# Patient Record
Sex: Male | Born: 1940
Health system: Southern US, Community
[De-identification: ages and names within clinical notes are randomized; demographics above are authoritative.]

## PROBLEM LIST (undated history)

## (undated) DIAGNOSIS — I058 Other rheumatic mitral valve diseases: Secondary | ICD-10-CM

## (undated) DIAGNOSIS — R011 Cardiac murmur, unspecified: Secondary | ICD-10-CM

## (undated) DIAGNOSIS — N189 Chronic kidney disease, unspecified: Secondary | ICD-10-CM

## (undated) DIAGNOSIS — C61 Malignant neoplasm of prostate: Secondary | ICD-10-CM

## (undated) DIAGNOSIS — Z8489 Family history of other specified conditions: Secondary | ICD-10-CM

## (undated) DIAGNOSIS — I5189 Other ill-defined heart diseases: Secondary | ICD-10-CM

## (undated) DIAGNOSIS — I429 Cardiomyopathy, unspecified: Secondary | ICD-10-CM

## (undated) DIAGNOSIS — N2889 Other specified disorders of kidney and ureter: Secondary | ICD-10-CM

## (undated) DIAGNOSIS — I350 Nonrheumatic aortic (valve) stenosis: Secondary | ICD-10-CM

## (undated) DIAGNOSIS — F0390 Unspecified dementia without behavioral disturbance: Secondary | ICD-10-CM

## (undated) DIAGNOSIS — T8203XD Leakage of heart valve prosthesis, subsequent encounter: Secondary | ICD-10-CM

## (undated) DIAGNOSIS — D589 Hereditary hemolytic anemia, unspecified: Secondary | ICD-10-CM

## (undated) DIAGNOSIS — Z953 Presence of xenogenic heart valve: Secondary | ICD-10-CM

## (undated) DIAGNOSIS — G459 Transient cerebral ischemic attack, unspecified: Secondary | ICD-10-CM

## (undated) DIAGNOSIS — I442 Atrioventricular block, complete: Secondary | ICD-10-CM

## (undated) DIAGNOSIS — E7800 Pure hypercholesterolemia, unspecified: Secondary | ICD-10-CM

## (undated) DIAGNOSIS — I779 Disorder of arteries and arterioles, unspecified: Secondary | ICD-10-CM

## (undated) DIAGNOSIS — I10 Essential (primary) hypertension: Secondary | ICD-10-CM

## (undated) DIAGNOSIS — I639 Cerebral infarction, unspecified: Secondary | ICD-10-CM

## (undated) DIAGNOSIS — R911 Solitary pulmonary nodule: Secondary | ICD-10-CM

## (undated) DIAGNOSIS — T8203XA Leakage of heart valve prosthesis, initial encounter: Secondary | ICD-10-CM

## (undated) DIAGNOSIS — I739 Peripheral vascular disease, unspecified: Secondary | ICD-10-CM

## (undated) DIAGNOSIS — I2510 Atherosclerotic heart disease of native coronary artery without angina pectoris: Secondary | ICD-10-CM

## (undated) DIAGNOSIS — R7303 Prediabetes: Secondary | ICD-10-CM

## (undated) HISTORY — DX: Cardiac murmur, unspecified: R01.1

## (undated) HISTORY — DX: Pure hypercholesterolemia, unspecified: E78.00

## (undated) HISTORY — DX: Cerebral infarction, unspecified: I63.9

## (undated) HISTORY — DX: Malignant neoplasm of prostate: C61

## (undated) HISTORY — DX: Other specified disorders of kidney and ureter: N28.89

## (undated) HISTORY — DX: Leakage of heart valve prosthesis, subsequent encounter: T82.03XD

## (undated) HISTORY — DX: Hereditary hemolytic anemia, unspecified: D58.9

## (undated) HISTORY — DX: Cardiomyopathy, unspecified: I42.9

## (undated) HISTORY — DX: Leakage of heart valve prosthesis, initial encounter: T82.03XA

## (undated) HISTORY — DX: Solitary pulmonary nodule: R91.1

## (undated) HISTORY — DX: Essential (primary) hypertension: I10

---

## 2012-01-12 DIAGNOSIS — E782 Mixed hyperlipidemia: Secondary | ICD-10-CM | POA: Diagnosis not present

## 2012-01-12 DIAGNOSIS — R011 Cardiac murmur, unspecified: Secondary | ICD-10-CM | POA: Diagnosis not present

## 2012-01-12 DIAGNOSIS — I10 Essential (primary) hypertension: Secondary | ICD-10-CM | POA: Diagnosis not present

## 2012-01-25 DIAGNOSIS — R011 Cardiac murmur, unspecified: Secondary | ICD-10-CM | POA: Diagnosis not present

## 2012-10-31 DIAGNOSIS — I10 Essential (primary) hypertension: Secondary | ICD-10-CM | POA: Diagnosis not present

## 2012-10-31 DIAGNOSIS — R413 Other amnesia: Secondary | ICD-10-CM | POA: Diagnosis not present

## 2013-01-30 DIAGNOSIS — I10 Essential (primary) hypertension: Secondary | ICD-10-CM | POA: Diagnosis not present

## 2013-01-30 DIAGNOSIS — E782 Mixed hyperlipidemia: Secondary | ICD-10-CM | POA: Diagnosis not present

## 2013-01-30 DIAGNOSIS — E1129 Type 2 diabetes mellitus with other diabetic kidney complication: Secondary | ICD-10-CM | POA: Diagnosis not present

## 2013-03-18 DIAGNOSIS — E119 Type 2 diabetes mellitus without complications: Secondary | ICD-10-CM | POA: Diagnosis not present

## 2013-05-02 DIAGNOSIS — E1129 Type 2 diabetes mellitus with other diabetic kidney complication: Secondary | ICD-10-CM | POA: Diagnosis not present

## 2013-05-02 DIAGNOSIS — I10 Essential (primary) hypertension: Secondary | ICD-10-CM | POA: Diagnosis not present

## 2013-05-02 DIAGNOSIS — R635 Abnormal weight gain: Secondary | ICD-10-CM | POA: Diagnosis not present

## 2013-05-02 DIAGNOSIS — E782 Mixed hyperlipidemia: Secondary | ICD-10-CM | POA: Diagnosis not present

## 2013-05-02 DIAGNOSIS — R413 Other amnesia: Secondary | ICD-10-CM | POA: Diagnosis not present

## 2013-08-22 DIAGNOSIS — I10 Essential (primary) hypertension: Secondary | ICD-10-CM | POA: Diagnosis not present

## 2013-08-22 DIAGNOSIS — M25569 Pain in unspecified knee: Secondary | ICD-10-CM | POA: Diagnosis not present

## 2013-08-22 DIAGNOSIS — E782 Mixed hyperlipidemia: Secondary | ICD-10-CM | POA: Diagnosis not present

## 2013-08-22 DIAGNOSIS — N529 Male erectile dysfunction, unspecified: Secondary | ICD-10-CM | POA: Diagnosis not present

## 2013-08-22 DIAGNOSIS — E119 Type 2 diabetes mellitus without complications: Secondary | ICD-10-CM | POA: Diagnosis not present

## 2014-01-23 DIAGNOSIS — E119 Type 2 diabetes mellitus without complications: Secondary | ICD-10-CM | POA: Diagnosis not present

## 2014-01-23 DIAGNOSIS — E782 Mixed hyperlipidemia: Secondary | ICD-10-CM | POA: Diagnosis not present

## 2014-01-23 DIAGNOSIS — N529 Male erectile dysfunction, unspecified: Secondary | ICD-10-CM | POA: Diagnosis not present

## 2014-01-23 DIAGNOSIS — I10 Essential (primary) hypertension: Secondary | ICD-10-CM | POA: Diagnosis not present

## 2014-05-12 DIAGNOSIS — E119 Type 2 diabetes mellitus without complications: Secondary | ICD-10-CM | POA: Diagnosis not present

## 2014-05-12 DIAGNOSIS — H2510 Age-related nuclear cataract, unspecified eye: Secondary | ICD-10-CM | POA: Diagnosis not present

## 2014-05-29 DIAGNOSIS — F17210 Nicotine dependence, cigarettes, uncomplicated: Secondary | ICD-10-CM | POA: Diagnosis not present

## 2014-05-29 DIAGNOSIS — E782 Mixed hyperlipidemia: Secondary | ICD-10-CM | POA: Diagnosis not present

## 2014-05-29 DIAGNOSIS — E119 Type 2 diabetes mellitus without complications: Secondary | ICD-10-CM | POA: Diagnosis not present

## 2014-05-29 DIAGNOSIS — I10 Essential (primary) hypertension: Secondary | ICD-10-CM | POA: Diagnosis not present

## 2015-07-07 DIAGNOSIS — I10 Essential (primary) hypertension: Secondary | ICD-10-CM | POA: Diagnosis not present

## 2015-07-07 DIAGNOSIS — Z125 Encounter for screening for malignant neoplasm of prostate: Secondary | ICD-10-CM | POA: Diagnosis not present

## 2015-07-07 DIAGNOSIS — I350 Nonrheumatic aortic (valve) stenosis: Secondary | ICD-10-CM | POA: Diagnosis not present

## 2015-07-07 DIAGNOSIS — E782 Mixed hyperlipidemia: Secondary | ICD-10-CM | POA: Diagnosis not present

## 2015-07-07 DIAGNOSIS — E139 Other specified diabetes mellitus without complications: Secondary | ICD-10-CM | POA: Diagnosis not present

## 2015-10-27 DIAGNOSIS — I10 Essential (primary) hypertension: Secondary | ICD-10-CM | POA: Diagnosis not present

## 2015-10-27 DIAGNOSIS — Z1389 Encounter for screening for other disorder: Secondary | ICD-10-CM | POA: Diagnosis not present

## 2015-10-27 DIAGNOSIS — I350 Nonrheumatic aortic (valve) stenosis: Secondary | ICD-10-CM | POA: Diagnosis not present

## 2015-10-27 DIAGNOSIS — E782 Mixed hyperlipidemia: Secondary | ICD-10-CM | POA: Diagnosis not present

## 2015-10-27 DIAGNOSIS — Z8546 Personal history of malignant neoplasm of prostate: Secondary | ICD-10-CM | POA: Diagnosis not present

## 2015-10-27 DIAGNOSIS — Z0001 Encounter for general adult medical examination with abnormal findings: Secondary | ICD-10-CM | POA: Diagnosis not present

## 2015-10-27 DIAGNOSIS — E139 Other specified diabetes mellitus without complications: Secondary | ICD-10-CM | POA: Diagnosis not present

## 2016-04-28 DIAGNOSIS — I10 Essential (primary) hypertension: Secondary | ICD-10-CM | POA: Diagnosis not present

## 2016-04-28 DIAGNOSIS — E7800 Pure hypercholesterolemia, unspecified: Secondary | ICD-10-CM | POA: Diagnosis not present

## 2016-04-28 DIAGNOSIS — E139 Other specified diabetes mellitus without complications: Secondary | ICD-10-CM | POA: Diagnosis not present

## 2016-04-28 DIAGNOSIS — Z8546 Personal history of malignant neoplasm of prostate: Secondary | ICD-10-CM | POA: Diagnosis not present

## 2016-04-28 DIAGNOSIS — R413 Other amnesia: Secondary | ICD-10-CM | POA: Diagnosis not present

## 2016-04-28 DIAGNOSIS — F17210 Nicotine dependence, cigarettes, uncomplicated: Secondary | ICD-10-CM | POA: Diagnosis not present

## 2016-04-28 DIAGNOSIS — F17200 Nicotine dependence, unspecified, uncomplicated: Secondary | ICD-10-CM | POA: Diagnosis not present

## 2016-04-28 DIAGNOSIS — I350 Nonrheumatic aortic (valve) stenosis: Secondary | ICD-10-CM | POA: Diagnosis not present

## 2016-05-16 ENCOUNTER — Ambulatory Visit: Payer: Medicare Other | Admitting: Diagnostic Neuroimaging

## 2016-05-19 DIAGNOSIS — E119 Type 2 diabetes mellitus without complications: Secondary | ICD-10-CM | POA: Diagnosis not present

## 2016-05-19 DIAGNOSIS — H5213 Myopia, bilateral: Secondary | ICD-10-CM | POA: Diagnosis not present

## 2016-06-24 ENCOUNTER — Ambulatory Visit: Payer: Medicare Other | Admitting: Diagnostic Neuroimaging

## 2016-07-19 ENCOUNTER — Encounter: Payer: Self-pay | Admitting: *Deleted

## 2016-07-20 ENCOUNTER — Encounter: Payer: Self-pay | Admitting: Diagnostic Neuroimaging

## 2016-07-20 ENCOUNTER — Ambulatory Visit (INDEPENDENT_AMBULATORY_CARE_PROVIDER_SITE_OTHER): Payer: Medicare Other | Admitting: Diagnostic Neuroimaging

## 2016-07-20 VITALS — BP 160/74 | HR 48 | Ht 67.0 in | Wt 226.8 lb

## 2016-07-20 DIAGNOSIS — R413 Other amnesia: Secondary | ICD-10-CM | POA: Diagnosis not present

## 2016-07-20 NOTE — Progress Notes (Signed)
[facility]  PATIENT: James Curtis DOB: [birth year]  REFERRING CLINICIAN: R Polite HISTORY FROM: patient and son REASON FOR VISIT: new consult    HISTORICAL  CHIEF COMPLAINT:  Chief Complaint  Patient presents with  . Memory Loss    rm 6, New Pt, sonLegrand Como, MMSE 25    HISTORY OF PRESENT ILLNESS:   75 year old male here for evaluation of memory loss. Patient denies any significant memory problems. However PCP noted some memory problems and abnormal MMSE testing. In addition patient's son also noticed short-term memory problems for past 1 year. Patient having some intermittent confusion, slow reaction, word recall difficulty. Patient got lost driving twice in the past 2 years. Patient has had problems recognizing familiar areas as well. Patient lives alone and is able to take care of most of his activities of daily living.  Patient's wife passed away in Nov 15, 2013, possibly from dementia.   REVIEW OF SYSTEMS: Full 14 system review of systems performed and negative with exception of: Blurred vision urination problems cramps aching muscles memory loss.  ALLERGIES: No Known Allergies  HOME MEDICATIONS: Outpatient Medications Prior to Visit  Medication Sig Dispense Refill  . AMLODIPINE BESYLATE PO Take 10 mg by mouth daily.    Marland Kitchen aspirin EC 81 MG tablet Take 81 mg by mouth daily.    . carvedilol (COREG) 25 MG tablet Take 25 mg by mouth 2 (two) times daily with a meal.    . cholecalciferol (VITAMIN D) 1000 units tablet Take 1,000 Units by mouth daily.    . niacin 500 MG tablet Take 500 mg by mouth at bedtime.    . simvastatin (ZOCOR) 20 MG tablet Take 20 mg by mouth daily.    . valsartan (DIOVAN) 320 MG tablet Take 320 mg by mouth daily.     No facility-administered medications prior to visit.     PAST MEDICAL HISTORY: Past Medical History:  Diagnosis Date  . Diabetes ([place])    diet controlled  . Heart murmur   . Hypercholesteremia   . Hypertension    benign  . Prostate cancer [facility])    s/p radiation    PAST SURGICAL HISTORY: History reviewed. No pertinent surgical history.  FAMILY HISTORY: History reviewed. No pertinent family history.  SOCIAL HISTORY:  Social History   Social History  . Marital status: Widowed    Spouse name: N/A  . Number of children: 2  . Years of education: 12   Occupational History  .      retired   Social History Main Topics  . Smoking status: Current Every Day Smoker    Packs/day: 0.25    Types: Cigarettes  . Smokeless tobacco: Never Used  . Alcohol use No  . Drug use: No  . Sexual activity: Not on file   Other Topics Concern  . Not on file   Social History Narrative   Lives alone   Caffeine      PHYSICAL EXAM  GENERAL EXAM/CONSTITUTIONAL: Vitals:  Vitals:   07/20/16 1057  BP: (!) 160/74  Pulse: (!) 48  Weight: 226 lb 12.8 oz (102.9 kg)  Height: 5\' 7"  (1.702 m)     Body mass index is 35.52 kg/m.  Visual Acuity Screening   Right eye Left eye Both eyes  Without correction: 20/30    With correction:     Comments: 07/20/16 blurry in left eye    Patient is in no distress; well developed, nourished and groomed; neck is supple  CARDIOVASCULAR:  Examination of carotid arteries is normal; no carotid bruits  Regular rate and rhythm, HOLOSYSTOLIC MURMUR    Examination of peripheral vascular system by observation and palpation is normal  EYES:  Ophthalmoscopic exam of optic discs and posterior segments is normal; no papilledema or hemorrhages  MUSCULOSKELETAL:  Gait, strength, tone, movements noted in Neurologic exam below  NEUROLOGIC: MENTAL STATUS:  MMSE - Mini Mental State Exam 07/20/2016  Orientation to time 5  Orientation to Place 4  Registration 3  Attention/ Calculation 5  Recall 0  Language- name 2 objects 2  Language- repeat 0  Language- follow 3 step command 3  Language- read & follow direction 1  Write a sentence 1  Copy design 1  Total score  25    awake, alert, oriented to person, place and time  recent and remote memory intact  normal attention and concentration  language fluent, comprehension intact, naming intact,   fund of knowledge appropriate  CRANIAL NERVE:   2nd - no papilledema on fundoscopic exam  2nd, 3rd, 4th, 6th - pupils equal and reactive to light, visual fields full to confrontation, extraocular muscles intact, no nystagmus  5th - facial sensation symmetric  7th - facial strength symmetric  8th - hearing intact  9th - palate elevates symmetrically, uvula midline  11th - shoulder shrug symmetric  12th - tongue protrusion midline  MOTOR:   normal bulk and tone, full strength in the BUE, BLE  SENSORY:   normal and symmetric to light touch, temperature, vibration  COORDINATION:   finger-nose-finger, fine finger movements normal  REFLEXES:   deep tendon reflexes present and symmetric  POSITIVE SNOUT AND MYERSONS REFLEXES  GAIT/STATION:   narrow based gait; DIFF WITH TANDEM    DIAGNOSTIC DATA (LABS, IMAGING, TESTING) - I reviewed patient records, labs, notes, testing and imaging myself where available.  No results found for: WBC, HGB, HCT, MCV, PLT No results found for: NA, K, CL, CO2, GLUCOSE, BUN, CREATININE, CALCIUM, PROT, ALBUMIN, AST, ALT, ALKPHOS, BILITOT, GFRNONAA, GFRAA No results found for: CHOL, HDL, LDLCALC, LDLDIRECT, TRIG, CHOLHDL No results found for: HGBA1C No results found for: VITAMINB12 No results found for: TSH      ASSESSMENT AND PLAN  75 y.o. year old male here with short-term memory loss, navigation difficulty, poor insight, suspicious for neurodegenerative dementia.   Ddx: mid cognitive impairment, mild dementia, metabolic, stroke, vascular  1. Memory loss      PLAN: - check MRI brain  - reviewed brain healthy activities - caution with safety and supervision - offered dementia research study but patient and family declined   Orders  Placed This Encounter  Procedures  . MR BRAIN WO CONTRAST   Return in about 3 months (around 10/18/2016).     Penni Bombard, MD 97/12/8830, 54:98 AM Certified in Neurology, Neurophysiology and Neuroimaging  [facility] [address], [place], [place] [address] [phone]

## 2016-08-11 ENCOUNTER — Ambulatory Visit
Admission: RE | Admit: 2016-08-11 | Discharge: 2016-08-11 | Disposition: A | Discharge status: Home / Self Care | Payer: Medicare Other | Source: Ambulatory Visit | Attending: Diagnostic Neuroimaging | Admitting: Diagnostic Neuroimaging

## 2016-08-11 DIAGNOSIS — R413 Other amnesia: Secondary | ICD-10-CM | POA: Diagnosis not present

## 2016-08-12 ENCOUNTER — Telehealth: Payer: Self-pay | Admitting: *Deleted

## 2016-08-12 NOTE — Telephone Encounter (Signed)
Per Dr Leta Baptist, LVM informing patient's son, Ronalee Belts of MRI results re: brain atrophy or shrinkage which is consistent with memory loss / dementia. Advised there are no other major findings. Dr Leta Baptist will continue with his current treatment plan. Reviewed Dr Nikki Dom plan from office note. Informed son office is now closed, reopens next Wed. Left number for any questions.

## 2016-09-06 NOTE — Telephone Encounter (Signed)
Patient's son is calling to discuss patient's MRI.

## 2016-09-06 NOTE — Telephone Encounter (Signed)
Called son and discussed MRI results per Dr Gladstone Lighter result note. He was asking questions beyond this RN's scope of practice. He requested follow up be moved sooner than March to discuss MRI with Dr Leta Baptist. Moved FU to 09/28/16. He verbalized understanding, appreciation.

## 2016-09-28 ENCOUNTER — Encounter: Payer: Self-pay | Admitting: Diagnostic Neuroimaging

## 2016-09-28 ENCOUNTER — Ambulatory Visit (INDEPENDENT_AMBULATORY_CARE_PROVIDER_SITE_OTHER): Payer: Medicare Other | Admitting: Diagnostic Neuroimaging

## 2016-09-28 VITALS — BP 138/70 | HR 52 | Wt 225.0 lb

## 2016-09-28 DIAGNOSIS — R413 Other amnesia: Secondary | ICD-10-CM

## 2016-09-28 NOTE — Progress Notes (Signed)
[facility]  PATIENT: James Curtis DOB: [birth year]  REFERRING CLINICIAN: R Polite HISTORY FROM: patient and son REASON FOR VISIT: follow up   HISTORICAL  CHIEF COMPLAINT:  Chief Complaint  Patient presents with  . Memory Loss    rm 6, sonLegrand Como, MMSE 21  . Follow-up    early FU req to review MRI    HISTORY OF PRESENT ILLNESS:   UPDATE 09/28/16: Since last visit, no sig change. MRI results reviewed.   PRIOR HPI 07/20/16: 76 year old male here for evaluation of memory loss. Patient denies any significant memory problems. However PCP noted some memory problems and abnormal MMSE testing. In addition patient's son also noticed short-term memory problems for past 1 year. Patient having some intermittent confusion, slow reaction, word recall difficulty. Patient got lost driving twice in the past 2 years. Patient has had problems recognizing familiar areas as well. Patient lives alone and is able to take care of most of his activities of daily living. Patient's wife passed away in 26-Nov-2013, possibly from dementia.   REVIEW OF SYSTEMS: Full 14 system review of systems performed and negative with exception of: Blurred vision urination problems cramps aching muscles memory loss.  ALLERGIES: No Known Allergies  HOME MEDICATIONS: Outpatient Medications Prior to Visit  Medication Sig Dispense Refill  . AMLODIPINE BESYLATE PO Take 10 mg by mouth daily.    Marland Kitchen aspirin EC 81 MG tablet Take 81 mg by mouth daily.    . carvedilol (COREG) 25 MG tablet Take 25 mg by mouth 2 (two) times daily with a meal.    . cholecalciferol (VITAMIN D) 1000 units tablet Take 1,000 Units by mouth daily.    . niacin 500 MG tablet Take 500 mg by mouth at bedtime.    . simvastatin (ZOCOR) 20 MG tablet Take 20 mg by mouth daily.    . valsartan (DIOVAN) 320 MG tablet Take 320 mg by mouth daily.     No facility-administered medications prior to visit.     PAST MEDICAL HISTORY: Past Medical  History:  Diagnosis Date  . Diabetes ([place])    diet controlled  . Heart murmur   . Hypercholesteremia   . Hypertension    benign  . Prostate cancer [facility])    s/p radiation    PAST SURGICAL HISTORY: History reviewed. No pertinent surgical history.  FAMILY HISTORY: History reviewed. No pertinent family history.  SOCIAL HISTORY:  Social History   Social History  . Marital status: Widowed    Spouse name: N/A  . Number of children: 2  . Years of education: 12   Occupational History  .      retired   Social History Main Topics  . Smoking status: Current Every Day Smoker    Packs/day: 0.25    Types: Cigarettes  . Smokeless tobacco: Never Used  . Alcohol use No  . Drug use: No  . Sexual activity: Not on file   Other Topics Concern  . Not on file   Social History Narrative   Lives alone   Caffeine      PHYSICAL EXAM  GENERAL EXAM/CONSTITUTIONAL: Vitals:  Vitals:   09/28/16 1530  BP: 138/70  Pulse: (!) 52  Weight: 225 lb (102.1 kg)   There is no height or weight on file to calculate BMI. No exam data present  Patient is in no distress; well developed, nourished and groomed; neck is supple  CARDIOVASCULAR:  Examination of carotid arteries is normal; no carotid bruits  Regular rate and rhythm, HOLOSYSTOLIC MURMUR    Examination of peripheral vascular system by observation and palpation is normal  EYES:  Ophthalmoscopic exam of optic discs and posterior segments is normal; no papilledema or hemorrhages  MUSCULOSKELETAL:  Gait, strength, tone, movements noted in Neurologic exam below  NEUROLOGIC: MENTAL STATUS:  MMSE - Mini Mental State Exam 09/28/2016 07/20/2016  Orientation to time 3 5  Orientation to Place 4 4  Registration 3 3  Attention/ Calculation 0 5  Recall 3 0  Language- name 2 objects 2 2  Language- repeat 0 0  Language- follow 3 step command 3 3  Language- read & follow direction 1 1  Write a sentence 1 1  Copy design 1 1    Total score 21 25    awake, alert, oriented to person, place and time  recent and remote memory intact  normal attention and concentration  language fluent, comprehension intact, naming intact,   fund of knowledge appropriate  CRANIAL NERVE:   2nd - no papilledema on fundoscopic exam  2nd, 3rd, 4th, 6th - pupils equal and reactive to light, visual fields full to confrontation, extraocular muscles intact, no nystagmus  5th - facial sensation symmetric  7th - facial strength symmetric  8th - hearing intact  9th - palate elevates symmetrically, uvula midline  11th - shoulder shrug symmetric  12th - tongue protrusion midline  MOTOR:   normal bulk and tone, full strength in the BUE, BLE  SENSORY:   normal and symmetric to light touch, temperature, vibration  COORDINATION:   finger-nose-finger, fine finger movements normal  REFLEXES:   deep tendon reflexes present and symmetric  POSITIVE SNOUT AND MYERSONS REFLEXES  GAIT/STATION:   narrow based gait    DIAGNOSTIC DATA (LABS, IMAGING, TESTING) - I reviewed patient records, labs, notes, testing and imaging myself where available.  No results found for: WBC, HGB, HCT, MCV, PLT No results found for: NA, K, CL, CO2, GLUCOSE, BUN, CREATININE, CALCIUM, PROT, ALBUMIN, AST, ALT, ALKPHOS, BILITOT, GFRNONAA, GFRAA No results found for: CHOL, HDL, LDLCALC, LDLDIRECT, TRIG, CHOLHDL No results found for: HGBA1C No results found for: VITAMINB12 No results found for: TSH   08/11/16 MRI brain (without) [I reviewed images myself and agree with interpretation. -VRP] 1. Moderate right and severe left mesial temporal atrophy. 2. Mild periventricular and  subcortical chronic small vessel ischemic disease.  3. No acute findings.     ASSESSMENT AND PLAN  76 y.o. year old male here with short-term memory loss, navigation difficulty, poor insight, concerning for mild neurodegenerative dementia.   Dx: mild  dementia  1. Memory loss      PLAN:  - reviewed brain healthy activities - caution with safety and supervision  Return if symptoms worsen or fail to improve, for return to PCP.     Penni Bombard, MD 4/40/3474, 2:59 PM Certified in Neurology, Neurophysiology and Neuroimaging  [facility] [address] [place], [place] [address] [phone]

## 2016-10-25 ENCOUNTER — Ambulatory Visit: Payer: Medicare Other | Admitting: Diagnostic Neuroimaging

## 2016-11-16 DIAGNOSIS — I350 Nonrheumatic aortic (valve) stenosis: Secondary | ICD-10-CM | POA: Diagnosis not present

## 2016-11-16 DIAGNOSIS — E139 Other specified diabetes mellitus without complications: Secondary | ICD-10-CM | POA: Diagnosis not present

## 2016-11-16 DIAGNOSIS — E7800 Pure hypercholesterolemia, unspecified: Secondary | ICD-10-CM | POA: Diagnosis not present

## 2016-11-16 DIAGNOSIS — R413 Other amnesia: Secondary | ICD-10-CM | POA: Diagnosis not present

## 2016-11-16 DIAGNOSIS — I10 Essential (primary) hypertension: Secondary | ICD-10-CM | POA: Diagnosis not present

## 2016-11-16 DIAGNOSIS — R001 Bradycardia, unspecified: Secondary | ICD-10-CM | POA: Diagnosis not present

## 2016-11-16 DIAGNOSIS — Z0000 Encounter for general adult medical examination without abnormal findings: Secondary | ICD-10-CM | POA: Diagnosis not present

## 2016-11-16 DIAGNOSIS — Z8546 Personal history of malignant neoplasm of prostate: Secondary | ICD-10-CM | POA: Diagnosis not present

## 2016-11-18 ENCOUNTER — Inpatient Hospital Stay (HOSPITAL_COMMUNITY): Payer: Medicare Other

## 2016-11-18 ENCOUNTER — Encounter (HOSPITAL_COMMUNITY): Payer: Self-pay | Admitting: Emergency Medicine

## 2016-11-18 ENCOUNTER — Inpatient Hospital Stay (HOSPITAL_COMMUNITY)
Admission: EM | Admit: 2016-11-18 | Discharge: 2016-11-26 | DRG: 242 | Disposition: A | Discharge status: Home / Self Care | Payer: Medicare Other | Attending: Internal Medicine | Admitting: Internal Medicine

## 2016-11-18 DIAGNOSIS — Z6834 Body mass index (BMI) 34.0-34.9, adult: Secondary | ICD-10-CM | POA: Diagnosis not present

## 2016-11-18 DIAGNOSIS — Z79899 Other long term (current) drug therapy: Secondary | ICD-10-CM | POA: Diagnosis not present

## 2016-11-18 DIAGNOSIS — Z95818 Presence of other cardiac implants and grafts: Secondary | ICD-10-CM

## 2016-11-18 DIAGNOSIS — Z923 Personal history of irradiation: Secondary | ICD-10-CM

## 2016-11-18 DIAGNOSIS — Z7982 Long term (current) use of aspirin: Secondary | ICD-10-CM | POA: Diagnosis not present

## 2016-11-18 DIAGNOSIS — I350 Nonrheumatic aortic (valve) stenosis: Secondary | ICD-10-CM

## 2016-11-18 DIAGNOSIS — I442 Atrioventricular block, complete: Secondary | ICD-10-CM | POA: Diagnosis present

## 2016-11-18 DIAGNOSIS — E7800 Pure hypercholesterolemia, unspecified: Secondary | ICD-10-CM | POA: Diagnosis present

## 2016-11-18 DIAGNOSIS — G9340 Encephalopathy, unspecified: Secondary | ICD-10-CM | POA: Diagnosis present

## 2016-11-18 DIAGNOSIS — E119 Type 2 diabetes mellitus without complications: Secondary | ICD-10-CM | POA: Diagnosis present

## 2016-11-18 DIAGNOSIS — F17210 Nicotine dependence, cigarettes, uncomplicated: Secondary | ICD-10-CM | POA: Diagnosis not present

## 2016-11-18 DIAGNOSIS — G4733 Obstructive sleep apnea (adult) (pediatric): Secondary | ICD-10-CM | POA: Diagnosis present

## 2016-11-18 DIAGNOSIS — R29810 Facial weakness: Secondary | ICD-10-CM | POA: Diagnosis not present

## 2016-11-18 DIAGNOSIS — I1310 Hypertensive heart and chronic kidney disease without heart failure, with stage 1 through stage 4 chronic kidney disease, or unspecified chronic kidney disease: Secondary | ICD-10-CM | POA: Diagnosis not present

## 2016-11-18 DIAGNOSIS — I2510 Atherosclerotic heart disease of native coronary artery without angina pectoris: Secondary | ICD-10-CM | POA: Diagnosis present

## 2016-11-18 DIAGNOSIS — I330 Acute and subacute infective endocarditis: Secondary | ICD-10-CM | POA: Diagnosis present

## 2016-11-18 DIAGNOSIS — G451 Carotid artery syndrome (hemispheric): Secondary | ICD-10-CM | POA: Diagnosis not present

## 2016-11-18 DIAGNOSIS — I083 Combined rheumatic disorders of mitral, aortic and tricuspid valves: Secondary | ICD-10-CM | POA: Diagnosis not present

## 2016-11-18 DIAGNOSIS — Z8546 Personal history of malignant neoplasm of prostate: Secondary | ICD-10-CM

## 2016-11-18 DIAGNOSIS — I340 Nonrheumatic mitral (valve) insufficiency: Secondary | ICD-10-CM | POA: Diagnosis not present

## 2016-11-18 DIAGNOSIS — R4781 Slurred speech: Secondary | ICD-10-CM | POA: Diagnosis not present

## 2016-11-18 DIAGNOSIS — E669 Obesity, unspecified: Secondary | ICD-10-CM | POA: Diagnosis present

## 2016-11-18 DIAGNOSIS — G458 Other transient cerebral ischemic attacks and related syndromes: Secondary | ICD-10-CM | POA: Diagnosis not present

## 2016-11-18 DIAGNOSIS — I361 Nonrheumatic tricuspid (valve) insufficiency: Secondary | ICD-10-CM | POA: Diagnosis not present

## 2016-11-18 DIAGNOSIS — Z01810 Encounter for preprocedural cardiovascular examination: Secondary | ICD-10-CM | POA: Diagnosis not present

## 2016-11-18 DIAGNOSIS — G459 Transient cerebral ischemic attack, unspecified: Secondary | ICD-10-CM | POA: Diagnosis present

## 2016-11-18 DIAGNOSIS — J9811 Atelectasis: Secondary | ICD-10-CM | POA: Diagnosis not present

## 2016-11-18 DIAGNOSIS — Z950 Presence of cardiac pacemaker: Secondary | ICD-10-CM | POA: Diagnosis not present

## 2016-11-18 DIAGNOSIS — I700 Atherosclerosis of aorta: Secondary | ICD-10-CM | POA: Diagnosis not present

## 2016-11-18 DIAGNOSIS — I10 Essential (primary) hypertension: Secondary | ICD-10-CM | POA: Diagnosis not present

## 2016-11-18 DIAGNOSIS — I519 Heart disease, unspecified: Secondary | ICD-10-CM | POA: Diagnosis not present

## 2016-11-18 DIAGNOSIS — I058 Other rheumatic mitral valve diseases: Secondary | ICD-10-CM

## 2016-11-18 DIAGNOSIS — F0390 Unspecified dementia without behavioral disturbance: Secondary | ICD-10-CM | POA: Diagnosis not present

## 2016-11-18 HISTORY — DX: Atrioventricular block, complete: I44.2

## 2016-11-18 HISTORY — DX: Other ill-defined heart diseases: I51.89

## 2016-11-18 HISTORY — DX: Peripheral vascular disease, unspecified: I73.9

## 2016-11-18 HISTORY — DX: Nonrheumatic aortic (valve) stenosis: I35.0

## 2016-11-18 HISTORY — DX: Other rheumatic mitral valve diseases: I05.8

## 2016-11-18 HISTORY — DX: Unspecified dementia, unspecified severity, without behavioral disturbance, psychotic disturbance, mood disturbance, and anxiety: F03.90

## 2016-11-18 HISTORY — DX: Disorder of arteries and arterioles, unspecified: I77.9

## 2016-11-18 HISTORY — DX: Atherosclerotic heart disease of native coronary artery without angina pectoris: I25.10

## 2016-11-18 HISTORY — DX: Transient cerebral ischemic attack, unspecified: G45.9

## 2016-11-18 LAB — CBC WITH DIFFERENTIAL/PLATELET
BASOS PCT: 1 %
Basophils Absolute: 0 10*3/uL (ref 0.0–0.1)
EOS ABS: 0.2 10*3/uL (ref 0.0–0.7)
Eosinophils Relative: 3 %
HCT: 35.1 % — ABNORMAL LOW (ref 39.0–52.0)
HEMOGLOBIN: 11.8 g/dL — AB (ref 13.0–17.0)
LYMPHS ABS: 1 10*3/uL (ref 0.7–4.0)
Lymphocytes Relative: 14 %
MCH: 29.4 pg (ref 26.0–34.0)
MCHC: 33.6 g/dL (ref 30.0–36.0)
MCV: 87.5 fL (ref 78.0–100.0)
Monocytes Absolute: 0.6 10*3/uL (ref 0.1–1.0)
Monocytes Relative: 7 %
NEUTROS PCT: 75 %
Neutro Abs: 5.7 10*3/uL (ref 1.7–7.7)
PLATELETS: 176 10*3/uL (ref 150–400)
RBC: 4.01 MIL/uL — ABNORMAL LOW (ref 4.22–5.81)
RDW: 14.1 % (ref 11.5–15.5)
WBC: 7.5 10*3/uL (ref 4.0–10.5)

## 2016-11-18 LAB — GLUCOSE, CAPILLARY: GLUCOSE-CAPILLARY: 168 mg/dL — AB (ref 65–99)

## 2016-11-18 LAB — COMPREHENSIVE METABOLIC PANEL
ALT: 15 U/L — ABNORMAL LOW (ref 17–63)
ANION GAP: 10 (ref 5–15)
AST: 24 U/L (ref 15–41)
Albumin: 3.9 g/dL (ref 3.5–5.0)
Alkaline Phosphatase: 65 U/L (ref 38–126)
BILIRUBIN TOTAL: 1.3 mg/dL — AB (ref 0.3–1.2)
BUN: 16 mg/dL (ref 6–20)
CHLORIDE: 106 mmol/L (ref 101–111)
CO2: 25 mmol/L (ref 22–32)
Calcium: 9.5 mg/dL (ref 8.9–10.3)
Creatinine, Ser: 1.37 mg/dL — ABNORMAL HIGH (ref 0.61–1.24)
GFR calc non Af Amer: 49 mL/min — ABNORMAL LOW (ref >60)
GFR, EST AFRICAN AMERICAN: 56 mL/min — AB (ref >60)
Glucose, Bld: 128 mg/dL — ABNORMAL HIGH (ref 65–99)
POTASSIUM: 4 mmol/L (ref 3.5–5.1)
Sodium: 141 mmol/L (ref 135–145)
TOTAL PROTEIN: 6.9 g/dL (ref 6.5–8.1)

## 2016-11-18 LAB — I-STAT TROPONIN, ED: TROPONIN I, POC: 0.02 ng/mL (ref 0.00–0.08)

## 2016-11-18 LAB — MAGNESIUM: MAGNESIUM: 2 mg/dL (ref 1.7–2.4)

## 2016-11-18 LAB — MRSA PCR SCREENING: MRSA BY PCR: NEGATIVE

## 2016-11-18 MED ORDER — TETRAHYDROZOLINE HCL 0.05 % OP SOLN: 1.0000 [drp] | Freq: Every day | OPHTHALMIC | Status: DC | PRN

## 2016-11-18 MED ORDER — NITROGLYCERIN 0.4 MG SL SUBL: 0.4000 mg | SUBLINGUAL_TABLET | SUBLINGUAL | Status: DC | PRN

## 2016-11-18 MED ORDER — SIMVASTATIN 20 MG PO TABS
20.0000 mg | ORAL_TABLET | Freq: Every day | ORAL | Status: DC
  Administered 2016-11-18 – 2016-11-25 (×8): 20 mg via ORAL
  Filled 2016-11-18 (×8): qty 1

## 2016-11-18 MED ORDER — POLYVINYL ALCOHOL 1.4 % OP SOLN
1.0000 [drp] | Freq: Every day | OPHTHALMIC | Status: DC | PRN
  Filled 2016-11-18: qty 15

## 2016-11-18 MED ORDER — SODIUM CHLORIDE 0.9 % IV SOLN
INTRAVENOUS | Status: AC
  Administered 2016-11-18: 21:00:00 via INTRAVENOUS

## 2016-11-18 MED ORDER — HEPARIN SODIUM (PORCINE) 5000 UNIT/ML IJ SOLN
5000.0000 [IU] | Freq: Three times a day (TID) | INTRAMUSCULAR | Status: DC
  Administered 2016-11-18 – 2016-11-26 (×22): 5000 [IU] via SUBCUTANEOUS
  Filled 2016-11-18 (×23): qty 1

## 2016-11-18 MED ORDER — IRBESARTAN 300 MG PO TABS
300.0000 mg | ORAL_TABLET | Freq: Every day | ORAL | Status: DC
  Administered 2016-11-19 – 2016-11-26 (×8): 300 mg via ORAL
  Filled 2016-11-18: qty 1
  Filled 2016-11-18: qty 2
  Filled 2016-11-18 (×2): qty 1
  Filled 2016-11-18: qty 2
  Filled 2016-11-18 (×3): qty 1

## 2016-11-18 MED ORDER — ONDANSETRON HCL 4 MG/2ML IJ SOLN: 4.0000 mg | Freq: Four times a day (QID) | INTRAMUSCULAR | Status: DC | PRN

## 2016-11-18 MED ORDER — HYDRALAZINE HCL 25 MG PO TABS
25.0000 mg | ORAL_TABLET | Freq: Three times a day (TID) | ORAL | Status: DC
Start: 2016-11-18 — End: 2016-11-26
  Administered 2016-11-18 – 2016-11-26 (×24): 25 mg via ORAL
  Filled 2016-11-18 (×24): qty 1

## 2016-11-18 MED ORDER — VITAMIN D 1000 UNITS PO TABS
1000.0000 [IU] | ORAL_TABLET | Freq: Every day | ORAL | Status: DC
  Administered 2016-11-19 – 2016-11-26 (×8): 1000 [IU] via ORAL
  Filled 2016-11-18 (×8): qty 1

## 2016-11-18 MED ORDER — DOBUTAMINE IN D5W 4-5 MG/ML-% IV SOLN
2.5000 ug/kg/min | INTRAVENOUS | Status: DC
  Administered 2016-11-19: 2.5 ug/kg/min via INTRAVENOUS
  Administered 2016-11-20 (×2): 7.5 ug/kg/min via INTRAVENOUS
  Filled 2016-11-18 (×3): qty 250

## 2016-11-18 MED ORDER — ASPIRIN EC 81 MG PO TBEC
81.0000 mg | DELAYED_RELEASE_TABLET | Freq: Every day | ORAL | Status: DC
  Administered 2016-11-19 – 2016-11-21 (×3): 81 mg via ORAL
  Filled 2016-11-18 (×3): qty 1

## 2016-11-18 MED ORDER — ACETAMINOPHEN 325 MG PO TABS
650.0000 mg | ORAL_TABLET | ORAL | Status: DC | PRN
Start: 2016-11-18 — End: 2016-11-21

## 2016-11-18 NOTE — ED Notes (Signed)
Heart rate remains slow and bp is high

## 2016-11-18 NOTE — ED Notes (Signed)
Cardiology at bedside to eval pt 

## 2016-11-18 NOTE — ED Notes (Signed)
Son has gone home for a while he will return around  60

## 2016-11-18 NOTE — Consult Note (Signed)
Neurology Consult Note  Reason for Consultation: Possible TIA  Requesting provider: Gareth Morgan, MD  CC: None  HPI: This is a 76 year old man who presented to the emergency department for evaluation of slurred speech. History is obtained directly from the patient but this is limited by patient's poor effort and limited recollection of events. Most of the information is obtained from detailed review of available medical records.   Of note, the patient a recent visit with his primary care provider in 11/16/16 at which time he was found to be hypotensive with heart rate in the 40s. He had been taking Coreg and this was discontinued, last dose on the morning of 4/4. This morning, his son called him and noted that the patient seemed to have slurred speech with confusion. When he came to check on the patient, he also noticed that he had right facial droop. Symptoms resolved within 10 minutes. The patient was brought to the emergency department for further evaluation. In the ED, he was noted to have no focal neurologic deficit. He was bradycardic with heart rate in the 30s. Blood pressure was elevated at 177/71. An EKG showed complete heart block and cardiology was consulted. He has been admitted by cardiology with plans for pacemaker placement on 11/21/16. MRI scan of the brain was ordered and is pending at this time. Neurologist having consulted due to concern about possible TIA.  Patient takes aspirin 81 mg and simvastatin 20 mg daily.   PMH:  Past Medical History:  Diagnosis Date  . Diabetes ([place])    diet controlled  . Heart murmur   . Hypercholesteremia   . Hypertension    benign  . Prostate cancer [facility])    s/p radiation    PSH:  Denies  Family history: Denies  Social history:  Social History   Social History  . Marital status: Widowed    Spouse name: N/A  . Number of children: 2  . Years of education: 12   Occupational History  .      retired   Social History Main Topics   . Smoking status: Current Every Day Smoker    Packs/day: 0.25    Types: Cigarettes  . Smokeless tobacco: Never Used  . Alcohol use No  . Drug use: No  . Sexual activity: Not on file   Other Topics Concern  . Not on file   Social History Narrative   Lives alone   Caffeine     Current outpatient meds: Medications reviewed and reconciled Current Meds  Medication Sig  . amLODipine (NORVASC) 10 MG tablet Take 10 mg by mouth daily.  Marland Kitchen Apoaequorin (PREVAGEN PO) Take 1 tablet by mouth daily. For Brain and Memory Health - OTC  . aspirin EC 81 MG tablet Take 81 mg by mouth daily.  . cholecalciferol (VITAMIN D) 1000 units tablet Take 1,000 Units by mouth daily.  Marland Kitchen ibuprofen (ADVIL,MOTRIN) 200 MG tablet Take 200 mg by mouth every 6 (six) hours as needed for headache (pain).  . simvastatin (ZOCOR) 20 MG tablet Take 20 mg by mouth at bedtime.   . Tetrahydrozoline HCl (VISINE OP) Place 1 drop into both eyes daily as needed (dry eyes).  . valsartan (DIOVAN) 320 MG tablet Take 320 mg by mouth daily.    Current inpatient meds: Medications reviewed and reconciled No current facility-administered medications for this encounter.    Current Outpatient Prescriptions  Medication Sig Dispense Refill  . amLODipine (NORVASC) 10 MG tablet Take 10 mg by mouth  daily.  11  . Apoaequorin (PREVAGEN PO) Take 1 tablet by mouth daily. For Brain and Memory Health - OTC    . aspirin EC 81 MG tablet Take 81 mg by mouth daily.    . cholecalciferol (VITAMIN D) 1000 units tablet Take 1,000 Units by mouth daily.    Marland Kitchen ibuprofen (ADVIL,MOTRIN) 200 MG tablet Take 200 mg by mouth every 6 (six) hours as needed for headache (pain).    . simvastatin (ZOCOR) 20 MG tablet Take 20 mg by mouth at bedtime.     . Tetrahydrozoline HCl (VISINE OP) Place 1 drop into both eyes daily as needed (dry eyes).    . valsartan (DIOVAN) 320 MG tablet Take 320 mg by mouth daily.      Allergies: No Known Allergies  ROS: As per HPI. A  full 14-point review of systems was performed and is otherwise unremarkable.  PE:  BP (!) 170/90 (BP Location: Left Arm)   Pulse (!) 36   Temp 98.1 F (36.7 C) (Oral)   Resp 14   SpO2 99%   General: WDWN, lying in ED gurney, no acute distress. AAO x4. Speech clear, no dysarthria. No aphasia. Affect is very flat. There is a paucity of spontaneous speech. Effort with the examination is somewhat limited. HEENT: Normocephalic. Neck supple without LAD. MMM, OP clear. Dentition good. Sclerae anicteric. No conjunctival injection.  CV: Regular, bradycardic, no murmur. Carotid pulses full and symmetric, no bruits. Distal pulses 2+ and symmetric.  Lungs: CTAB.  Abdomen: Soft, obese, non-distended, non-tender. Bowel sounds present x4.  Extremities: No C/C/E. Neuro:  CN: Pupils are equal and round. They are symmetrically reactive from 3-->2 mm. EOMI without nystagmus. No reported diplopia. Facial sensation is intact to light touch. Face is symmetric at rest with normal strength and mobility. Hearing is intact to conversational voice. Palate elevates symmetrically and uvula is midline. Voice is normal in tone, pitch and quality. Bilateral SCM and trapezii are 5/5. Tongue is midline with normal bulk and mobility.  Motor: Normal bulk, tone, and strength. Effort varies with the exam. No tremor or other abnormal movements. No drift.  Sensation: Intact to light touch.  DTRs: 2+, symmetric. Toes downgoing bilaterally. No pathologic reflexes.  Coordination: Finger-to-nose and heel-to-shin are slow but without dysmetria. Finger taps are normal in amplitude and speed, no decrement.    Labs:  Lab Results  Component Value Date   WBC 7.5 11/18/2016   HGB 11.8 (L) 11/18/2016   HCT 35.1 (L) 11/18/2016   PLT 176 11/18/2016   GLUCOSE 128 (H) 11/18/2016   ALT 15 (L) 11/18/2016   AST 24 11/18/2016   NA 141 11/18/2016   K 4.0 11/18/2016   CL 106 11/18/2016   CREATININE 1.37 (H) 11/18/2016   BUN 16 11/18/2016    CO2 25 11/18/2016   Magnesium 2.0 Troponin 0.02  Imaging:  There is no neuro imaging for review.   Assessment and Plan:  1. Possible TIA: There has been some concern about TIA given the reports of transient right facial droop and slurred speech earlier today. This is in the setting of complete heart block with bradycardia. Symptoms may be reflective of cerebral hypoperfusion in the setting of cardiac abnormality, though focal deficits raise concern for possible additional localized vascular pathology. Known risk factors for cerebrovascular disease include diabetes, hypertension, hypercholesterolemia, age, and tobacco abuse. MRI scan of the brain is pending. I will add MRA of the head to exclude intracranial stenosis. I will order carotid Dopplers.  TTE has been ordered and is pending. Check hemoglobin A1c and fasting lipids. Continue aspirin and statin.  2. Right facial droop: This was transient, now fully resolved. No acute issues. Follow exam.  3. Slurred speech: Transient, now fully resolved. No acute issues. Follow exam.  No family was present at the bedside at the time of my visit.

## 2016-11-18 NOTE — ED Notes (Addendum)
pts son s number [name]  [phone]  He healthcare po[facility]

## 2016-11-18 NOTE — ED Triage Notes (Signed)
Pt states went to bed feeling normal- this morning pt's son called him and he speech sounded slurred and he was confused. Pt's son came over to check on him and felt like his father had some right sided facial droop, confusion and slurred speech. Appears pt woke up this way. Symptoms have resolved and no symptoms apparent at triage.

## 2016-11-18 NOTE — ED Notes (Signed)
Dr Caryl Comes at the bedside

## 2016-11-18 NOTE — ED Notes (Signed)
The pt needs mri he is being monitored for bradycardia on the zoll.  The admitting doctor has okayed the pt to go to mri off zoll pads.  Mri will be dalayed until a rn is able to go with him

## 2016-11-18 NOTE — ED Provider Notes (Signed)
[facility] DEPT Provider Note   CSN: [id number] Arrival date & time: 11/18/16  1122     History   Chief Complaint Chief Complaint  Patient presents with  . Stroke Symptoms  . Bradycardia    HPI James Curtis is a 76 y.o. male.  HPI   Called son this AM, had slurred speech. Went to Dr on Wednesday, found BP and HR was low. Took him off his carvedilol, and wanted to continue to monitor.  Checked at home was HR in 40s, Bp 130s.  Woke up a few times with heart racing as well.  This morning felt badly. Denies chest pain, no shortness of breath, no nausea.  Felt like speech was off, slurred. Seemed confused, agitated, angry over trivial things per son. Thought the right side of face may have looked funny, the rest of face looked funny, eyes bugging out per son.  Had cardiologist in [place].  Now speech has improved.   Past Medical History:  Diagnosis Date  . Diabetes ([place])    diet controlled  . Heart murmur   . Hypercholesteremia   . Hypertension    benign  . Prostate cancer [facility])    s/p radiation    Patient Active Problem List   Diagnosis Date Noted  . CHB (complete heart block) ([place]) 11/18/2016    History reviewed. No pertinent surgical history.     Home Medications    Prior to Admission medications   Medication Sig Start Date End Date Taking? Authorizing Provider  amLODipine (NORVASC) 10 MG tablet Take 10 mg by mouth daily. 10/25/16  Yes Historical Provider, MD  Apoaequorin (PREVAGEN PO) Take 1 tablet by mouth daily. For Brain and [place]   Yes Historical Provider, MD  aspirin EC 81 MG tablet Take 81 mg by mouth daily.   Yes Historical Provider, MD  cholecalciferol (VITAMIN D) 1000 units tablet Take 1,000 Units by mouth daily.   Yes Historical Provider, MD  ibuprofen (ADVIL,MOTRIN) 200 MG tablet Take 200 mg by mouth every 6 (six) hours as needed for headache (pain).   Yes Historical Provider, MD  simvastatin (ZOCOR) 20 MG tablet Take 20 mg by mouth at  bedtime.    Yes Historical Provider, MD  Tetrahydrozoline HCl (VISINE OP) Place 1 drop into both eyes daily as needed (dry eyes).   Yes Historical Provider, MD  valsartan (DIOVAN) 320 MG tablet Take 320 mg by mouth daily.   Yes Historical Provider, MD    Family History History reviewed. No pertinent family history.  Social History Social History  Substance Use Topics  . Smoking status: Current Every Day Smoker    Packs/day: 0.25    Types: Cigarettes  . Smokeless tobacco: Never Used  . Alcohol use No     Allergies   Patient has no known allergies.   Review of Systems Review of Systems  Constitutional: Positive for fatigue. Negative for fever.  HENT: Negative for sore throat.   Eyes: Negative for visual disturbance.  Respiratory: Negative for cough and shortness of breath.   Cardiovascular: Negative for chest pain.  Gastrointestinal: Negative for abdominal pain.  Genitourinary: Negative for difficulty urinating.  Musculoskeletal: Negative for back pain and neck stiffness.  Skin: Negative for rash.  Neurological: Positive for speech difficulty (slurred speech). Negative for syncope and headaches.     Physical Exam Updated Vital Signs BP (!) 156/73   Pulse (!) 40   Temp 98.1 F (36.7 C) (Oral)   Resp 14  Wt 224 lb 13.9 oz (102 kg)   SpO2 100%   BMI 35.22 kg/m   Physical Exam  Constitutional: He is oriented to person, place, and time. He appears well-developed and well-nourished. No distress.  HENT:  Head: Normocephalic and atraumatic.  Eyes: Conjunctivae and EOM are normal.  Neck: Normal range of motion.  Cardiovascular: Regular rhythm, normal heart sounds and intact distal pulses.  Bradycardia present.  Exam reveals no gallop and no friction rub.   No murmur heard. Pulmonary/Chest: Effort normal and breath sounds normal. No respiratory distress. He has no wheezes. He has no rales.  Abdominal: Soft. He exhibits no distension. There is no tenderness. There is no  guarding.  Musculoskeletal: He exhibits no edema.  Neurological: He is alert and oriented to person, place, and time. He has normal strength. No cranial nerve deficit or sensory deficit. GCS eye subscore is 4. GCS verbal subscore is 5. GCS motor subscore is 6.  Skin: Skin is warm and dry. He is not diaphoretic.  Nursing note and vitals reviewed.    ED Treatments / Results  Labs (all labs ordered are listed, but only abnormal results are displayed) Labs Reviewed  CBC WITH DIFFERENTIAL/PLATELET - Abnormal; Notable for the following:       Result Value   RBC 4.01 (*)    Hemoglobin 11.8 (*)    HCT 35.1 (*)    All other components within normal limits  COMPREHENSIVE METABOLIC PANEL - Abnormal; Notable for the following:    Glucose, Bld 128 (*)    Creatinine, Ser 1.37 (*)    ALT 15 (*)    Total Bilirubin 1.3 (*)    GFR calc non Af Amer 49 (*)    GFR calc Af Amer 56 (*)    All other components within normal limits  GLUCOSE, CAPILLARY - Abnormal; Notable for the following:    Glucose-Capillary 168 (*)    All other components within normal limits  MRSA PCR SCREENING  MAGNESIUM  HEMOGLOBIN A1C  LIPID PANEL  BASIC METABOLIC PANEL  I-STAT TROPOININ, ED    EKG  EKG Interpretation  Date/Time:  Friday November 18 2016 11:27:12 EDT Ventricular Rate:  40 PR Interval:    QRS Duration: 130 QT Interval:  534 QTC Calculation: 435 R Axis:   -47 Text Interpretation:  Complete heart block Left axis deviation Left ventricular hypertrophy with QRS widening Cannot rule out Septal infarct , age undetermined Abnormal ECG No previous ECGs available Confirmed by [facility] MD, Junie Panning ([id number]) on 11/18/2016 9:55:18 PM       Radiology No results found.  Procedures Procedures (including critical care time)  Medications Ordered in ED Medications  aspirin EC tablet 81 mg (not administered)  cholecalciferol (VITAMIN D) tablet 1,000 Units (not administered)  simvastatin (ZOCOR) tablet 20 mg (20 mg  Oral Given 11/18/16 2137)  irbesartan (AVAPRO) tablet 300 mg (not administered)  nitroGLYCERIN (NITROSTAT) SL tablet 0.4 mg (not administered)  acetaminophen (TYLENOL) tablet 650 mg (not administered)  ondansetron (ZOFRAN) injection 4 mg (not administered)  heparin injection 5,000 Units (5,000 Units Subcutaneous Given 11/18/16 2137)  hydrALAZINE (APRESOLINE) tablet 25 mg (25 mg Oral Given 11/18/16 2137)  0.9 %  sodium chloride infusion (not administered)  DOBUTamine (DOBUTREX) infusion 4000 mcg/mL (not administered)  polyvinyl alcohol (LIQUIFILM TEARS) 1.4 % ophthalmic solution 1 drop (not administered)   CRITICAL CARE: complete heart block Performed by: Alvino Chapel   Total critical care time: 30 minutes  Critical care time was exclusive of  separately billable procedures and treating other patients.  Critical care was necessary to treat or prevent imminent or life-threatening deterioration.  Critical care was time spent personally by me on the following activities: development of treatment plan with patient and/or surrogate as well as nursing, discussions with consultants, evaluation of patient's response to treatment, examination of patient, obtaining history from patient or surrogate, ordering and performing treatments and interventions, ordering and review of laboratory studies, ordering and review of radiographic studies, pulse oximetry and re-evaluation of patient's condition.   Initial Impression / Assessment and Plan / ED Course  I have reviewed the triage vital signs and the nursing notes.  Pertinent labs & imaging results that were available during my care of the patient were reviewed by me and considered in my medical decision making (see chart for details).     76yo male with history of DM, hypertension, hyperlipidemia, prostate cancer, who presents with concern for feeling fatigued, episode of slurred speech now resolved. Patient presents bradycardic to the 30s with  complete heart block. No hypotension, no chest pain, no dyspnea, no fatigue.  Pads placed on patient and Cardiology consulted.  Labs WNL. Neuro exam normal, no sign of CVA.  Consulted cardiology.  Question episode related to bradycardia/hypoperfusion versus TIA. Will consult Neurology, order MRI.   Final Clinical Impressions(s) / ED Diagnoses   Final diagnoses:  Complete heart block ([place])  Slurred speech    New Prescriptions Current Discharge Medication List       Gareth Morgan, MD 11/18/16 2213

## 2016-11-18 NOTE — H&P (Signed)
CARDIOLOGY H & P NOTE   Patient ID: James Curtis MRN: [id number] DOB/AGE: Jun 06, 1941 76 y.o.  Admit date: 11/18/2016  Primary Physician   Kandice Hams, MD Primary Cardiologist   New to [facility] Reason for Consultation   CHB Requesting Physician  Dr. Billy Fischer   HPI: James Curtis is a 76 y.o. male with a history of HTN, HLD, current tobacco smoker and diet controlled DM presents for evaluation of confusion and slurred speech. EKG showed complete heart block and cardiology is asked for further evaluation.  History of tobacco smoking since age 37. Currently smokes half a pack a day.  The patient was seen by primary care provider 11/16/16 for yearly follow-up. Noted hypotensive and bradycardic in the 40s. His Coreg was discontinued. Last dose of Coreg Wednesday 4/4 morning. His blood pressure was 160s over 70s after worse with  30s to low 40s at home. This morning around 10 AM patient's son called him and noted slurred speech and sounded confused. Son has noted right-sided facial droop with confusion and slurred speech upon his coming back to home within a few minutes. His symptoms resolved after 10 minutes. Brought  the patient to hospital due to symptoms concerning for acute abnormality.  EKG shows complete heart block at a rate of 40. Creatinine 1.37. Point-of-care troponin 0.02. Hemoglobin 11.8.  Patient has not had intermittent palpitation lasting for few seconds for the past couple of weeks. No dizziness or syncope. Patient is very independent and retired. Denies chest pain, shortness of breath, orthopnea, PND, syncope, lower extremity edema.  Previously seen by cardiologist many years ago for murmur in [place]. Recently seen by neurologist for memory loss. MRI of brain showed no acute abnormality. Showed brain atrophy. Which is consistent with memory loss/dementia.   Past Medical History:  Diagnosis Date  . Diabetes ([place])    diet controlled  . Heart murmur   . Hypercholesteremia     . Hypertension    benign  . Prostate cancer [facility])    s/p radiation     History reviewed. No pertinent surgical history.  No Known Allergies  I have reviewed the patient's current medications     Prior to Admission medications   Medication Sig Start Date End Date Taking? Authorizing Provider  AMLODIPINE BESYLATE PO Take 10 mg by mouth daily.    Historical Provider, MD  aspirin EC 81 MG tablet Take 81 mg by mouth daily.    Historical Provider, MD  carvedilol (COREG) 25 MG tablet Take 25 mg by mouth 2 (two) times daily with a meal.    Historical Provider, MD  cholecalciferol (VITAMIN D) 1000 units tablet Take 1,000 Units by mouth daily.    Historical Provider, MD  niacin 500 MG tablet Take 500 mg by mouth at bedtime.    Historical Provider, MD  simvastatin (ZOCOR) 20 MG tablet Take 20 mg by mouth daily.    Historical Provider, MD  valsartan (DIOVAN) 320 MG tablet Take 320 mg by mouth daily.    Historical Provider, MD     Social History   Social History  . Marital status: Widowed    Spouse name: N/A  . Number of children: 2  . Years of education: 12   Occupational History  .      retired   Social History Main Topics  . Smoking status: Current Every Day Smoker    Packs/day: 0.25    Types: Cigarettes  . Smokeless tobacco: Never Used  . Alcohol use No  .  Drug use: No  . Sexual activity: Not on file   Other Topics Concern  . Not on file   Social History Narrative   Lives alone   Caffeine     Family Status  Relation Status  . Mother Deceased  . Father Deceased    ROS:  Full 14 point review of systems complete and found to be negative unless listed above.  Physical Exam: Blood pressure (!) 159/74, pulse (!) 35, temperature 98.1 F (36.7 C), temperature source Oral, resp. rate 18, SpO2 99 %.  General: Well developed, well nourished, male in no acute distress Head: Eyes PERRLA, No xanthomas. Normocephalic and atraumatic, oropharynx without edema or exudate.   Lungs: Resp regular and unlabored, CTA. Heart: RRR no s3, B1,4-7/8 systolic murmur with a single S2  Neck: Carotids were delayed No lymphadenopathy. No JVD. Abdomen: Bowel sounds present, abdomen soft and non-tender without masses or hernias noted. Msk:  No spine or cva tenderness. No weakness, no joint deformities or effusions. Extremities: No clubbing, cyanosis or edema. DP/PT/Radials 2+ and equal bilaterally. Neuro: Alert and oriented X 3. No focal deficits noted. Recall for events is terrible Psych:  Good affect, responds appropriately Skin: No rashes or lesions noted.  Labs:   Lab Results  Component Value Date   WBC 7.5 11/18/2016   HGB 11.8 (L) 11/18/2016   HCT 35.1 (L) 11/18/2016   MCV 87.5 11/18/2016   PLT 176 11/18/2016   No results for input(s): INR in the last 72 hours.  Recent Labs Lab 11/18/16 1156  NA 141  K 4.0  CL 106  CO2 25  BUN 16  CREATININE 1.37*  CALCIUM 9.5  PROT 6.9  BILITOT 1.3*  ALKPHOS 65  ALT 15*  AST 24  GLUCOSE 128*  ALBUMIN 3.9   Magnesium  Date Value Ref Range Status  11/18/2016 2.0 1.7 - 2.4 mg/dL Final   No results for input(s): CKTOTAL, CKMB, TROPONINI in the last 72 hours.  Recent Labs  11/18/16 1222  TROPIPOC 0.02     Radiology:  No results found.  ASSESSMENT AND PLAN:     1. CHB - Last dose of coreg AM of 4/4. His rate in 30s-40s with CHB despite >48 hours of last dose of BB. He did had episode of confusion, slurred speech and agitation this morning. Per son, lasted for approximately 10-15 minutes. He is at baseline since then. TSH was normal yesterday. Will get echocardiogram. Plan for Pacemaker on Monday.   2. HTN - BP elevated. Hold amlodipine. Start Hydralazine for BP.   3. Tobacco smoking - >35 pack year. Says quit yesterday.   4. HLD - Continue statin. Followed by PCP.   5. Elevated Scr - Seems like patient has CKD. May continue Valsartan. Will defer to primary.   6. Question TIA - MRI of brain  pending.   SignedLeanor Kail, PA 11/18/2016, 3:22 PM Pager [phone]  Co-Sign MD  The patient is admitted following an episode of confusion and asymmetric facies this morning noted by his son. There was also word finding difficulties. His son upon arrival to the house noted also that these cleared relatively rapidly over time. In this regard it is notable that he has an antecedent history of tachypalpitations.  He had also been noted 48 hours ago upon evaluation at his primary care to be bradycardic. Heart rates were in the 40s. Discussing with the primary care physician this afternoon last evaluation was September and all prior evaluations had heart rate  in the 18s. ECG obtained 48 hours ago demonstrated complete heart block with a relatively narrow QRS at 130 ms. ECG today demonstrates the same thing.  Physical examination suggests aortic stenosis which may be underlying his conduction system disease. We have no assessment of LV systolic function. We will obtain this. This would inform device type i.e. ICD versus CRT versa dual-chamber device. As his blood pressures are stable, we will await the results prior to proceeding. His son understands and is in agreement.  Neurologic assessment is also important given his episode. I suspect he will have atrial fibrillation giving rise to his tachypalpitations. Device will allow monitoring of his atrial arrhythmias.  Suspect the patient also has obstructive sleep apnea and would benefit from a sleep study

## 2016-11-19 ENCOUNTER — Encounter (HOSPITAL_COMMUNITY): Payer: PRIVATE HEALTH INSURANCE

## 2016-11-19 LAB — GLUCOSE, CAPILLARY
GLUCOSE-CAPILLARY: 120 mg/dL — AB (ref 65–99)
GLUCOSE-CAPILLARY: 121 mg/dL — AB (ref 65–99)
GLUCOSE-CAPILLARY: 98 mg/dL (ref 65–99)
Glucose-Capillary: 102 mg/dL — ABNORMAL HIGH (ref 65–99)
Glucose-Capillary: 120 mg/dL — ABNORMAL HIGH (ref 65–99)
Glucose-Capillary: 92 mg/dL (ref 65–99)
Glucose-Capillary: 130 mg/dL — ABNORMAL HIGH (ref 65–99)

## 2016-11-19 LAB — BASIC METABOLIC PANEL
ANION GAP: 10 (ref 5–15)
BUN: 18 mg/dL (ref 6–20)
CALCIUM: 9.3 mg/dL (ref 8.9–10.3)
CO2: 26 mmol/L (ref 22–32)
CREATININE: 1.19 mg/dL (ref 0.61–1.24)
Chloride: 107 mmol/L (ref 101–111)
GFR, EST NON AFRICAN AMERICAN: 58 mL/min — AB (ref >60)
GLUCOSE: 107 mg/dL — AB (ref 65–99)
Potassium: 3.9 mmol/L (ref 3.5–5.1)
Sodium: 143 mmol/L (ref 135–145)

## 2016-11-19 LAB — LIPID PANEL
CHOL/HDL RATIO: 3.9 ratio
Cholesterol: 106 mg/dL (ref 0–200)
HDL: 27 mg/dL — AB (ref >40)
LDL CALC: 60 mg/dL (ref 0–99)
TRIGLYCERIDES: 97 mg/dL (ref <150)
VLDL: 19 mg/dL (ref 0–40)

## 2016-11-19 NOTE — Progress Notes (Signed)
Patient returned from MRI.  Sustained no injuries, experienced no issues or problems during procedure.  Monitoring continued.

## 2016-11-19 NOTE — Progress Notes (Signed)
Neurology Progress Note  Subjective: No major overnight events reported. The patient has remained bradycardic. Overall, he actually feels better today. He says that his thinking is clearer. He has not had any focal deficits. 12 point review of systems performed and is otherwise unremarkable.  Medications reviewed and reconciled.   Pertinent meds: Aspirin 81 mg daily Simvastatin 20 mg daily  Current Meds:   Current Facility-Administered Medications:  .  acetaminophen (TYLENOL) tablet 650 mg, 650 mg, Oral, Q4H PRN, Bhavinkumar Bhagat, PA .  aspirin EC tablet 81 mg, 81 mg, Oral, Daily, Bhavinkumar Bhagat, PA, 81 mg at 11/19/16 1001 .  cholecalciferol (VITAMIN D) tablet 1,000 Units, 1,000 Units, Oral, Daily, Bhavinkumar Bhagat, PA, 1,000 Units at 11/19/16 1001 .  DOBUTamine (DOBUTREX) infusion 4000 mcg/mL, 2.5-20 mcg/kg/min, Intravenous, Titrated, Flossie Dibble, MD, Last Rate: 7.7 mL/hr at 11/19/16 1007, 5 mcg/kg/min at 11/19/16 1007 .  heparin injection 5,000 Units, 5,000 Units, Subcutaneous, Q8H, Bhavinkumar Bhagat, PA, 5,000 Units at 11/19/16 0514 .  hydrALAZINE (APRESOLINE) tablet 25 mg, 25 mg, Oral, Q8H, Bhavinkumar Bhagat, PA, 25 mg at 11/19/16 0514 .  irbesartan (AVAPRO) tablet 300 mg, 300 mg, Oral, Daily, Bhavinkumar Bhagat, PA, 300 mg at 11/19/16 1001 .  nitroGLYCERIN (NITROSTAT) SL tablet 0.4 mg, 0.4 mg, Sublingual, Q5 Min x 3 PRN, Bhavinkumar Bhagat, PA .  ondansetron (ZOFRAN) injection 4 mg, 4 mg, Intravenous, Q6H PRN, Bhavinkumar Bhagat, PA .  polyvinyl alcohol (LIQUIFILM TEARS) 1.4 % ophthalmic solution 1 drop, 1 drop, Both Eyes, Daily PRN, Deboraha Sprang, MD .  simvastatin (ZOCOR) tablet 20 mg, 20 mg, Oral, QHS, Bhavinkumar Bhagat, PA, 20 mg at 11/18/16 2137  Objective:  Temp:  [98.4 F (36.9 C)-98.8 F (37.1 C)] 98.8 F (37.1 C) (04/07 0800) Pulse Rate:  [35-52] 42 (04/07 0800) Resp:  [11-23] 18 (04/07 0800) BP: (108-210)/(60-121) 108/79 (04/07 0800) SpO2:  [93 %-100 %]  99 % (04/07 0800) Weight:  [102 kg (224 lb 13.9 oz)] 102 kg (224 lb 13.9 oz) (04/06 2100)  General: Obese [place] lying in bed in NAD. Alert, oriented x4. Speech is clear without dysarthria. Affect is bright. Comportment is normal. Speed of processing is improved today. His affect remains somewhat restricted but not nearly as flat as he was yesterday. HEENT: Neck is supple without lymphadenopathy. Mucous membranes are moist and the oropharynx is clear. Sclerae are anicteric. There is no conjunctival injection.  CV: Regular, bradycardic. He has a 7-6/7 systolic murmur heard diffusely over the precordium but loudest at the lower left sternal border. Carotid pulses are 2+ and symmetric with no bruits. Distal pulses 2+ and symmetric.  Lungs: CTAB  Neuro: MS: As noted above.  CN: Pupils are equal and reactive from 3-->2 mm bilaterally. EOMI, no nystagmus. Facial sensation is intact to light touch. Face is symmetric at rest with normal strength and mobility. Hearing is intact to conversational voice. Voice is normal in tone and quality. Palate elevates symmetrically. Uvula is midline. Bilateral SCM and trapezii are 5/5. Tongue is midline with normal bulk and mobility.  Motor: Normal bulk, tone, and strength throughout. No pronator drift. No tremor or other abnormal movements are observed.  Sensation: Intact to light touch.  DTRs: 2+, symmetric. Toes are downgoing bilaterally. No pathological reflexes.  Coordination: Finger-to-nose without dysmetria bilaterally.    Labs: Lab Results  Component Value Date   WBC 7.5 11/18/2016   HGB 11.8 (L) 11/18/2016   HCT 35.1 (L) 11/18/2016   PLT 176 11/18/2016   GLUCOSE 107 (H)  11/19/2016   CHOL 106 11/19/2016   TRIG 97 11/19/2016   HDL 27 (L) 11/19/2016   LDLCALC 60 11/19/2016   ALT 15 (L) 11/18/2016   AST 24 11/18/2016   NA 143 11/19/2016   K 3.9 11/19/2016   CL 107 11/19/2016   CREATININE 1.19 11/19/2016   BUN 18 11/19/2016   CO2 26 11/19/2016    CBC Latest Ref Rng & Units 11/18/2016  WBC 4.0 - 10.5 K/uL 7.5  Hemoglobin 13.0 - 17.0 g/dL 11.8(L)  Hematocrit 39.0 - 52.0 % 35.1(L)  Platelets 150 - 400 K/uL 176    No results found for: HGBA1C Lab Results  Component Value Date   ALT 15 (L) 11/18/2016   AST 24 11/18/2016   ALKPHOS 65 11/18/2016   BILITOT 1.3 (H) 11/18/2016    Radiology:  I have personally and independently reviewed the MRI scan of the brain without contrast from 11/18/16. This shows no evidence of acute ischemia. He has mild chronic small vessel ischemic changes in the bihemispheric white matter and in the pons. An old lacunar infarction is seen in the left pons. Mild diffuse generalized volume loss is present. This scan was directed compared to her previous MRI from 08/11/16 and appears to be unchanged.  I have personally and independently reviewed the MRA of the head without contrast from 11/18/16. There are atherosclerotic changes noted in the carotid arteries bilaterally. No significant stenosis is seen. No large vessel occlusions or aneurysms are identified. There is some motion with the scan which would preclude identification of slight abnormalities such as tiny aneurysms or minimal stenosis.  Other diagnostic studies:  TTE pending Carotid Dopplers pending  A/P:   1. Possible TIA: Workup underway. MRI scan of the head and MRA of the head did not reveal any significant pathology. Carotid Dopplers and echocardiogram are pending. Continue aspirin and statin. Symptoms may have been reflective of underlying cardiac arrhythmia. Cardiology is planning probable pacemaker placement on 11/21/16.  2. Right facial droop: Transient, fully resolved. No further issues.  3. Slurred speech: Transient, fully resolved. No further issues.  This was discussed with the patient and his son. Education was provided on the diagnosis and expected evaluation and treatment. They are in agreement with the plan as noted. They were given the  opportunity to ask any questions and these were addressed to their satisfaction.   Melba Coon, MD [facility]

## 2016-11-19 NOTE — Progress Notes (Signed)
Dr. Cristopher Peru in to evaluate patient.  Dr. Lovena Le also placed a call to son, [name], for update.  Questions answered.  Patient is stable.  Plan for pacemaker on Monday.

## 2016-11-19 NOTE — Progress Notes (Signed)
Patient transported to MRI 

## 2016-11-19 NOTE — Progress Notes (Addendum)
Progress Note  Patient Name: James Curtis Date of Encounter: 11/19/2016  Primary Cardiologist: none  Subjective   No chest pain or sob. No palpitations.  Inpatient Medications    Scheduled Meds: . aspirin EC  81 mg Oral Daily  . cholecalciferol  1,000 Units Oral Daily  . heparin  5,000 Units Subcutaneous Q8H  . hydrALAZINE  25 mg Oral Q8H  . irbesartan  300 mg Oral Daily  . simvastatin  20 mg Oral QHS   Continuous Infusions: . sodium chloride 100 mL/hr at 11/19/16 0100  . DOBUTamine 2.5 mcg/kg/min (11/19/16 0806)   PRN Meds: acetaminophen, nitroGLYCERIN, ondansetron (ZOFRAN) IV, polyvinyl alcohol   Vital Signs    Vitals:   11/19/16 0100 11/19/16 0200 11/19/16 0300 11/19/16 0400  BP: (!) 143/64 (!) 173/60 (!) 167/65 (!) 148/84  Pulse: (!) 37 (!) 36 (!) 37 (!) 36  Resp: 20 16 17 19   Temp:    98.4 F (36.9 C)  TempSrc:    Oral  SpO2: 99% 95% 99% 95%  Weight:      Height:        Intake/Output Summary (Last 24 hours) at 11/19/16 0841 Last data filed at 11/19/16 0100  Gross per 24 hour  Intake              400 ml  Output              200 ml  Net              200 ml   Filed Weights   11/18/16 2100  Weight: 224 lb 13.9 oz (102 kg)    Telemetry    nsr with CHB - Personally Reviewed  ECG     none today- Personally Reviewed  Physical Exam   GEN: No acute distress.   Neck: No JVD Cardiac: Regular bradycardia, 2/6 systolic and 1/6 diastolic murmur, rubs, or gallops.  Respiratory: Clear to auscultation bilaterally. GI: Soft, nontender, non-distended  MS: No edema; No deformity. Neuro:  Nonfocal  Psych: Normal affect   Labs    Chemistry Recent Labs Lab 11/18/16 1156 11/19/16 0323  NA 141 143  K 4.0 3.9  CL 106 107  CO2 25 26  GLUCOSE 128* 107*  BUN 16 18  CREATININE 1.37* 1.19  CALCIUM 9.5 9.3  PROT 6.9  --   ALBUMIN 3.9  --   AST 24  --   ALT 15*  --   ALKPHOS 65  --   BILITOT 1.3*  --   GFRNONAA 49* 58*  GFRAA 56* >60  ANIONGAP  10 10     Hematology Recent Labs Lab 11/18/16 1156  WBC 7.5  RBC 4.01*  HGB 11.8*  HCT 35.1*  MCV 87.5  MCH 29.4  MCHC 33.6  RDW 14.1  PLT 176    Cardiac EnzymesNo results for input(s): TROPONINI in the last 168 hours.  Recent Labs Lab 11/18/16 1222  TROPIPOC 0.02     BNPNo results for input(s): BNP, PROBNP in the last 168 hours.   DDimer No results for input(s): DDIMER in the last 168 hours.   Radiology    Mr Virgel Paling MW Contrast  Result Date: 11/19/2016 CLINICAL DATA:  76 y/o M; episode of slurred speech and right facial droop with onset today. EXAM: MRI HEAD WITHOUT CONTRAST MRA HEAD WITHOUT CONTRAST TECHNIQUE: Multiplanar, multiecho pulse sequences of the brain and surrounding structures were obtained without intravenous contrast. Angiographic images of the head were obtained using MRA  technique without contrast. COMPARISON:  08/11/2016 MRI of the brain. FINDINGS: MRI HEAD FINDINGS Moderate motion artifact degrades several sequences. Brain: No acute infarction, hemorrhage, hydrocephalus, extra-axial collection or mass lesion. Stable mild chronic microvascular ischemic changes and mild parenchymal volume loss of the brain. Stable small chronic lacunar infarct within left hemi pons. Vascular: As below. Skull and upper cervical spine: Normal marrow signal. Sinuses/Orbits: Negative. Other: None. MRA HEAD FINDINGS Internal carotid arteries: Patent. Lumen irregularity of bilateral cavernous and paraclinoid segments without high-grade stenosis is likely related to atherosclerotic calcification. Anterior cerebral arteries:  Patent. Middle cerebral arteries: Patent. Anterior communicating artery: Patent. Posterior communicating arteries: Probable diminutive left. No right identified, likely hypoplastic or absent. Posterior cerebral arteries:  Patent. Basilar artery:  Patent. Vertebral arteries:  Patent. No high-grade stenosis or aneurysm identified. Mild motion artifact, suboptimal  assessment for subtle stenosis or small 2-3 mm aneurysm. IMPRESSION: 1. No acute intracranial abnormality identified. 2. Stable mild chronic microvascular ischemic changes and mild parenchymal volume loss of the brain. Stable small chronic lacunar infarct in left hemi pons. 3. Patent circle of Willis. No high-grade stenosis, large vessel occlusion, or aneurysm identified. Electronically Signed   By: Kristine Garbe M.D.   On: 11/19/2016 00:33   Mr Brain Wo Contrast  Result Date: 11/19/2016 CLINICAL DATA:  76 y/o M; episode of slurred speech and right facial droop with onset today. EXAM: MRI HEAD WITHOUT CONTRAST MRA HEAD WITHOUT CONTRAST TECHNIQUE: Multiplanar, multiecho pulse sequences of the brain and surrounding structures were obtained without intravenous contrast. Angiographic images of the head were obtained using MRA technique without contrast. COMPARISON:  08/11/2016 MRI of the brain. FINDINGS: MRI HEAD FINDINGS Moderate motion artifact degrades several sequences. Brain: No acute infarction, hemorrhage, hydrocephalus, extra-axial collection or mass lesion. Stable mild chronic microvascular ischemic changes and mild parenchymal volume loss of the brain. Stable small chronic lacunar infarct within left hemi pons. Vascular: As below. Skull and upper cervical spine: Normal marrow signal. Sinuses/Orbits: Negative. Other: None. MRA HEAD FINDINGS Internal carotid arteries: Patent. Lumen irregularity of bilateral cavernous and paraclinoid segments without high-grade stenosis is likely related to atherosclerotic calcification. Anterior cerebral arteries:  Patent. Middle cerebral arteries: Patent. Anterior communicating artery: Patent. Posterior communicating arteries: Probable diminutive left. No right identified, likely hypoplastic or absent. Posterior cerebral arteries:  Patent. Basilar artery:  Patent. Vertebral arteries:  Patent. No high-grade stenosis or aneurysm identified. Mild motion artifact,  suboptimal assessment for subtle stenosis or small 2-3 mm aneurysm. IMPRESSION: 1. No acute intracranial abnormality identified. 2. Stable mild chronic microvascular ischemic changes and mild parenchymal volume loss of the brain. Stable small chronic lacunar infarct in left hemi pons. 3. Patent circle of Willis. No high-grade stenosis, large vessel occlusion, or aneurysm identified. Electronically Signed   By: Kristine Garbe M.D.   On: 11/19/2016 00:33    Cardiac Studies   2D echo is pending  Patient Profile     76 y.o. male admitted with altered mentation, and CHB now improved though heart block persists. 2D echo is pending  Assessment & Plan    1. CHB - his heart block persists despite being off of his beta blocker for 3 days and he will likely need PPM on Monday. Await 2D echo. He will continue low dose dobutamine. 2. Altered mental status - his MRI is non-revealing and his mentation has normalized. Will follow.  3. Tobacco abuse - he will need to consider smoking cessation. 4. Discussed all of the above issues with the patient's son.  Signed, Cristopher Peru, MD  11/19/2016, 8:41 AM  Patient ID: [name], male   DOB: [birth year], 76 y.o.   MRN: [id number]

## 2016-11-20 ENCOUNTER — Inpatient Hospital Stay (HOSPITAL_COMMUNITY): Payer: Medicare Other

## 2016-11-20 ENCOUNTER — Other Ambulatory Visit (HOSPITAL_COMMUNITY): Payer: PRIVATE HEALTH INSURANCE

## 2016-11-20 DIAGNOSIS — I442 Atrioventricular block, complete: Secondary | ICD-10-CM

## 2016-11-20 DIAGNOSIS — I361 Nonrheumatic tricuspid (valve) insufficiency: Secondary | ICD-10-CM

## 2016-11-20 LAB — ECHOCARDIOGRAM COMPLETE
AOPV: 0.42 m/s
AOVTI: 77.9 cm
AV Area VTI index: 0.83 cm2/m2
AV area mean vel ind: 0.73 cm2/m2
AV pk vel: 394 cm/s
AV vel: 1.75
AVAREAMEANV: 1.54 cm2
AVAREAVTI: 1.31 cm2
AVCELMEANRAT: 0.49
AVG: 23 mmHg
AVPG: 62 mmHg
CHL CUP AV PEAK INDEX: 0.62
CHL CUP AV VALUE AREA INDEX: 0.83
CHL CUP LVOT MV VTI INDEX: 0.79 cm2/m2
CHL CUP LVOT MV VTI: 1.65
DOP CAL AO MEAN VELOCITY: 214 cm/s
EWDT: 512 ms
FS: 27 % — AB (ref 28–44)
Height: 67 in
IV/PV OW: 0.92
LA ID, A-P, ES: 43 mm
LA diam end sys: 43 mm
LA vol A4C: 53.5 ml
LA vol index: 40.5 mL/m2
LA vol: 85 mL
LADIAMINDEX: 2.05 cm/m2
LDCA: 3.14 cm2
LV PW d: 13 mm — AB (ref 0.6–1.1)
LVOT SV: 136 mL
LVOT VTI: 43.4 cm
LVOT diameter: 20 mm
LVOT peak VTI: 0.56 cm
LVOT peak grad rest: 11 mmHg
LVOTPV: 165 cm/s
MV Dec: 512
MV M vel: 97.4
MV pk E vel: 112 m/s
MVANNULUSVTI: 82.5 cm
MVAP: 1.16 cm2
MVG: 5 mmHg
MVPG: 5 mmHg
MVPKAVEL: 160 m/s
MVSPHT: 189 ms
Valve area: 1.75 cm2
Weight: 3483.2 oz

## 2016-11-20 LAB — HEMOGLOBIN A1C
Hgb A1c MFr Bld: 6.2 % — ABNORMAL HIGH (ref 4.8–5.6)
Mean Plasma Glucose: 131 mg/dL

## 2016-11-20 MED ORDER — CHLORHEXIDINE GLUCONATE 4 % EX LIQD
60.0000 mL | Freq: Once | CUTANEOUS | Status: AC
  Administered 2016-11-21: 4 via TOPICAL
  Filled 2016-11-20: qty 60

## 2016-11-20 MED ORDER — SODIUM CHLORIDE 0.9 % IR SOLN
80.0000 mg | Status: AC
  Administered 2016-11-21: 80 mg
  Filled 2016-11-20: qty 2

## 2016-11-20 MED ORDER — CEFAZOLIN SODIUM-DEXTROSE 2-4 GM/100ML-% IV SOLN
2.0000 g | INTRAVENOUS | Status: DC
  Filled 2016-11-20: qty 100

## 2016-11-20 MED ORDER — SODIUM CHLORIDE 0.9% FLUSH: 3.0000 mL | INTRAVENOUS | Status: DC | PRN

## 2016-11-20 MED ORDER — SODIUM CHLORIDE 0.9 % IV SOLN
250.0000 mL | INTRAVENOUS | Status: DC
  Administered 2016-11-21: 10 mL via INTRAVENOUS

## 2016-11-20 MED ORDER — SODIUM CHLORIDE 0.9% FLUSH
3.0000 mL | Freq: Two times a day (BID) | INTRAVENOUS | Status: DC
  Administered 2016-11-20 (×2): 3 mL via INTRAVENOUS

## 2016-11-20 MED ORDER — CHLORHEXIDINE GLUCONATE 4 % EX LIQD
60.0000 mL | Freq: Once | CUTANEOUS | Status: AC
  Administered 2016-11-20: 4 via TOPICAL

## 2016-11-20 MED ORDER — SODIUM CHLORIDE 0.9 % IV SOLN
INTRAVENOUS | Status: DC
  Administered 2016-11-21: 50 mL via INTRAVENOUS

## 2016-11-20 NOTE — Progress Notes (Signed)
  Echocardiogram 2D Echocardiogram has been performed.  Jennette Dubin 11/20/2016, 4:41 PM

## 2016-11-20 NOTE — Progress Notes (Signed)
Progress Note  Patient Name: James Curtis Date of Encounter: 11/20/2016  Primary Cardiologist: none  Subjective   Denies chest pain or sob.   Inpatient Medications    Scheduled Meds: . aspirin EC  81 mg Oral Daily  . [START ON 11/21/2016]  ceFAZolin (ANCEF) IV  2 g Intravenous On Call  . chlorhexidine  60 mL Topical Once  . chlorhexidine  60 mL Topical Once  . cholecalciferol  1,000 Units Oral Daily  . [START ON 11/21/2016] gentamicin irrigation  80 mg Irrigation On Call  . heparin  5,000 Units Subcutaneous Q8H  . hydrALAZINE  25 mg Oral Q8H  . irbesartan  300 mg Oral Daily  . simvastatin  20 mg Oral QHS  . sodium chloride flush  3 mL Intravenous Q12H   Continuous Infusions: . sodium chloride    . sodium chloride    . DOBUTamine 7.516 mcg/kg/min (11/20/16 0800)   PRN Meds: acetaminophen, nitroGLYCERIN, ondansetron (ZOFRAN) IV, polyvinyl alcohol, sodium chloride flush   Vital Signs    Vitals:   11/20/16 0500 11/20/16 0534 11/20/16 0600 11/20/16 0751  BP: (!) 168/131 (!) 141/49 (!) 163/69 (!) 172/72  Pulse: (!) 42  (!) 41 (!) 44  Resp: 18  (!) 24 18  Temp:    97.8 F (36.6 C)  TempSrc:    Oral  SpO2: 97%  96% 96%  Weight:      Height:        Intake/Output Summary (Last 24 hours) at 11/20/16 0906 Last data filed at 11/20/16 0800  Gross per 24 hour  Intake           965.45 ml  Output             1575 ml  Net          -609.55 ml   Filed Weights   11/18/16 2100 11/20/16 0320  Weight: 224 lb 13.9 oz (102 kg) 217 lb 11.2 oz (98.7 kg)    Telemetry    NSR with CHB - Personally Reviewed  ECG    None today   Physical Exam   GEN: No acute distress.   Neck: No JVD Cardiac: RRR, no murmurs, rubs, or gallops.  Respiratory: Clear to auscultation bilaterally. GI: Soft, nontender, non-distended  MS: No edema; No deformity. Neuro:  Nonfocal  Psych: Normal affect   Labs    Chemistry Recent Labs Lab 11/18/16 1156 11/19/16 0323  NA 141 143  K 4.0 3.9    CL 106 107  CO2 25 26  GLUCOSE 128* 107*  BUN 16 18  CREATININE 1.37* 1.19  CALCIUM 9.5 9.3  PROT 6.9  --   ALBUMIN 3.9  --   AST 24  --   ALT 15*  --   ALKPHOS 65  --   BILITOT 1.3*  --   GFRNONAA 49* 58*  GFRAA 56* >60  ANIONGAP 10 10     Hematology Recent Labs Lab 11/18/16 1156  WBC 7.5  RBC 4.01*  HGB 11.8*  HCT 35.1*  MCV 87.5  MCH 29.4  MCHC 33.6  RDW 14.1  PLT 176    Cardiac EnzymesNo results for input(s): TROPONINI in the last 168 hours.  Recent Labs Lab 11/18/16 1222  TROPIPOC 0.02     BNPNo results for input(s): BNP, PROBNP in the last 168 hours.   DDimer No results for input(s): DDIMER in the last 168 hours.   Radiology    Mr [facility] Contrast  Result  Date: 11/19/2016 CLINICAL DATA:  76 y/o M; episode of slurred speech and right facial droop with onset today. EXAM: MRI HEAD WITHOUT CONTRAST MRA HEAD WITHOUT CONTRAST TECHNIQUE: Multiplanar, multiecho pulse sequences of the brain and surrounding structures were obtained without intravenous contrast. Angiographic images of the head were obtained using MRA technique without contrast. COMPARISON:  08/11/2016 MRI of the brain. FINDINGS: MRI HEAD FINDINGS Moderate motion artifact degrades several sequences. Brain: No acute infarction, hemorrhage, hydrocephalus, extra-axial collection or mass lesion. Stable mild chronic microvascular ischemic changes and mild parenchymal volume loss of the brain. Stable small chronic lacunar infarct within left hemi pons. Vascular: As below. Skull and upper cervical spine: Normal marrow signal. Sinuses/Orbits: Negative. Other: None. MRA HEAD FINDINGS Internal carotid arteries: Patent. Lumen irregularity of bilateral cavernous and paraclinoid segments without high-grade stenosis is likely related to atherosclerotic calcification. Anterior cerebral arteries:  Patent. Middle cerebral arteries: Patent. Anterior communicating artery: Patent. Posterior communicating arteries: Probable  diminutive left. No right identified, likely hypoplastic or absent. Posterior cerebral arteries:  Patent. Basilar artery:  Patent. Vertebral arteries:  Patent. No high-grade stenosis or aneurysm identified. Mild motion artifact, suboptimal assessment for subtle stenosis or small 2-3 mm aneurysm. IMPRESSION: 1. No acute intracranial abnormality identified. 2. Stable mild chronic microvascular ischemic changes and mild parenchymal volume loss of the brain. Stable small chronic lacunar infarct in left hemi pons. 3. Patent circle of Willis. No high-grade stenosis, large vessel occlusion, or aneurysm identified. Electronically Signed   By: Kristine Garbe M.D.   On: 11/19/2016 00:33   Mr Brain Wo Contrast  Result Date: 11/19/2016 CLINICAL DATA:  76 y/o M; episode of slurred speech and right facial droop with onset today. EXAM: MRI HEAD WITHOUT CONTRAST MRA HEAD WITHOUT CONTRAST TECHNIQUE: Multiplanar, multiecho pulse sequences of the brain and surrounding structures were obtained without intravenous contrast. Angiographic images of the head were obtained using MRA technique without contrast. COMPARISON:  08/11/2016 MRI of the brain. FINDINGS: MRI HEAD FINDINGS Moderate motion artifact degrades several sequences. Brain: No acute infarction, hemorrhage, hydrocephalus, extra-axial collection or mass lesion. Stable mild chronic microvascular ischemic changes and mild parenchymal volume loss of the brain. Stable small chronic lacunar infarct within left hemi pons. Vascular: As below. Skull and upper cervical spine: Normal marrow signal. Sinuses/Orbits: Negative. Other: None. MRA HEAD FINDINGS Internal carotid arteries: Patent. Lumen irregularity of bilateral cavernous and paraclinoid segments without high-grade stenosis is likely related to atherosclerotic calcification. Anterior cerebral arteries:  Patent. Middle cerebral arteries: Patent. Anterior communicating artery: Patent. Posterior communicating arteries:  Probable diminutive left. No right identified, likely hypoplastic or absent. Posterior cerebral arteries:  Patent. Basilar artery:  Patent. Vertebral arteries:  Patent. No high-grade stenosis or aneurysm identified. Mild motion artifact, suboptimal assessment for subtle stenosis or small 2-3 mm aneurysm. IMPRESSION: 1. No acute intracranial abnormality identified. 2. Stable mild chronic microvascular ischemic changes and mild parenchymal volume loss of the brain. Stable small chronic lacunar infarct in left hemi pons. 3. Patent circle of Willis. No high-grade stenosis, large vessel occlusion, or aneurysm identified. Electronically Signed   By: Kristine Garbe M.D.   On: 11/19/2016 00:33    Cardiac Studies   None, await echo  Patient Profile     76 y.o. male admitted with CHB  Assessment & Plan    1. CHB - he remains in CHB. Plan for PPM tomorrow. Continue dobutamine 2. Altered mentation - this has resolved.  3. Tobacco abuse - smoking cessation  Signed, Cristopher Peru, MD  11/20/2016,  9:06 AM  Patient ID: James Curtis, male   DOB: 1941/03/27, 76 y.o.   MRN: [id number]

## 2016-11-20 NOTE — Progress Notes (Signed)
VASCULAR LAB PRELIMINARY  PRELIMINARY  PRELIMINARY  PRELIMINARY  Carotid duplex completed.    Preliminary report:  1-39% ICA stenosis.  Vertebral artery flow is antegrade.   Tyrena Gohr, RVT 11/20/2016, 4:59 PM

## 2016-11-21 ENCOUNTER — Encounter (HOSPITAL_COMMUNITY): Payer: Self-pay | Admitting: Cardiology

## 2016-11-21 ENCOUNTER — Encounter (HOSPITAL_COMMUNITY)
Admission: EM | Disposition: A | Discharge status: Home / Self Care | Payer: Self-pay | Source: Home / Self Care | Attending: Internal Medicine

## 2016-11-21 DIAGNOSIS — G459 Transient cerebral ischemic attack, unspecified: Secondary | ICD-10-CM | POA: Diagnosis present

## 2016-11-21 DIAGNOSIS — I330 Acute and subacute infective endocarditis: Secondary | ICD-10-CM | POA: Insufficient documentation

## 2016-11-21 DIAGNOSIS — G451 Carotid artery syndrome (hemispheric): Secondary | ICD-10-CM

## 2016-11-21 DIAGNOSIS — Z950 Presence of cardiac pacemaker: Secondary | ICD-10-CM | POA: Diagnosis not present

## 2016-11-21 DIAGNOSIS — I10 Essential (primary) hypertension: Secondary | ICD-10-CM | POA: Diagnosis present

## 2016-11-21 HISTORY — PX: PACEMAKER IMPLANT: EP1218

## 2016-11-21 LAB — VAS US CAROTID
LEFT ECA DIAS: -11 cm/s
LEFT VERTEBRAL DIAS: -13 cm/s
LICAPDIAS: -29 cm/s
Left CCA dist dias: -10 cm/s
Left CCA dist sys: -86 cm/s
Left CCA prox dias: 16 cm/s
Left CCA prox sys: 125 cm/s
Left ICA dist dias: -6 cm/s
Left ICA dist sys: -57 cm/s
Left ICA prox sys: -122 cm/s
RCCAPDIAS: -16 cm/s
RIGHT ECA DIAS: -5 cm/s
RIGHT VERTEBRAL DIAS: -16 cm/s
Right CCA prox sys: -105 cm/s
Right cca dist sys: -67 cm/s

## 2016-11-21 LAB — SURGICAL PCR SCREEN
MRSA, PCR: NEGATIVE
STAPHYLOCOCCUS AUREUS: NEGATIVE

## 2016-11-21 LAB — PROTIME-INR
INR: 1.18
Prothrombin Time: 15 seconds (ref 11.4–15.2)

## 2016-11-21 SURGERY — PACEMAKER IMPLANT
Anesthesia: LOCAL

## 2016-11-21 MED ORDER — FENTANYL CITRATE (PF) 100 MCG/2ML IJ SOLN
INTRAMUSCULAR | Status: DC | PRN
  Administered 2016-11-21: 25 ug via INTRAVENOUS

## 2016-11-21 MED ORDER — HEPARIN (PORCINE) IN NACL 2-0.9 UNIT/ML-% IJ SOLN
INTRAMUSCULAR | Status: AC
  Filled 2016-11-21: qty 500

## 2016-11-21 MED ORDER — CEFAZOLIN IN D5W 1 GM/50ML IV SOLN
1.0000 g | Freq: Four times a day (QID) | INTRAVENOUS | Status: AC
  Administered 2016-11-21 – 2016-11-22 (×3): 1 g via INTRAVENOUS
  Filled 2016-11-21 (×3): qty 50

## 2016-11-21 MED ORDER — MIDAZOLAM HCL 5 MG/5ML IJ SOLN
INTRAMUSCULAR | Status: DC | PRN
  Administered 2016-11-21: 1 mg via INTRAVENOUS

## 2016-11-21 MED ORDER — CEFAZOLIN SODIUM-DEXTROSE 2-3 GM-% IV SOLR
INTRAVENOUS | Status: DC | PRN
  Administered 2016-11-21: 2 g via INTRAVENOUS

## 2016-11-21 MED ORDER — LIDOCAINE HCL (PF) 1 % IJ SOLN
INTRAMUSCULAR | Status: DC | PRN
  Administered 2016-11-21: 48 mL

## 2016-11-21 MED ORDER — ACETAMINOPHEN 325 MG PO TABS: 325.0000 mg | ORAL_TABLET | ORAL | Status: DC | PRN

## 2016-11-21 MED ORDER — ONDANSETRON HCL 4 MG/2ML IJ SOLN: 4.0000 mg | Freq: Four times a day (QID) | INTRAMUSCULAR | Status: DC | PRN

## 2016-11-21 MED ORDER — HEPARIN (PORCINE) IN NACL 2-0.9 UNIT/ML-% IJ SOLN
INTRAMUSCULAR | Status: DC | PRN
  Administered 2016-11-21: 1000 mL

## 2016-11-21 MED ORDER — FENTANYL CITRATE (PF) 100 MCG/2ML IJ SOLN
INTRAMUSCULAR | Status: AC
Start: 2016-11-21 — End: 2016-11-21
  Filled 2016-11-21: qty 2

## 2016-11-21 MED ORDER — LIDOCAINE HCL (PF) 1 % IJ SOLN
INTRAMUSCULAR | Status: AC
  Filled 2016-11-21: qty 60

## 2016-11-21 MED ORDER — GENTAMICIN SULFATE 40 MG/ML IJ SOLN
INTRAMUSCULAR | Status: AC
  Filled 2016-11-21: qty 2

## 2016-11-21 MED ORDER — MIDAZOLAM HCL 5 MG/5ML IJ SOLN
INTRAMUSCULAR | Status: AC
  Filled 2016-11-21: qty 5

## 2016-11-21 MED ORDER — ASPIRIN EC 325 MG PO TBEC
325.0000 mg | DELAYED_RELEASE_TABLET | Freq: Every day | ORAL | Status: DC
  Administered 2016-11-22 – 2016-11-26 (×5): 325 mg via ORAL
  Filled 2016-11-21 (×5): qty 1

## 2016-11-21 MED ORDER — CEFAZOLIN SODIUM-DEXTROSE 2-4 GM/100ML-% IV SOLN
INTRAVENOUS | Status: AC
  Filled 2016-11-21: qty 100

## 2016-11-21 SURGICAL SUPPLY — 8 items
CABLE SURGICAL S-101-97-12 (CABLE) ×2 IMPLANT
IPG PACE AZUR XT DR MRI W1DR01 (Pacemaker) ×1 IMPLANT
LEAD CAPSURE NOVUS 5076-52CM (Lead) ×2 IMPLANT
LEAD CAPSURE NOVUS 5076-58CM (Lead) ×2 IMPLANT
PACE AZURE XT DR MRI W1DR01 (Pacemaker) ×2 IMPLANT
PAD DEFIB LIFELINK (PAD) ×2 IMPLANT
SHEATH CLASSIC 7F (SHEATH) ×4 IMPLANT
TRAY PACEMAKER INSERTION (PACKS) ×2 IMPLANT

## 2016-11-21 NOTE — Progress Notes (Signed)
Patient arrived to 2W room 34.  Patient oriented to unit and room to include call light and phone.  Telemetry monitor was applied and CCMD notified.  Will continue to monitor.

## 2016-11-21 NOTE — Progress Notes (Signed)
STROKE TEAM PROGRESS NOTE   HISTORY OF PRESENT ILLNESS (per record) This is a 76 year old man who presented to the emergency department for evaluation of slurred speech. History is obtained directly from the patient but this is limited by patient's poor effort and limited recollection of events. Most of the information is obtained from detailed review of available medical records.   Of note, the patient a recent visit with his primary care provider in 11/16/16 at which time he was found to be hypotensive with heart rate in the 40s. He had been taking Coreg and this was discontinued, last dose on the morning of 4/4. The morning of 11/18/2016, his son called him and noted that the patient seemed to have slurred speech with confusion. When he came to check on the patient, he also noticed that he had right facial droop. Symptoms resolved within 10 minutes. The patient was brought to the emergency department for further evaluation. In the ED, he was noted to have no focal neurologic deficit. He was bradycardic with heart rate in the 30s. Blood pressure was elevated at 177/71. An EKG showed complete heart block and cardiology was consulted. He has been admitted by cardiology with plans for pacemaker placement on 11/21/16. MRI scan of the brain was ordered and is pending at this time. Neurologist having consulted due to concern about possible TIA. Patient was not administered IV t-PA.   Patient takes aspirin 81 mg and simvastatin 20 mg daily. He was followed by neurology after admission. MRI was negative for acute stroke. MRA was unrevealing. Stroke workup was ordered. He was handed over to stroke team 11/21/2016.    SUBJECTIVE (INTERVAL HISTORY) No family is at bedside. Pt got pacemaker today. He feels fine. No neuro deficit.    OBJECTIVE Temp:  [98 F (36.7 C)-98.5 F (36.9 C)] 98.5 F (36.9 C) (04/09 1000) Pulse Rate:  [0-160] 59 (04/09 1143) Cardiac Rhythm: A-V Sequential paced (04/09 1010) Resp:   [0-25] 20 (04/09 1000) BP: (119-188)/(44-153) 163/75 (04/09 1143) SpO2:  [0 %-100 %] 100 % (04/09 1034)  CBC:  Recent Labs Lab 11/18/16 1156  WBC 7.5  NEUTROABS 5.7  HGB 11.8*  HCT 35.1*  MCV 87.5  PLT 790    Basic Metabolic Panel:  Recent Labs Lab 11/18/16 1156 11/19/16 0323  NA 141 143  K 4.0 3.9  CL 106 107  CO2 25 26  GLUCOSE 128* 107*  BUN 16 18  CREATININE 1.37* 1.19  CALCIUM 9.5 9.3  MG 2.0  --     Lipid Panel:    Component Value Date/Time   CHOL 106 11/19/2016 0323   TRIG 97 11/19/2016 0323   HDL 27 (L) 11/19/2016 0323   CHOLHDL 3.9 11/19/2016 0323   VLDL 19 11/19/2016 0323   LDLCALC 60 11/19/2016 0323   HgbA1c:  Lab Results  Component Value Date   HGBA1C 6.2 (H) 11/19/2016   Urine Drug Screen: No results found for: LABOPIA, COCAINSCRNUR, LABBENZ, AMPHETMU, THCU, LABBARB  Alcohol Level No results found for: [place] I have personally reviewed the radiological images below and agree with the radiology interpretations.  Mr Brain 22 Contrast [name] Head Wo Contrast 11/19/2016 1. No acute intracranial abnormality identified. 2. Stable mild chronic microvascular ischemic changes and mild parenchymal volume loss of the brain. Stable small chronic lacunar infarct in left hemi pons. 3. Patent circle of Willis. No high-grade stenosis, large vessel occlusion, or aneurysm identified.   2D Echocardiogram  Impressions:   Hyperdynamic LVEF.  Moderate aortic stenosis.   Moderate mitral stenosis. The mitral valve is severely thickened and calcified with restricted leaflet opening. There are severe mitral annular calcifications predominantly posterior. There is a mobile echodensity attached to the posterior mitral valve leaflet/annulus from the left atrial side measuring 12 x 7 mm. This might represent a vegetation vs detached calcifications. A TEE is recommended for further evaluation.  Carotid Doppler   There is 1-39% bilateral ICA stenosis. Vertebral artery  flow is antegrade.     PHYSICAL EXAM  Temp:  [97.6 F (36.4 C)-98.5 F (36.9 C)] 97.6 F (36.4 C) (04/09 2031) Pulse Rate:  [0-160] 70 (04/09 2045) Resp:  [0-25] 20 (04/09 2031) BP: (119-191)/(60-98) 169/87 (04/09 2045) SpO2:  [0 %-100 %] 96 % (04/09 2031)  General - Well nourished, well developed, in no apparent distress.  Ophthalmologic - Fundi not visualized due to noncooperation.  Cardiovascular - Regular rate and rhythm.  Mental Status -  Level of arousal and orientation to time, place, and person were intact. Language including expression, naming, repetition, comprehension was assessed and found intact.  Cranial Nerves II - XII - II - Visual field intact OU. III, IV, VI - Extraocular movements intact. V - Facial sensation intact bilaterally. VII - Facial movement intact bilaterally. VIII - Hearing & vestibular intact bilaterally. X - Palate elevates symmetrically. XI - Chin turning & shoulder shrug intact bilaterally. XII - Tongue protrusion intact.  Motor Strength - The patient's strength was normal in all extremities and pronator drift was absent.  Bulk was normal and fasciculations were absent.   Motor Tone - Muscle tone was assessed at the neck and appendages and was normal.  Reflexes - The patient's reflexes were 1+ in all extremities and he had no pathological reflexes.  Sensory - Light touch, temperature/pinprick were assessed and were symmetrical.    Coordination - The patient had normal movements in the hands with no ataxia or dysmetria.  Tremor was absent.  Gait and Station - deferred.   ASSESSMENT/PLAN Mr. Lamonte Hartt is a 76 y.o. male with history of HTN, HLD, tobacco smoker and diet-controlled DB presenting with slurred speech, confusion and R facial droop. He did not receive IV t-PA.   L brain TIA, etiology unclear, could be due to mitral valve mobile structure vs. Complete heart block  MRI  No acute stroke  MRA  Unremarkable   CUS  unremarkable  2D Echo EF 65-70%. Moderate AS. Moderate MS. Mobile echodensity posterior mitral valve leaflet/annulus.   Recommend TEE for further evaluation of MV mobile echodensity.  LDL 60  HgbA1c 6.2  Heparin 5000 units sq tid for VTE prophylaxis  Diet Heart Room service appropriate? Yes; Fluid consistency: Thin  aspirin 81 mg daily prior to admission, now on aspirin 325 mg daily  Patient counseled to be compliant with his antithrombotic medications  Ongoing aggressive stroke risk factor management  Therapy recommendations:  pending   Disposition:  pending   Complete heart block  Confirmed on EKG  Cardiology on board  s/p pacemaker today  Hypertension  Stable Permissive hypertension (OK if < 220/120) but gradually normalize in 5-7 days Long-term BP goal normotensive  Hyperlipidemia  Home meds:  zocor 20, resumed in hospital  LDL 60, goal < 70  Continue statin at discharge  Diabetes  HgbA1c 6.2, goal < 7.0  Controlled  Tobacco abuse  Current smoker  Smoking cessation counseling provided  Pt is willing to quit  Other Stroke Risk Factors  Advanced age  Obesity,  Body mass index is 34.1 kg/m., recommend weight loss, diet and exercise as appropriate   Other Active Problems  Encephalopathy    Hospital day # 3  Rosalin Hawking, MD PhD Stroke Neurology 11/21/2016 9:35 PM    To contact Stroke Continuity provider, please refer to [email or web address] After hours, contact General Neurology

## 2016-11-21 NOTE — Progress Notes (Signed)
Patient's son and POA requested that patient not have carb mod diet so patient will not be given artificial sweeteners.

## 2016-11-21 NOTE — Progress Notes (Signed)
Progress Note  Patient Name: James Curtis Date of Encounter: 11/21/2016  Primary Cardiologist: none  Subjective   Feeling well. No CP or SOB.  Inpatient Medications    Scheduled Meds: . aspirin EC  81 mg Oral Daily  .  ceFAZolin (ANCEF) IV  2 g Intravenous On Call  . cholecalciferol  1,000 Units Oral Daily  . gentamicin irrigation  80 mg Irrigation On Call  . heparin  5,000 Units Subcutaneous Q8H  . hydrALAZINE  25 mg Oral Q8H  . irbesartan  300 mg Oral Daily  . simvastatin  20 mg Oral QHS  . sodium chloride flush  3 mL Intravenous Q12H   Continuous Infusions: . sodium chloride 50 mL (11/21/16 0600)  . sodium chloride 10 mL (11/21/16 0537)  . DOBUTamine 7.5 mcg/kg/min (11/20/16 2350)   PRN Meds: acetaminophen, nitroGLYCERIN, ondansetron (ZOFRAN) IV, polyvinyl alcohol, sodium chloride flush   Vital Signs    Vitals:   11/21/16 0400 11/21/16 0416 11/21/16 0533 11/21/16 0600  BP:  (!) 150/66 137/82 (!) 188/69  Pulse: (!) 47 (!) 44  (!) 45  Resp: (!) 23 17  15   Temp:  98.2 F (36.8 C)    TempSrc:  Oral    SpO2: 98% 95%  97%  Weight:      Height:        Intake/Output Summary (Last 24 hours) at 11/21/16 0723 Last data filed at 11/21/16 0600  Gross per 24 hour  Intake           448.33 ml  Output             1250 ml  Net          -801.67 ml   Filed Weights   11/18/16 2100 11/20/16 0320  Weight: 224 lb 13.9 oz (102 kg) 217 lb 11.2 oz (98.7 kg)    Telemetry    NSR with CHB - Personally Reviewed  ECG    None today   Physical Exam   GEN: No acute distress.   Neck: No JVD Cardiac: RRR, no murmurs, rubs, or gallops.  Respiratory: Clear to auscultation bilaterally. GI: Soft, nontender, non-distended  MS: No edema; No deformity. Neuro:  Nonfocal  Psych: Normal affect   Labs    Chemistry  Recent Labs Lab 11/18/16 1156 11/19/16 0323  NA 141 143  K 4.0 3.9  CL 106 107  CO2 25 26  GLUCOSE 128* 107*  BUN 16 18  CREATININE 1.37* 1.19  CALCIUM  9.5 9.3  PROT 6.9  --   ALBUMIN 3.9  --   AST 24  --   ALT 15*  --   ALKPHOS 65  --   BILITOT 1.3*  --   GFRNONAA 49* 58*  GFRAA 56* >60  ANIONGAP 10 10     Hematology  Recent Labs Lab 11/18/16 1156  WBC 7.5  RBC 4.01*  HGB 11.8*  HCT 35.1*  MCV 87.5  MCH 29.4  MCHC 33.6  RDW 14.1  PLT 176    Cardiac EnzymesNo results for input(s): TROPONINI in the last 168 hours.   Recent Labs Lab 11/18/16 1222  TROPIPOC 0.02     BNPNo results for input(s): BNP, PROBNP in the last 168 hours.   DDimer No results for input(s): DDIMER in the last 168 hours.   Radiology    No results found.  Cardiac Studies   TTE - Left ventricle: The cavity size was normal. There was mild   concentric hypertrophy. Systolic  function was vigorous. The   estimated ejection fraction was in the range of 65% to 70%. Wall   motion was normal; there were no regional wall motion   abnormalities. Doppler parameters are consistent with abnormal   left ventricular relaxation (grade 1 diastolic dysfunction). - Aortic valve: Valve mobility was restricted. There was moderate   stenosis. Mean gradient (S): 26 mm Hg. Peak gradient (S): 62 mm   Hg. Valve area (VTI): 1.75 cm^2. Valve area (Vmax): 1.31 cm^2.   Valve area (Vmean): 1.54 cm^2. - Aortic root: The aortic root was normal in size. - Mitral valve: Severely calcified annulus. Severely thickened,   moderately calcified leaflets . Leaflet separation was mildly   reduced. The findings are consistent with moderate stenosis.   There was mild regurgitation. Valve area by pressure half-time:   1.16 cm^2. Valve area by continuity equation (using LVOT flow):   1.65 cm^2. - Left atrium: The atrium was mildly dilated. - Right ventricle: Systolic function was normal. - Right atrium: The atrium was normal in size. - Tricuspid valve: There was mild regurgitation. - Pulmonary arteries: Systolic pressure was within the normal   range. - Inferior vena cava: The  vessel was normal in size. - Pericardium, extracardiac: There was no pericardial effusion.  Patient Profile     76 y.o. male admitted with CHB  Assessment & Plan    1. CHB - he remains in CHB. Plan for pacemaker today. Risks and benefits discussed. Risks include but not limited to bleeding, infection, tamponade, pneumothorax. He understands the risks and has agreed to pacemaker placement.  Continue dobutamine 2. Altered mentation - this has resolved.  3. Tobacco abuse - smoking cessation  Signed, Parmvir Boomer [name], MD  11/21/2016, 7:23 AM  Patient ID: James Curtis, male   DOB: 1941-06-13, 76 y.o.   MRN: [id number]

## 2016-11-21 NOTE — Progress Notes (Signed)
Pt transported to cath lab for pacemaker placement at this time.  Phone call placed to son, James Curtis - spoke to patient; given directions for waiting per cath lab staff.  VSS. Patient voided.  Transported w/monitor.

## 2016-11-22 ENCOUNTER — Inpatient Hospital Stay (HOSPITAL_COMMUNITY): Payer: Medicare Other

## 2016-11-22 NOTE — Progress Notes (Signed)
SUBJECTIVE: The patient is doing well today.  At this time, he denies chest pain, shortness of breath, or any new concerns.  Marland Kitchen aspirin EC  325 mg Oral Daily  . cholecalciferol  1,000 Units Oral Daily  . heparin  5,000 Units Subcutaneous Q8H  . hydrALAZINE  25 mg Oral Q8H  . irbesartan  300 mg Oral Daily  . simvastatin  20 mg Oral QHS     OBJECTIVE: Physical Exam: Vitals:   11/21/16 1143 11/21/16 2031 11/21/16 2045 11/22/16 0354  BP: (!) 163/75 (!) 191/77 (!) 169/87 122/78  Pulse: (!) 59 67 70 94  Resp:  20  20  Temp:  97.6 F (36.4 C)  98.5 F (36.9 C)  TempSrc:  Oral  Oral  SpO2:  96%  98%  Weight:      Height:        Intake/Output Summary (Last 24 hours) at 11/22/16 0846 Last data filed at 11/22/16 0354  Gross per 24 hour  Intake                0 ml  Output             1230 ml  Net            -1230 ml    Telemetry reveals AV pacing (personally reviewed)  GEN- The patient is elderly and obese appearing, alert and oriented x 3 today.   Head- normocephalic, atraumatic Eyes-  Sclera clear, conjunctiva pink Ears- hearing intact Oropharynx- clear Neck- supple  Lungs- Clear to ausculation bilaterally, normal work of breathing Heart- Regular rate and rhythm (paced) GI- soft, NT, ND, + BS Extremities- no clubbing, cyanosis, or edema Skin- no rash or lesion, left chest without hematoma/ecchymosis  Psych- euthymic mood, full affect Neuro- strength and sensation are intact   RADIOLOGY: Dg Chest 2 View Result Date: 11/22/2016 CLINICAL DATA:  Pacer placement EXAM: CHEST  2 VIEW COMPARISON:  None. FINDINGS: Placement of left pacer with leads in the right atrium and right ventricle. No pneumothorax. Heart is upper limits normal in size. Low lung volumes with bibasilar atelectasis. No effusions. IMPRESSION: Left pacer placement without pneumothorax. Bibasilar atelectasis. Electronically Signed   By: Rolm Baptise M.D.   On: 11/22/2016 07:19   Mr Virgel Paling XB Contrast  Result Date: 11/19/2016 CLINICAL DATA:  76 y/o M; episode of slurred speech and right facial droop with onset today. EXAM: MRI HEAD WITHOUT CONTRAST MRA HEAD WITHOUT CONTRAST TECHNIQUE: Multiplanar, multiecho pulse sequences of the brain and surrounding structures were obtained without intravenous contrast. Angiographic images of the head were obtained using MRA technique without contrast. COMPARISON:  08/11/2016 MRI of the brain. FINDINGS: MRI HEAD FINDINGS Moderate motion artifact degrades several sequences. Brain: No acute infarction, hemorrhage, hydrocephalus, extra-axial collection or mass lesion. Stable mild chronic microvascular ischemic changes and mild parenchymal volume loss of the brain. Stable small chronic lacunar infarct within left hemi pons. Vascular: As below. Skull and upper cervical spine: Normal marrow signal. Sinuses/Orbits: Negative. Other: None. MRA HEAD FINDINGS Internal carotid arteries: Patent. Lumen irregularity of bilateral cavernous and paraclinoid segments without high-grade stenosis is likely related to atherosclerotic calcification. Anterior cerebral arteries:  Patent. Middle cerebral arteries: Patent. Anterior communicating artery: Patent. Posterior communicating arteries: Probable diminutive left. No right identified, likely hypoplastic or absent. Posterior cerebral arteries:  Patent. Basilar artery:  Patent. Vertebral arteries:  Patent. No high-grade stenosis or aneurysm identified. Mild motion artifact, suboptimal assessment for subtle stenosis or small 2-3 mm  aneurysm. IMPRESSION: 1. No acute intracranial abnormality identified. 2. Stable mild chronic microvascular ischemic changes and mild parenchymal volume loss of the brain. Stable small chronic lacunar infarct in left hemi pons. 3. Patent circle of Willis. No high-grade stenosis, large vessel occlusion, or aneurysm identified. Electronically Signed   By: Kristine Garbe M.D.   On: 11/19/2016 00:33   ASSESSMENT AND  PLAN:  Active Problems:   CHB (complete heart block) (HCC)   TIA (transient ischemic attack)   Cardiac pacemaker in situ   Mitral valve vegetation   Essential hypertension  1.  Complete heart block S/p PPM CXR without ptx Device interrogation this morning reviewed and normal Usual follow up  2.  CVA Continued work up per neurology Patient needs TEE to evaluate mobile echodensity on mitral valve Endo unable to do today, scheduled for tomorrow. Orders entered Continue ASA  3.  HTN Stable No change required today Permissive hypertension with recent stroke Chanteria Haggard follow for now  [name], NP 11/22/2016 8:49 AM  I have seen and examined this patient with [name].  Agree with above, note added to reflect my findings.  On exam, RRR, no murmurs, lungs clear. Dual-chamber pacemaker placed yesterday for complete heart block. Device is functioning appropriately and chest x-ray is without major abnormality. We'll have him brought around the floor today, as he is been in bed for the last few days. If he is able to ambulate Clema Skousen plan for discharge.    Sherrel Shafer M. Devante Capano MD 11/22/2016 10:34 AM

## 2016-11-22 NOTE — Progress Notes (Signed)
STROKE TEAM PROGRESS NOTE   SUBJECTIVE (INTERVAL HISTORY) Son the POA is at bedside. Pt got pacemaker yesterday. No event overnight. Pending TEE tomorrow.   OBJECTIVE Temp:  [97.6 F (36.4 C)-98.5 F (36.9 C)] 98.5 F (36.9 C) (04/10 0354) Pulse Rate:  [59-94] 94 (04/10 0354) Cardiac Rhythm: Bundle branch block;Ventricular paced (04/10 0700) Resp:  [20] 20 (04/10 0354) BP: (119-191)/(67-87) 122/78 (04/10 0354) SpO2:  [96 %-100 %] 98 % (04/10 0354)  CBC:   Recent Labs Lab 11/18/16 1156  WBC 7.5  NEUTROABS 5.7  HGB 11.8*  HCT 35.1*  MCV 87.5  PLT 427    Basic Metabolic Panel:   Recent Labs Lab 11/18/16 1156 11/19/16 0323  NA 141 143  K 4.0 3.9  CL 106 107  CO2 25 26  GLUCOSE 128* 107*  BUN 16 18  CREATININE 1.37* 1.19  CALCIUM 9.5 9.3  MG 2.0  --     Lipid Panel:     Component Value Date/Time   CHOL 106 11/19/2016 0323   TRIG 97 11/19/2016 0323   HDL 27 (L) 11/19/2016 0323   CHOLHDL 3.9 11/19/2016 0323   VLDL 19 11/19/2016 0323   LDLCALC 60 11/19/2016 0323   HgbA1c:  Lab Results  Component Value Date   HGBA1C 6.2 (H) 11/19/2016   Urine Drug Screen: No results found for: LABOPIA, COCAINSCRNUR, LABBENZ, AMPHETMU, THCU, LABBARB  Alcohol Level No results found for: [place] I have personally reviewed the radiological images below and agree with the radiology interpretations.  Mr Brain 58 Contrast [name] Head Wo Contrast 11/19/2016 1. No acute intracranial abnormality identified. 2. Stable mild chronic microvascular ischemic changes and mild parenchymal volume loss of the brain. Stable small chronic lacunar infarct in left hemi pons. 3. Patent circle of Willis. No high-grade stenosis, large vessel occlusion, or aneurysm identified.   2D Echocardiogram  Impressions:   Hyperdynamic LVEF.  Moderate aortic stenosis.   Moderate mitral stenosis. The mitral valve is severely thickened and calcified with restricted leaflet opening. There are severe mitral  annular calcifications predominantly posterior. There is a mobile echodensity attached to the posterior mitral valve leaflet/annulus from the left atrial side measuring 12 x 7 mm. This might represent a vegetation vs detached calcifications. A TEE is recommended for further evaluation.  Carotid Doppler   There is 1-39% bilateral ICA stenosis. Vertebral artery flow is antegrade.     PHYSICAL EXAM  Temp:  [97.6 F (36.4 C)-98.5 F (36.9 C)] 98.5 F (36.9 C) (04/10 0354) Pulse Rate:  [59-94] 94 (04/10 0354) Resp:  [20] 20 (04/10 0354) BP: (119-191)/(67-87) 122/78 (04/10 0354) SpO2:  [96 %-100 %] 98 % (04/10 0354)  General - Well nourished, well developed, in no apparent distress.  Ophthalmologic - Fundi not visualized due to noncooperation.  Cardiovascular - Regular rate and rhythm.  Mental Status -  Level of arousal and orientation to time, place, and person were intact. Language including expression, naming, repetition, comprehension was assessed and found intact.  Cranial Nerves II - XII - II - Visual field intact OU. III, IV, VI - Extraocular movements intact. V - Facial sensation intact bilaterally. VII - Facial movement intact bilaterally. VIII - Hearing & vestibular intact bilaterally. X - Palate elevates symmetrically. XI - Chin turning & shoulder shrug intact bilaterally. XII - Tongue protrusion intact.  Motor Strength - The patient's strength was normal in all extremities and pronator drift was absent.  Bulk was normal and fasciculations were absent.   Motor  Tone - Muscle tone was assessed at the neck and appendages and was normal.  Reflexes - The patient's reflexes were 1+ in all extremities and he had no pathological reflexes.  Sensory - Light touch, temperature/pinprick were assessed and were symmetrical.    Coordination - The patient had normal movements in the hands with no ataxia or dysmetria.  Tremor was absent.  Gait and Station -  deferred.   ASSESSMENT/PLAN Mr. Michele Kerlin is a 76 y.o. male with history of HTN, HLD, tobacco smoker and diet-controlled DB presenting with slurred speech, confusion and R facial droop. He did not receive IV t-PA.   L brain TIA, etiology unclear, could be due to mitral valve mobile structure vs. Complete heart block  MRI  No acute stroke  MRA  Unremarkable   CUS unremarkable  2D Echo EF 65-70%. Moderate AS. Moderate MS. Mobile echodensity posterior mitral valve leaflet/annulus.   TEE tomorrow further evaluation of MV mobile echodensity.  LDL 60  HgbA1c 6.2  Heparin 5000 units sq tid for VTE prophylaxis Diet Heart Room service appropriate? Yes; Fluid consistency: Thin  aspirin 81 mg daily prior to admission, now on aspirin 325 mg daily.  Patient counseled to be compliant with his antithrombotic medications  Ongoing aggressive stroke risk factor management  Therapy recommendations:  pending   Disposition:  pending   Complete heart block  Confirmed on EKG  Cardiology on board  s/p pacemaker today  Hypertension  Stable Permissive hypertension (OK if < 220/120) but gradually normalize in 5-7 days Long-term BP goal normotensive  Hyperlipidemia  Home meds:  zocor 20, resumed in hospital  LDL 60, goal < 70  Continue statin at discharge  Diabetes  HgbA1c 6.2, goal < 7.0  Controlled  CBG stable  Tobacco abuse  Current smoker  Smoking cessation counseling provided  Pt is willing to quit  Other Stroke Risk Factors  Advanced age  Obesity, Body mass index is 34.1 kg/m., recommend weight loss, diet and exercise as appropriate   Other Active Problems  Encephalopathy  Hospital day # 4  Rosalin Hawking, MD PhD Stroke Neurology 11/22/2016 10:08 AM    To contact Stroke Continuity provider, please refer to [email or web address] After hours, contact General Neurology

## 2016-11-22 NOTE — Consult Note (Signed)
[facility] CM Primary Care Navigator  11/22/2016  Cleburn Maiolo [birth year] [phone]   Met with patient and son [name]) at the bedside to identify possible discharge needs. Son reports that patient was having slurred speech and confusion that had led to this admission.  Patient endorses Dr. [name] with [facility] at [facility] as the primary care provider.    Patient shared using [facility] at [facility] to obtain medications without any problem.   Son reports that patient is managing his own medications at home using "pill box" system.   He is able to drive prior to admission. Son states he can provide transportation to patient's doctors' appointments after discharge if schedule permits. List of transportation resources provided per son's request - to use in case he will not be available to transport patient.  Patient's son reports that his father had been living alone and independent with self care prior to admission. Son (living close by) will be the primary caregiver at home as stated.   Anticipated discharge plan is home per son.  Son mentioned that patient has a scheduled follow-up with PCP today but unable to go due to hospitalization. Son is aware to notify PCP's office and to reschedule appointment after discharge, for a post discharge follow-up visit within a week or sooner if needs arise.  Patient letter (with PCP's contact number) was provided as a reminder.  Patient and son expressed understanding to discuss with primary care provider on his follow-up appointment regarding plan for management of pre DM status.  Son and patient denied any further needs or concerns at this time.   For additional questions please contact:  Edwena Felty A. Ilaria Much, BSN, RN-BC [facility] PRIMARY CARE Navigator Cell: [phone]

## 2016-11-22 NOTE — Care Management Important Message (Signed)
Important Message  Patient Details  Name: James Curtis MRN: [id number] Date of Birth: 1940-12-22   Medicare Important Message Given:  Yes    Nathen May 11/22/2016, 11:49 AM

## 2016-11-23 ENCOUNTER — Encounter (HOSPITAL_COMMUNITY): Payer: Self-pay | Admitting: *Deleted

## 2016-11-23 ENCOUNTER — Encounter (HOSPITAL_COMMUNITY)
Admission: EM | Disposition: A | Discharge status: Home / Self Care | Payer: Self-pay | Source: Home / Self Care | Attending: Internal Medicine

## 2016-11-23 ENCOUNTER — Inpatient Hospital Stay (HOSPITAL_COMMUNITY): Payer: Medicare Other

## 2016-11-23 DIAGNOSIS — I340 Nonrheumatic mitral (valve) insufficiency: Secondary | ICD-10-CM

## 2016-11-23 HISTORY — PX: TEE WITHOUT CARDIOVERSION: SHX5443

## 2016-11-23 LAB — SEDIMENTATION RATE: SED RATE: 20 mm/h — AB (ref 0–16)

## 2016-11-23 LAB — C-REACTIVE PROTEIN

## 2016-11-23 SURGERY — ECHOCARDIOGRAM, TRANSESOPHAGEAL
Anesthesia: Moderate Sedation

## 2016-11-23 MED ORDER — SODIUM CHLORIDE 0.9 % IV SOLN: INTRAVENOUS | Status: DC

## 2016-11-23 MED ORDER — BUTAMBEN-TETRACAINE-BENZOCAINE 2-2-14 % EX AERO
INHALATION_SPRAY | CUTANEOUS | Status: DC | PRN
  Administered 2016-11-23: 2 via TOPICAL

## 2016-11-23 MED ORDER — MIDAZOLAM HCL 10 MG/2ML IJ SOLN
INTRAMUSCULAR | Status: DC | PRN
  Administered 2016-11-23 (×2): 2 mg via INTRAVENOUS

## 2016-11-23 MED ORDER — FENTANYL CITRATE (PF) 100 MCG/2ML IJ SOLN
INTRAMUSCULAR | Status: DC | PRN
  Administered 2016-11-23 (×2): 25 ug via INTRAVENOUS

## 2016-11-23 MED ORDER — FENTANYL CITRATE (PF) 100 MCG/2ML IJ SOLN
INTRAMUSCULAR | Status: AC
  Filled 2016-11-23: qty 2

## 2016-11-23 MED ORDER — MIDAZOLAM HCL 5 MG/ML IJ SOLN
INTRAMUSCULAR | Status: AC
  Filled 2016-11-23: qty 2

## 2016-11-23 NOTE — Plan of Care (Signed)
Problem: Activity: Goal: Risk for activity intolerance will decrease Outcome: Progressing Patient has PT order.

## 2016-11-23 NOTE — Progress Notes (Signed)
TEE results noted and discussed with Dr Marlou Porch and Dr Johnsie Cancel  James Curtis order blood cultures, ESR, CRP, TCTS consult for possible removal and pathology James Curtis discuss with son when patient back in room  James Marshall, NP 11/23/2016 12:52 PM  Haniah Penny Curt Bears, MD 11/23/2016 1:18 PM

## 2016-11-23 NOTE — Evaluation (Signed)
Physical Therapy Evaluation Patient Details Name: James Curtis MRN: [id number] DOB: [birth year] Today's Date: 11/23/2016   History of Present Illness   James Curtis is a 76 y.o. male with a history of HTN, HLD, current tobacco smoker and diet controlled DM presents for evaluation of confusion and slurred speech. EKG showed complete heart block and cardiology is asked for further evaluation  Clinical Impression  Pt is at or close to baseline functioning and should be safe at home with available assist. There are no further acute PT needs.  Will sign off at this time.     Follow Up Recommendations No PT follow up    Equipment Recommendations  None recommended by PT    Recommendations for Other Services       Precautions / Restrictions Precautions Precautions: ICD/Pacemaker      Mobility  Bed Mobility               General bed mobility comments: sitting EOB on arrival  Transfers Overall transfer level: Needs assistance   Transfers: Sit to/from Stand Sit to Stand: Modified independent (Device/Increase time)            Ambulation/Gait Ambulation/Gait assistance: Supervision Ambulation Distance (Feet): 300 Feet Assistive device: None Gait Pattern/deviations: Step-through pattern Gait velocity: moderate Gait velocity interpretation: at or above normal speed for age/gender General Gait Details: steady with no deviation even with moderate challenge  Stairs            Wheelchair Mobility    Modified Rankin (Stroke Patients Only)       Balance Overall balance assessment: No apparent balance deficits (not formally assessed)                                           Pertinent Vitals/Pain Pain Assessment: No/denies pain    Home Living Family/patient expects to be discharged to:: Private residence Living Arrangements: Alone Available Help at Discharge: Family;Available PRN/intermittently Type of Home: House Home Access: Level  entry     Home Layout: One level Home Equipment: None;Grab bars - tub/shower      Prior Function Level of Independence: Independent         Comments: drives, runs errands; son close by to assist as needed     Hand Dominance        Extremity/Trunk Assessment        Lower Extremity Assessment Lower Extremity Assessment: Overall WFL for tasks assessed       Communication   Communication: No difficulties  Cognition Arousal/Alertness: Awake/alert Behavior During Therapy: WFL for tasks assessed/performed Overall Cognitive Status: Within Functional Limits for tasks assessed                                 General Comments: Relayed to basic pacer precautions. to pt and son.      General Comments      Exercises     Assessment/Plan    PT Assessment Patient needs continued PT services  PT Problem List         PT Treatment Interventions      PT Goals (Current goals can be found in the Care Plan section)  Acute Rehab PT Goals Patient Stated Goal: home soon PT Goal Formulation: With patient    Frequency     Barriers to discharge  Co-evaluation               End of Session   Activity Tolerance: Patient tolerated treatment well Patient left: in bed;with bed alarm set;with family/visitor present;Other (comment) (sitting EOB) Nurse Communication: Mobility status PT Visit Diagnosis: Unsteadiness on feet (R26.81)    Time: 3536-1443 PT Time Calculation (min) (ACUTE ONLY): 25 min   Charges:   PT Evaluation $PT Eval Moderate Complexity: 1 Procedure PT Treatments $Gait Training: 8-22 mins   PT G Codes:        12/12/2016  Donnella Sham, PT [phone]  (pager)  Tessie Fass [name] 2016-12-12, 4:45 PM

## 2016-11-23 NOTE — Interval H&P Note (Signed)
History and Physical Interval Note:  11/23/2016 12:20 PM  James Curtis  has presented today for surgery, with the diagnosis of stroke  The various methods of treatment have been discussed with the patient and family. After consideration of risks, benefits and other options for treatment, the patient has consented to  Procedure(s): TRANSESOPHAGEAL ECHOCARDIOGRAM (TEE) (N/A) as a surgical intervention .  The patient's history has been reviewed, patient examined, no change in status, stable for surgery.  I have reviewed the patient's chart and labs.  Questions were answered to the patient's satisfaction.     [employer]

## 2016-11-23 NOTE — Progress Notes (Signed)
SUBJECTIVE: The patient is doing well today.  At this time, he denies chest pain, shortness of breath, or any new concerns.  Marland Kitchen aspirin EC  325 mg Oral Daily  . cholecalciferol  1,000 Units Oral Daily  . heparin  5,000 Units Subcutaneous Q8H  . hydrALAZINE  25 mg Oral Q8H  . irbesartan  300 mg Oral Daily  . simvastatin  20 mg Oral QHS   . sodium chloride      OBJECTIVE: Physical Exam: Vitals:   11/22/16 0354 11/22/16 1315 11/22/16 2026 11/23/16 0523  BP: 122/78 (!) 146/74 140/74 (!) 184/87  Pulse: 94  65 60  Resp: 20  20   Temp: 98.5 F (36.9 C)  98.1 F (36.7 C) 98.1 F (36.7 C)  TempSrc: Oral  Oral Oral  SpO2: 98%  99% 96%  Weight:      Height:        Intake/Output Summary (Last 24 hours) at 11/23/16 0810 Last data filed at 11/22/16 1310  Gross per 24 hour  Intake                0 ml  Output              875 ml  Net             -875 ml    Telemetry reveals AV pacing (personally reviewed)  GEN- The patient is elderly and obese appearing, alert and oriented x 3 today.   Head- normocephalic, atraumatic Eyes-  Sclera clear, conjunctiva pink Ears- hearing intact Oropharynx- clear Neck- supple  Lungs- Clear to ausculation bilaterally, normal work of breathing Heart- Regular rate and rhythm (paced) GI- soft, NT, ND, + BS Extremities- no clubbing, cyanosis, or edema Skin- no rash or lesion, left chest without hematoma/ecchymosis  Psych- euthymic mood, full affect Neuro- strength and sensation are intact   RADIOLOGY: Dg Chest 2 View Result Date: 11/22/2016 CLINICAL DATA:  Pacer placement EXAM: CHEST  2 VIEW COMPARISON:  None. FINDINGS: Placement of left pacer with leads in the right atrium and right ventricle. No pneumothorax. Heart is upper limits normal in size. Low lung volumes with bibasilar atelectasis. No effusions. IMPRESSION: Left pacer placement without pneumothorax. Bibasilar atelectasis. Electronically Signed   By: Rolm Baptise M.D.   On: 11/22/2016  07:19   Mr Virgel Paling ZW Contrast Result Date: 11/19/2016 CLINICAL DATA:  76 y/o M; episode of slurred speech and right facial droop with onset today. EXAM: MRI HEAD WITHOUT CONTRAST MRA HEAD WITHOUT CONTRAST TECHNIQUE: Multiplanar, multiecho pulse sequences of the brain and surrounding structures were obtained without intravenous contrast. Angiographic images of the head were obtained using MRA technique without contrast. COMPARISON:  08/11/2016 MRI of the brain. FINDINGS: MRI HEAD FINDINGS Moderate motion artifact degrades several sequences. Brain: No acute infarction, hemorrhage, hydrocephalus, extra-axial collection or mass lesion. Stable mild chronic microvascular ischemic changes and mild parenchymal volume loss of the brain. Stable small chronic lacunar infarct within left hemi pons. Vascular: As below. Skull and upper cervical spine: Normal marrow signal. Sinuses/Orbits: Negative. Other: None. MRA HEAD FINDINGS Internal carotid arteries: Patent. Lumen irregularity of bilateral cavernous and paraclinoid segments without high-grade stenosis is likely related to atherosclerotic calcification. Anterior cerebral arteries:  Patent. Middle cerebral arteries: Patent. Anterior communicating artery: Patent. Posterior communicating arteries: Probable diminutive left. No right identified, likely hypoplastic or absent. Posterior cerebral arteries:  Patent. Basilar artery:  Patent. Vertebral arteries:  Patent. No high-grade stenosis or aneurysm identified. Mild motion artifact,  suboptimal assessment for subtle stenosis or small 2-3 mm aneurysm. IMPRESSION: 1. No acute intracranial abnormality identified. 2. Stable mild chronic microvascular ischemic changes and mild parenchymal volume loss of the brain. Stable small chronic lacunar infarct in left hemi pons. 3. Patent circle of Willis. No high-grade stenosis, large vessel occlusion, or aneurysm identified. Electronically Signed   By: Kristine Garbe M.D.   On:  11/19/2016 00:33   ASSESSMENT AND PLAN:  Active Problems:   Complete heart block (HCC)   TIA (transient ischemic attack)   Cardiac pacemaker in situ   Mitral valve vegetation   Essential hypertension  1.  Complete heart block S/p PPM CXR without ptx Device interrogation reviewed and normal Usual follow up  2.  CVA Continued work up per neurology Patient needs TEE to evaluate mobile echodensity on mitral valve Endo unable to do today, scheduled for tomorrow. Orders entered If TEE negative, Amarilys Lyles plan for discharge today. Continue ASA  3.  HTN Stable No change required today Permissive hypertension with recent stroke Rockie Vawter follow for now  Aiza Vollrath M. Douglas Rooks MD 11/23/2016 8:10 AM

## 2016-11-23 NOTE — H&P (View-Only) (Signed)
SUBJECTIVE: The patient is doing well today.  At this time, he denies chest pain, shortness of breath, or any new concerns.  Marland Kitchen aspirin EC  325 mg Oral Daily  . cholecalciferol  1,000 Units Oral Daily  . heparin  5,000 Units Subcutaneous Q8H  . hydrALAZINE  25 mg Oral Q8H  . irbesartan  300 mg Oral Daily  . simvastatin  20 mg Oral QHS   . sodium chloride      OBJECTIVE: Physical Exam: Vitals:   11/22/16 0354 11/22/16 1315 11/22/16 2026 11/23/16 0523  BP: 122/78 (!) 146/74 140/74 (!) 184/87  Pulse: 94  65 60  Resp: 20  20   Temp: 98.5 F (36.9 C)  98.1 F (36.7 C) 98.1 F (36.7 C)  TempSrc: Oral  Oral Oral  SpO2: 98%  99% 96%  Weight:      Height:        Intake/Output Summary (Last 24 hours) at 11/23/16 0810 Last data filed at 11/22/16 1310  Gross per 24 hour  Intake                0 ml  Output              875 ml  Net             -875 ml    Telemetry reveals AV pacing (personally reviewed)  GEN- The patient is elderly and obese appearing, alert and oriented x 3 today.   Head- normocephalic, atraumatic Eyes-  Sclera clear, conjunctiva pink Ears- hearing intact Oropharynx- clear Neck- supple  Lungs- Clear to ausculation bilaterally, normal work of breathing Heart- Regular rate and rhythm (paced) GI- soft, NT, ND, + BS Extremities- no clubbing, cyanosis, or edema Skin- no rash or lesion, left chest without hematoma/ecchymosis  Psych- euthymic mood, full affect Neuro- strength and sensation are intact   RADIOLOGY: Dg Chest 2 View Result Date: 11/22/2016 CLINICAL DATA:  Pacer placement EXAM: CHEST  2 VIEW COMPARISON:  None. FINDINGS: Placement of left pacer with leads in the right atrium and right ventricle. No pneumothorax. Heart is upper limits normal in size. Low lung volumes with bibasilar atelectasis. No effusions. IMPRESSION: Left pacer placement without pneumothorax. Bibasilar atelectasis. Electronically Signed   By: Rolm Baptise M.D.   On: 11/22/2016  07:19   Mr Virgel Paling NF Contrast Result Date: 11/19/2016 CLINICAL DATA:  76 y/o M; episode of slurred speech and right facial droop with onset today. EXAM: MRI HEAD WITHOUT CONTRAST MRA HEAD WITHOUT CONTRAST TECHNIQUE: Multiplanar, multiecho pulse sequences of the brain and surrounding structures were obtained without intravenous contrast. Angiographic images of the head were obtained using MRA technique without contrast. COMPARISON:  08/11/2016 MRI of the brain. FINDINGS: MRI HEAD FINDINGS Moderate motion artifact degrades several sequences. Brain: No acute infarction, hemorrhage, hydrocephalus, extra-axial collection or mass lesion. Stable mild chronic microvascular ischemic changes and mild parenchymal volume loss of the brain. Stable small chronic lacunar infarct within left hemi pons. Vascular: As below. Skull and upper cervical spine: Normal marrow signal. Sinuses/Orbits: Negative. Other: None. MRA HEAD FINDINGS Internal carotid arteries: Patent. Lumen irregularity of bilateral cavernous and paraclinoid segments without high-grade stenosis is likely related to atherosclerotic calcification. Anterior cerebral arteries:  Patent. Middle cerebral arteries: Patent. Anterior communicating artery: Patent. Posterior communicating arteries: Probable diminutive left. No right identified, likely hypoplastic or absent. Posterior cerebral arteries:  Patent. Basilar artery:  Patent. Vertebral arteries:  Patent. No high-grade stenosis or aneurysm identified. Mild motion artifact,  suboptimal assessment for subtle stenosis or small 2-3 mm aneurysm. IMPRESSION: 1. No acute intracranial abnormality identified. 2. Stable mild chronic microvascular ischemic changes and mild parenchymal volume loss of the brain. Stable small chronic lacunar infarct in left hemi pons. 3. Patent circle of Willis. No high-grade stenosis, large vessel occlusion, or aneurysm identified. Electronically Signed   By: Kristine Garbe M.D.   On:  11/19/2016 00:33   ASSESSMENT AND PLAN:  Active Problems:   Complete heart block (HCC)   TIA (transient ischemic attack)   Cardiac pacemaker in situ   Mitral valve vegetation   Essential hypertension  1.  Complete heart block S/p PPM CXR without ptx Device interrogation reviewed and normal Usual follow up  2.  CVA Continued work up per neurology Patient needs TEE to evaluate mobile echodensity on mitral valve Endo unable to do today, scheduled for tomorrow. Orders entered If TEE negative, Zelphia Glover plan for discharge today. Continue ASA  3.  HTN Stable No change required today Permissive hypertension with recent stroke Amarie Viles follow for now  Kazuo Durnil M. Guila Owensby MD 11/23/2016 8:10 AM

## 2016-11-23 NOTE — Progress Notes (Signed)
  Echocardiogram 2D Echocardiogram has been performed.  James Curtis 11/23/2016, 1:10 PM

## 2016-11-23 NOTE — Interval H&P Note (Signed)
History and Physical Interval Note:  11/23/2016 12:27 PM  James Curtis  has presented today for surgery, with the diagnosis of stroke  The various methods of treatment have been discussed with the patient and family. After consideration of risks, benefits and other options for treatment, the patient has consented to  Procedure(s): TRANSESOPHAGEAL ECHOCARDIOGRAM (TEE) (N/A) as a surgical intervention .  The patient's history has been reviewed, patient examined, no change in status, stable for surgery.  I have reviewed the patient's chart and labs.  Questions were answered to the patient's satisfaction.     [employer]

## 2016-11-23 NOTE — Progress Notes (Signed)
STROKE TEAM PROGRESS NOTE   SUBJECTIVE (INTERVAL HISTORY) Son the POA is at bedside. Had TEE today showed mitral valve mobile calcified structure, etiology not clear at this time. [facility] has consulted for potential intervention.    OBJECTIVE Temp:  [97.7 F (36.5 C)-98.2 F (36.8 C)] 97.7 F (36.5 C) (04/11 1358) Pulse Rate:  [58-78] 67 (04/11 1358) Cardiac Rhythm: A-V Sequential paced (04/11 0700) Resp:  [15-30] 20 (04/11 1358) BP: (140-195)/(74-101) 176/91 (04/11 1358) SpO2:  [90 %-100 %] 100 % (04/11 1358)  CBC:   Recent Labs Lab 11/18/16 1156  WBC 7.5  NEUTROABS 5.7  HGB 11.8*  HCT 35.1*  MCV 87.5  PLT 749    Basic Metabolic Panel:   Recent Labs Lab 11/18/16 1156 11/19/16 0323  NA 141 143  K 4.0 3.9  CL 106 107  CO2 25 26  GLUCOSE 128* 107*  BUN 16 18  CREATININE 1.37* 1.19  CALCIUM 9.5 9.3  MG 2.0  --     Lipid Panel:     Component Value Date/Time   CHOL 106 11/19/2016 0323   TRIG 97 11/19/2016 0323   HDL 27 (L) 11/19/2016 0323   CHOLHDL 3.9 11/19/2016 0323   VLDL 19 11/19/2016 0323   LDLCALC 60 11/19/2016 0323   HgbA1c:  Lab Results  Component Value Date   HGBA1C 6.2 (H) 11/19/2016   Urine Drug Screen: No results found for: LABOPIA, COCAINSCRNUR, LABBENZ, AMPHETMU, THCU, LABBARB  Alcohol Level No results found for: [place] I have personally reviewed the radiological images below and agree with the radiology interpretations.  Mr Brain 25 Contrast Mr James Curtis Head Wo Contrast 11/19/2016 1. No acute intracranial abnormality identified. 2. Stable mild chronic microvascular ischemic changes and mild parenchymal volume loss of the brain. Stable small chronic lacunar infarct in left hemi pons. 3. Patent circle of Willis. No high-grade stenosis, large vessel occlusion, or aneurysm identified.   2D Echocardiogram  Impressions:   Hyperdynamic LVEF.  Moderate aortic stenosis.   Moderate mitral stenosis. The mitral valve is severely thickened and  calcified with restricted leaflet opening. There are severe mitral annular calcifications predominantly posterior. There is a mobile echodensity attached to the posterior mitral valve leaflet/annulus from the left atrial side measuring 12 x 7 mm. This might represent a vegetation vs detached calcifications. A TEE is recommended for further evaluation.  Carotid Doppler   There is 1-39% bilateral ICA stenosis. Vertebral artery flow is antegrade.    TEE Left ventricle: The cavity size was normal. There was severe   concentric hypertrophy. Systolic function was normal. The   estimated ejection fraction was in the range of 60% to 65%. - Aortic valve: Cusp separation was reduced. Noncoronary cusp   mobility was restricted. - Mitral valve: Mildly calcified annulus. Mildly thickened leaflets   . There was a 16 x 4 mm mobile calcified echodensity on the   atrial basal surface of the posterior mitral valve. Differential   includes fibroelastoma or vegetation (checking blood cultures).   Chronic vegetations are usually calcified as this mass currently   demonstrates, however they are not as mobile as we are seeing.   There was mild regurgitation. - Left atrium: No evidence of thrombus in the atrial cavity or   appendage. No evidence of thrombus in the appendage. - Right atrium: Pacer wire or catheter noted in right atrium. No   evidence of thrombus in the atrial cavity or appendage. - Tricuspid valve: No evidence of vegetation. - Pulmonic valve:  No evidence of vegetation.   PHYSICAL EXAM  Temp:  [97.7 F (36.5 C)-98.2 F (36.8 C)] 97.7 F (36.5 C) (04/11 1358) Pulse Rate:  [58-78] 67 (04/11 1358) Resp:  [15-30] 20 (04/11 1358) BP: (140-195)/(74-101) 176/91 (04/11 1358) SpO2:  [90 %-100 %] 100 % (04/11 1358)  General - Well nourished, well developed, in no apparent distress.  Ophthalmologic - Fundi not visualized due to noncooperation.  Cardiovascular - Regular rate and  rhythm.  Mental Status -  Level of arousal and orientation to time, place, and person were intact. Language including expression, naming, repetition, comprehension was assessed and found intact.  Cranial Nerves II - XII - II - Visual field intact OU. III, IV, VI - Extraocular movements intact. V - Facial sensation intact bilaterally. VII - Facial movement intact bilaterally. VIII - Hearing & vestibular intact bilaterally. X - Palate elevates symmetrically. XI - Chin turning & shoulder shrug intact bilaterally. XII - Tongue protrusion intact.  Motor Strength - The patient's strength was normal in all extremities and pronator drift was absent.  Bulk was normal and fasciculations were absent.   Motor Tone - Muscle tone was assessed at the neck and appendages and was normal.  Reflexes - The patient's reflexes were 1+ in all extremities and he had no pathological reflexes.  Sensory - Light touch, temperature/pinprick were assessed and were symmetrical.    Coordination - The patient had normal movements in the hands with no ataxia or dysmetria.  Tremor was absent.  Gait and Station - deferred.   ASSESSMENT/PLAN [name] is a 76 y.o. male with history of HTN, HLD, tobacco smoker and diet-controlled DB presenting with slurred speech, confusion and R facial droop. He did not receive IV t-PA.   L brain TIA, etiology unclear, could be due to mitral valve mobile structure vs. Complete heart block  MRI  No acute stroke  MRA  Unremarkable   CUS unremarkable  2D Echo EF 65-70%. Moderate AS. Moderate MS. Mobile echodensity posterior mitral valve leaflet/annulus.   TEE again showed MV mobile calcified structure with unclear etiology.  LDL 60  HgbA1c 6.2  Heparin 5000 units sq tid for VTE prophylaxis Diet Heart Room service appropriate? Yes; Fluid consistency: Thin  aspirin 81 mg daily prior to admission, now on aspirin 325 mg daily.   Patient counseled to be compliant  with his antithrombotic medications  Ongoing aggressive stroke risk factor management  Therapy recommendations:  pending   Disposition:  pending   MV mobile density  TTE and TEE confirmed MV mobile calcified structure  Etiology not clear  [facility] has consulted for potential removal and pathology.   BCx pending  ESR and CRP pending  May need to consider anticoagulation if decided made with no intervention and endocarditis is excluded.  Complete heart block  Confirmed on EKG  Cardiology on board  s/p pacemaker this admission  Hypertension  Stable Permissive hypertension (OK if < 220/120) but gradually normalize in 5-7 days Long-term BP goal normotensive  Hyperlipidemia  Home meds:  zocor 20, resumed in hospital  LDL 60, goal < 70  Continue statin at discharge  Diabetes  HgbA1c 6.2, goal < 7.0  Controlled  CBG stable  Tobacco abuse  Current smoker  Smoking cessation counseling provided  Pt is willing to quit  Other Stroke Risk Factors  Advanced age  Obesity, Body mass index is 34.1 kg/m., recommend weight loss, diet and exercise as appropriate   Other Active Problems  Encephalopathy  Hospital  day # 5  Rosalin Hawking, MD PhD Stroke Neurology 11/23/2016 5:24 PM    To contact Stroke Continuity provider, please refer to [email or web address] After hours, contact General Neurology

## 2016-11-24 ENCOUNTER — Encounter (HOSPITAL_COMMUNITY): Payer: Self-pay | Admitting: Thoracic Surgery (Cardiothoracic Vascular Surgery)

## 2016-11-24 DIAGNOSIS — I350 Nonrheumatic aortic (valve) stenosis: Secondary | ICD-10-CM

## 2016-11-24 DIAGNOSIS — I519 Heart disease, unspecified: Secondary | ICD-10-CM

## 2016-11-24 DIAGNOSIS — F0390 Unspecified dementia without behavioral disturbance: Secondary | ICD-10-CM | POA: Diagnosis present

## 2016-11-24 NOTE — Progress Notes (Signed)
STROKE TEAM PROGRESS NOTE   SUBJECTIVE (INTERVAL HISTORY) Son the POA is at bedside. Neuro stable. As per son, CVTS consulted and offered open heart surgery. Patient and son are thinking about it.    OBJECTIVE Temp:  [97.6 F (36.4 C)-98 F (36.7 C)] 97.6 F (36.4 C) (04/12 1313) Pulse Rate:  [60-65] 65 (04/12 1313) Cardiac Rhythm: A-V Sequential paced (04/12 0810) Resp:  [18] 18 (04/12 1313) BP: (145-160)/(73-89) 145/89 (04/12 1313) SpO2:  [97 %-100 %] 99 % (04/12 1313)  CBC:   Recent Labs Lab 11/18/16 1156  WBC 7.5  NEUTROABS 5.7  HGB 11.8*  HCT 35.1*  MCV 87.5  PLT 119    Basic Metabolic Panel:   Recent Labs Lab 11/18/16 1156 11/19/16 0323  NA 141 143  K 4.0 3.9  CL 106 107  CO2 25 26  GLUCOSE 128* 107*  BUN 16 18  CREATININE 1.37* 1.19  CALCIUM 9.5 9.3  MG 2.0  --     Lipid Panel:     Component Value Date/Time   CHOL 106 11/19/2016 0323   TRIG 97 11/19/2016 0323   HDL 27 (L) 11/19/2016 0323   CHOLHDL 3.9 11/19/2016 0323   VLDL 19 11/19/2016 0323   LDLCALC 60 11/19/2016 0323   HgbA1c:  Lab Results  Component Value Date   HGBA1C 6.2 (H) 11/19/2016   Urine Drug Screen: No results found for: LABOPIA, COCAINSCRNUR, LABBENZ, AMPHETMU, THCU, LABBARB  Alcohol Level No results found for: [place] I have personally reviewed the radiological images below and agree with the radiology interpretations.  Mr Brain 104 Contrast Mr James Curtis Head Wo Contrast 11/19/2016 1. No acute intracranial abnormality identified. 2. Stable mild chronic microvascular ischemic changes and mild parenchymal volume loss of the brain. Stable small chronic lacunar infarct in left hemi pons. 3. Patent circle of Willis. No high-grade stenosis, large vessel occlusion, or aneurysm identified.   2D Echocardiogram  Impressions:   Hyperdynamic LVEF.  Moderate aortic stenosis.   Moderate mitral stenosis. The mitral valve is severely thickened and calcified with restricted leaflet opening.  There are severe mitral annular calcifications predominantly posterior. There is a mobile echodensity attached to the posterior mitral valve leaflet/annulus from the left atrial side measuring 12 x 7 mm. This might represent a vegetation vs detached calcifications. A TEE is recommended for further evaluation.  Carotid Doppler   There is 1-39% bilateral ICA stenosis. Vertebral artery flow is antegrade.    TEE Left ventricle: The cavity size was normal. There was severe   concentric hypertrophy. Systolic function was normal. The   estimated ejection fraction was in the range of 60% to 65%. - Aortic valve: Cusp separation was reduced. Noncoronary cusp   mobility was restricted. - Mitral valve: Mildly calcified annulus. Mildly thickened leaflets   . There was a 16 x 4 mm mobile calcified echodensity on the   atrial basal surface of the posterior mitral valve. Differential   includes fibroelastoma or vegetation (checking blood cultures).   Chronic vegetations are usually calcified as this mass currently   demonstrates, however they are not as mobile as we are seeing.   There was mild regurgitation. - Left atrium: No evidence of thrombus in the atrial cavity or   appendage. No evidence of thrombus in the appendage. - Right atrium: Pacer wire or catheter noted in right atrium. No   evidence of thrombus in the atrial cavity or appendage. - Tricuspid valve: No evidence of vegetation. - Pulmonic valve: No evidence  of vegetation.   PHYSICAL EXAM  Temp:  [97.6 F (36.4 C)-98 F (36.7 C)] 97.6 F (36.4 C) (04/12 1313) Pulse Rate:  [60-65] 65 (04/12 1313) Resp:  [18] 18 (04/12 1313) BP: (145-160)/(73-89) 145/89 (04/12 1313) SpO2:  [97 %-100 %] 99 % (04/12 1313)  General - Well nourished, well developed, in no apparent distress.  Ophthalmologic - Fundi not visualized due to noncooperation.  Cardiovascular - Regular rate and rhythm.  Mental Status -  Level of arousal and orientation to  time, place, and person were intact. Language including expression, naming, repetition, comprehension was assessed and found intact.  Cranial Nerves II - XII - II - Visual field intact OU. III, IV, VI - Extraocular movements intact. V - Facial sensation intact bilaterally. VII - Facial movement intact bilaterally. VIII - Hearing & vestibular intact bilaterally. X - Palate elevates symmetrically. XI - Chin turning & shoulder shrug intact bilaterally. XII - Tongue protrusion intact.  Motor Strength - The patient's strength was normal in all extremities and pronator drift was absent.  Bulk was normal and fasciculations were absent.   Motor Tone - Muscle tone was assessed at the neck and appendages and was normal.  Reflexes - The patient's reflexes were 1+ in all extremities and he had no pathological reflexes.  Sensory - Light touch, temperature/pinprick were assessed and were symmetrical.    Coordination - The patient had normal movements in the hands with no ataxia or dysmetria.  Tremor was absent.  Gait and Station - deferred.   ASSESSMENT/PLAN [name] is a 77 y.o. male with history of HTN, HLD, tobacco smoker and diet-controlled DB presenting with slurred speech, confusion and R facial droop. He did not receive IV t-PA.   L brain TIA, etiology unclear, could be due to mitral valve mobile structure vs. Complete heart block  MRI  No acute stroke  MRA  Unremarkable   CUS unremarkable  2D Echo EF 65-70%. Moderate AS. Moderate MS. Mobile echodensity posterior mitral valve leaflet/annulus.   TEE again showed MV mobile calcified structure with unclear etiology.  LDL 60  HgbA1c 6.2  Heparin 5000 units sq tid for VTE prophylaxis Diet Heart Room service appropriate? Yes; Fluid consistency: Thin  aspirin 81 mg daily prior to admission, now on aspirin 325 mg daily. Continue aspirin for now.  Patient counseled to be compliant with his antithrombotic  medications  Ongoing aggressive stroke risk factor management  Therapy recommendations:  pending   Disposition:  pending   MV mobile density  TTE and TEE confirmed MV mobile calcified structure  Etiology not clear  CTVS has offered open-heart surgery. Family and patient are thinking.   BCx NGTD  ESR 20 and CRP <0.8   May need to consider anticoagulation if decisioin made for no intervention and endocarditis is excluded.  Complete heart block  Confirmed on EKG  Cardiology on board  s/p pacemaker this admission  Hypertension  Stable Permissive hypertension (OK if < 220/120) but gradually normalize in 5-7 days Long-term BP goal normotensive  Hyperlipidemia  Home meds:  zocor 20, resumed in hospital  LDL 60, goal < 70  Continue statin at discharge  Diabetes  HgbA1c 6.2, goal < 7.0  Controlled  CBG stable  Tobacco abuse  Current smoker  Smoking cessation counseling provided  Pt is willing to quit  Other Stroke Risk Factors  Advanced age  Obesity, Body mass index is 34.1 kg/m., recommend weight loss, diet and exercise as appropriate   Other Active  Problems  Encephalopathy  Hospital day # 6   Neurology will sign off. Please call with questions. Pt will follow up with Dr. Erlinda Hong at [facility] in about 6 weeks. Thanks for the consult.  Rosalin Hawking, MD PhD Stroke Neurology 11/24/2016 7:13 PM    To contact Stroke Continuity provider, please refer to [email or web address] After hours, contact General Neurology

## 2016-11-24 NOTE — Progress Notes (Signed)
SUBJECTIVE: The patient is doing well today.  At this time, he denies chest pain, shortness of breath, or any new concerns. TEE showed a 16x85mm mass on the mitral valve. Have consulted surgery for recommendations.  Marland Kitchen aspirin EC  325 mg Oral Daily  . cholecalciferol  1,000 Units Oral Daily  . heparin  5,000 Units Subcutaneous Q8H  . hydrALAZINE  25 mg Oral Q8H  . irbesartan  300 mg Oral Daily  . simvastatin  20 mg Oral QHS     OBJECTIVE: Physical Exam: Vitals:   11/23/16 1325 11/23/16 1358 11/23/16 1955 11/24/16 0615  BP: (!) 179/93 (!) 176/91 (!) 160/79 (!) 154/73  Pulse: 62 67 60 63  Resp: (!) 23 20 18 18   Temp:  97.7 F (36.5 C) 98 F (36.7 C) 97.7 F (36.5 C)  TempSrc:  Oral Oral Oral  SpO2: 96% 100% 100% 97%  Weight:      Height:        Intake/Output Summary (Last 24 hours) at 11/24/16 1039 Last data filed at 11/24/16 6387  Gross per 24 hour  Intake              720 ml  Output              550 ml  Net              170 ml    Telemetry reveals AV pacing (personally reviewed)  GEN- The patient is elderly and obese appearing, alert and oriented x 3 today.   Head- normocephalic, atraumatic Eyes-  Sclera clear, conjunctiva pink Ears- hearing intact Oropharynx- clear Neck- supple  Lungs- Clear to ausculation bilaterally, normal work of breathing Heart- Regular rate and rhythm (paced) GI- soft, NT, ND, + BS Extremities- no clubbing, cyanosis, or edema Skin- no rash or lesion, left chest without hematoma/ecchymosis  Psych- euthymic mood, full affect Neuro- strength and sensation are intact   RADIOLOGY: Dg Chest 2 View Result Date: 11/22/2016 CLINICAL DATA:  Pacer placement EXAM: CHEST  2 VIEW COMPARISON:  None. FINDINGS: Placement of left pacer with leads in the right atrium and right ventricle. No pneumothorax. Heart is upper limits normal in size. Low lung volumes with bibasilar atelectasis. No effusions. IMPRESSION: Left pacer placement without pneumothorax.  Bibasilar atelectasis. Electronically Signed   By: Rolm Baptise M.D.   On: 11/22/2016 07:19   Mr Virgel Paling FI Contrast Result Date: 11/19/2016 CLINICAL DATA:  76 y/o M; episode of slurred speech and right facial droop with onset today. EXAM: MRI HEAD WITHOUT CONTRAST MRA HEAD WITHOUT CONTRAST TECHNIQUE: Multiplanar, multiecho pulse sequences of the brain and surrounding structures were obtained without intravenous contrast. Angiographic images of the head were obtained using MRA technique without contrast. COMPARISON:  08/11/2016 MRI of the brain. FINDINGS: MRI HEAD FINDINGS Moderate motion artifact degrades several sequences. Brain: No acute infarction, hemorrhage, hydrocephalus, extra-axial collection or mass lesion. Stable mild chronic microvascular ischemic changes and mild parenchymal volume loss of the brain. Stable small chronic lacunar infarct within left hemi pons. Vascular: As below. Skull and upper cervical spine: Normal marrow signal. Sinuses/Orbits: Negative. Other: None. MRA HEAD FINDINGS Internal carotid arteries: Patent. Lumen irregularity of bilateral cavernous and paraclinoid segments without high-grade stenosis is likely related to atherosclerotic calcification. Anterior cerebral arteries:  Patent. Middle cerebral arteries: Patent. Anterior communicating artery: Patent. Posterior communicating arteries: Probable diminutive left. No right identified, likely hypoplastic or absent. Posterior cerebral arteries:  Patent. Basilar artery:  Patent. Vertebral arteries:  Patent. No high-grade stenosis or aneurysm identified. Mild motion artifact, suboptimal assessment for subtle stenosis or small 2-3 mm aneurysm. IMPRESSION: 1. No acute intracranial abnormality identified. 2. Stable mild chronic microvascular ischemic changes and mild parenchymal volume loss of the brain. Stable small chronic lacunar infarct in left hemi pons. 3. Patent circle of Willis. No high-grade stenosis, large vessel occlusion, or  aneurysm identified. Electronically Signed   By: Kristine Garbe M.D.   On: 11/19/2016 00:33   ASSESSMENT AND PLAN:  Active Problems:   Complete heart block (HCC)   TIA (transient ischemic attack)   Cardiac pacemaker in situ   Mitral valve vegetation   Essential hypertension  1.  Complete heart block S/p PPM CXR without ptx Device interrogation reviewed and normal Usual follow up  2.  CVA Continued work up per neurology Continue ASA Mass seen on mitral valve  3.  HTN Stable No change required today Permissive hypertension with recent stroke Neyra Pettie follow for now  4. 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  Deadrick Stidd M. Taiven Greenley MD 11/24/2016 10:39 AM

## 2016-11-24 NOTE — Evaluation (Signed)
Occupational Therapy Evaluation and Discharge  Patient Details Name: James Curtis MRN: [id number] DOB: Nov 26, 1940 Today's Date: 11/24/2016    History of Present Illness  James Curtis is a 76 y.o. male with a history of HTN, HLD, current tobacco smoker and diet controlled DM presents for evaluation of confusion and slurred speech. EKG showed complete heart block and cardiology is asked for further evaluation   Clinical Impression   Pt reports he was independent with ADL PTA. Currently pt overall supervision for ADL and functional mobility; quickly approaching mod I. Pt needs cues to maintain pacer precautions during functional activities; reviewed precautions with pt and son. Pt planning to d/c home alone with intermittent supervision from family. No further acute OT needs identified; signing off at this time. Please re-consult if needs change. Thank you for this referral.    Follow Up Recommendations  No OT follow up;Supervision - Intermittent    Equipment Recommendations  None recommended by OT    Recommendations for Other Services       Precautions / Restrictions Precautions Precautions: ICD/Pacemaker Restrictions Weight Bearing Restrictions: No      Mobility Bed Mobility               General bed mobility comments: Pt OOB in chair upon arrival.  Transfers Overall transfer level: Needs assistance Equipment used: None Transfers: Sit to/from Stand Sit to Stand: Modified independent (Device/Increase time)              Balance Overall balance assessment: No apparent balance deficits (not formally assessed)                                         ADL either performed or assessed with clinical judgement   ADL Overall ADL's : Needs assistance/impaired Eating/Feeding: Independent;Sitting   Grooming: Supervision/safety;Standing   Upper Body Bathing: Set up;Sitting   Lower Body Bathing: Supervison/ safety   Upper Body Dressing : Set  up;Sitting Upper Body Dressing Details (indicate cue type and reason): Educated on compensatory strategies to maintain pacer precautions Lower Body Dressing: Supervision/safety   Toilet Transfer: Supervision/safety;Ambulation;Regular Toilet       Tub/ Banker: Supervision/safety;Tub transfer;Grab bars   Functional mobility during ADLs: Supervision/safety General ADL Comments: Pt reports feeling at his functional baseline. Reviewed pacer precautions with pt and son.     Vision         Perception     Praxis      Pertinent Vitals/Pain Pain Assessment: No/denies pain     Hand Dominance Right   Extremity/Trunk Assessment Upper Extremity Assessment Upper Extremity Assessment: LUE deficits/detail LUE: Unable to fully assess due to immobilization (due to pacer placement)   Lower Extremity Assessment Lower Extremity Assessment: Defer to PT evaluation   Cervical / Trunk Assessment Cervical / Trunk Assessment: Normal   Communication Communication Communication: No difficulties   Cognition Arousal/Alertness: Awake/alert Behavior During Therapy: WFL for tasks assessed/performed Overall Cognitive Status: Within Functional Limits for tasks assessed                                 General Comments: Pt with decreased recall of pacer precautions. Son reminding pt during functional activities.   General Comments       Exercises     Shoulder Instructions      Home Living Family/patient expects to be  discharged to:: Private residence Living Arrangements: Alone Available Help at Discharge: Family;Available PRN/intermittently Type of Home: House Home Access: Level entry     Home Layout: One level     Bathroom Shower/Tub: Tub/shower unit;Curtain   Biochemist, clinical: Standard     Home Equipment: None;Grab bars - tub/shower          Prior Functioning/Environment Level of Independence: Independent        Comments: drives, runs errands; son  close by to assist as needed        OT Problem List:        OT Treatment/Interventions:      OT Goals(Current goals can be found in the care plan section) Acute Rehab OT Goals Patient Stated Goal: home soon OT Goal Formulation: All assessment and education complete, DC therapy  OT Frequency:     Barriers to D/C:            Co-evaluation              End of Session    Activity Tolerance: Patient tolerated treatment well Patient left: in chair;with family/visitor present  OT Visit Diagnosis: Muscle weakness (generalized) (M62.81)                Time: 7741-4239 OT Time Calculation (min): 18 min Charges:  OT General Charges $OT Visit: 1 Procedure OT Evaluation $OT Eval Moderate Complexity: 1 Procedure G-Codes:     James Curtis A. Ulice Brilliant, M.S., OTR/L Pager: [place] 11/24/2016, 10:40 AM

## 2016-11-24 NOTE — Consult Note (Addendum)
[address]       [place] [address]             [phone]          CARDIOTHORACIC SURGERY CONSULTATION REPORT  PCP is Kandice Hams, MD Referring Provider is CAMNITZ, WILL MARTIN, MD   Reason for consultation:  Left atrial mass s/p TIA  HPI:  Patient is a 76 year old African-American male with known history of heart murmur, long-standing hypertension, hyperlipidemia, tobacco abuse, diet-controlled type 2 diabetes mellitus, and mild dementia who has been referred for surgical consultation to discuss treatment options for management of recently discovered small, mobile-appearing mass in the left atrium within the context of possible recent TIA.  The patient has long-standing history of hypertension and a heart murmur for which he was evaluated many years ago in [place]. After his wife passed away several years ago the patient moved to [place] to live close to his son. According to the patient's son he has developed relatively mild problems with short-term memory loss and poor insight over the past year or so.  He has been fully evaluated by a neurologist (Dr. Leta Baptist) on 2 previous occasions, and diagnosed with mild dementia. Despite this, he has remained functionally independent and reasonably active.  He was last seen by his neurologist in February, and more recently he was seen by his primary care provider on 11/16/2016 for routine annual follow-up. At that time he was noted to be hypotensive and bradycardic. Coreg was discontinued. On the morning of 11/18/2016 the patient's son telephoned him and noted that the patient sounded confused, somewhat agitated, and had slurred speech. His son came to check on and promptly and noted what he thought might be right sided facial droop and slurred speech. These symptoms all seemed to resolve within 10-15 minutes. He brought the patient directly to the emergency department for evaluation.  EKG at the time revealed  complete heart block with heart rate of 40. He was not hypotensive. He was admitted to the hospital and evaluated by neurology and cardiology. MRI of the brain revealed no acute intracranial abnormality and stable mild chronic microvascular ischemic changes with some mild parenchymal volume loss and stable small chronic lacunar infarct in the left hemi-pons.  The patient's transient neurologic symptoms were attributed to a likely TIA.  Transthoracic echocardiogram was performed demonstrating vigorous left ventricular systolic function with at least mild concentric left ventricular hypertrophy. There was moderate aortic stenosis with peak velocity across the aortic valve measuring close to 4 m/s corresponding to mean transvalvular gradient estimated 23 mmHg.  The DVI was reported 0.42.  There was severe calcification in the mitral annulus with thickened moderately calcified leaflets. There was felt to be moderate mitral stenosis and mild mitral regurgitation.  In addition there was a mobile echodensity attached to the posterior mitral annulus within the left atrium measuring greater than 1 cm in its greatest dimension, suspicious for possible vegetation.  TEE was recommended. The patient underwent implantation of a dual-chamber pacemaker on 11/21/2016 and TEE on 11/23/2016.  TEE confirmed the presence of some degree of aortic stenosis with moderate thickening with restricted leaflet mobility and significant calcification and restriction of the noncoronary cusp of the aortic valve. There was mild calcification of the mitral annulus with only mildly thickened leaflets and no significant mitral stenosis. There was mild mitral regurgitation. 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 Blood cultures were obtained and cardiothoracic surgical consultation was requested.  The patient  is widowed and lives alone in [place]. His son lives close by, remains very supportive, is present for his consultation this evening, and is the patient's medical power of attorney. The patient still drives an automobile and remains functionally independent. He has developed some problems with short-term memory loss and intermittent confusion that has been mild but somewhat progressive over the past year or more. The patient's son notes that the patient's confusion seems to be considerably worse since he has been in the hospital. However, the patient is oriented 3 and answers most questions appropriately. The patient denies any symptoms of exertional shortness of breath or chest discomfort. He has had some occasional palpitations recently without dizzy spells or syncope. The patient and his son both deny any history of focal neurologic symptoms other than that which occurred at the time of his admission to the hospital on 11/18/2016. Appetite is good. He has not been having any fevers or chills. He denies any significant arthritis or arthralgias.  Past Medical History:  Diagnosis Date  . Aortic stenosis   . Complete heart block ([place]) 11/18/2016  . Dementia    mild  . Diabetes ([place])    diet controlled  . Heart murmur   . Hypercholesteremia   . Hypertension    benign  . Left atrial mass   . Prostate cancer [facility])    s/p radiation  . TIA (transient ischemic attack)     Past Surgical History:  Procedure Laterality Date  . PACEMAKER IMPLANT N/A 11/21/2016   Procedure: Pacemaker Implant;  Surgeon: Will [name], MD;  Location: [facility];  Service: Cardiovascular;  Laterality: N/A;    History reviewed. No pertinent family history.  Social History   Social History  . Marital status: Widowed    Spouse name: N/A  . Number of children: 2  . Years of education: 12   Occupational History  .      retired   Social History Main Topics  . Smoking status: Former Smoker     Packs/day: 0.25    Types: Cigarettes    Quit date: 11/16/2016  . Smokeless tobacco: Never Used  . Alcohol use No  . Drug use: No  . Sexual activity: Not on file   Other Topics Concern  . Not on file   Social History Narrative   Lives alone   Caffeine     Prior to Admission medications   Medication Sig Start Date End Date Taking? Authorizing Provider  amLODipine (NORVASC) 10 MG tablet Take 10 mg by mouth daily. 10/25/16  Yes Historical Provider, MD  Apoaequorin (PREVAGEN PO) Take 1 tablet by mouth daily. For Brain and [place]   Yes Historical Provider, MD  aspirin EC 81 MG tablet Take 81 mg by mouth daily.   Yes Historical Provider, MD  cholecalciferol (VITAMIN D) 1000 units tablet Take 1,000 Units by mouth daily.   Yes Historical Provider, MD  ibuprofen (ADVIL,MOTRIN) 200 MG tablet Take 200 mg by mouth every 6 (six) hours as needed for headache (pain).   Yes Historical Provider, MD  simvastatin (ZOCOR) 20 MG tablet Take 20 mg by mouth at bedtime.    Yes Historical Provider, MD  Tetrahydrozoline HCl (VISINE OP) Place 1 drop into both eyes daily as needed (dry eyes).   Yes Historical Provider, MD  valsartan (DIOVAN) 320  MG tablet Take 320 mg by mouth daily.   Yes Historical Provider, MD    Current Facility-Administered Medications  Medication Dose Route Frequency Provider Last Rate Last Dose  . acetaminophen (TYLENOL) tablet 325-650 mg  325-650 mg Oral Q4H PRN Will [name], MD      . aspirin EC tablet 325 mg  325 mg Oral Daily Rosalin Hawking, MD   325 mg at 11/24/16 1006  . cholecalciferol (VITAMIN D) tablet 1,000 Units  1,000 Units Oral Daily Bhavinkumar Bhagat, PA   1,000 Units at 11/24/16 1006  . heparin injection 5,000 Units  5,000 Units Subcutaneous Q8H Bhavinkumar Bhagat, PA   5,000 Units at 11/24/16 1449  . hydrALAZINE (APRESOLINE) tablet 25 mg  25 mg Oral Q8H Bhavinkumar Bhagat, PA   25 mg at 11/24/16 1449  . irbesartan (AVAPRO) tablet 300 mg  300 mg Oral Daily  Bhavinkumar Bhagat, PA   300 mg at 11/24/16 1006  . nitroGLYCERIN (NITROSTAT) SL tablet 0.4 mg  0.4 mg Sublingual Q5 Min x 3 PRN Bhavinkumar Bhagat, PA      . ondansetron (ZOFRAN) injection 4 mg  4 mg Intravenous Q6H PRN Will [name], MD      . polyvinyl alcohol (LIQUIFILM TEARS) 1.4 % ophthalmic solution 1 drop  1 drop Both Eyes Daily PRN Deboraha Sprang, MD      . simvastatin (ZOCOR) tablet 20 mg  20 mg Oral QHS Bhavinkumar Bhagat, PA   20 mg at 11/23/16 2147    No Known Allergies    Review of Systems:   General:  normal appetite, normal energy, no weight gain, no weight loss, no fever  Cardiac:  no chest pain with exertion, no chest pain at rest, no SOB with exertion, no resting SOB, no PND, no orthopnea, + palpitations, + arrhythmia, no atrial fibrillation, no LE edema, no dizzy spells, no syncope  Respiratory:  no shortness of breath, no home oxygen, no productive cough, occasional dry cough, no bronchitis, no wheezing, no hemoptysis, no asthma, no pain with inspiration or cough, no sleep apnea, no CPAP at night  GI:   no difficulty swallowing, no reflux, no frequent heartburn, no hiatal hernia, no abdominal pain, no constipation, no diarrhea, no hematochezia, no hematemesis, no melena  GU:   no dysuria,  no frequency, no urinary tract infection, no hematuria, no enlarged prostate, no kidney stones, no kidney disease  Vascular:  no pain suggestive of claudication, no pain in feet, no leg cramps, no varicose veins, no DVT, no non-healing foot ulcer  Neuro:   no stroke, + TIA's, no seizures, no headaches, no temporary blindness one eye,  + slurred speech, no peripheral neuropathy, no chronic pain, no instability of gait, + memory/cognitive dysfunction  Musculoskeletal: mild arthritis - primarily involving the hands, no joint swelling, no myalgias, no difficulty walking, normal mobility   Skin:   no rash, no itching, no skin infections, no pressure sores or ulcerations  Psych:   no  anxiety, no depression, no nervousness, no unusual recent stress  Eyes:   + blurry vision, no floaters, + recent vision changes, does not wear glasses or contacts  ENT:   no hearing loss, no loose or painful teeth, no dentures, last saw dentist approximately 1 year ago  Hematologic:  no easy bruising, no abnormal bleeding, no clotting disorder, no frequent epistaxis  Endocrine:  + diabetes, does not check CBG's at home     Physical Exam:   BP (!) 145/89 (BP Location:  Left Arm)   Pulse 65   Temp 97.6 F (36.4 C) (Oral)   Resp 18   Ht 5\' 7"  (1.702 m)   Wt 217 lb 11.2 oz (98.7 kg)   SpO2 99%   BMI 34.10 kg/m   General:  Moderately obese,  well-appearing  HEENT:  Unremarkable   Neck:   no JVD, no bruits, no adenopathy   Chest:   clear to auscultation, symmetrical breath sounds, no wheezes, no rhonchi   CV:   RRR, grade II/VI systolic murmur   Abdomen:  soft, non-tender, no masses   Extremities:  warm, well-perfused, pulses diminished but palpable, no lower extremity edema  Rectal/GU  Deferred  Neuro:   Grossly non-focal and symmetrical throughout  Skin:   Clean and dry, no rashes, no breakdown  Diagnostic Tests:  MRI HEAD WITHOUT CONTRAST  MRA HEAD WITHOUT CONTRAST  TECHNIQUE: Multiplanar, multiecho pulse sequences of the brain and surrounding structures were obtained without intravenous contrast. Angiographic images of the head were obtained using MRA technique without contrast.  COMPARISON:  08/11/2016 MRI of the brain.  FINDINGS: MRI HEAD FINDINGS  Moderate motion artifact degrades several sequences.  Brain: No acute infarction, hemorrhage, hydrocephalus, extra-axial collection or mass lesion. Stable mild chronic microvascular ischemic changes and mild parenchymal volume loss of the brain. Stable small chronic lacunar infarct within left hemi pons.  Vascular: As below.  Skull and upper cervical spine: Normal marrow signal.  Sinuses/Orbits:  Negative.  Other: None.  MRA HEAD FINDINGS  Internal carotid arteries: Patent. Lumen irregularity of bilateral cavernous and paraclinoid segments without high-grade stenosis is likely related to atherosclerotic calcification.  Anterior cerebral arteries:  Patent.  Middle cerebral arteries: Patent.  Anterior communicating artery: Patent.  Posterior communicating arteries: Probable diminutive left. No right identified, likely hypoplastic or absent.  Posterior cerebral arteries:  Patent.  Basilar artery:  Patent.  Vertebral arteries:  Patent.  No high-grade stenosis or aneurysm identified. Mild motion artifact, suboptimal assessment for subtle stenosis or small 2-3 mm aneurysm.  IMPRESSION: 1. No acute intracranial abnormality identified. 2. Stable mild chronic microvascular ischemic changes and mild parenchymal volume loss of the brain. Stable small chronic lacunar infarct in left hemi pons. 3. Patent circle of Willis. No high-grade stenosis, large vessel occlusion, or aneurysm identified.   Electronically Signed   By: Kristine Garbe M.D.   On: 11/19/2016 00:33   Transthoracic Echocardiography  Patient:    Savir, Blanke MR #:       [id number] Study Date: 11/20/2016 Gender:     M Age:        31 Height:     170.2 cm Weight:     98.7 kg BSA:        2.2 m^2 Pt. Status: Room:       4NP05C   ORDERING     Cristopher Peru, MD  REFERRING    Cristopher Peru, MD  ADMITTING    Virl Axe  ATTENDING    Gareth Morgan  PERFORMING   [facility] Inpatient  REFERRING    Leanor Kail  SONOGRAPHER  Mikki Santee  cc:  ------------------------------------------------------------------- LV EF: 65% -   70%  ------------------------------------------------------------------- History:   PMH:  Complete heart block. Patient needs pacemaker tomorrow.  Murmur.  Risk factors:  Hypertension. Diabetes mellitus.    ------------------------------------------------------------------- Study Conclusions  - Left ventricle: The cavity size was normal. There was mild   concentric hypertrophy. Systolic function was vigorous. The   estimated ejection fraction was in the range  of 65% to 70%. Wall   motion was normal; there were no regional wall motion   abnormalities. Doppler parameters are consistent with abnormal   left ventricular relaxation (grade 1 diastolic dysfunction). - Aortic valve: Valve mobility was restricted. There was moderate   stenosis. Mean gradient (S): 26 mm Hg. Peak gradient (S): 62 mm   Hg. Valve area (VTI): 1.75 cm^2. Valve area (Vmax): 1.31 cm^2.   Valve area (Vmean): 1.54 cm^2. - Aortic root: The aortic root was normal in size. - Mitral valve: Severely calcified annulus. Severely thickened,   moderately calcified leaflets . Leaflet separation was mildly   reduced. The findings are consistent with moderate stenosis.   There was mild regurgitation. Valve area by pressure half-time:   1.16 cm^2. Valve area by continuity equation (using LVOT flow):   1.65 cm^2. - Left atrium: The atrium was mildly dilated. - Right ventricle: Systolic function was normal. - Right atrium: The atrium was normal in size. - Tricuspid valve: There was mild regurgitation. - Pulmonary arteries: Systolic pressure was within the normal   range. - Inferior vena cava: The vessel was normal in size. - Pericardium, extracardiac: There was no pericardial effusion.  Impressions:  - Hyperdynamic LVEF.   Moderate aortic stenosis.   Moderate mitral stenosis. The mitral valve is severely thickened   and calcified with restricted leaflet opening. There are severe   mitral annular calcifications predominantly posterior.   There is a mobile echodensity attached to the posterior mitral   valve leaflet/annulus from the left atrial side measuring 12 x 7   mm. This might represent a vegetation vs detached  calcifications.   A TEE is recommended for further evaluation.  ------------------------------------------------------------------- Study data:  No prior study was available for comparison.  Study status:  Routine.  Procedure:  The patient reported no pain pre or post test. Transthoracic echocardiography. Image quality was adequate.  Study completion:  There were no complications. Transthoracic echocardiography.  M-mode, complete 2D, spectral Doppler, and color Doppler.  Birthdate:  Patient birthdate: Jul 25, 1941.  Age:  Patient is 76 yr old.  Sex:  Gender: male. BMI: 34.1 kg/m^2.  Blood pressure:     148/115  Patient status: Inpatient.  Study date:  Study date: 11/20/2016. Study time: 03:35 PM.  Location:  [facility] laboratory.  -------------------------------------------------------------------  ------------------------------------------------------------------- Left ventricle:  The cavity size was normal. There was mild concentric hypertrophy. Systolic function was vigorous. The estimated ejection fraction was in the range of 65% to 70%. Wall motion was normal; there were no regional wall motion abnormalities. Doppler parameters are consistent with abnormal left ventricular relaxation (grade 1 diastolic dysfunction).  ------------------------------------------------------------------- Aortic valve:   Trileaflet; severely thickened, severely calcified leaflets. Valve mobility was restricted.  Doppler:   There was moderate stenosis.   There was no regurgitation.    VTI ratio of LVOT to aortic valve: 0.56. Valve area (VTI): 1.75 cm^2. Indexed valve area (VTI): 0.8 cm^2/m^2. Peak velocity ratio of LVOT to aortic valve: 0.42. Valve area (Vmax): 1.31 cm^2. Indexed valve area (Vmax): 0.6 cm^2/m^2. Mean velocity ratio of LVOT to aortic valve: 0.49. Valve area (Vmean): 1.54 cm^2. Indexed valve area (Vmean): 0.7 cm^2/m^2.    Mean gradient (S): 26 mm Hg. Peak gradient (S): 62 mm  Hg.  ------------------------------------------------------------------- Aorta:  Aortic root: The aortic root was normal in size.  ------------------------------------------------------------------- Mitral valve:   Severely calcified annulus. Severely thickened, moderately calcified leaflets . Leaflet separation was mildly reduced. Mobility was not restricted.  Doppler:  The findings are consistent with moderate stenosis.   There was mild regurgitation.   Valve area by pressure half-time: 1.16 cm^2. Indexed valve area by pressure half-time: 0.53 cm^2/m^2. Valve area by continuity equation (using LVOT flow): 1.65 cm^2. Indexed valve area by continuity equation (using LVOT flow): 0.75 cm^2/m^2.    Mean gradient (D): 5 mm Hg. Peak gradient (D): 5 mm Hg.  ------------------------------------------------------------------- Left atrium:  The atrium was mildly dilated.  ------------------------------------------------------------------- Right ventricle:  The cavity size was normal. Wall thickness was normal. Systolic function was normal.  ------------------------------------------------------------------- Pulmonic valve:    Structurally normal valve.   Cusp separation was normal.  Doppler:  Transvalvular velocity was within the normal range. There was no evidence for stenosis. There was no regurgitation.  ------------------------------------------------------------------- Tricuspid valve:   Structurally normal valve.    Doppler: Transvalvular velocity was within the normal range. There was mild regurgitation.  ------------------------------------------------------------------- Pulmonary artery:   The main pulmonary artery was normal-sized. Systolic pressure was within the normal range.  ------------------------------------------------------------------- Right atrium:  The atrium was normal in  size.  ------------------------------------------------------------------- Pericardium:  There was no pericardial effusion.  ------------------------------------------------------------------- Systemic veins: Inferior vena cava: The vessel was normal in size.  ------------------------------------------------------------------- Measurements   Left ventricle                           Value          Reference  LV ID, ED, PLAX chordal                  45    mm       43 - 52  LV ID, ES, PLAX chordal                  33    mm       23 - 38  LV fx shortening, PLAX chordal   (L)     27    %        >=29  LV PW thickness, ED                      13    mm       ----------  IVS/LV PW ratio, ED                      0.92           <=1.3  Stroke volume, 2D                        136   ml       ----------  Stroke volume/bsa, 2D                    62    ml/m^2   ----------    Ventricular septum                       Value          Reference  IVS thickness, ED                        12    mm       ----------    LVOT  Value          Reference  LVOT ID, S                               20    mm       ----------  LVOT area                                3.14  cm^2     ----------  LVOT peak velocity, S                    165   cm/s     ----------  LVOT mean velocity, S                    105   cm/s     ----------  LVOT VTI, S                              43.4  cm       ----------  LVOT peak gradient, S                    11    mm Hg    ----------    Aortic valve                             Value          Reference  Aortic valve peak velocity, S            394   cm/s     ----------  Aortic valve mean velocity, S            214   cm/s     ----------  Aortic valve VTI, S                      77.9  cm       ----------  Aortic mean gradient, S                  23    mm Hg    ----------  Aortic peak gradient, S                  62    mm Hg    ----------  VTI ratio,  LVOT/AV                       0.56           ----------  Aortic valve area, VTI                   1.75  cm^2     ----------  Aortic valve area/bsa, VTI               0.8   cm^2/m^2 ----------  Velocity ratio, peak, LVOT/AV            0.42           ----------  Aortic valve area, peak velocity         1.31  cm^2     ----------  Aortic valve area/bsa, peak  0.6   cm^2/m^2 ----------  velocity  Velocity ratio, mean, LVOT/AV            0.49           ----------  Aortic valve area, mean velocity         1.54  cm^2     ----------  Aortic valve area/bsa, mean              0.7   cm^2/m^2 ----------  velocity    Aorta                                    Value          Reference  Aortic root ID, ED                       33    mm       ----------    Left atrium                              Value          Reference  LA ID, A-P, ES                           43    mm       ----------  LA ID/bsa, A-P                           1.96  cm/m^2   <=2.2  LA volume, S                             85    ml       ----------  LA volume/bsa, S                         38.7  ml/m^2   ----------  LA volume, ES, 1-p A4C                   53.5  ml       ----------  LA volume/bsa, ES, 1-p A4C               24.3  ml/m^2   ----------  LA volume, ES, 1-p A2C                   117   ml       ----------  LA volume/bsa, ES, 1-p A2C               53.2  ml/m^2   ----------    Mitral valve                             Value          Reference  Mitral E-wave peak velocity              112   cm/s     ----------  Mitral A-wave peak velocity              160   cm/s     ----------  Mitral mean velocity, D  97.4  cm/s     ----------  Mitral deceleration time         (H)     512   ms       150 - 230  Mitral pressure half-time                189   ms       ----------  Mitral mean gradient, D                  5     mm Hg    ----------  Mitral peak gradient, D                  5     mm Hg    ----------  Mitral  E/A ratio, peak                   0.7            ----------  Mitral valve area, PHT, DP               1.16  cm^2     ----------  Mitral valve area/bsa, PHT, DP           0.53  cm^2/m^2 ----------  Mitral valve area, LVOT                  1.65  cm^2     ----------  continuity  Mitral valve area/bsa, LVOT              0.75  cm^2/m^2 ----------  continuity  Mitral annulus VTI, D                    82.5  cm       ----------    Right atrium                             Value          Reference  RA ID, S-I, ES, A4C              (H)     68.7  mm       34 - 49  RA area, ES, A4C                 (H)     21.6  cm^2     8.3 - 19.5  RA volume, ES, A/L                       55.3  ml       ----------  RA volume/bsa, ES, A/L                   25.2  ml/m^2   ----------  Legend: (L)  and  (H)  mark values outside specified reference range.  ------------------------------------------------------------------- Prepared and Electronically Authenticated by  Ena Dawley, M.D. 2018-04-08T17:29:34   Transesophageal Echocardiography  Patient:    Zacheriah, Stumpe MR #:       [id number] Study Date: 11/23/2016 Gender:     M Age:        77 Height:     170.2 cm Weight:     98.6 kg BSA:        2.2 m^2 Pt. Status: Room:       2W34C   ATTENDING    Jolyn Nap, M.D.  SONOGRAPHER  Sharion Dove, RVS  PERFORMING   Candee Furbish, M.D.  ADMITTING    Ricky Stabs, Amber  REFERRING    [place], Amber  SONOGRAPHER  Mikki Santee  cc:  ------------------------------------------------------------------- LV EF: 60% -   65%  ------------------------------------------------------------------- Indications:      TIA 435.9.  ------------------------------------------------------------------- History:   PMH:   Murmur.  Risk factors:  Former tobacco use. Hypertension. Diabetes mellitus.  ------------------------------------------------------------------- Study  Conclusions  - Left ventricle: The cavity size was normal. There was severe   concentric hypertrophy. Systolic function was normal. The   estimated ejection fraction was in the range of 60% to 65%. - Aortic valve: Cusp separation was reduced. Noncoronary cusp   mobility was restricted. - Mitral valve: Mildly calcified annulus. Mildly thickened leaflets   . There was a 16 x 4 mm mobile calcified echodensity on the   atrial basal surface of the posterior mitral valve. Differential   includes fibroelastoma or vegetation (checking blood cultures).   Chronic vegetations are usually calcified as this mass currently   demonstrates, however they are not as mobile as we are seeing.   There was mild regurgitation. - Left atrium: No evidence of thrombus in the atrial cavity or   appendage. No evidence of thrombus in the appendage. - Right atrium: Pacer wire or catheter noted in right atrium. No   evidence of thrombus in the atrial cavity or appendage. - Tricuspid valve: No evidence of vegetation. - Pulmonic valve: No evidence of vegetation.  Recommendations:  This procedure has been discussed with the referring physician.  ------------------------------------------------------------------- Labs, prior tests, procedures, and surgery: Permanent pacemaker system implantation.  ------------------------------------------------------------------- Study data:   Study status:  Routine.  Consent:  The risks, benefits, and alternatives to the procedure were explained to the patient and informed consent was obtained.  Procedure:  The patient reported no pain pre or post test. Initial setup. The patient was brought to the laboratory. Surface ECG leads were monitored. Sedation. Conscious sedation was administered by cardiology staff. Transesophageal echocardiography. Topical anesthesia was obtained using viscous lidocaine. A transesophageal probe was inserted by the attending cardiologist. Image  quality was adequate.  Study completion:  The patient tolerated the procedure well. There were no complications.  Administered medications:   Fentanyl, 44mcg. Midazolam, 4mg .          Diagnostic transesophageal echocardiography.  2D and color Doppler.  Birthdate:  Patient birthdate: October 21, 1940.  Age:  Patient is 76 yr old.  Sex:  Gender: male.    BMI: 34.1 kg/m^2.  Blood pressure:     161/99  Patient status:  Inpatient.  Study date:  Study date: 11/23/2016. Study time: 12:09 PM.  Location:  Endoscopy.  -------------------------------------------------------------------  ------------------------------------------------------------------- Left ventricle:  The cavity size was normal. There was severe concentric hypertrophy. Systolic function was normal. The estimated ejection fraction was in the range of 60% to 65%.  ------------------------------------------------------------------- Aortic valve:   Severely thickened, severely calcified leaflets. Cusp separation was reduced.  Noncoronary cusp mobility was restricted.  Doppler:  Transvalvular velocity was minimally increased. There was no stenosis.  ------------------------------------------------------------------- Aorta:  The aorta was normal, not dilated, and non-diseased.  ------------------------------------------------------------------- Mitral valve:   Mildly calcified annulus. Mildly thickened leaflets . Leaflet separation was normal. There was a 16 x 4 mm mobile calcified echodensity on the atrial basal surface of the posterior mitral valve. Differential includes fibroelastoma or vegetation (checking blood cultures). Chronic vegetations are usually calcified as this mass currently  demonstrates, however they are not as mobile as we are seeing.  Doppler:  There was mild regurgitation.  ------------------------------------------------------------------- Left atrium:  The atrium was normal in size.  No evidence  of thrombus in the atrial cavity or appendage.  No evidence of thrombus in the appendage. The appendage was of normal size. Emptying velocity was normal.  ------------------------------------------------------------------- Right ventricle:  The cavity size was normal. Wall thickness was normal. Pacer wire or catheter noted in right ventricle. Systolic function was normal.  ------------------------------------------------------------------- Pulmonic valve:   Poorly visualized.  Structurally normal valve. Cusp separation was normal.  No evidence of vegetation.  ------------------------------------------------------------------- Tricuspid valve:   Structurally normal valve.   Leaflet separation was normal.  No evidence of vegetation.  Doppler:  There was no regurgitation.  ------------------------------------------------------------------- Right atrium:  The atrium was normal in size. There is nodular appearance to RA/RV free wall. Pacer wire or catheter noted in right atrium.  No evidence of thrombus in the atrial cavity or appendage.  ------------------------------------------------------------------- Pericardium:  The pericardium was normal in appearance. There was no pericardial effusion.  ------------------------------------------------------------------- Post procedure conclusions Ascending Aorta:  - The aorta was normal, not dilated, and non-diseased.  ------------------------------------------------------------------- Prepared and Electronically Authenticated by  Candee Furbish, M.D. 2018-04-11T13:47:55   Impression:  I have personally reviewed the patient's transthoracic and transesophageal echocardiograms. 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   The possibility that this is a vegetation seems relatively unlikely given the absence of any other clinical signs or symptoms to suggest the possibility of endocarditis.  Other less likely explanations might include thrombus or fractured plaque and fibrous debris from the posterior annulus which does have some degree of degenerative fibrosis and calcification. The presence of this mass is concerning given the likelihood that the patient sustained a recent TIA that may have been embolic in source.  Some of the patient's symptoms may have been related to hypotension that developed in the setting of complete heart block, but some of the description given by both the patient and his son remained concerning for possible unilateral neurologic symptoms. Under the circumstances I agree that it would be very appropriate to consider surgical intervention for resection of the patient's left atrial mass to eliminate any further risk of embolization. The patient does have significant aortic stenosis as well. The aortic valve is trileaflet and there is some fibrosis and restricted leaflet mobility involving all 3 leaflets with particularly significant calcification and restriction of the noncoronary leaflet. Peak velocity across the aortic valve measured close to 4 m/s, consistent with moderate aortic stenosis. Under the circumstances, we would normally recommend aortic valve replacement in the setting of open cardiac surgery for other indications. On the other hand, the patient and his son acknowledge that the patient has experienced some degree of significant functional decline over the past year or more with cognitive problems consistent with mild dementia. There is no question that proceeding with open cardiac surgery in this setting would come with somewhat increased risk of stroke and/or exacerbation of the patient's ongoing cognitive dysfunction with the possibility of significant functional decline.  In this  context the risks of surgery need to be balanced against the risk of stroke if surgery is not performed.   Plan:  I discussed these matters at length with  the patient and his son at the bedside. We discussed the indications for surgical intervention including limitation the risk of embolic stroke related to the left atrial mass. We also discussed the presence of moderate aortic stenosis and the relative indications for concomitant aortic valve replacement because of concerns regarding the possibility of significant disease progression in the near future as his aortic valve is not replaced at this time. We discussed the risks of surgery and fully explored all concerns regarding the patient's pre-existing mild dementia. The patient seems disinclined to proceed with surgery and his son wants to think matters over and discuss further with the patient and other family members before making a final decision.  Under the circumstances I agree this seems very appropriate. If the patient and his family decided that they wish to proceed with surgery he will need to undergo diagnostic cardiac catheterization. On the other hand, if they are inclined to wish to hold off I would recommend antiplatelet therapy using aspirin. We'll continue to follow closely and assist with decision making as possible.   I spent in excess of 120 minutes during the conduct of this hospital consultation and >50% of this time involved direct face-to-face encounter for counseling and/or coordination of the patient's care.    Valentina Gu. Roxy Manns, MD 11/24/2016 6:47 PM

## 2016-11-25 ENCOUNTER — Other Ambulatory Visit: Payer: Self-pay | Admitting: *Deleted

## 2016-11-25 ENCOUNTER — Encounter (HOSPITAL_COMMUNITY): Payer: Self-pay | Admitting: Cardiology

## 2016-11-25 ENCOUNTER — Inpatient Hospital Stay (HOSPITAL_COMMUNITY): Payer: Medicare Other

## 2016-11-25 ENCOUNTER — Encounter (HOSPITAL_COMMUNITY)
Admission: EM | Disposition: A | Discharge status: Home / Self Care | Payer: Self-pay | Source: Home / Self Care | Attending: Internal Medicine

## 2016-11-25 DIAGNOSIS — Z01810 Encounter for preprocedural cardiovascular examination: Secondary | ICD-10-CM

## 2016-11-25 DIAGNOSIS — I2510 Atherosclerotic heart disease of native coronary artery without angina pectoris: Secondary | ICD-10-CM

## 2016-11-25 DIAGNOSIS — I519 Heart disease, unspecified: Secondary | ICD-10-CM

## 2016-11-25 DIAGNOSIS — I058 Other rheumatic mitral valve diseases: Secondary | ICD-10-CM

## 2016-11-25 HISTORY — PX: RIGHT/LEFT HEART CATH AND CORONARY ANGIOGRAPHY: CATH118266

## 2016-11-25 LAB — TYPE AND SCREEN
ABO/RH(D): B POS
Antibody Screen: NEGATIVE

## 2016-11-25 LAB — POCT I-STAT, CHEM 8
BUN: 17 mg/dL (ref 6–20)
CALCIUM ION: 1.29 mmol/L (ref 1.15–1.40)
Chloride: 106 mmol/L (ref 101–111)
Creatinine, Ser: 1.3 mg/dL — ABNORMAL HIGH (ref 0.61–1.24)
GLUCOSE: 101 mg/dL — AB (ref 65–99)
HCT: 33 % — ABNORMAL LOW (ref 39.0–52.0)
Hemoglobin: 11.2 g/dL — ABNORMAL LOW (ref 13.0–17.0)
Potassium: 3.9 mmol/L (ref 3.5–5.1)
Sodium: 143 mmol/L (ref 135–145)
TCO2: 25 mmol/L (ref 0–100)

## 2016-11-25 LAB — URINALYSIS, COMPLETE (UACMP) WITH MICROSCOPIC
Bacteria, UA: NONE SEEN
Bilirubin Urine: NEGATIVE
GLUCOSE, UA: NEGATIVE mg/dL
HGB URINE DIPSTICK: NEGATIVE
Ketones, ur: NEGATIVE mg/dL
Leukocytes, UA: NEGATIVE
Nitrite: NEGATIVE
PH: 6 (ref 5.0–8.0)
PROTEIN: 30 mg/dL — AB
Specific Gravity, Urine: 1.023 (ref 1.005–1.030)

## 2016-11-25 LAB — PULMONARY FUNCTION TEST
FEF 25-75 POST: 3.4 L/s
FEF 25-75 Pre: 3.61 L/sec
FEF2575-%Change-Post: -5 %
FEF2575-%PRED-POST: 180 %
FEF2575-%PRED-PRE: 191 %
FEV1-%CHANGE-POST: 1 %
FEV1-%PRED-POST: 82 %
FEV1-%Pred-Pre: 81 %
FEV1-POST: 2.17 L
FEV1-Pre: 2.15 L
FEV1FVC-%CHANGE-POST: 1 %
FEV1FVC-%Pred-Pre: 123 %
FEV6-%Change-Post: 0 %
FEV6-%PRED-PRE: 70 %
FEV6-%Pred-Post: 70 %
FEV6-PRE: 2.42 L
FEV6-Post: 2.42 L
FEV6FVC-%PRED-PRE: 107 %
FEV6FVC-%Pred-Post: 107 %
FVC-%CHANGE-POST: 0 %
FVC-%PRED-PRE: 65 %
FVC-%Pred-Post: 65 %
FVC-PRE: 2.42 L
FVC-Post: 2.42 L
POST FEV1/FVC RATIO: 90 %
POST FEV6/FVC RATIO: 100 %
PRE FEV6/FVC RATIO: 100 %
Pre FEV1/FVC ratio: 89 %

## 2016-11-25 LAB — POCT I-STAT 3, ART BLOOD GAS (G3+)
Acid-base deficit: 3 mmol/L — ABNORMAL HIGH (ref 0.0–2.0)
Bicarbonate: 22.1 mmol/L (ref 20.0–28.0)
O2 SAT: 91 %
PCO2 ART: 37.9 mmHg (ref 32.0–48.0)
PH ART: 7.373 (ref 7.350–7.450)
TCO2: 23 mmol/L (ref 0–100)
pO2, Arterial: 62 mmHg — ABNORMAL LOW (ref 83.0–108.0)

## 2016-11-25 LAB — POCT I-STAT 3, VENOUS BLOOD GAS (G3P V)
ACID-BASE DEFICIT: 2 mmol/L (ref 0.0–2.0)
Bicarbonate: 22.6 mmol/L (ref 20.0–28.0)
O2 SAT: 67 %
PCO2 VEN: 38.9 mmHg — AB (ref 44.0–60.0)
PO2 VEN: 36 mmHg (ref 32.0–45.0)
TCO2: 24 mmol/L (ref 0–100)
pH, Ven: 7.373 (ref 7.250–7.430)

## 2016-11-25 LAB — ABO/RH: ABO/RH(D): B POS

## 2016-11-25 LAB — POCT ACTIVATED CLOTTING TIME: Activated Clotting Time: 202 seconds

## 2016-11-25 SURGERY — RIGHT/LEFT HEART CATH AND CORONARY ANGIOGRAPHY
Anesthesia: LOCAL

## 2016-11-25 MED ORDER — HYDRALAZINE HCL 20 MG/ML IJ SOLN
INTRAMUSCULAR | Status: AC
  Filled 2016-11-25: qty 1

## 2016-11-25 MED ORDER — ALBUTEROL SULFATE (2.5 MG/3ML) 0.083% IN NEBU
2.5000 mg | INHALATION_SOLUTION | Freq: Once | RESPIRATORY_TRACT | Status: AC
  Administered 2016-11-25: 2.5 mg via RESPIRATORY_TRACT

## 2016-11-25 MED ORDER — LIDOCAINE HCL (PF) 1 % IJ SOLN
INTRAMUSCULAR | Status: AC
  Filled 2016-11-25: qty 30

## 2016-11-25 MED ORDER — SODIUM CHLORIDE 0.9 % WEIGHT BASED INFUSION
3.0000 mL/kg/h | INTRAVENOUS | Status: DC
  Administered 2016-11-25: 3 mL/kg/h via INTRAVENOUS

## 2016-11-25 MED ORDER — HEPARIN SODIUM (PORCINE) 1000 UNIT/ML IJ SOLN
INTRAMUSCULAR | Status: DC | PRN
  Administered 2016-11-25: 5000 [IU] via INTRAVENOUS

## 2016-11-25 MED ORDER — SODIUM CHLORIDE 0.9% FLUSH: 3.0000 mL | INTRAVENOUS | Status: DC | PRN

## 2016-11-25 MED ORDER — IOPAMIDOL (ISOVUE-370) INJECTION 76%
INTRAVENOUS | Status: DC | PRN
  Administered 2016-11-25: 70 mL via INTRA_ARTERIAL

## 2016-11-25 MED ORDER — SODIUM CHLORIDE 0.9 % WEIGHT BASED INFUSION: 3.0000 mL/kg/h | INTRAVENOUS | Status: DC

## 2016-11-25 MED ORDER — VERAPAMIL HCL 2.5 MG/ML IV SOLN
INTRAVENOUS | Status: AC
  Filled 2016-11-25: qty 2

## 2016-11-25 MED ORDER — SODIUM CHLORIDE 0.9 % IV SOLN: 250.0000 mL | INTRAVENOUS | Status: DC | PRN

## 2016-11-25 MED ORDER — LIDOCAINE HCL (PF) 1 % IJ SOLN
INTRAMUSCULAR | Status: DC | PRN
  Administered 2016-11-25 (×2): 2 mL

## 2016-11-25 MED ORDER — HEPARIN (PORCINE) IN NACL 2-0.9 UNIT/ML-% IJ SOLN
INTRAMUSCULAR | Status: DC | PRN
  Administered 2016-11-25: 1000 mL

## 2016-11-25 MED ORDER — HEPARIN SODIUM (PORCINE) 1000 UNIT/ML IJ SOLN
INTRAMUSCULAR | Status: AC
  Filled 2016-11-25: qty 1

## 2016-11-25 MED ORDER — VERAPAMIL HCL 2.5 MG/ML IV SOLN
INTRAVENOUS | Status: DC | PRN
  Administered 2016-11-25: 16:00:00 via INTRA_ARTERIAL

## 2016-11-25 MED ORDER — HEPARIN (PORCINE) IN NACL 2-0.9 UNIT/ML-% IJ SOLN
INTRAMUSCULAR | Status: AC
  Filled 2016-11-25: qty 1000

## 2016-11-25 MED ORDER — SODIUM CHLORIDE 0.9 % IV SOLN: INTRAVENOUS | Status: AC

## 2016-11-25 MED ORDER — HYDRALAZINE HCL 20 MG/ML IJ SOLN
INTRAMUSCULAR | Status: DC | PRN
  Administered 2016-11-25: 10 mg via INTRAVENOUS

## 2016-11-25 MED ORDER — SODIUM CHLORIDE 0.9 % WEIGHT BASED INFUSION: 1.0000 mL/kg/h | INTRAVENOUS | Status: DC

## 2016-11-25 MED ORDER — SODIUM CHLORIDE 0.9% FLUSH
3.0000 mL | Freq: Two times a day (BID) | INTRAVENOUS | Status: DC
  Administered 2016-11-25: 3 mL via INTRAVENOUS

## 2016-11-25 MED ORDER — IOPAMIDOL (ISOVUE-370) INJECTION 76%
INTRAVENOUS | Status: AC
  Filled 2016-11-25: qty 100

## 2016-11-25 MED ORDER — SODIUM CHLORIDE 0.9% FLUSH
3.0000 mL | Freq: Two times a day (BID) | INTRAVENOUS | Status: DC
  Administered 2016-11-25 – 2016-11-26 (×2): 3 mL via INTRAVENOUS

## 2016-11-25 SURGICAL SUPPLY — 18 items
CATH 5FR JL3.5 JR4 ANG PIG MP (CATHETERS) ×2 IMPLANT
CATH BALLN WEDGE 5F 110CM (CATHETERS) ×2 IMPLANT
CATH LANGSTON DUAL LUM PIG 6FR (CATHETERS) ×2 IMPLANT
CATH LAUNCHER 5F EBU4.0 (CATHETERS) ×2 IMPLANT
COVER PRB 48X5XTLSCP FOLD TPE (BAG) ×1 IMPLANT
COVER PROBE 5X48 (BAG) ×1
DEVICE RAD COMP TR BAND LRG (VASCULAR PRODUCTS) ×2 IMPLANT
GLIDESHEATH SLEND SS 6F .021 (SHEATH) ×2 IMPLANT
GUIDEWIRE INQWIRE 1.5J.035X260 (WIRE) ×1 IMPLANT
INQWIRE 1.5J .035X260CM (WIRE) ×2
KIT HEART LEFT (KITS) ×4 IMPLANT
PACK CARDIAC CATHETERIZATION (CUSTOM PROCEDURE TRAY) ×2 IMPLANT
SHEATH GLIDE SLENDER 4/5FR (SHEATH) ×2 IMPLANT
TRANSDUCER W/STOPCOCK (MISCELLANEOUS) ×4 IMPLANT
TUBING ART PRESS 72  MALE/FEM (TUBING) ×1
TUBING ART PRESS 72 MALE/FEM (TUBING) ×1 IMPLANT
TUBING CIL FLEX 10 FLL-RA (TUBING) ×2 IMPLANT
VALVE MANIFOLD 3 PORT W/RA/ON (MISCELLANEOUS) ×2 IMPLANT

## 2016-11-25 NOTE — Progress Notes (Signed)
Limited Upper Extremity Arterial Evaluation  Upper Extremity Right Left  Brachial Pressures 165, Tri 174, Tri  Radial Waveforms Tri Tri  Ulnar Waveforms Bi Bi  Palmar Arch (Allen's Test) Waveform reverses with radial compression and is unchanged with ulnar comression Waveform decreases greater than 50% with radial compression and increases less than 50% with ulnar compression   Lita Mains- RDMS, RVT 1:58 PM  11/25/2016

## 2016-11-25 NOTE — Care Management Important Message (Signed)
Important Message  Patient Details  Name: James Curtis MRN: [id number] Date of Birth: 04/27/1941   Medicare Important Message Given:  Yes    Nathen May 11/25/2016, 1:59 PM

## 2016-11-25 NOTE — Progress Notes (Signed)
      [address]       [place] [address]             [phone]     CARDIOTHORACIC SURGERY PROGRESS NOTE  2 Days Post-Op  S/P Procedure(s) (LRB): TRANSESOPHAGEAL ECHOCARDIOGRAM (TEE) (N/A)  Subjective: No complaints.  Had a good night.  Has decided that he would like to proceed with surgery.  Objective: Vital signs in last 24 hours: Temp:  [97.6 F (36.4 C)-98.3 F (36.8 C)] 97.7 F (36.5 C) (04/13 0503) Pulse Rate:  [60-75] 60 (04/13 0503) Cardiac Rhythm: Normal sinus rhythm (04/13 0750) Resp:  [18] 18 (04/13 0503) BP: (139-160)/(71-89) 139/71 (04/13 0503) SpO2:  [99 %-100 %] 99 % (04/13 0503)  Physical Exam:  Rhythm:   sinus  Breath sounds: clear  Heart sounds:  RRR w/ soft murmur  Incisions:  n/a  Abdomen:  soft  Extremities:  warm   Intake/Output from previous day: 04/12 0701 - 04/13 0700 In: 960 [P.O.:960] Out: 600 [Urine:600] Intake/Output this shift: No intake/output data recorded.  Lab Results: No results for input(s): WBC, HGB, HCT, PLT in the last 72 hours. BMET: No results for input(s): NA, K, CL, CO2, GLUCOSE, BUN, CREATININE, CALCIUM in the last 72 hours.  CBG (last 3)  No results for input(s): GLUCAP in the last 72 hours. PT/INR:  No results for input(s): LABPROT, INR in the last 72 hours.  CXR:  CHEST  2 VIEW  COMPARISON:  None.  FINDINGS: Placement of left pacer with leads in the right atrium and right ventricle. No pneumothorax. Heart is upper limits normal in size. Low lung volumes with bibasilar atelectasis. No effusions.  IMPRESSION: Left pacer placement without pneumothorax.  Bibasilar atelectasis.   Electronically Signed   By: Rolm Baptise M.D.   On: 11/22/2016 07:19  Assessment/Plan: S/P Procedure(s) (LRB): TRANSESOPHAGEAL ECHOCARDIOGRAM (TEE) (N/A)  Await diagnostic cardiac catheterization planned for later today. We tentatively plan to proceed with surgery on Friday 12/02/2016 or sooner if  schedule will allow.  Will get PFT's and preop dopplers done today.   Patient likely can go home later today or tomorrow after cath.  Discussed matters at length with Mr Mcadam and his son at the bedside.  All questions answered.  I spent in excess of 15 minutes during the conduct of this hospital encounter and >50% of this time involved direct face-to-face encounter with the patient for counseling and/or coordination of their care.   Rexene Alberts, MD 11/25/2016 9:26 AM

## 2016-11-25 NOTE — Interval H&P Note (Signed)
History and Physical Interval Note:  11/25/2016 2:59 PM  [name]  has presented today for cardiac catheterization, with the diagnosis of aortic stenosis  The various methods of treatment have been discussed with the patient and family. After consideration of risks, benefits and other options for treatment, the patient has consented to  Procedure(s): Right/Left Heart Cath and Coronary Angiography (N/A) as a surgical intervention .  The patient's history has been reviewed, patient examined, no change in status, stable for surgery.  I have reviewed the patient's chart and labs.  Questions were answered to the patient's satisfaction.    Cath Lab Visit (complete for each Cath Lab visit)  Clinical Evaluation Leading to the Procedure:   ACS: No.  Non-ACS:    Anginal Classification: No Symptoms  Anti-ischemic medical therapy: No Therapy  Non-Invasive Test Results: No non-invasive testing performed  Prior CABG: No previous CABG  James Curtis

## 2016-11-25 NOTE — Progress Notes (Signed)
VASCULAR LAB PRELIMINARY  ARTERIAL  ABI completed:    RIGHT    LEFT    PRESSURE WAVEFORM  PRESSURE WAVEFORM  BRACHIAL 165 Tri BRACHIAL 174 Tri  DP 128 mono DP 159 Dampened mono  PT 142 mono PT 185 biphasic    RIGHT LEFT  ABI 0.82 1.06    ABI's of the right lower extremity indicate mild/boarderline moderate peripheral arterial disease with monophasic flow.  ABI's of the left lower extremity are within normal limits however the dorsal pedis demonstrates dampened monophasic flow.  Everrett Coombe, RVT 11/25/2016, 1:41 PM

## 2016-11-25 NOTE — Progress Notes (Signed)
I was stopped by patient's son in hallway to discuss conversations this morning. He is frustrated that he was under the impression that it was urgent that a decision be made regarding surgery and now surgery will be scheduled for next Friday. He thinks that he has been given "double talk" regarding options for surgery and appears to be frustrated with the process. He also doesn't understand why we needed a decision for surgery so that we could decide if we would proceed with heart catheterization.  I explained today that if the patient and family's decision was to not proceed with surgery then there would have been no need to obtain more information about coronary status or valves. I have tried to again outline the rationale for why decisions were made as they were and the timeline for procedures. I am not sure that he was accepting of my explanations today despite many attempts to explain rationales. I have encouraged him to discuss further with Dr Roxy Manns after catheterization when we have more information.   [name], NP 11/25/2016 9:45 AM

## 2016-11-25 NOTE — Brief Op Note (Signed)
Brief Cardiac Catheterization Note (Full Report to Follow)  Date: 11/25/2016 Time: 4:48 PM  PATIENT:  James Curtis  76 y.o. male  PRE-OPERATIVE DIAGNOSIS:  aortic stenosis  POST-OPERATIVE DIAGNOSIS:  same  PROCEDURE:  Procedure(s): Right/Left Heart Cath and Coronary Angiography (N/A)  SURGEON:  Surgeon(s) and Role:    * Severina Sykora, MD - Primary  FINDINGS: 1.  Mild to moderate, non-obstructive coronary artery disease 2.  Mild to moderate aortic valve stenosis. 3.  Normal left and right heart filling pressures. 4.  Normal Fick cardiac output. 5.  Systemic hypertension.  RECOMMENDATIONS: 1.  Primary prevention for ASCVD, including improved blood pressure control. 2.  TR band protocol.  Nelva Bush, MD [facility] Pager: [phone]

## 2016-11-25 NOTE — Progress Notes (Signed)
SUBJECTIVE: The patient is doing well today.  At this time, James denies chest pain, shortness of breath, or any new concerns. TEE showed a 16x22mm mass on the mitral valve. James James Curtis saw yesterday and recommended surgical removal with possible AVR as well.  Plan for L/R heart cath today  CURRENT MEDICATIONS: . aspirin EC  325 mg Oral Daily  . cholecalciferol  1,000 Units Oral Daily  . heparin  5,000 Units Subcutaneous Q8H  . hydrALAZINE  25 mg Oral Q8H  . irbesartan  300 mg Oral Daily  . simvastatin  20 mg Oral QHS     OBJECTIVE: Physical Exam: Vitals:   11/24/16 0615 11/24/16 1313 11/24/16 2227 11/25/16 0503  BP: (!) 154/73 (!) 145/89 (!) 160/83 139/71  Pulse: 63 65 75 60  Resp: 18 18 18 18   Temp: 97.7 F (36.5 C) 97.6 F (36.4 C) 98.3 F (36.8 C) 97.7 F (36.5 C)  TempSrc: Oral Oral Oral Oral  SpO2: 97% 99% 100% 99%  Weight:      Height:        Intake/Output Summary (Last 24 hours) at 11/25/16 0738 Last data filed at 11/25/16 0504  Gross per 24 hour  Intake              960 ml  Output              600 ml  Net              360 ml    Telemetry reveals AV pacing (personally reviewed)  GEN- The patient is elderly and obese appearing, alert and oriented x 3 today.   Head- normocephalic, atraumatic Eyes-  Sclera clear, conjunctiva pink Ears- hearing intact Oropharynx- clear Neck- supple  Lungs- Clear to ausculation bilaterally, normal work of breathing Heart- Regular rate and rhythm (paced) GI- soft, NT, ND, + BS Extremities- no clubbing, cyanosis, or edema Skin- no rash or lesion, left chest without hematoma/ecchymosis  Psych- euthymic mood, full affect Neuro- strength and sensation are intact   RADIOLOGY: Dg Chest 2 View Result Date: 11/22/2016 CLINICAL DATA:  Pacer placement EXAM: CHEST  2 VIEW COMPARISON:  None. FINDINGS: Placement of left pacer with leads in the right atrium and right ventricle. No pneumothorax. Heart is upper limits normal in size. Low lung  volumes with bibasilar atelectasis. No effusions. IMPRESSION: Left pacer placement without pneumothorax. Bibasilar atelectasis. Electronically Signed   By: James Curtis JamesD.   On: 11/22/2016 07:19   Mr James Curtis CL Contrast Result Date: 11/19/2016 CLINICAL DATA:  76 y/o M; episode of slurred speech and right facial droop with onset today. EXAM: MRI HEAD WITHOUT CONTRAST MRA HEAD WITHOUT CONTRAST TECHNIQUE: Multiplanar, multiecho pulse sequences of the brain and surrounding structures were obtained without intravenous contrast. Angiographic images of the head were obtained using MRA technique without contrast. COMPARISON:  08/11/2016 MRI of the brain. FINDINGS: MRI HEAD FINDINGS Moderate motion artifact degrades several sequences. Brain: No acute infarction, hemorrhage, hydrocephalus, extra-axial collection or mass lesion. Stable mild chronic microvascular ischemic changes and mild parenchymal volume loss of the brain. Stable small chronic lacunar infarct within left hemi pons. Vascular: As below. Skull and upper cervical spine: Normal marrow signal. Sinuses/Orbits: Negative. Other: None. MRA HEAD FINDINGS Internal carotid arteries: Patent. Lumen irregularity of bilateral cavernous and paraclinoid segments without high-grade stenosis is likely related to atherosclerotic calcification. Anterior cerebral arteries:  Patent. Middle cerebral arteries: Patent. Anterior communicating artery: Patent. Posterior communicating arteries: Probable diminutive left. No  right identified, likely hypoplastic or absent. Posterior cerebral arteries:  Patent. Basilar artery:  Patent. Vertebral arteries:  Patent. No high-grade stenosis or aneurysm identified. Mild motion artifact, suboptimal assessment for subtle stenosis or small 2-3 mm aneurysm. IMPRESSION: 1. No acute intracranial abnormality identified. 2. Stable mild chronic microvascular ischemic changes and mild parenchymal volume loss of the brain. Stable small chronic lacunar  infarct in left hemi pons. 3. Patent circle of Willis. No high-grade stenosis, large vessel occlusion, or aneurysm identified. Electronically Signed   By: James Curtis JamesD.   On: 11/19/2016 00:33   ASSESSMENT AND PLAN:  Active Problems:   Complete heart block (HCC)   TIA (transient ischemic attack)   Cardiac pacemaker in situ   Mitral valve vegetation   Essential hypertension   Aortic stenosis   Dementia   Left atrial mass  1.  Complete heart block Doing well s/p pacemaker Normal device function   2.  TIA See below  Appreciate neurology Continue ASA   3.  HTN Stable No change required today  4. Left atrial mass James James Curtis saw patient yesterday and recommended removal. The patient and his son have discussed and would like to proceed. James Curtis need right and left heart cath today prior to surgery. Risks, benefits reviewed with patient and son who wish to proceed. James Curtis plan for later today with James Curtis.  5.  Aortic stenosis Moderate by TTE Not evaluated by TEE James Curtis evaluate further by cath today  Likely AVR at time of left atrial mass removal   As EP issues are stable, James Curtis ask general cardiology to take over care starting tomorrow.  James Curtis arrange EP follow for pacemaker.  James Curtis need a general cardiologist going forward. James Curtis refer to James. Saunders Curtis for long term management as James [name] be doing cath today.   James Marshall, NP 11/25/2016 7:44 AM  James have seen and examined this patient with James Curtis.  Agree with above, note added to reflect my findings.  On exam, RRR, 2/6 systolic murmur at the base, lungs clear. Found to have mass on mitral valve. Plan for surgery next week with James. Roxy Curtis. LHC, RHC and PFTs to be done today to further evaluate the aortic valve and coronaries. Teighlor Korson possibly need an AVR during the procedure. If stable post cath, may be able to be discharged home and come back for surgery.     Billiejo Curtis M. Kinzi Frediani MD 11/25/2016 9:13 AM

## 2016-11-25 NOTE — H&P (View-Only) (Signed)
SUBJECTIVE: The patient is doing well today.  At this time, he denies chest pain, shortness of breath, or any new concerns. TEE showed a 16x18mm mass on the mitral valve. Dr Roxy Manns saw yesterday and recommended surgical removal with possible AVR as well.  Plan for L/R heart cath today  CURRENT MEDICATIONS: . aspirin EC  325 mg Oral Daily  . cholecalciferol  1,000 Units Oral Daily  . heparin  5,000 Units Subcutaneous Q8H  . hydrALAZINE  25 mg Oral Q8H  . irbesartan  300 mg Oral Daily  . simvastatin  20 mg Oral QHS     OBJECTIVE: Physical Exam: Vitals:   11/24/16 0615 11/24/16 1313 11/24/16 2227 11/25/16 0503  BP: (!) 154/73 (!) 145/89 (!) 160/83 139/71  Pulse: 63 65 75 60  Resp: 18 18 18 18   Temp: 97.7 F (36.5 C) 97.6 F (36.4 C) 98.3 F (36.8 C) 97.7 F (36.5 C)  TempSrc: Oral Oral Oral Oral  SpO2: 97% 99% 100% 99%  Weight:      Height:        Intake/Output Summary (Last 24 hours) at 11/25/16 0738 Last data filed at 11/25/16 0504  Gross per 24 hour  Intake              960 ml  Output              600 ml  Net              360 ml    Telemetry reveals AV pacing (personally reviewed)  GEN- The patient is elderly and obese appearing, alert and oriented x 3 today.   Head- normocephalic, atraumatic Eyes-  Sclera clear, conjunctiva pink Ears- hearing intact Oropharynx- clear Neck- supple  Lungs- Clear to ausculation bilaterally, normal work of breathing Heart- Regular rate and rhythm (paced) GI- soft, NT, ND, + BS Extremities- no clubbing, cyanosis, or edema Skin- no rash or lesion, left chest without hematoma/ecchymosis  Psych- euthymic mood, full affect Neuro- strength and sensation are intact   RADIOLOGY: Dg Chest 2 View Result Date: 11/22/2016 CLINICAL DATA:  Pacer placement EXAM: CHEST  2 VIEW COMPARISON:  None. FINDINGS: Placement of left pacer with leads in the right atrium and right ventricle. No pneumothorax. Heart is upper limits normal in size. Low lung  volumes with bibasilar atelectasis. No effusions. IMPRESSION: Left pacer placement without pneumothorax. Bibasilar atelectasis. Electronically Signed   By: Rolm Baptise M.D.   On: 11/22/2016 07:19   Mr Virgel Paling ID Contrast Result Date: 11/19/2016 CLINICAL DATA:  76 y/o M; episode of slurred speech and right facial droop with onset today. EXAM: MRI HEAD WITHOUT CONTRAST MRA HEAD WITHOUT CONTRAST TECHNIQUE: Multiplanar, multiecho pulse sequences of the brain and surrounding structures were obtained without intravenous contrast. Angiographic images of the head were obtained using MRA technique without contrast. COMPARISON:  08/11/2016 MRI of the brain. FINDINGS: MRI HEAD FINDINGS Moderate motion artifact degrades several sequences. Brain: No acute infarction, hemorrhage, hydrocephalus, extra-axial collection or mass lesion. Stable mild chronic microvascular ischemic changes and mild parenchymal volume loss of the brain. Stable small chronic lacunar infarct within left hemi pons. Vascular: As below. Skull and upper cervical spine: Normal marrow signal. Sinuses/Orbits: Negative. Other: None. MRA HEAD FINDINGS Internal carotid arteries: Patent. Lumen irregularity of bilateral cavernous and paraclinoid segments without high-grade stenosis is likely related to atherosclerotic calcification. Anterior cerebral arteries:  Patent. Middle cerebral arteries: Patent. Anterior communicating artery: Patent. Posterior communicating arteries: Probable diminutive left. No  right identified, likely hypoplastic or absent. Posterior cerebral arteries:  Patent. Basilar artery:  Patent. Vertebral arteries:  Patent. No high-grade stenosis or aneurysm identified. Mild motion artifact, suboptimal assessment for subtle stenosis or small 2-3 mm aneurysm. IMPRESSION: 1. No acute intracranial abnormality identified. 2. Stable mild chronic microvascular ischemic changes and mild parenchymal volume loss of the brain. Stable small chronic lacunar  infarct in left hemi pons. 3. Patent circle of Willis. No high-grade stenosis, large vessel occlusion, or aneurysm identified. Electronically Signed   By: Kristine Garbe M.D.   On: 11/19/2016 00:33   ASSESSMENT AND PLAN:  Active Problems:   Complete heart block (HCC)   TIA (transient ischemic attack)   Cardiac pacemaker in situ   Mitral valve vegetation   Essential hypertension   Aortic stenosis   Dementia   Left atrial mass  1.  Complete heart block Doing well s/p pacemaker Normal device function   2.  TIA See below  Appreciate neurology Continue ASA   3.  HTN Stable No change required today  4. Left atrial mass Dr Roxy Manns saw patient yesterday and recommended removal. The patient and his son have discussed and would like to proceed. Pt Margaruite Top need right and left heart cath today prior to surgery. Risks, benefits reviewed with patient and son who wish to proceed. Shondell Fabel plan for later today with Dr End.  5.  Aortic stenosis Moderate by TTE Not evaluated by TEE Heriberto Stmartin evaluate further by cath today  Likely AVR at time of left atrial mass removal   As EP issues are stable, Aleeya Veitch ask general cardiology to take over care starting tomorrow.  I Kayia Billinger arrange EP follow for pacemaker.  He Cinthia Rodden need a general cardiologist going forward. Romilda Proby refer to Dr. Saunders Revel for long term management as he Daliya Parchment be doing cath today.   [name], NP 11/25/2016 7:44 AM  I have seen and examined this patient with [name].  Agree with above, note added to reflect my findings.  On exam, RRR, 2/6 systolic murmur at the base, lungs clear. Found to have mass on mitral valve. Plan for surgery next week with Dr. Roxy Manns. LHC, RHC and PFTs to be done today to further evaluate the aortic valve and coronaries. Sasha Rueth possibly need an AVR during the procedure. If stable post cath, may be able to be discharged home and come back for surgery.     Lisa Blakeman M. Avaeh Ewer MD 11/25/2016 9:13 AM

## 2016-11-26 ENCOUNTER — Inpatient Hospital Stay (HOSPITAL_COMMUNITY): Payer: Medicare Other

## 2016-11-26 ENCOUNTER — Encounter (HOSPITAL_COMMUNITY): Payer: Self-pay | Admitting: Nurse Practitioner

## 2016-11-26 DIAGNOSIS — I2510 Atherosclerotic heart disease of native coronary artery without angina pectoris: Secondary | ICD-10-CM

## 2016-11-26 DIAGNOSIS — I519 Heart disease, unspecified: Secondary | ICD-10-CM

## 2016-11-26 DIAGNOSIS — I350 Nonrheumatic aortic (valve) stenosis: Secondary | ICD-10-CM

## 2016-11-26 MED ORDER — ASPIRIN EC 325 MG PO TBEC
325.0000 mg | DELAYED_RELEASE_TABLET | Freq: Every day | ORAL | Status: DC
Start: 2016-11-26 — End: 2017-04-12

## 2016-11-26 MED ORDER — IOPAMIDOL (ISOVUE-370) INJECTION 76%
INTRAVENOUS | Status: AC
  Administered 2016-11-26: 100 mL
  Filled 2016-11-26: qty 100

## 2016-11-26 NOTE — Progress Notes (Signed)
Went to see if pt wanted to ambulate, but he declined.  Son was not present for pre-op education and pt stated he wasn't sure when his son would be back.  Will try to follow up later today.   Cleda Mccreedy, MS ACSM RCEP 11/26/2016 10:16

## 2016-11-26 NOTE — Progress Notes (Signed)
T[facility] BRIEF PROGRESS NOTE  1 Day Post-Op  S/P Procedure(s) (LRB): Right/Left Heart Cath and Coronary Angiography (N/A)   Results of cath noted  Plan: Will obtain CTA chest/abd/pelvis to evaluate the feasibility of peripheral arterial cannulation for surgery  Rexene Alberts, MD 11/26/2016 6:23 AM

## 2016-11-26 NOTE — Discharge Summary (Signed)
Discharge Summary    Patient ID: James Curtis,  MRN: [id number], DOB/AGE: 77-Sep-1942 77 y.o.  Admit date: 11/18/2016 Discharge date: 11/26/2016  Primary Care Provider: Kandice Hams Primary Cardiologist: Andree Coss, MD / Elliot Cousin, MD  Discharge Diagnoses    Principal Problem:   Complete heart block [facility])  **s/p MDT PPM this admission.  Active Problems:   TIA (transient ischemic attack)  **In setting of CHB with subsequent finding of MV mass (? Embolic source)   Mitral valve mass  **16 x 4 mm mobile echodensity on atrial basal surface of the posterior MV.   Essential hypertension   Aortic stenosis   Dementia   Non-obstructive CAD (coronary artery disease)  *by catheterization this admission.  Allergies No Known Allergies  Diagnostic Studies/Procedures    2D Echocardiogram 4.8.2018  Study Conclusions   - Left ventricle: The cavity size was normal. There was mild   concentric hypertrophy. Systolic function was vigorous. The   estimated ejection fraction was in the range of 65% to 70%. Wall   motion was normal; there were no regional wall motion   abnormalities. Doppler parameters are consistent with abnormal   left ventricular relaxation (grade 1 diastolic dysfunction). - Aortic valve: Valve mobility was restricted. There was moderate   stenosis. Mean gradient (S): 26 mm Hg. Peak gradient (S): 62 mm   Hg. Valve area (VTI): 1.75 cm^2. Valve area (Vmax): 1.31 cm^2.   Valve area (Vmean): 1.54 cm^2. - Aortic root: The aortic root was normal in size. - Mitral valve: Severely calcified annulus. Severely thickened,   moderately calcified leaflets . Leaflet separation was mildly   reduced. The findings are consistent with moderate stenosis.   There was mild regurgitation. Valve area by pressure half-time:   1.16 cm^2. Valve area by continuity equation (using LVOT flow):   1.65 cm^2. - Left atrium: The atrium was mildly dilated. - Right ventricle: Systolic function was  normal. - Right atrium: The atrium was normal in size. - Tricuspid valve: There was mild regurgitation. - Pulmonary arteries: Systolic pressure was within the normal   range. - Inferior vena cava: The vessel was normal in size. - Pericardium, extracardiac: There was no pericardial effusion. _____________  Carotid U/S 4.8.2018  Summary: Right: mild mixed plaque origin ICA. Left: moderate calcific plaque origin ICA. Bilateral: 1-39% ICA stenosis. Tortuous vessels. Vertebral artery flow is antegrade. _____________  Permanent Pacemaker Placement 4.9.2018  Medtronic Azure XT DR MRI SureScan dual-chamber permanent pacemaker (serial number [id number])  _____________   Transesophageal Echocardiogram 4.11.2018  Study Conclusions   - Left ventricle: The cavity size was normal. There was severe   concentric hypertrophy. Systolic function was normal. The   estimated ejection fraction was in the range of 60% to 65%. - Aortic valve: Cusp separation was reduced. Noncoronary cusp   mobility was restricted. - Mitral valve: Mildly calcified annulus. Mildly thickened leaflets   . There was a 16 x 4 mm mobile calcified echodensity on the   atrial basal surface of the posterior mitral valve. Differential   includes fibroelastoma or vegetation (checking blood cultures).   Chronic vegetations are usually calcified as this mass currently   demonstrates, however they are not as mobile as we are seeing.   There was mild regurgitation. - Left atrium: No evidence of thrombus in the atrial cavity or   appendage. No evidence of thrombus in the appendage. - Right atrium: Pacer wire or catheter noted in right atrium. No   evidence  of thrombus in the atrial cavity or appendage. - Tricuspid valve: No evidence of vegetation. - Pulmonic valve: No evidence of vegetation.   Recommendations:  This procedure has been discussed with the referring physician. _____________   Cardiac Catheterization  4.13.2018 Coronary Findings  Dominance: Right  Left Main  Vessel is large.  Left Anterior Descending  Vessel is large. The vessel exhibits minimal luminal irregularities.  First Diagonal Branch  Vessel is moderate in size.  Second Diagonal Branch  Vessel is moderate in size.  Ramus Intermedius  Vessel is small.  Left Circumflex  Vessel is large. There is mild the vessel.  First Obtuse Marginal Branch  Vessel is moderate in size.  Second Obtuse Marginal Branch  Vessel is small in size.  Third Obtuse Marginal Branch  Vessel is large in size.  3rd Mrg lesion, 40% stenosed. The lesion is focal and eccentric.  Right Coronary Artery  Vessel is large.  Prox RCA to Mid RCA lesion, 50% stenosed. The lesion is irregular. The proximal RCA is ectatic.  Right Posterior Descending Artery  Vessel is moderate in size.  Right Posterior Atrioventricular Branch  Vessel is small in size.   Right Heart  Right Heart Pressures RA (mean): 4 mmHg RV (S/EDP): 35/5 mmHg PA (S/D, mean): 35/10 (19) mmHg PCWP (mean): 9 mmHg  Ao sat: 91% PA sat: 67%  Fick CO: 7.6 L/min Fick CI: 3.6 L/min/m^2  PVR: 1.3 Wood units     Left Heart  Left Ventricle LV end diastolic pressure is normal. LVEDP 10 mmHg.    Mitral Valve The annulus is calcified.    Aortic Valve Mild to moderate aortic stenosis: - average mean gradient with dual lumen catheter is 22 mmHg; AVA 2.0 cm^2 - mean gradient by pullback is 16 mmHg; AVA 2.3 cm^2    _____________   CT Angio of the chest/abd/pelvis 4.14.2018  Results pending. _____________   History of Present Illness     76 year old male with a prior history of hypertension, hyperlipidemia, diet-controlled diabetes, and tobacco abuse. He was evaluated by his primary care provider on April 4 and noted be hypotensive and bradycardic. Carvedilol therapy was discontinued. On the morning of April 6, he was noted by family members to exhibit slurred speech and infusion. He also  had right-sided facial droop. Symptoms resolved after 10 minutes. Patient was taken to the emergency room where his EKG showed complete heart block with a rate of 40. He was seen by [facility] physiology in the ER with plan to hold AV nodal blocking agents and add milk for further cardiac and neurologic evaluation.  Hospital Course     Consultants: neurology; cardiothoracic surgery   Following admission, patient remained hemodynamically stable on dobutamine infusion. He was seen by neurology and underwent carotid ultrasound, which did not show any significant disease. MRI of the brain showed no acute intracranial abnormality with stable, mild, chronic microvascular ischemic changes and mild parenchymal volume loss of the brain. There was a small, stable chronic lacunar infarct in the left hemi-pons. Echocardiogram was performed and showed normal LV function with moderate aortic stenosis, moderate mitral stenosis, and mild mitral regurgitation. Neurology did recommend transesophageal echocardiogram in the setting of TIA symptoms.  Patient was seen by electrophysiology and on April 9, he underwent successful placement of a Medtronic Azure XT DR MRI SureScan dual-chamber permanent pacemaker. Patient tolerated procedure well. On April 11, transesophageal echocardiogram was performed and again showed normal LV function, however it was now noted that patient had a  16 x 4 mm mobile, calcified echodensity on the atrial base surface of the posterior mitral valve. Blood cultures were evaluated and have shown no growth to date. 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 Further, it was felt that surgical intervention would be appropriate given recent TIA with possible embolic source.  Mr. Peerson then underwent diagnostic cardiac catheterization on April 13, revealing nonobstructive coronary artery disease with normal filling pressures.  Mean aortic valve gradient with dual lumen catheter was 22 mmHg with an aortic valve area of 2.0 cm. Given this finding, it is not clear that patient will require aortic valve intervention at this time however, as he may require aortic valve intervention in the future, CT angios the chest, abdomen, and pelvis was performed to assess his peripheral anatomy and determine whether or not he would be a candidate for a transcatheter aortic valve replacement at some point in the future if required.  CT results are currently pending.  Mr. Poirier is felt to be stable for discharge today. He has follow-up with thoracic surgery on April 17 to further discuss surgical plan. He is tentatively scheduled for resection of mitral valve mass on April 20. _____________  Discharge Vitals Blood pressure 127/63, pulse 60, temperature 98.2 F (36.8 C), temperature source Oral, resp. rate 20, height 5\' 7"  (1.702 m), weight 217 lb 11.2 oz (98.7 kg), SpO2 100 %.  Filed Weights   11/18/16 2100 11/20/16 0320  Weight: 224 lb 13.9 oz (102 kg) 217 lb 11.2 oz (98.7 kg)    Labs & Radiologic Studies    CBC  Recent Labs  11/25/16 1527  HGB 11.2*  HCT 46.5*   Basic Metabolic Panel  Recent Labs  11/25/16 1527  NA 143  K 3.9  CL 106  GLUCOSE 101*  BUN 17  CREATININE 1.30*   Liver Function Tests Lab Results  Component Value Date   ALT 15 (L) 11/18/2016   AST 24 11/18/2016   ALKPHOS 65 11/18/2016   BILITOT 1.3 (H) 11/18/2016     Hemoglobin A1C Lab Results  Component Value Date   HGBA1C 6.2 (H) 11/19/2016    Fasting Lipid Panel Lab Results  Component Value Date   CHOL 106 11/19/2016   HDL 27 (L) 11/19/2016   LDLCALC 60 11/19/2016   TRIG 97 11/19/2016   CHOLHDL 3.9 11/19/2016     _____________  Dg Chest 2 View  Result Date: 11/22/2016 CLINICAL DATA:  Pacer placement EXAM: CHEST  2 VIEW COMPARISON:  None. FINDINGS: Placement of left pacer with leads in the right atrium and right ventricle. No  pneumothorax. Heart is upper limits normal in size. Low lung volumes with bibasilar atelectasis. No effusions. IMPRESSION: Left pacer placement without pneumothorax. Bibasilar atelectasis. Electronically Signed   By: Rolm Baptise M.D.   On: 11/22/2016 07:19   Mr Virgel Paling KC Contrast  Result Date: 11/19/2016 CLINICAL DATA:  76 y/o M; episode of slurred speech and right facial droop with onset today. EXAM: MRI HEAD WITHOUT CONTRAST MRA HEAD WITHOUT CONTRAST TECHNIQUE: Multiplanar, multiecho pulse sequences of the brain and surrounding structures were obtained without intravenous contrast. Angiographic images of the head were obtained using MRA technique without contrast. COMPARISON:  08/11/2016 MRI of the brain. FINDINGS: MRI HEAD FINDINGS Moderate motion artifact degrades several sequences. Brain: No acute infarction, hemorrhage, hydrocephalus, extra-axial collection or mass lesion. Stable mild chronic microvascular ischemic changes and mild parenchymal volume loss of the brain.  Stable small chronic lacunar infarct within left hemi pons. Vascular: As below. Skull and upper cervical spine: Normal marrow signal. Sinuses/Orbits: Negative. Other: None. MRA HEAD FINDINGS Internal carotid arteries: Patent. Lumen irregularity of bilateral cavernous and paraclinoid segments without high-grade stenosis is likely related to atherosclerotic calcification. Anterior cerebral arteries:  Patent. Middle cerebral arteries: Patent. Anterior communicating artery: Patent. Posterior communicating arteries: Probable diminutive left. No right identified, likely hypoplastic or absent. Posterior cerebral arteries:  Patent. Basilar artery:  Patent. Vertebral arteries:  Patent. No high-grade stenosis or aneurysm identified. Mild motion artifact, suboptimal assessment for subtle stenosis or small 2-3 mm aneurysm. IMPRESSION: 1. No acute intracranial abnormality identified. 2. Stable mild chronic microvascular ischemic changes and mild  parenchymal volume loss of the brain. Stable small chronic lacunar infarct in left hemi pons. 3. Patent circle of Willis. No high-grade stenosis, large vessel occlusion, or aneurysm identified. Electronically Signed   By: Kristine Garbe M.D.   On: 11/19/2016 00:33   Mr Brain Wo Contrast  Result Date: 11/19/2016 CLINICAL DATA:  76 y/o M; episode of slurred speech and right facial droop with onset today. EXAM: MRI HEAD WITHOUT CONTRAST MRA HEAD WITHOUT CONTRAST TECHNIQUE: Multiplanar, multiecho pulse sequences of the brain and surrounding structures were obtained without intravenous contrast. Angiographic images of the head were obtained using MRA technique without contrast. COMPARISON:  08/11/2016 MRI of the brain. FINDINGS: MRI HEAD FINDINGS Moderate motion artifact degrades several sequences. Brain: No acute infarction, hemorrhage, hydrocephalus, extra-axial collection or mass lesion. Stable mild chronic microvascular ischemic changes and mild parenchymal volume loss of the brain. Stable small chronic lacunar infarct within left hemi pons. Vascular: As below. Skull and upper cervical spine: Normal marrow signal. Sinuses/Orbits: Negative. Other: None. MRA HEAD FINDINGS Internal carotid arteries: Patent. Lumen irregularity of bilateral cavernous and paraclinoid segments without high-grade stenosis is likely related to atherosclerotic calcification. Anterior cerebral arteries:  Patent. Middle cerebral arteries: Patent. Anterior communicating artery: Patent. Posterior communicating arteries: Probable diminutive left. No right identified, likely hypoplastic or absent. Posterior cerebral arteries:  Patent. Basilar artery:  Patent. Vertebral arteries:  Patent. No high-grade stenosis or aneurysm identified. Mild motion artifact, suboptimal assessment for subtle stenosis or small 2-3 mm aneurysm. IMPRESSION: 1. No acute intracranial abnormality identified. 2. Stable mild chronic microvascular ischemic changes  and mild parenchymal volume loss of the brain. Stable small chronic lacunar infarct in left hemi pons. 3. Patent circle of Willis. No high-grade stenosis, large vessel occlusion, or aneurysm identified. Electronically Signed   By: Kristine Garbe M.D.   On: 11/19/2016 00:33   Disposition   Pt is being discharged home today in good condition.  Follow-up Plans & Appointments    Follow-up Information    [facility] Follow up on 12/01/2016.   Specialty:  Cardiology Why:  at [facility] for wound check  Contact information: [address], [place] [phone]       Will [name], MD Follow up on 02/22/2017.   Specialty:  Cardiology Why:  at 8:30AM Contact information: [address] [place] [address] [phone]        Xu,Jindong, MD. Schedule an appointment as soon as possible for a visit in 6 week(s).   Specialty:  Neurology Contact information: [address] [place] [address] [phone]        Rexene Alberts, MD Follow up on 11/29/2016.   Specialty:  Cardiothoracic Surgery Why:  12:00 PM Contact information:  [address] [place] [address] [phone]            Discharge Medications   Current Discharge Medication List    CONTINUE these medications which have CHANGED   Details  aspirin EC 325 MG tablet Take 1 tablet (325 mg total) by mouth daily.      CONTINUE these medications which have NOT CHANGED   Details  amLODipine (NORVASC) 10 MG tablet Take 10 mg by mouth daily. Refills: 11    Apoaequorin (PREVAGEN PO) Take 1 tablet by mouth daily. For Brain and Memory Health - OTC    cholecalciferol (VITAMIN D) 1000 units tablet Take 1,000 Units by mouth daily.    ibuprofen (ADVIL,MOTRIN) 200 MG tablet Take 200 mg by mouth every 6 (six) hours as needed for headache (pain).    simvastatin (ZOCOR) 20 MG tablet Take 20 mg by mouth at  bedtime.     Tetrahydrozoline HCl (VISINE OP) Place 1 drop into both eyes daily as needed (dry eyes).    valsartan (DIOVAN) 320 MG tablet Take 320 mg by mouth daily.         Outstanding Labs/Studies   None  Duration of Discharge Encounter   Greater than 30 minutes including physician time.  Signed, Murray Hodgkins NP 11/26/2016, 2:15 PM

## 2016-11-26 NOTE — Progress Notes (Signed)
Progress Note  Patient Name: James Curtis Date of Encounter: 11/26/2016  Primary Cardiologist: Dr. Saunders Revel (will be new)  Subjective   No chest pain.  No SOB.   Inpatient Medications    Scheduled Meds: . aspirin EC  325 mg Oral Daily  . cholecalciferol  1,000 Units Oral Daily  . heparin  5,000 Units Subcutaneous Q8H  . hydrALAZINE  25 mg Oral Q8H  . irbesartan  300 mg Oral Daily  . simvastatin  20 mg Oral QHS  . sodium chloride flush  3 mL Intravenous Q12H   Continuous Infusions:  PRN Meds: sodium chloride, acetaminophen, nitroGLYCERIN, ondansetron (ZOFRAN) IV, polyvinyl alcohol, sodium chloride flush   Vital Signs    Vitals:   11/25/16 2043 11/25/16 2200 11/26/16 0032 11/26/16 0445  BP: (!) 149/73 127/70 139/68 127/63  Pulse: 65 66  60  Resp: 18 18  20   Temp: 98.1 F (36.7 C) 98.1 F (36.7 C)  98.2 F (36.8 C)  TempSrc: Oral Oral  Oral  SpO2: 100% 98%  100%  Weight:      Height:        Intake/Output Summary (Last 24 hours) at 11/26/16 0745 Last data filed at 11/26/16 0445  Gross per 24 hour  Intake              480 ml  Output              480 ml  Net                0 ml   Filed Weights   11/18/16 2100 11/20/16 0320  Weight: 224 lb 13.9 oz (102 kg) 217 lb 11.2 oz (98.7 kg)    Telemetry    AV paced rhythm - Personally Reviewed  ECG    NA - Personally Reviewed  Physical Exam   GEN: No acute distress.   Neck: No  JVD Cardiac: RRR, 3/6 apical systolic murmur no diastolic murmurs, rubs, or gallops.  Respiratory: Clear  to auscultation bilaterally. GI: Soft, nontender, non-distended  MS: No  edema; No deformity. Neuro:  Nonfocal  Psych: Normal affect .    Labs    Chemistry Recent Labs Lab 11/25/16 1527  NA 143  K 3.9  CL 106  GLUCOSE 101*  BUN 17  CREATININE 1.30*     Hematology Recent Labs Lab 11/25/16 1527  HGB 11.2*  HCT 33.0*    Cardiac EnzymesNo results for input(s): TROPONINI in the last 168 hours. No results for  input(s): TROPIPOC in the last 168 hours.   BNPNo results for input(s): BNP, PROBNP in the last 168 hours.   DDimer No results for input(s): DDIMER in the last 168 hours.   Radiology    No results found.  Cardiac Studies   Cath:  FINDINGS: 1.  Mild to moderate, non-obstructive coronary artery disease 2.  Mild to moderate aortic valve stenosis. 3.  Normal left and right heart filling pressures. 4.  Normal Fick cardiac output. 5.  Systemic hypertension.  RECOMMENDATIONS: 1.  Primary prevention for ASCVD, including improved blood pressure control. 2.  TR band protocol.  Patient Profile     76 y.o. male with CHB now status post pacemaker.  Also with CVA and mitral valve mass and AS.    Assessment & Plan    CHB:  Doing well status post pacer placement.    HTN:  BP is stable.   MITRAL VALVE MASS:  CTA chest/abd/pelvis planned.  Surgery is tentatively planned  for Friday of next week.  No significant AS on cath.    (Of note I personally reviewed the cath and echo images.)   AVR was considered.  However, there was no significant AS noted on cath.  I spoke this morning with Dr. Roxy Manns.  He would like the CT ext to decide if the patient would be a TAVR candidate in the future.  If so we might do the MV surgery only at this time and follow his AS.  I tried to talk to the patient about this but he is angry about needing a CT and did not seem to understand all of the discussion.  We could not get his son on the phone.  Likely go home tomorrow.    TOBACCO ABUSE:  Educated.    Signed, Minus Breeding, MD  11/26/2016, 7:45 AM

## 2016-11-26 NOTE — Discharge Instructions (Signed)
Supplemental Discharge Instructions for  Pacemaker/Defibrillator Patients  Activity No heavy lifting or vigorous activity with your left/right arm for 6 to 8 weeks.  Do not raise your left/right arm above your head for one week.  Gradually raise your affected arm as drawn below.           __ 11/26/16             11/27/16               11/28/16                 11/29/16  NO DRIVING for   1 week  ; you may begin driving on   9/62/83  .  WOUND CARE - Keep the wound area clean and dry.  Do not get this area wet for one week. No showers for one week; you may shower on  11/29/16   . - The tape/steri-strips on your wound will fall off; do not pull them off.  No bandage is needed on the site.  DO  NOT apply any creams, oils, or ointments to the wound area. - If you notice any drainage or discharge from the wound, any swelling or bruising at the site, or you develop a fever > 101? F after you are discharged home, call the office at once.  Special Instructions - You are still able to use cellular telephones; use the ear opposite the side where you have your pacemaker/defibrillator.  Avoid carrying your cellular phone near your device. - When traveling through airports, show security personnel your identification card to avoid being screened in the metal detectors.  Ask the security personnel to use the hand wand. - Avoid arc welding equipment, TENS units (transcutaneous nerve stimulators).  Call the office for questions about other devices. - Avoid electrical appliances that are in poor condition or are not properly grounded. - Microwave ovens are safe to be near or to operate.   Radial Site Care Refer to this sheet in the next few weeks. These instructions provide you with information on caring for yourself after your procedure. Your caregiver may also give you more specific instructions. Your treatment has been planned according to current medical practices, but problems sometimes occur. Call your  caregiver if you have any problems or questions after your procedure. HOME CARE INSTRUCTIONS  You may shower the day after the procedure.Remove the bandage (dressing) and gently wash the site with plain soap and water.Gently pat the site dry.   Do not apply powder or lotion to the site.   Do not submerge the affected site in water for 3 to 5 days.   Inspect the site at least twice daily.   Do not flex or bend the affected arm for 24 hours.   No lifting over 5 pounds (2.3 kg) for 5 days after your procedure.   Do not drive home if you are discharged the same day of the procedure. Have someone else drive you.   What to expect:  Any bruising will usually fade within 1 to 2 weeks.   Blood that collects in the tissue (hematoma) may be painful to the touch. It should usually decrease in size and tenderness within 1 to 2 weeks.  SEEK IMMEDIATE MEDICAL CARE IF:  You have unusual pain at the radial site.   You have redness, warmth, swelling, or pain at the radial site.   You have drainage (other than a small amount of blood on the  dressing).   You have chills.   You have a fever or persistent symptoms for more than 72 hours.   You have a fever and your symptoms suddenly get worse.   Your arm becomes pale, cool, tingly, or numb.   You have heavy bleeding from the site. Hold pressure on the site.      10 Habits of Highly Healthy [place] wants to help you get well and stay well.  Live a longer, healthier life by practicing healthy habits every day.  1.  Visit your primary care provider regularly. 2.  Make time for family and friends.  Healthy relationships are important. 3.  Take medications as directed by your provider. 4.  Maintain a healthy weight and a trim waistline. 5.  Eat healthy meals and snacks, rich in fruits, vegetables, whole grains, and lean proteins. 6.  Get moving every day - aim for 150 minutes of moderate physical activity each week. 7.  Don't  smoke. 8.  Avoid alcohol or drink in moderation. 9.  Manage stress through meditation or mindful relaxation. 10.  Get seven to nine hours of quality sleep each night.  Want more information on healthy habits?  To learn more about these and other healthy habits, visit [email or web address]. _____________

## 2016-11-28 ENCOUNTER — Encounter (HOSPITAL_COMMUNITY): Payer: Self-pay | Admitting: Internal Medicine

## 2016-11-28 ENCOUNTER — Other Ambulatory Visit: Payer: Self-pay | Admitting: *Deleted

## 2016-11-28 ENCOUNTER — Encounter: Payer: Self-pay | Admitting: Thoracic Surgery (Cardiothoracic Vascular Surgery)

## 2016-11-28 DIAGNOSIS — N2889 Other specified disorders of kidney and ureter: Secondary | ICD-10-CM

## 2016-11-28 DIAGNOSIS — R911 Solitary pulmonary nodule: Secondary | ICD-10-CM

## 2016-11-28 DIAGNOSIS — I058 Other rheumatic mitral valve diseases: Secondary | ICD-10-CM

## 2016-11-28 HISTORY — DX: Solitary pulmonary nodule: R91.1

## 2016-11-28 HISTORY — DX: Other specified disorders of kidney and ureter: N28.89

## 2016-11-28 LAB — CULTURE, BLOOD (ROUTINE X 2)
CULTURE: NO GROWTH
Culture: NO GROWTH
SPECIAL REQUESTS: ADEQUATE
Special Requests: ADEQUATE

## 2016-11-29 ENCOUNTER — Other Ambulatory Visit: Payer: Self-pay | Admitting: *Deleted

## 2016-11-29 ENCOUNTER — Ambulatory Visit (INDEPENDENT_AMBULATORY_CARE_PROVIDER_SITE_OTHER): Payer: Medicare Other | Admitting: Thoracic Surgery (Cardiothoracic Vascular Surgery)

## 2016-11-29 ENCOUNTER — Encounter: Payer: Self-pay | Admitting: Thoracic Surgery (Cardiothoracic Vascular Surgery)

## 2016-11-29 VITALS — BP 150/80 | HR 67 | Resp 20 | Ht 67.0 in | Wt 217.0 lb

## 2016-11-29 DIAGNOSIS — I059 Rheumatic mitral valve disease, unspecified: Secondary | ICD-10-CM

## 2016-11-29 DIAGNOSIS — I058 Other rheumatic mitral valve diseases: Secondary | ICD-10-CM

## 2016-11-29 DIAGNOSIS — I350 Nonrheumatic aortic (valve) stenosis: Secondary | ICD-10-CM | POA: Diagnosis not present

## 2016-11-29 NOTE — Patient Instructions (Signed)
   Continue taking all current medications without change through the day before surgery.  Have nothing to eat or drink after midnight the night before surgery.  On the morning of surgery take only amlodipine (Norvasc) with a sip of water.

## 2016-11-29 NOTE — Progress Notes (Signed)
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 I had the opportunity to see him in consultation on 11/24/2016.  Treatment options were discussed at length and the patient has decided to proceed with elective surgical intervention for resection of left atrial mass. Since then diagnostic cardiac catheterization was notable for the absence of any significant flow limiting coronary artery disease.  He returns to the office today for follow-up with both of his sons present to further discuss surgical options.  He reports no new problems or complaints since hospital discharge. He complains of mild soreness in his left shoulder related to pacemaker placement. He has not had any dizzy spells, episodes of altered mental status, or any other signs or symptoms of neurologic problems.   Current Outpatient Prescriptions  Medication Sig Dispense Refill  . amLODipine (NORVASC) 10 MG tablet Take 10 mg by mouth daily.  11  . Apoaequorin (PREVAGEN PO) Take 1 tablet by mouth daily. For Brain and Memory Health - OTC    . aspirin EC 325 MG tablet Take 1 tablet (325 mg total) by mouth daily.    . cholecalciferol (VITAMIN D) 1000 units tablet Take 1,000 Units by mouth daily.    Marland Kitchen ibuprofen (ADVIL,MOTRIN) 200 MG tablet Take 200 mg by mouth every 6 (six) hours as needed for headache (pain).    . simvastatin (ZOCOR) 20 MG tablet Take 20 mg by mouth at bedtime.     . Tetrahydrozoline HCl (VISINE OP) Place 1 drop into both eyes daily as needed (dry eyes).    . valsartan (DIOVAN) 320 MG tablet Take 320 mg by mouth daily.     No current facility-administered medications for  this visit.       Physical Exam:   BP (!) 150/80   Pulse 67   Resp 20   Ht 5\' 7"  (1.702 m)   Wt 217 lb (98.4 kg)   SpO2 96% Comment: RA  BMI 33.99 kg/m   General:  Well-appearing  Chest:   Clear to auscultation  CV:   Regular rate and rhythm with prominent systolic murmur  Incisions:  n/a  Abdomen:  Soft and nontender  Extremities:  Warm and well-perfused, no lower extremity edema  Diagnostic Tests:  Right/Left Heart Cath and Coronary Angiography  Conclusion     LV end diastolic pressure is normal.   Conclusions: 1. Mild to moderate, non-obstructive coronary artery disease (see details below). 2. Normal left and right heart filling pressures. 3. Normal Fick cardiac output/index. 4. Mild to moderate aortic stenosis (mean gradient 16-22 mmHg). 5. Severe systemic hypertension.  Recommendations: 1. Medical therapy to prevent progression of coronary artery disease, including improved blood pressure control. 2. Further management of left atrial/mitral valve mass per primary cardiology and cardiac surgery teams.  Nelva Bush, MD [facility] Pager: [phone]    Indications   Nonrheumatic aortic valve stenosis [I35.0 (ICD-10-CM)]  Procedural Details/Technique   Technical Details Indication: 76 y.o. 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 Aortic stenosis was also incidentally noted  by echo. He has therefore been referred for left and right heart catheterization.  GFR: >60 ml/min  Procedure: The risks, benefits, complications, treatment options, and expected outcomes were discussed with the patient. The patient and/or family concurred with the proposed plan, giving informed consent. The patient was brought to the cath lab after IV hydration was begun and oral premedication was given. The patient was further sedated with Versed and Fentanyl. The right wrist was assessed with a modified Allens  test which was normal. The right wrist and elbow were prepped and draped in a sterile fashion. 1% lidocaine was used for local anesthesia. Ultrasound was used to evaluate the right brachial vein. It was patent. A digital ultrasound image was acquired. A micropuncture needle was used to access the right brachial vein under ultrasound guidance. A 49F slender Glidesheath was inserted using modified Seldinger technique. Right heart catheterization was performed by advancing a 49F balloon-tipped catheter through the right heart chambers into the pulmonary capillary wedge position. Pressure measurements and oxygen saturations were obtained.  Using the modified Seldinger access technique, a 19F slender Glidesheath was placed in the right radial artery. 3 mg Verapamil was given through the sheath. Heparin 5000 units were administered. The aortic valve was easily crossed with a 49F JR4 catheter and 0.035" J-wire; left heart catheterization was performed. The JR4 catheter was exchanged for a 19F dual lumen pigtail catheter. Simultaneous LV and aortic pressures were obtained, though discrepancies in mean and peak-to-peak gradients were noted based on transducer selection. The dual lumen pigtail was then exchanged over the J-wire for the JR4 catheter and pullback across the aortic valve performed.  Selective coronary angiography was performed using 49F EBU4 and JR4 catheters to engage the left and right coronary arteries, respectively.  At the end of the procedure, the radial artery sheath was removed and a TR band applied to achieve patent hemostasis. The right brachial vein sheath was removed and hemostasis achieved with manual compression. There were no immediate complications. The patient was taken to the recovery area in stable condition.  Contrast used: 70 mL Isovue Fluoroscopy time: 12.5 min Radiation dose: 541 mGy   Estimated blood loss <50 mL.  During this procedure no sedation was administered.      Complications   Complications documented before study signed (11/25/2016 7:23 PM EDT)    No complications were associated with this study.  Documented by Nelva Bush, MD - 11/25/2016 7:09 PM EDT    Coronary Findings   Dominance: Right  Left Main  Vessel is large.  Left Anterior Descending  Vessel is large. The vessel exhibits minimal luminal irregularities.  First Diagonal Branch  Vessel is moderate in size.  Second Diagonal Branch  Vessel is moderate in size.  Ramus Intermedius  Vessel is small.  Left Circumflex  Vessel is large. There is mild the vessel.  First Obtuse Marginal Branch  Vessel is moderate in size.  Second Obtuse Marginal Branch  Vessel is small in size.  Third Obtuse Marginal Branch  Vessel is large in size.  3rd Mrg lesion, 40% stenosed. The lesion is focal and eccentric.  Right Coronary Artery  Vessel is large.  Prox RCA to Mid RCA lesion, 50% stenosed. The lesion is irregular. The proximal RCA is ectatic.  Right Posterior Descending Artery  Vessel is moderate in size.  Right Posterior Atrioventricular Branch  Vessel is small in size.  Right Heart   Right Heart Pressures RA (mean): 4 mmHg RV (S/EDP): 35/5 mmHg PA (S/D, mean):  35/10 (19) mmHg PCWP (mean): 9 mmHg  Ao sat: 91% PA sat: 67%  Fick CO: 7.6 L/min Fick CI: 3.6 L/min/m^2  PVR: 1.3 Wood units    Left Heart   Left Ventricle LV end diastolic pressure is normal. LVEDP 10 mmHg.    Mitral Valve The annulus is calcified.    Aortic Valve Mild to moderate aortic stenosis: - average mean gradient with dual lumen catheter is 22 mmHg; AVA 2.0 cm^2 - mean gradient by pullback is 16 mmHg; AVA 2.3 cm^2    Coronary Diagrams   Diagnostic Diagram       Implants     No implant documentation for this case.  PACS Images   Show images for Cardiac catheterization   Link to Procedure Log   Procedure Log    Hemo Data    Most Recent Value  Fick Cardiac Output 7.64 L/min  Fick  Cardiac Output Index 3.64 (L/min)/BSA  Aortic Mean Gradient 11 mmHg  Aortic Peak Gradient 9 mmHg  Aortic Valve Area 2.82  Aortic Value Area Index 1.34 cm2/BSA  RA A Wave 4 mmHg  RA V Wave 3 mmHg  RA Mean 1 mmHg  RV Systolic Pressure 32 mmHg  RV Diastolic Pressure 0 mmHg  RV EDP 3 mmHg  PA Systolic Pressure 33 mmHg  PA Diastolic Pressure 10 mmHg  PA Mean 19 mmHg  PW A Wave 9 mmHg  PW V Wave 7 mmHg  PW Mean 6 mmHg  AO Systolic Pressure 034 mmHg  AO Diastolic Pressure 90 mmHg  AO Mean 742 mmHg  LV Systolic Pressure 595 mmHg  LV Diastolic Pressure 5 mmHg  LV EDP 10 mmHg  Arterial Occlusion Pressure Extended Systolic Pressure 638 mmHg  Arterial Occlusion Pressure Extended Diastolic Pressure 81 mmHg  Arterial Occlusion Pressure Extended Mean Pressure 117 mmHg  Left Ventricular Apex Extended Systolic Pressure 756 mmHg  Left Ventricular Apex Extended Diastolic Pressure 4 mmHg  Left Ventricular Apex Extended EDP Pressure 7 mmHg  QP/QS 1  TPVR Index 5.22 HRUI  TSVR Index 30.79 HRUI  PVR SVR Ratio 0.12  TPVR/TSVR Ratio 0.17     CT ANGIOGRAPHY CHEST, ABDOMEN AND PELVIS  TECHNIQUE: Multidetector CT imaging through the chest, abdomen and pelvis was performed using the standard protocol during bolus administration of intravenous contrast. Multiplanar reconstructed images and MIPs were obtained and reviewed to evaluate the vascular anatomy.  CONTRAST:  100 mL of Isovue 370.  COMPARISON:  No priors.  FINDINGS: CTA CHEST FINDINGS  Cardiovascular: Heart size is mildly enlarged. There is no significant pericardial fluid, thickening or pericardial calcification. There is aortic atherosclerosis, as well as atherosclerosis of the great vessels of the mediastinum and the coronary arteries, including calcified atherosclerotic plaque in the left main, left anterior descending, left circumflex and right coronary arteries. Severe thickening calcifications of the aortic valve.  Calcifications of the mitral annulus. Left-sided pacemaker device in position with lead tips terminating in the right atrial appendage and right ventricular apex.  Mediastinum/Lymph Nodes: No pathologically enlarged mediastinal or hilar lymph nodes. Esophagus is unremarkable in appearance. No axillary lymphadenopathy.  Lungs/Pleura: 14 x 8 mm (mean diameter of 11 mm) left lower lobe pulmonary nodule with some surrounding ground-glass attenuation (axial image 42 of series 6). 9 x 5 mm (mean diameter of 7 mm) subpleural nodule in the periphery of the right upper lobe (axial image 20 of series 6). A few patchy areas of peripheral predominant ground-glass attenuation and subpleural reticulation are noted, scattered randomly throughout  the lungs bilaterally, without a definitive craniocaudal gradient. No acute consolidative airspace disease. No pleural effusions.  Musculoskeletal/Soft Tissues: There are no aggressive appearing lytic or blastic lesions noted in the visualized portions of the skeleton.  CTA ABDOMEN AND PELVIS FINDINGS  Hepatobiliary: No suspicious cystic or solid hepatic lesions. No intra or extrahepatic biliary ductal dilatation. Gallbladder is nearly completely decompressed, but otherwise unremarkable in appearance.  Pancreas: No pancreatic mass. No pancreatic ductal dilatation. No pancreatic or peripancreatic fluid or inflammatory changes.  Spleen: Unremarkable.  Adrenals/Urinary Tract: Multiple renal lesions are noted in the kidneys bilaterally. Although several of these lesions are low-attenuation, compatible with cysts, other lesions are intermediate to high attenuation, potentially enhancing (incompletely evaluated on today's contrast enhanced CT examination). Bilateral adrenal glands are unremarkable in appearance. No hydroureteronephrosis. Urinary bladder is normal in appearance.  Stomach/Bowel: The appearance of the stomach is normal. There is  no pathologic dilatation of small bowel or colon. Normal appendix.  Vascular/Lymphatic: Aortic atherosclerosis, without evidence of aneurysm or dissection in the abdominal or pelvic vasculature. Vascular findings and measurements pertinent to potential TAVR procedure, as detailed below. Celiac axis, superior mesenteric artery and inferior mesenteric artery are all widely patent, without definite hemodynamically significant stenosis. Single renal arteries bilaterally are widely patent. No lymphadenopathy noted in the abdomen or pelvis.  Reproductive: Prostate gland and seminal vesicles are unremarkable in appearance.  Other: No significant volume of ascites.  No pneumoperitoneum.  Musculoskeletal: There are no aggressive appearing lytic or blastic lesions noted in the visualized portions of the skeleton.  VASCULAR MEASUREMENTS PERTINENT TO TAVR:  AORTA:  Minimal Aortic Diameter -  16 x 14 mm  Severity of Aortic Calcification -  moderate  RIGHT PELVIS:  Right Common Iliac Artery -  Minimal Diameter - 11.0 x 13.0 mm  Tortuosity - mild  Calcification - mild  Right External Iliac Artery -  Minimal Diameter - 9.4 x 8.8 mm  Tortuosity - severe  Calcification - none  Right Common Femoral Artery -  Minimal Diameter - 8.9 x 10.0 mm  Tortuosity - mild  Calcification - mild  LEFT PELVIS:  Left Common Iliac Artery -  Minimal Diameter - 13.3 x 12.3 mm  Tortuosity - mild  Calcification - mild  Left External Iliac Artery -  Minimal Diameter - 9.8 x 9.9 mm  Tortuosity - severe  Calcification - minimal  Left Common Femoral Artery -  Minimal Diameter - 9.8 x 10.7 mm  Tortuosity - mild  Calcification - none  Review of the MIP images confirms the above findings.  IMPRESSION: 1. Vascular findings and measurements pertinent to potential TAVR procedure, as detailed above. Patient does appear to have suitable pelvic  arterial access bilaterally. 2. Severe thickening calcification of the aortic valve, compatible with the reported clinical history of severe aortic stenosis. 3. Pulmonary nodules in the lungs bilaterally, the largest of which has a mean diameter of 11 mm in the left lower lobe (axial image 42 of series 6). Non-contrast chest CT at 3-6 months is recommended. If the nodules are stable at time of repeat CT, then future CT at 18-24 months (from today's scan) is considered optional for low-risk patients, but is recommended for high-risk patients. This recommendation follows the consensus statement: Guidelines for Management of Incidental Pulmonary Nodules Detected on CT Images: From the Fleischner Society 2017; Radiology 2017; 284:228-243. 4. Aortic atherosclerosis, in addition to left main and 3 vessel coronary artery disease. Assessment for potential risk factor modification, dietary therapy or pharmacologic  therapy may be warranted, if clinically indicated. 5. **An incidental finding of potential clinical significance has been found. Innumerable renal lesions bilaterally. Unfortunately, many of these are not characterized on today's examination. Any one or more of these could represent solid renal neoplasm(s). If the patient's pacemaker is MRI compatible, further evaluation with nonemergent MRI of the abdomen with and without IV gadolinium would be recommended in the near future to exclude underlying neoplasm. Alternatively, CT of the abdomen with and without IV gadolinium could be performed if pacemaker is not MRI compatible. ** 6. Additional incidental findings, as above.   Electronically Signed   By: Vinnie Langton M.D.   On: 11/28/2016 14:46   Impression:  I have personally reviewed the patient's transthoracic and transesophageal echocardiograms, diagnostic cardiac catheterization and CT angiogram. He has a mobile mass adherent to the posterior mitral annulus that measures  1.6 cm in length and is attached to the mitral annulus by relatively thin stalk. 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   The possibility that this is a vegetation seems relatively unlikely given the absence of any other clinical signs or symptoms to suggest the possibility of endocarditis.  Other less likely explanations might include thrombus or fractured plaque and fibrous debris from the posterior annulus which does have some degree of degenerative fibrosis and calcification. The presence of this mass is concerning given the likelihood that the patient sustained a recent TIA that may have been embolic in source.  Some of the patient's symptoms may have been related to hypotension that developed in the setting of complete heart block, but some of the description given by both the patient and his son remained concerning for possible unilateral neurologic symptoms. Under the circumstances I agree that it would be very appropriate to consider surgical intervention for resection of the patient's left atrial mass to eliminate any further risk of embolization. The patient also has moderate aortic stenosis. The aortic valve is trileaflet and there is some fibrosis and restricted leaflet mobility involving all 3 leaflets with particularly significant calcification and restriction of the noncoronary leaflet. Peak velocity across the aortic valve measured close to 4 m/s, consistent with moderate aortic stenosis which was confirmed at catheterization. Catheterization was notable for the absence of significant flow limiting coronary artery disease. Under the circumstances options include proceeding with surgery for resection of left atrial mass, possible mitral valve repair, and aortic valve replacement versus continued medical therapy without intervention.  The patient's case and diagnostic tests have been reviewed with a multidisciplinary team at  specialists. We would not recommend resection of the patient's left atrial mass without concomitant aortic valve replacement at this time because the patient would be at fairly high risk of significant progression of aortic valve disease in the very near future.   In this context the risks of surgery need to be balanced against the risk of stroke if surgery is not performed.     Plan:  I have again reviewed the indications, risks, and potential benefits of surgery with the patient and his 2 sons in the office today.  Treatment alternatives have been discussed in detail.  The patient and both his sons acknowledge that the patient has experienced some degree of significant functional decline over the past year or more with cognitive problems consistent with mild dementia. There is no question that proceeding with open cardiac surgery in this setting would come with somewhat increased risk of stroke and/or exacerbation of  the patient's ongoing cognitive dysfunction with the possibility of significant functional decline.   They understand and accept all potential associated risks of surgery including but not limited to risk of death, stroke, myocardial infarction, congestive heart failure, respiratory failure, renal failure, pneumonia, bleeding requiring blood transfusion and or reexploration, arrhythmia, damage to leads of the pre-existing permanent pacemaker, aortic dissection or other major vascular complication, pleural effusions or other delayed complications related to continued congestive heart failure, and other late complications related to valve replacement including structural valve deterioration and failure, thrombosis, endocarditis, or paravalvular leak.    I spent in excess of 45 minutes during the conduct of this office consultation and >50% of this time involved direct face-to-face encounter with the patient for counseling and/or coordination of their care.    Valentina Gu. Roxy Manns,  MD 11/29/2016 12:47 PM

## 2016-11-30 ENCOUNTER — Encounter (HOSPITAL_COMMUNITY): Payer: Self-pay | Admitting: Thoracic Surgery (Cardiothoracic Vascular Surgery)

## 2016-11-30 DIAGNOSIS — N183 Chronic kidney disease, stage 3 unspecified: Secondary | ICD-10-CM | POA: Diagnosis present

## 2016-11-30 DIAGNOSIS — E119 Type 2 diabetes mellitus without complications: Secondary | ICD-10-CM

## 2016-11-30 NOTE — H&P (Signed)
[address]       [place] [address]             [phone]          CARDIOTHORACIC SURGERY HISTORY AND PHYSICAL EXAM  PCP is Kandice Hams, MD Referring Provider is CAMNITZ, WILL MARTIN, MD   Reason for consultation:  Left atrial mass s/p TIA  HPI:  Patient is a 76 year old African-American male with known history of heart murmur, long-standing hypertension, hyperlipidemia, tobacco abuse, diet-controlled type 2 diabetes mellitus, and mild dementia who has been referred for surgical consultation to discuss treatment options for management of recently discovered small, mobile-appearing mass in the left atrium within the context of possible recent TIA.  The patient has long-standing history of hypertension and a heart murmur for which he was evaluated many years ago in [place]. After his wife passed away several years ago the patient moved to [place] to live close to his son. According to the patient's son he has developed relatively mild problems with short-term memory loss and poor insight over the past year or so.  He has been fully evaluated by a neurologist (Dr. Leta Baptist) on 2 previous occasions, and diagnosed with mild dementia. Despite this, he has remained functionally independent and reasonably active.  He was last seen by his neurologist in February, and more recently he was seen by his primary care provider on 11/16/2016 for routine annual follow-up. At that time he was noted to be hypotensive and bradycardic. Coreg was discontinued. On the morning of 11/18/2016 the patient's son telephoned him and noted that the patient sounded confused, somewhat agitated, and had slurred speech. His son came to check on and promptly and noted what he thought might be right sided facial droop and slurred speech. These symptoms all seemed to resolve within 10-15 minutes. He brought the patient directly to the emergency department for evaluation.  EKG at the time  revealed complete heart block with heart rate of 40. He was not hypotensive. He was admitted to the hospital and evaluated by neurology and cardiology. MRI of the brain revealed no acute intracranial abnormality and stable mild chronic microvascular ischemic changes with some mild parenchymal volume loss and stable small chronic lacunar infarct in the left hemi-pons.  The patient's transient neurologic symptoms were attributed to a likely TIA.  Transthoracic echocardiogram was performed demonstrating vigorous left ventricular systolic function with at least mild concentric left ventricular hypertrophy. There was moderate aortic stenosis with peak velocity across the aortic valve measuring close to 4 m/s corresponding to mean transvalvular gradient estimated 23 mmHg.  The DVI was reported 0.42.  There was severe calcification in the mitral annulus with thickened moderately calcified leaflets. There was felt to be moderate mitral stenosis and mild mitral regurgitation.  In addition there was a mobile echodensity attached to the posterior mitral annulus within the left atrium measuring greater than 1 cm in its greatest dimension, suspicious for possible vegetation.  TEE was recommended. The patient underwent implantation of a dual-chamber pacemaker on 11/21/2016 and TEE on 11/23/2016.  TEE confirmed the presence of some degree of aortic stenosis with moderate thickening with restricted leaflet mobility and significant calcification and restriction of the noncoronary cusp of the aortic valve. There was mild calcification of the mitral annulus with only mildly thickened leaflets and no significant mitral stenosis. There was mild mitral regurgitation. 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 Blood cultures were obtained and cardiothoracic surgical consultation was  requested.  The patient is widowed and lives alone in [place]. His son lives close by, remains very supportive, is present for his consultation this evening, and is the patient's medical power of attorney. The patient still drives an automobile and remains functionally independent. He has developed some problems with short-term memory loss and intermittent confusion that has been mild but somewhat progressive over the past year or more. The patient's son notes that the patient's confusion seems to be considerably worse since he has been in the hospital. However, the patient is oriented 3 and answers most questions appropriately. The patient denies any symptoms of exertional shortness of breath or chest discomfort. He has had some occasional palpitations recently without dizzy spells or syncope. The patient and his son both deny any history of focal neurologic symptoms other than that which occurred at the time of his admission to the hospital on 11/18/2016. Appetite is good. He has not been having any fevers or chills. He denies any significant arthritis or arthralgias.  Patient returns to the office for follow-up of left atrial mass and aortic stenosis discovered during his recent hospitalization for complete heart block with transient ischemic attack. I had the opportunity to see him in consultation on 11/24/2016.  Treatment options were discussed at length and the patient has decided to proceed with elective surgical intervention for resection of left atrial mass. Since then diagnostic cardiac catheterization was notable for the absence of any significant flow limiting coronary artery disease.  He returns to the office today for follow-up with both of his sons present to further discuss surgical options.  He reports no new problems or complaints since hospital discharge. He complains of mild soreness in his left shoulder related to pacemaker placement. He has not had any dizzy spells, episodes of altered  mental status, or any other signs or symptoms of neurologic problems.   Past Medical History:  Diagnosis Date  . Aortic stenosis    a. 11/20/2016 Echo: mod AS;  b. 11/23/2016 TEE restricted mobility of noncoronary cusp;  c. 11/2016 Cath: mean grad 70mmHg w/ dual lumen catheter, AVA 2.0cm^2; mean gradient by pullback = 41mmHg, AVA 2.3cm^2.  . Carotid arterial disease ([place])    a. 11/2016 Carotid U/S: bilat 1-39% ICA stenosis.  . Complete heart block ([place])    a. 11/21/2016 s/p MDT Santina Evans DR MRI SureScan (ser # [id number]).  . Dementia    mild  . Diabetes ([place])    diet controlled  . Heart murmur   . Hypercholesteremia   . Hypertension    benign  . Incidental pulmonary nodule, greater than or equal to 49mm - needs f/u scan 3 months 11/28/2016   14 x 8 mm (mean diameter of 11 mm) left lower lobe pulmonary nodule with some surrounding ground-glass attenuation (axial image 42 of series 6). 9 x 5 mm (mean diameter of 7 mm) subpleural nodule in the periphery of the right upper lobe (axial image 20 of series 6). A few patchy areas of peripheral predominant ground-glass attenuation and subpleural reticulation are noted, scattered randomly throu  . 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 11/2009 TEE: EF 60-65%, restricted mobility of noncoronary AoV cusp, 16x4 mm mobile Ca2+ echodensity on the atrial basal surface of the posterior MV leaflet, mild MR, no LA/LAA thrombus.  . Non-obstructive CAD (coronary artery disease)  a. 11/2016 Cath: LM large, nl, LAD min irregs, LCX large, min irregs, OM3 40, RCA large, 22m,   . Prostate cancer [facility])    s/p radiation  . Renal mass of unknown nature - needs f/u MRI 11/28/2016   Multiple renal lesions are noted in the kidneys bilaterally. Although several of these lesions are low-attenuation, compatible with cysts, other lesions are intermediate to high attenuation, potentially enhancing  . TIA (transient ischemic attack)    a. 11/2016 in setting of complete heart  block.    Past Surgical History:  Procedure Laterality Date  . PACEMAKER IMPLANT N/A 11/21/2016   Procedure: Pacemaker Implant;  Surgeon: Will [name], MD;  Location: [facility];  Service: Cardiovascular;  Laterality: N/A;  . RIGHT/LEFT HEART CATH AND CORONARY ANGIOGRAPHY N/A 11/25/2016   Procedure: Right/Left Heart Cath and Coronary Angiography;  Surgeon: Nelva Bush, MD;  Location: [facility];  Service: Cardiovascular;  Laterality: N/A;  . TEE WITHOUT CARDIOVERSION N/A 11/23/2016   Procedure: TRANSESOPHAGEAL ECHOCARDIOGRAM (TEE);  Surgeon: Jerline Pain, MD;  Location: [facility] ENDOSCOPY;  Service: Cardiovascular;  Laterality: N/A;    No family history on file.  Social History Social History  Substance Use Topics  . Smoking status: Former Smoker    Packs/day: 0.25    Types: Cigarettes    Quit date: 11/16/2016  . Smokeless tobacco: Never Used  . Alcohol use No    Prior to Admission medications   Medication Sig Start Date End Date Taking? Authorizing Provider  amLODipine (NORVASC) 10 MG tablet Take 10 mg by mouth daily. 10/25/16   Historical Provider, MD  Apoaequorin (PREVAGEN PO) Take 1 tablet by mouth daily. For [facility] Provider, MD  aspirin EC 325 MG tablet Take 1 tablet (325 mg total) by mouth daily. 11/26/16   Rogelia Mire, NP  cholecalciferol (VITAMIN D) 1000 units tablet Take 1,000 Units by mouth daily.    Historical Provider, MD  ibuprofen (ADVIL,MOTRIN) 200 MG tablet Take 200 mg by mouth every 6 (six) hours as needed for headache (pain).    Historical Provider, MD  simvastatin (ZOCOR) 20 MG tablet Take 20 mg by mouth at bedtime.     Historical Provider, MD  Tetrahydrozoline HCl (VISINE OP) Place 1 drop into both eyes daily as needed (dry eyes).    Historical Provider, MD  valsartan (DIOVAN) 320 MG tablet Take 320 mg by mouth daily.    Historical Provider, MD    No Known Allergies   Review of Systems:               General:                      normal appetite, normal energy, no weight gain, no weight loss, no fever             Cardiac:                       no chest pain with exertion, no chest pain at rest, no SOB with exertion, no resting SOB, no PND, no orthopnea, + palpitations, + arrhythmia, no atrial fibrillation, no LE edema, no dizzy spells, no syncope             Respiratory:                 no shortness of breath, no home oxygen, no productive cough, occasional dry  cough, no bronchitis, no wheezing, no hemoptysis, no asthma, no pain with inspiration or cough, no sleep apnea, no CPAP at night             GI:                               no difficulty swallowing, no reflux, no frequent heartburn, no hiatal hernia, no abdominal pain, no constipation, no diarrhea, no hematochezia, no hematemesis, no melena             GU:                              no dysuria,  no frequency, no urinary tract infection, no hematuria, no enlarged prostate, no kidney stones, no kidney disease             Vascular:                     no pain suggestive of claudication, no pain in feet, no leg cramps, no varicose veins, no DVT, no non-healing foot ulcer             Neuro:                         no stroke, + TIA's, no seizures, no headaches, no temporary blindness one eye,  + slurred speech, no peripheral neuropathy, no chronic pain, no instability of gait, + memory/cognitive dysfunction             Musculoskeletal:         mild arthritis - primarily involving the hands, no joint swelling, no myalgias, no difficulty walking, normal mobility              Skin:                            no rash, no itching, no skin infections, no pressure sores or ulcerations             Psych:                         no anxiety, no depression, no nervousness, no unusual recent stress             Eyes:                           + blurry vision, no floaters, + recent vision changes, does not wear glasses or contacts             ENT:                             no hearing loss, no loose or painful teeth, no dentures, last saw dentist approximately 1 year ago             Hematologic:               no easy bruising, no abnormal bleeding, no clotting disorder, no frequent epistaxis             Endocrine:                   + diabetes, does not check CBG's at home  Physical Exam:              BP (!) 145/89 (BP Location: Left Arm)   Pulse 65   Temp 97.6 F (36.4 C) (Oral)   Resp 18   Ht 5\' 7"  (1.702 m)   Wt 217 lb 11.2 oz (98.7 kg)   SpO2 99%   BMI 34.10 kg/m              General:                      Moderately obese,  well-appearing             HEENT:                       Unremarkable              Neck:                           no JVD, no bruits, no adenopathy              Chest:                          clear to auscultation, symmetrical breath sounds, no wheezes, no rhonchi              CV:                              RRR, grade II/VI systolic murmur              Abdomen:                    soft, non-tender, no masses              Extremities:                 warm, well-perfused, pulses diminished but palpable, no lower extremity edema             Rectal/GU                   Deferred             Neuro:                         Grossly non-focal and symmetrical throughout             Skin:                            Clean and dry, no rashes, no breakdown  Diagnostic Tests:  MRI HEAD WITHOUT CONTRAST  MRA HEAD WITHOUT CONTRAST  TECHNIQUE: Multiplanar, multiecho pulse sequences of the brain and surrounding structures were obtained without intravenous contrast. Angiographic images of the head were obtained using MRA technique without contrast.  COMPARISON: 08/11/2016 MRI of the brain.  FINDINGS: MRI HEAD FINDINGS  Moderate motion artifact degrades several sequences.  Brain: No acute infarction, hemorrhage, hydrocephalus, extra-axial collection or mass lesion. Stable mild  chronic microvascular ischemic changes and mild parenchymal volume loss of the brain. Stable small chronic lacunar infarct within left hemi pons.  Vascular: As below.  Skull and upper cervical spine: Normal marrow signal.  Sinuses/Orbits: Negative.  Other: None.  MRA HEAD FINDINGS  Internal carotid arteries: Patent. Lumen irregularity of bilateral cavernous and paraclinoid segments without high-grade stenosis is likely related to atherosclerotic calcification.  Anterior cerebral arteries: Patent.  Middle cerebral arteries: Patent.  Anterior communicating artery: Patent.  Posterior communicating arteries: Probable diminutive left. No right identified, likely hypoplastic or absent.  Posterior cerebral arteries: Patent.  Basilar artery: Patent.  Vertebral arteries: Patent.  No high-grade stenosis or aneurysm identified. Mild motion artifact, suboptimal assessment for subtle stenosis or small 2-3 mm aneurysm.  IMPRESSION: 1. No acute intracranial abnormality identified. 2. Stable mild chronic microvascular ischemic changes and mild parenchymal volume loss of the brain. Stable small chronic lacunar infarct in left hemi pons. 3. Patent circle of Willis. No high-grade stenosis, large vessel occlusion, or aneurysm identified.   Electronically Signed By: Kristine Garbe M.D. On: 11/19/2016 00:33   Transthoracic Echocardiography  Patient: Harshan, Kearley MR #: [id number] Study Date: 11/20/2016 Gender: M Age: 57 Height: 170.2 cm Weight: 98.7 kg BSA: 2.2 m^2 Pt. Status: Room: 4NP05C  ORDERING Cristopher Peru, MD REFERRING Cristopher Peru, MD ADMITTING Virl Axe ATTENDING Gareth Morgan PERFORMING [facility], Inpatient REFERRING Leanor Kail SONOGRAPHER Mikki Santee  cc:  ------------------------------------------------------------------- LV EF:  65% - 70%  ------------------------------------------------------------------- History: PMH: Complete heart block. Patient needs pacemaker tomorrow. Murmur. Risk factors: Hypertension. Diabetes mellitus.  ------------------------------------------------------------------- Study Conclusions  - Left ventricle: The cavity size was normal. There was mild concentric hypertrophy. Systolic function was vigorous. The estimated ejection fraction was in the range of 65% to 70%. Wall motion was normal; there were no regional wall motion abnormalities. Doppler parameters are consistent with abnormal left ventricular relaxation (grade 1 diastolic dysfunction). - Aortic valve: Valve mobility was restricted. There was moderate stenosis. Mean gradient (S): 26 mm Hg. Peak gradient (S): 62 mm Hg. Valve area (VTI): 1.75 cm^2. Valve area (Vmax): 1.31 cm^2. Valve area (Vmean): 1.54 cm^2. - Aortic root: The aortic root was normal in size. - Mitral valve: Severely calcified annulus. Severely thickened, moderately calcified leaflets . Leaflet separation was mildly reduced. The findings are consistent with moderate stenosis. There was mild regurgitation. Valve area by pressure half-time: 1.16 cm^2. Valve area by continuity equation (using LVOT flow): 1.65 cm^2. - Left atrium: The atrium was mildly dilated. - Right ventricle: Systolic function was normal. - Right atrium: The atrium was normal in size. - Tricuspid valve: There was mild regurgitation. - Pulmonary arteries: Systolic pressure was within the normal range. - Inferior vena cava: The vessel was normal in size. - Pericardium, extracardiac: There was no pericardial effusion.  Impressions:  - Hyperdynamic LVEF. Moderate aortic stenosis. Moderate mitral stenosis. The mitral valve is severely thickened and calcified with restricted leaflet opening. There are severe mitral annular calcifications  predominantly posterior. There is a mobile echodensity attached to the posterior mitral valve leaflet/annulus from the left atrial side measuring 12 x 7 mm. This might represent a vegetation vs detached calcifications. A TEE is recommended for further evaluation.  ------------------------------------------------------------------- Study data: No prior study was available for comparison. Study status: Routine. Procedure: The patient reported no pain pre or post test. Transthoracic echocardiography. Image quality was adequate. Study completion: There were no complications. Transthoracic echocardiography. M-mode, complete 2D, spectral Doppler, and color Doppler. Birthdate: Patient birthdate: 1941-01-25. Age: Patient is 76 yr old. Sex: Gender: male. BMI: 34.1 kg/m^2. Blood pressure: 148/115 Patient status: Inpatient. Study date: Study date: 11/20/2016. Study time: 03:35 PM. Location: Echo laboratory.  -------------------------------------------------------------------  ------------------------------------------------------------------- Left ventricle: The cavity size was normal. There was mild concentric hypertrophy. Systolic function  was vigorous. The estimated ejection fraction was in the range of 65% to 70%. Wall motion was normal; there were no regional wall motion abnormalities. Doppler parameters are consistent with abnormal left ventricular relaxation (grade 1 diastolic dysfunction).  ------------------------------------------------------------------- Aortic valve: Trileaflet; severely thickened, severely calcified leaflets. Valve mobility was restricted. Doppler: There was moderate stenosis. There was no regurgitation. VTI ratio of LVOT to aortic valve: 0.56. Valve area (VTI): 1.75 cm^2. Indexed valve area (VTI): 0.8 cm^2/m^2. Peak velocity ratio of LVOT to aortic valve: 0.42. Valve area (Vmax): 1.31 cm^2. Indexed valve area  (Vmax): 0.6 cm^2/m^2. Mean velocity ratio of LVOT to aortic valve: 0.49. Valve area (Vmean): 1.54 cm^2. Indexed valve area (Vmean): 0.7 cm^2/m^2. Mean gradient (S): 26 mm Hg. Peak gradient (S): 62 mm Hg.  ------------------------------------------------------------------- Aorta: Aortic root: The aortic root was normal in size.  ------------------------------------------------------------------- Mitral valve: Severely calcified annulus. Severely thickened, moderately calcified leaflets . Leaflet separation was mildly reduced. Mobility was not restricted. Doppler: The findings are consistent with moderate stenosis. There was mild regurgitation. Valve area by pressure half-time: 1.16 cm^2. Indexed valve area by pressure half-time: 0.53 cm^2/m^2. Valve area by continuity equation (using LVOT flow): 1.65 cm^2. Indexed valve area by continuity equation (using LVOT flow): 0.75 cm^2/m^2. Mean gradient (D): 5 mm Hg. Peak gradient (D): 5 mm Hg.  ------------------------------------------------------------------- Left atrium: The atrium was mildly dilated.  ------------------------------------------------------------------- Right ventricle: The cavity size was normal. Wall thickness was normal. Systolic function was normal.  ------------------------------------------------------------------- Pulmonic valve: Structurally normal valve. Cusp separation was normal. Doppler: Transvalvular velocity was within the normal range. There was no evidence for stenosis. There was no regurgitation.  ------------------------------------------------------------------- Tricuspid valve: Structurally normal valve. Doppler: Transvalvular velocity was within the normal range. There was mild regurgitation.  ------------------------------------------------------------------- Pulmonary artery: The main pulmonary artery was normal-sized. Systolic pressure was within the  normal range.  ------------------------------------------------------------------- Right atrium: The atrium was normal in size.  ------------------------------------------------------------------- Pericardium: There was no pericardial effusion.  ------------------------------------------------------------------- Systemic veins: Inferior vena cava: The vessel was normal in size.  ------------------------------------------------------------------- Measurements  Left ventricle Value Reference LV ID, ED, PLAX chordal 45 mm 43 - 52 LV ID, ES, PLAX chordal 33 mm 23 - 38 LV fx shortening, PLAX chordal (L) 27 % >=29 LV PW thickness, ED 13 mm ---------- IVS/LV PW ratio, ED 0.92 <=1.3 Stroke volume, 2D 136 ml ---------- Stroke volume/bsa, 2D 62 ml/m^2 ----------  Ventricular septum Value Reference IVS thickness, ED 12 mm ----------  LVOT Value Reference LVOT ID, S 20 mm ---------- LVOT area 3.14 cm^2 ---------- LVOT peak velocity, S 165 cm/s ---------- LVOT mean velocity, S 105 cm/s ---------- LVOT VTI, S 43.4 cm ---------- LVOT peak gradient, S 11 mm Hg ----------  Aortic valve Value Reference Aortic valve peak velocity, S 394 cm/s ---------- Aortic valve mean velocity, S 214 cm/s ---------- Aortic valve VTI, S 77.9 cm ---------- Aortic mean gradient, S  23 mm Hg ---------- Aortic peak gradient, S 62 mm Hg ---------- VTI ratio, LVOT/AV 0.56 ---------- Aortic valve area, VTI 1.75 cm^2 ---------- Aortic valve area/bsa, VTI 0.8 cm^2/m^2 ---------- Velocity ratio, peak, LVOT/AV 0.42 ---------- Aortic valve area, peak velocity 1.31 cm^2 ---------- Aortic valve area/bsa, peak 0.6 cm^2/m^2 ---------- velocity Velocity ratio, mean, LVOT/AV 0.49 ---------- Aortic valve area, mean velocity 1.54 cm^2 ---------- Aortic valve area/bsa, mean 0.7 cm^2/m^2 ---------- velocity  Aorta Value Reference Aortic root ID, ED 33 mm ----------  Left atrium Value Reference LA ID, A-P, ES 43 mm ---------- LA ID/bsa, A-P  1.96 cm/m^2 <=2.2 LA volume, S 85 ml ---------- LA volume/bsa, S 38.7 ml/m^2 ---------- LA volume, ES, 1-p A4C 53.5 ml ---------- LA volume/bsa, ES, 1-p A4C 24.3 ml/m^2 ---------- LA volume, ES, 1-p A2C 117 ml ---------- LA volume/bsa, ES, 1-p A2C 53.2 ml/m^2 ----------  Mitral valve Value Reference Mitral E-wave peak velocity 112 cm/s ---------- Mitral A-wave peak velocity 160 cm/s ---------- Mitral mean velocity, D 97.4 cm/s ---------- Mitral deceleration time (H) 512 ms 150 - 230 Mitral pressure half-time 189 ms ---------- Mitral mean  gradient, D 5 mm Hg ---------- Mitral peak gradient, D 5 mm Hg ---------- Mitral E/A ratio, peak 0.7 ---------- Mitral valve area, PHT, DP 1.16 cm^2 ---------- Mitral valve area/bsa, PHT, DP 0.53 cm^2/m^2 ---------- Mitral valve area, LVOT 1.65 cm^2 ---------- continuity Mitral valve area/bsa, LVOT 0.75 cm^2/m^2 ---------- continuity Mitral annulus VTI, D 82.5 cm ----------  Right atrium Value Reference RA ID, S-I, ES, A4C (H) 68.7 mm 34 - 49 RA area, ES, A4C (H) 21.6 cm^2 8.3 - 19.5 RA volume, ES, A/L 55.3 ml ---------- RA volume/bsa, ES, A/L 25.2 ml/m^2 ----------  Legend: (L) and (H) mark values outside specified reference range.  ------------------------------------------------------------------- Prepared and Electronically Authenticated by  Ena Dawley, M.D. 2018-04-08T17:29:34   Transesophageal Echocardiography  Patient: Ahmad, Vanwey MR #: [id number] Study Date: 11/23/2016 Gender: M Age: 62 Height: 170.2 cm Weight: 98.6 kg BSA: 2.2 m^2 Pt. Status: Room: 2W34C  ATTENDING Jolyn Nap, M.D. SONOGRAPHER [place], [place] PERFORMING Candee Furbish, M.D. ADMITTING Ricky Stabs, [place] REFERRING [place], [place] SONOGRAPHER Mikki Santee  cc:  ------------------------------------------------------------------- LV EF: 60% - 65%  ------------------------------------------------------------------- Indications: TIA 435.9.  ------------------------------------------------------------------- History: PMH: Murmur. Risk factors: Former tobacco  use. Hypertension. Diabetes mellitus.  ------------------------------------------------------------------- Study Conclusions  - Left ventricle: The cavity size was normal. There was severe concentric hypertrophy. Systolic function was normal. The estimated ejection fraction was in the range of 60% to 65%. - Aortic valve: Cusp separation was reduced. Noncoronary cusp mobility was restricted. - Mitral valve: Mildly calcified annulus. Mildly thickened leaflets . There was a 16 x 4 mm mobile calcified echodensity on the atrial basal surface of the posterior mitral valve. Differential includes fibroelastoma or vegetation (checking blood cultures). Chronic vegetations are usually calcified as this mass currently demonstrates, however they are not as mobile as we are seeing. There was mild regurgitation. - Left atrium: No evidence of thrombus in the atrial cavity or appendage. No evidence of thrombus in the appendage. - Right atrium: Pacer wire or catheter noted in right atrium. No evidence of thrombus in the atrial cavity or appendage. - Tricuspid valve: No evidence of vegetation. - Pulmonic valve: No evidence of vegetation.  Recommendations: This procedure has been discussed with the referring physician.  ------------------------------------------------------------------- Labs, prior tests, procedures, and surgery: Permanent pacemaker system implantation.  ------------------------------------------------------------------- Study data: Study status: Routine. Consent: The risks, benefits, and alternatives to the procedure were explained to the patient and informed consent was obtained. Procedure: The patient reported no pain pre or post test. Initial setup. The patient was brought to the laboratory. Surface ECG leads were monitored. Sedation. Conscious sedation was administered by cardiology staff. Transesophageal echocardiography. Topical anesthesia  was obtained using viscous lidocaine. A transesophageal probe was inserted by the attending cardiologist. Image quality was adequate. Study completion: The patient tolerated the procedure well. There were no complications. Administered medications: Fentanyl, 65mcg. Midazolam, 4mg . Diagnostic transesophageal echocardiography. 2D and color Doppler. Birthdate: Patient birthdate: 06-Oct-1940. Age: Patient is 76 yr old. Sex:  Gender: male. BMI: 34.1 kg/m^2. Blood pressure: 161/99 Patient status: Inpatient. Study date: Study date: 11/23/2016. Study time: 12:09 PM. Location: Endoscopy.  -------------------------------------------------------------------  ------------------------------------------------------------------- Left ventricle: The cavity size was normal. There was severe concentric hypertrophy. Systolic function was normal. The estimated ejection fraction was in the range of 60% to 65%.  ------------------------------------------------------------------- Aortic valve: Severely thickened, severely calcified leaflets. Cusp separation was reduced. Noncoronary cusp mobility was restricted. Doppler: Transvalvular velocity was minimally increased. There was no stenosis.  ------------------------------------------------------------------- Aorta: The aorta was normal, not dilated, and non-diseased.  ------------------------------------------------------------------- Mitral valve: Mildly calcified annulus. Mildly thickened leaflets . Leaflet separation was normal. There was a 16 x 4 mm mobile calcified echodensity on the atrial basal surface of the posterior mitral valve. Differential includes fibroelastoma or vegetation (checking blood cultures). Chronic vegetations are usually calcified as this mass currently demonstrates, however they are not as mobile as we are seeing. Doppler: There was  mild regurgitation.  ------------------------------------------------------------------- Left atrium: The atrium was normal in size. No evidence of thrombus in the atrial cavity or appendage. No evidence of thrombus in the appendage. The appendage was of normal size. Emptying velocity was normal.  ------------------------------------------------------------------- Right ventricle: The cavity size was normal. Wall thickness was normal. Pacer wire or catheter noted in right ventricle. Systolic function was normal.  ------------------------------------------------------------------- Pulmonic valve: Poorly visualized. Structurally normal valve. Cusp separation was normal. No evidence of vegetation.  ------------------------------------------------------------------- Tricuspid valve: Structurally normal valve. Leaflet separation was normal. No evidence of vegetation. Doppler: There was no regurgitation.  ------------------------------------------------------------------- Right atrium: The atrium was normal in size. There is nodular appearance to RA/RV free wall. Pacer wire or catheter noted in right atrium. No evidence of thrombus in the atrial cavity or appendage.  ------------------------------------------------------------------- Pericardium: The pericardium was normal in appearance. There was no pericardial effusion.  ------------------------------------------------------------------- Post procedure conclusions Ascending Aorta:  - The aorta was normal, not dilated, and non-diseased.  ------------------------------------------------------------------- Prepared and Electronically Authenticated by  Candee Furbish, M.D. 2018-04-11T13:47:55    Right/Left Heart Cath and Coronary Angiography  Conclusion     LV end diastolic pressure is normal.  Conclusions: 1. Mild to moderate, non-obstructive coronary artery disease (see details  below). 2. Normal left and right heart filling pressures. 3. Normal Fick cardiac output/index. 4. Mild to moderate aortic stenosis (mean gradient 16-22 mmHg). 5. Severe systemic hypertension.  Recommendations: 1. Medical therapy to prevent progression of coronary artery disease, including improved blood pressure control. 2. Further management of left atrial/mitral valve mass per primary cardiology and cardiac surgery teams.  Nelva Bush, MD [facility] Pager: [phone]    Indications   Nonrheumatic aortic valve stenosis [I35.0 (ICD-10-CM)]  Procedural Details/Technique   Technical Details Indication: 76 y.o. 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 Aortic stenosis was also incidentally noted by echo. He has therefore been referred for left and right heart catheterization.  GFR: >60 ml/min  Procedure: The risks, benefits, complications, treatment options, and expected outcomes were discussed with the patient. The patient and/or family concurred with the proposed plan, giving informed consent. The patient was brought to the cath lab after IV hydration was begun and oral premedication was given. The patient was further sedated with Versed and Fentanyl. The right wrist was assessed with a modified Allens test which was normal. The right wrist and elbow were prepped and draped in a sterile fashion. 1% lidocaine was used for local anesthesia. Ultrasound was used to evaluate the right brachial  vein. It was patent. A digital ultrasound image was acquired. A micropuncture needle was used to access the right brachial vein under ultrasound guidance. A 559F slender Glidesheath was inserted using modified Seldinger technique. Right heart catheterization was performed by advancing a 559F balloon-tipped catheter through the right heart chambers into the pulmonary capillary wedge position. Pressure measurements and oxygen saturations  were obtained.  Using the modified Seldinger access technique, a 34F slender Glidesheath was placed in the right radial artery. 3 mg Verapamil was given through the sheath. Heparin 5000 units were administered. The aortic valve was easily crossed with a 559F JR4 catheter and 0.035" J-wire; left heart catheterization was performed. The JR4 catheter was exchanged for a 34F dual lumen pigtail catheter. Simultaneous LV and aortic pressures were obtained, though discrepancies in mean and peak-to-peak gradients were noted based on transducer selection. The dual lumen pigtail was then exchanged over the J-wire for the JR4 catheter and pullback across the aortic valve performed.  Selective coronary angiography was performed using 559F EBU4 and JR4 catheters to engage the left and right coronary arteries, respectively.  At the end of the procedure, the radial artery sheath was removed and a TR band applied to achieve patent hemostasis. The right brachial vein sheath was removed and hemostasis achieved with manual compression. There were no immediate complications. The patient was taken to the recovery area in stable condition.  Contrast used: 70 mL Isovue Fluoroscopy time: 12.5 min Radiation dose: 541 mGy   Estimated blood loss <50 mL.  During this procedure no sedation was administered.    Complications   Complications documented before study signed (11/25/2016 7:23 PM EDT)    No complications were associated with this study.  Documented by Nelva Bush, MD - 11/25/2016 7:09 PM EDT    Coronary Findings   Dominance: Right  Left Main  Vessel is large.  Left Anterior Descending  Vessel is large. The vessel exhibits minimal luminal irregularities.  First Diagonal Branch  Vessel is moderate in size.  Second Diagonal Branch  Vessel is moderate in size.  Ramus Intermedius  Vessel is small.  Left Circumflex  Vessel is large. There is mild the vessel.  First Obtuse Marginal Branch  Vessel is  moderate in size.  Second Obtuse Marginal Branch  Vessel is small in size.  Third Obtuse Marginal Branch  Vessel is large in size.  3rd Mrg lesion, 40% stenosed. The lesion is focal and eccentric.  Right Coronary Artery  Vessel is large.  Prox RCA to Mid RCA lesion, 50% stenosed. The lesion is irregular. The proximal RCA is ectatic.  Right Posterior Descending Artery  Vessel is moderate in size.  Right Posterior Atrioventricular Branch  Vessel is small in size.  Right Heart   Right Heart Pressures RA (mean): 4 mmHg RV (S/EDP): 35/5 mmHg PA (S/D, mean): 35/10 (19) mmHg PCWP (mean): 9 mmHg  Ao sat: 91% PA sat: 67%  Fick CO: 7.6 L/min Fick CI: 3.6 L/min/m^2  PVR: 1.3 Wood units    Left Heart   Left Ventricle LV end diastolic pressure is normal. LVEDP 10 mmHg.    Mitral Valve The annulus is calcified.    Aortic Valve Mild to moderate aortic stenosis: - average mean gradient with dual lumen catheter is 22 mmHg; AVA 2.0 cm^2 - mean gradient by pullback is 16 mmHg; AVA 2.3 cm^2    Coronary Diagrams   Diagnostic Diagram       Implants        No  implant documentation for this case.  PACS Images   Show images for Cardiac catheterization   Link to Procedure Log   Procedure Log    Hemo Data    Most Recent Value  Fick Cardiac Output 7.64 L/min  Fick Cardiac Output Index 3.64 (L/min)/BSA  Aortic Mean Gradient 11 mmHg  Aortic Peak Gradient 9 mmHg  Aortic Valve Area 2.82  Aortic Value Area Index 1.34 cm2/BSA  RA A Wave 4 mmHg  RA V Wave 3 mmHg  RA Mean 1 mmHg  RV Systolic Pressure 32 mmHg  RV Diastolic Pressure 0 mmHg  RV EDP 3 mmHg  PA Systolic Pressure 33 mmHg  PA Diastolic Pressure 10 mmHg  PA Mean 19 mmHg  PW A Wave 9 mmHg  PW V Wave 7 mmHg  PW Mean 6 mmHg  AO Systolic Pressure 854 mmHg  AO Diastolic Pressure 90 mmHg  AO Mean 627 mmHg  LV Systolic Pressure 035 mmHg  LV Diastolic Pressure 5 mmHg  LV EDP 10 mmHg  Arterial Occlusion  Pressure Extended Systolic Pressure 009 mmHg  Arterial Occlusion Pressure Extended Diastolic Pressure 81 mmHg  Arterial Occlusion Pressure Extended Mean Pressure 117 mmHg  Left Ventricular Apex Extended Systolic Pressure 381 mmHg  Left Ventricular Apex Extended Diastolic Pressure 4 mmHg  Left Ventricular Apex Extended EDP Pressure 7 mmHg  QP/QS 1  TPVR Index 5.22 HRUI  TSVR Index 30.79 HRUI  PVR SVR Ratio 0.12  TPVR/TSVR Ratio 0.17     CT ANGIOGRAPHY CHEST, ABDOMEN AND PELVIS  TECHNIQUE: Multidetector CT imaging through the chest, abdomen and pelvis was performed using the standard protocol during bolus administration of intravenous contrast. Multiplanar reconstructed images and MIPs were obtained and reviewed to evaluate the vascular anatomy.  CONTRAST: 100 mL of Isovue 370.  COMPARISON: No priors.  FINDINGS: CTA CHEST FINDINGS  Cardiovascular: Heart size is mildly enlarged. There is no significant pericardial fluid, thickening or pericardial calcification. There is aortic atherosclerosis, as well as atherosclerosis of the great vessels of the mediastinum and the coronary arteries, including calcified atherosclerotic plaque in the left main, left anterior descending, left circumflex and right coronary arteries. Severe thickening calcifications of the aortic valve. Calcifications of the mitral annulus. Left-sided pacemaker device in position with lead tips terminating in the right atrial appendage and right ventricular apex.  Mediastinum/Lymph Nodes: No pathologically enlarged mediastinal or hilar lymph nodes. Esophagus is unremarkable in appearance. No axillary lymphadenopathy.  Lungs/Pleura: 14 x 8 mm (mean diameter of 11 mm) left lower lobe pulmonary nodule with some surrounding ground-glass attenuation (axial image 42 of series 6). 9 x 5 mm (mean diameter of 7 mm) subpleural nodule in the periphery of the right upper lobe (axial image 20 of series 6). A  few patchy areas of peripheral predominant ground-glass attenuation and subpleural reticulation are noted, scattered randomly throughout the lungs bilaterally, without a definitive craniocaudal gradient. No acute consolidative airspace disease. No pleural effusions.  Musculoskeletal/Soft Tissues: There are no aggressive appearing lytic or blastic lesions noted in the visualized portions of the skeleton.  CTA ABDOMEN AND PELVIS FINDINGS  Hepatobiliary: No suspicious cystic or solid hepatic lesions. No intra or extrahepatic biliary ductal dilatation. Gallbladder is nearly completely decompressed, but otherwise unremarkable in appearance.  Pancreas: No pancreatic mass. No pancreatic ductal dilatation. No pancreatic or peripancreatic fluid or inflammatory changes.  Spleen: Unremarkable.  Adrenals/Urinary Tract: Multiple renal lesions are noted in the kidneys bilaterally. Although several of these lesions are low-attenuation, compatible with cysts, other lesions are  intermediate to high attenuation, potentially enhancing (incompletely evaluated on today's contrast enhanced CT examination). Bilateral adrenal glands are unremarkable in appearance. No hydroureteronephrosis. Urinary bladder is normal in appearance.  Stomach/Bowel: The appearance of the stomach is normal. There is no pathologic dilatation of small bowel or colon. Normal appendix.  Vascular/Lymphatic: Aortic atherosclerosis, without evidence of aneurysm or dissection in the abdominal or pelvic vasculature. Vascular findings and measurements pertinent to potential TAVR procedure, as detailed below. Celiac axis, superior mesenteric artery and inferior mesenteric artery are all widely patent, without definite hemodynamically significant stenosis. Single renal arteries bilaterally are widely patent. No lymphadenopathy noted in the abdomen or pelvis.  Reproductive: Prostate gland and seminal vesicles are  unremarkable in appearance.  Other: No significant volume of ascites. No pneumoperitoneum.  Musculoskeletal: There are no aggressive appearing lytic or blastic lesions noted in the visualized portions of the skeleton.  VASCULAR MEASUREMENTS PERTINENT TO TAVR:  AORTA:  Minimal Aortic Diameter - 16 x 14 mm  Severity of Aortic Calcification - moderate  RIGHT PELVIS:  Right Common Iliac Artery -  Minimal Diameter - 11.0 x 13.0 mm  Tortuosity - mild  Calcification - mild  Right External Iliac Artery -  Minimal Diameter - 9.4 x 8.8 mm  Tortuosity - severe  Calcification - none  Right Common Femoral Artery -  Minimal Diameter - 8.9 x 10.0 mm  Tortuosity - mild  Calcification - mild  LEFT PELVIS:  Left Common Iliac Artery -  Minimal Diameter - 13.3 x 12.3 mm  Tortuosity - mild  Calcification - mild  Left External Iliac Artery -  Minimal Diameter - 9.8 x 9.9 mm  Tortuosity - severe  Calcification - minimal  Left Common Femoral Artery -  Minimal Diameter - 9.8 x 10.7 mm  Tortuosity - mild  Calcification - none  Review of the MIP images confirms the above findings.  IMPRESSION: 1. Vascular findings and measurements pertinent to potential TAVR procedure, as detailed above. Patient does appear to have suitable pelvic arterial access bilaterally. 2. Severe thickening calcification of the aortic valve, compatible with the reported clinical history of severe aortic stenosis. 3. Pulmonary nodules in the lungs bilaterally, the largest of which has a mean diameter of 11 mm in the left lower lobe (axial image 42 of series 6). Non-contrast chest CT at 3-6 months is recommended. If the nodules are stable at time of repeat CT, then future CT at 18-24 months (from today's scan) is considered optional for low-risk patients, but is recommended for high-risk patients. This recommendation follows the consensus statement:  Guidelines for Management of Incidental Pulmonary Nodules Detected on CT Images: From the Fleischner Society 2017; Radiology 2017; 284:228-243. 4. Aortic atherosclerosis, in addition to left main and 3 vessel coronary artery disease. Assessment for potential risk factor modification, dietary therapy or pharmacologic therapy may be warranted, if clinically indicated. 5. **An incidental finding of potential clinical significance has been found. Innumerable renal lesions bilaterally. Unfortunately, many of these are not characterized on today's examination. Any one or more of these could represent solid renal neoplasm(s). If the patient's pacemaker is MRI compatible, further evaluation with nonemergent MRI of the abdomen with and without IV gadolinium would be recommended in the near future to exclude underlying neoplasm. Alternatively, CT of the abdomen with and without IV gadolinium could be performed if pacemaker is not MRI compatible. ** 6. Additional incidental findings, as above.   Electronically Signed By: Vinnie Langton M.D. On: 11/28/2016 14:46   Impression:  I  have personally reviewed the patient's transthoracic and transesophageal echocardiograms, diagnostic cardiac catheterization and CT angiogram. 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 The possibility that this is a vegetation seems relatively unlikely given the absence of any other clinical signs or symptoms to suggest the possibility of endocarditis. Other less likely explanations might include thrombus or fractured plaque and fibrous debris from the posterior annulus which does have some degree of degenerative fibrosis and calcification. The presence of this mass is concerning given the  likelihood that the patient sustained a recent TIA that may have been embolic in source. Some of the patient's symptoms may have been related to hypotension that developed in the setting of complete heart block, but some of the description given by both the patient and his son remained concerning for possible unilateral neurologic symptoms. Under the circumstances I agree that it would be very appropriate to consider surgical intervention for resection of the patient's left atrial mass to eliminate any further risk of embolization. The patient also has moderate aortic stenosis. The aortic valve is trileaflet and there is some fibrosis and restricted leaflet mobility involving all 3 leaflets with particularly significant calcification and restriction of the noncoronary leaflet. Peak velocity across the aortic valve measured close to 4 m/s, consistent with moderate aortic stenosis which was confirmed at catheterization. Catheterization was notable for the absence of significant flow limiting coronary artery disease. Under the circumstances options include proceeding with surgery for resection of left atrial mass, possible mitral valve repair, and aortic valve replacement versus continued medical therapy without intervention.  The patient's case and diagnostic tests have been reviewed with a multidisciplinary team at specialists. We would not recommend resection of the patient's left atrial mass without concomitant aortic valve replacement at this time because the patient would be at fairly high risk of significant progression of aortic valve disease in the very near future.  In this context the risks of surgery need to be balanced against the risk of stroke if surgery is not performed.     Plan:  I have again reviewed the indications, risks, and potential benefits of surgery with the patient and his 2 sons in the office today.  Treatment alternatives have been discussed in detail.  The patient and both his  sons acknowledge that the patient has experienced some degree of significant functional decline over the past year or more with cognitive problems consistent with mild dementia. There is no question that proceeding with open cardiac surgery in this setting would come with somewhat increased risk of stroke and/or exacerbation of the patient's ongoing cognitive dysfunction with the possibility of significant functional decline.   They understand and accept all potential associated risks of surgery including but not limited to risk of death, stroke, myocardial infarction, congestive heart failure, respiratory failure, renal failure, pneumonia, bleeding requiring blood transfusion and or reexploration, arrhythmia, damage to leads of the pre-existing permanent pacemaker, aortic dissection or other major vascular complication, pleural effusions or other delayed complications related to continued congestive heart failure, and other late complications related to valve replacement including structural valve deterioration and failure, thrombosis, endocarditis, or paravalvular leak.     Valentina Gu. Roxy Manns, MD 11/29/2016 12:47 PM

## 2016-12-01 ENCOUNTER — Encounter: Payer: Self-pay | Admitting: *Deleted

## 2016-12-01 ENCOUNTER — Ambulatory Visit (INDEPENDENT_AMBULATORY_CARE_PROVIDER_SITE_OTHER): Payer: Medicare Other | Admitting: *Deleted

## 2016-12-01 ENCOUNTER — Encounter (HOSPITAL_COMMUNITY): Payer: Self-pay | Admitting: *Deleted

## 2016-12-01 DIAGNOSIS — I442 Atrioventricular block, complete: Secondary | ICD-10-CM | POA: Diagnosis not present

## 2016-12-01 LAB — CUP PACEART INCLINIC DEVICE CHECK
Battery Remaining Longevity: 137 mo
Battery Voltage: 3.2 V
Brady Statistic RA Percent Paced: 19.55 %
Brady Statistic RV Percent Paced: 100 %
Date Time Interrogation Session: 20180419163841
Implantable Lead Implant Date: 20180409
Implantable Lead Location: 753860
Implantable Lead Model: 5076
Implantable Pulse Generator Implant Date: 20180409
Lead Channel Pacing Threshold Amplitude: 0.625 V
Lead Channel Pacing Threshold Pulse Width: 0.4 ms
Lead Channel Sensing Intrinsic Amplitude: 2.75 mV
Lead Channel Sensing Intrinsic Amplitude: 3.75 mV
Lead Channel Setting Pacing Pulse Width: 0.4 ms
MDC IDC LEAD IMPLANT DT: 20180409
MDC IDC LEAD LOCATION: 753859
MDC IDC MSMT LEADCHNL RA IMPEDANCE VALUE: 304 Ohm
MDC IDC MSMT LEADCHNL RA IMPEDANCE VALUE: 380 Ohm
MDC IDC MSMT LEADCHNL RV IMPEDANCE VALUE: 399 Ohm
MDC IDC MSMT LEADCHNL RV IMPEDANCE VALUE: 532 Ohm
MDC IDC MSMT LEADCHNL RV PACING THRESHOLD AMPLITUDE: 0.875 V
MDC IDC MSMT LEADCHNL RV PACING THRESHOLD PULSEWIDTH: 0.4 ms
MDC IDC MSMT LEADCHNL RV SENSING INTR AMPL: 18.375 mV
MDC IDC SET LEADCHNL RA PACING AMPLITUDE: 3.5 V
MDC IDC SET LEADCHNL RV PACING AMPLITUDE: 3.5 V
MDC IDC SET LEADCHNL RV SENSING SENSITIVITY: 2 mV
MDC IDC STAT BRADY AP VP PERCENT: 19.6 %
MDC IDC STAT BRADY AP VS PERCENT: 0 %
MDC IDC STAT BRADY AS VP PERCENT: 80.39 %
MDC IDC STAT BRADY AS VS PERCENT: 0 %

## 2016-12-01 MED ORDER — POTASSIUM CHLORIDE 2 MEQ/ML IV SOLN
80.0000 meq | INTRAVENOUS | Status: DC
  Filled 2016-12-01: qty 40

## 2016-12-01 MED ORDER — DEXTROSE 5 % IV SOLN
750.0000 mg | INTRAVENOUS | Status: DC
  Filled 2016-12-01: qty 750

## 2016-12-01 MED ORDER — PLASMA-LYTE 148 IV SOLN
INTRAVENOUS | Status: DC
  Filled 2016-12-01: qty 2.5

## 2016-12-01 MED ORDER — VANCOMYCIN HCL 1000 MG IV SOLR
INTRAVENOUS | Status: AC
  Administered 2016-12-02: 1000 mL
  Filled 2016-12-01 (×2): qty 1000

## 2016-12-01 MED ORDER — SODIUM CHLORIDE 0.9 % IV SOLN
30.0000 ug/min | INTRAVENOUS | Status: AC
  Administered 2016-12-02: 15 ug/min via INTRAVENOUS
  Filled 2016-12-01: qty 2

## 2016-12-01 MED ORDER — HEPARIN SODIUM (PORCINE) 1000 UNIT/ML IJ SOLN
INTRAMUSCULAR | Status: DC
  Filled 2016-12-01: qty 30

## 2016-12-01 MED ORDER — METOPROLOL TARTRATE 12.5 MG HALF TABLET
12.5000 mg | ORAL_TABLET | Freq: Once | ORAL | Status: DC
  Filled 2016-12-01: qty 1

## 2016-12-01 MED ORDER — EPINEPHRINE PF 1 MG/ML IJ SOLN
0.0000 ug/min | INTRAVENOUS | Status: DC
  Filled 2016-12-01: qty 4

## 2016-12-01 MED ORDER — MANNITOL 20% IV SOLUTION 10G/50ML
12.0000 g | INTRAVENOUS | Status: DC
  Filled 2016-12-01 (×2): qty 60

## 2016-12-01 MED ORDER — DOPAMINE-DEXTROSE 3.2-5 MG/ML-% IV SOLN
0.0000 ug/kg/min | INTRAVENOUS | Status: DC
  Filled 2016-12-01: qty 250

## 2016-12-01 MED ORDER — MAGNESIUM SULFATE 50 % IJ SOLN
40.0000 meq | INTRAMUSCULAR | Status: DC
  Filled 2016-12-01: qty 10

## 2016-12-01 MED ORDER — NITROGLYCERIN IN D5W 200-5 MCG/ML-% IV SOLN
2.0000 ug/min | INTRAVENOUS | Status: AC
  Administered 2016-12-02: 16 ug/min via INTRAVENOUS
  Filled 2016-12-01: qty 250

## 2016-12-01 MED ORDER — VANCOMYCIN HCL 10 G IV SOLR
1500.0000 mg | INTRAVENOUS | Status: AC
  Administered 2016-12-02: 1500 mg via INTRAVENOUS
  Filled 2016-12-01 (×2): qty 1500

## 2016-12-01 MED ORDER — CEFUROXIME SODIUM 1.5 G IJ SOLR
1.5000 g | INTRAMUSCULAR | Status: AC
  Administered 2016-12-02: .75 g via INTRAVENOUS
  Administered 2016-12-02: 1.5 g via INTRAVENOUS
  Filled 2016-12-01: qty 1.5

## 2016-12-01 MED ORDER — LIDOCAINE HCL (CARDIAC) 20 MG/ML IV SOLN
260.0000 mg | INTRAVENOUS | Status: DC
  Filled 2016-12-01 (×2): qty 15

## 2016-12-01 MED ORDER — SODIUM CHLORIDE 0.9 % IV SOLN
INTRAVENOUS | Status: AC
  Administered 2016-12-02: 1.4 [IU]/h via INTRAVENOUS
  Filled 2016-12-01: qty 2.5

## 2016-12-01 MED ORDER — DEXMEDETOMIDINE HCL IN NACL 400 MCG/100ML IV SOLN
0.1000 ug/kg/h | INTRAVENOUS | Status: AC
  Administered 2016-12-02: .3 ug/kg/h via INTRAVENOUS
  Filled 2016-12-01: qty 100

## 2016-12-01 MED ORDER — TRANEXAMIC ACID 1000 MG/10ML IV SOLN
1.5000 mg/kg/h | INTRAVENOUS | Status: AC
  Administered 2016-12-02: 1.5 mg/kg/h via INTRAVENOUS
  Filled 2016-12-01: qty 25

## 2016-12-01 MED ORDER — TRANEXAMIC ACID (OHS) PUMP PRIME SOLUTION
2.0000 mg/kg | INTRAVENOUS | Status: DC
  Filled 2016-12-01: qty 1.97

## 2016-12-01 MED ORDER — TRANEXAMIC ACID (OHS) BOLUS VIA INFUSION
15.0000 mg/kg | INTRAVENOUS | Status: AC
  Administered 2016-12-02: 1476 mg via INTRAVENOUS
  Filled 2016-12-01: qty 1476

## 2016-12-01 NOTE — Progress Notes (Signed)
[name] James Curtis [facility]). James Curtis denies that pt had a stress test. James Curtis made aware to have pt stop taking vitamins, Apoaequorin ( Prevagen memory supplement), fish oil and herbal medications. Do not take any NSAIDs ie: Ibuprofen, Advil, Naproxen, BC and Goody Powder. James Curtis verbalized understanding of all pre-op instructions. Please measure pt neck DOS to complete Sleep Apnea Screening.

## 2016-12-01 NOTE — Progress Notes (Signed)
Wound check appointment. Steri-strips removed. Wound without redness or edema. Incision edges approximated, wound well healed. Normal device function. Thresholds, sensing, and impedances consistent with implant measurements. Device programmed at 3.5V for extra safety margin until 3 month visit. Histogram distribution appropriate for patient and level of activity. No mode switches or high ventricular rates noted. Patient educated about wound care, arm mobility, lifting restrictions. ROV in 3 months with WC.

## 2016-12-01 NOTE — Anesthesia Preprocedure Evaluation (Addendum)
Anesthesia Evaluation    Reviewed: Allergy & Precautions, H&P , Patient's Chart, lab work & pertinent test results  Airway Mallampati: III  TM Distance: >3 FB Neck ROM: Full    Dental  (+) Teeth Intact, Dental Advisory Given   Pulmonary neg pulmonary ROS, former smoker,    breath sounds clear to auscultation       Cardiovascular Exercise Tolerance: Good hypertension, Pt. on medications negative cardio ROS  + pacemaker + Valvular Problems/Murmurs AS  Rhythm:Regular Rate:Normal + Systolic murmurs    Neuro/Psych TIAnegative neurological ROS  negative psych ROS   GI/Hepatic negative GI ROS, Neg liver ROS,   Endo/Other  negative endocrine ROSdiabetes  Renal/GU negative Renal ROS  negative genitourinary   Musculoskeletal   Abdominal   Peds  Hematology negative hematology ROS (+)   Anesthesia Other Findings   Reproductive/Obstetrics negative OB ROS                            Anesthesia Physical Anesthesia Plan  ASA: IV  Anesthesia Plan: General   Post-op Pain Management:    Induction: Intravenous  Airway Management Planned: Oral ETT  Additional Equipment: Arterial line, CVP, PA Cath, TEE, 3D TEE and Ultrasound Guidance Line Placement  Intra-op Plan:   Post-operative Plan: Post-operative intubation/ventilation  Informed Consent: I have reviewed the patients History and Physical, chart, labs and discussed the procedure including the risks, benefits and alternatives for the proposed anesthesia with the patient or authorized representative who has indicated his/her understanding and acceptance.   Dental advisory given  Plan Discussed with: CRNA  Anesthesia Plan Comments:         Anesthesia Quick Evaluation

## 2016-12-02 ENCOUNTER — Inpatient Hospital Stay (HOSPITAL_COMMUNITY): Payer: Medicare Other

## 2016-12-02 ENCOUNTER — Inpatient Hospital Stay (HOSPITAL_COMMUNITY): Payer: Medicare Other | Admitting: Anesthesiology

## 2016-12-02 ENCOUNTER — Inpatient Hospital Stay (HOSPITAL_COMMUNITY)
Admission: RE | Admit: 2016-12-02 | Discharge: 2016-12-09 | DRG: 219 | Disposition: A | Discharge status: Skilled Nursing Facility | Payer: Medicare Other | Source: Ambulatory Visit | Attending: Thoracic Surgery (Cardiothoracic Vascular Surgery) | Admitting: Thoracic Surgery (Cardiothoracic Vascular Surgery)

## 2016-12-02 ENCOUNTER — Encounter (HOSPITAL_COMMUNITY)
Admission: RE | Disposition: A | Discharge status: Skilled Nursing Facility | Payer: Self-pay | Source: Ambulatory Visit | Attending: Thoracic Surgery (Cardiothoracic Vascular Surgery)

## 2016-12-02 ENCOUNTER — Encounter (HOSPITAL_COMMUNITY): Payer: Self-pay | Admitting: *Deleted

## 2016-12-02 DIAGNOSIS — F0390 Unspecified dementia without behavioral disturbance: Secondary | ICD-10-CM | POA: Diagnosis present

## 2016-12-02 DIAGNOSIS — I083 Combined rheumatic disorders of mitral, aortic and tricuspid valves: Secondary | ICD-10-CM | POA: Diagnosis not present

## 2016-12-02 DIAGNOSIS — R0602 Shortness of breath: Secondary | ICD-10-CM | POA: Diagnosis not present

## 2016-12-02 DIAGNOSIS — E119 Type 2 diabetes mellitus without complications: Secondary | ICD-10-CM

## 2016-12-02 DIAGNOSIS — R931 Abnormal findings on diagnostic imaging of heart and coronary circulation: Secondary | ICD-10-CM | POA: Diagnosis not present

## 2016-12-02 DIAGNOSIS — Z87891 Personal history of nicotine dependence: Secondary | ICD-10-CM

## 2016-12-02 DIAGNOSIS — I5032 Chronic diastolic (congestive) heart failure: Secondary | ICD-10-CM | POA: Diagnosis present

## 2016-12-02 DIAGNOSIS — J90 Pleural effusion, not elsewhere classified: Secondary | ICD-10-CM | POA: Diagnosis not present

## 2016-12-02 DIAGNOSIS — I5189 Other ill-defined heart diseases: Secondary | ICD-10-CM

## 2016-12-02 DIAGNOSIS — Z950 Presence of cardiac pacemaker: Secondary | ICD-10-CM

## 2016-12-02 DIAGNOSIS — R911 Solitary pulmonary nodule: Secondary | ICD-10-CM | POA: Diagnosis present

## 2016-12-02 DIAGNOSIS — I700 Atherosclerosis of aorta: Secondary | ICD-10-CM | POA: Diagnosis present

## 2016-12-02 DIAGNOSIS — J9811 Atelectasis: Secondary | ICD-10-CM

## 2016-12-02 DIAGNOSIS — D62 Acute posthemorrhagic anemia: Secondary | ICD-10-CM | POA: Diagnosis not present

## 2016-12-02 DIAGNOSIS — R262 Difficulty in walking, not elsewhere classified: Secondary | ICD-10-CM | POA: Diagnosis not present

## 2016-12-02 DIAGNOSIS — Z953 Presence of xenogenic heart valve: Secondary | ICD-10-CM

## 2016-12-02 DIAGNOSIS — F0391 Unspecified dementia with behavioral disturbance: Secondary | ICD-10-CM | POA: Diagnosis not present

## 2016-12-02 DIAGNOSIS — R488 Other symbolic dysfunctions: Secondary | ICD-10-CM | POA: Diagnosis not present

## 2016-12-02 DIAGNOSIS — Z952 Presence of prosthetic heart valve: Secondary | ICD-10-CM

## 2016-12-02 DIAGNOSIS — J811 Chronic pulmonary edema: Secondary | ICD-10-CM | POA: Diagnosis not present

## 2016-12-02 DIAGNOSIS — T8383XA Hemorrhage of genitourinary prosthetic devices, implants and grafts, initial encounter: Secondary | ICD-10-CM | POA: Diagnosis not present

## 2016-12-02 DIAGNOSIS — I10 Essential (primary) hypertension: Secondary | ICD-10-CM | POA: Diagnosis present

## 2016-12-02 DIAGNOSIS — Z8673 Personal history of transient ischemic attack (TIA), and cerebral infarction without residual deficits: Secondary | ICD-10-CM | POA: Diagnosis not present

## 2016-12-02 DIAGNOSIS — Z8546 Personal history of malignant neoplasm of prostate: Secondary | ICD-10-CM

## 2016-12-02 DIAGNOSIS — R41841 Cognitive communication deficit: Secondary | ICD-10-CM | POA: Diagnosis not present

## 2016-12-02 DIAGNOSIS — I519 Heart disease, unspecified: Secondary | ICD-10-CM | POA: Diagnosis not present

## 2016-12-02 DIAGNOSIS — I2510 Atherosclerotic heart disease of native coronary artery without angina pectoris: Secondary | ICD-10-CM | POA: Diagnosis present

## 2016-12-02 DIAGNOSIS — I517 Cardiomegaly: Secondary | ICD-10-CM | POA: Diagnosis not present

## 2016-12-02 DIAGNOSIS — I350 Nonrheumatic aortic (valve) stenosis: Secondary | ICD-10-CM | POA: Diagnosis not present

## 2016-12-02 DIAGNOSIS — I080 Rheumatic disorders of both mitral and aortic valves: Secondary | ICD-10-CM | POA: Diagnosis not present

## 2016-12-02 DIAGNOSIS — N2889 Other specified disorders of kidney and ureter: Secondary | ICD-10-CM | POA: Diagnosis present

## 2016-12-02 DIAGNOSIS — Z79899 Other long term (current) drug therapy: Secondary | ICD-10-CM

## 2016-12-02 DIAGNOSIS — N183 Chronic kidney disease, stage 3 unspecified: Secondary | ICD-10-CM | POA: Diagnosis present

## 2016-12-02 DIAGNOSIS — R079 Chest pain, unspecified: Secondary | ICD-10-CM | POA: Diagnosis not present

## 2016-12-02 DIAGNOSIS — I38 Endocarditis, valve unspecified: Secondary | ICD-10-CM | POA: Diagnosis not present

## 2016-12-02 DIAGNOSIS — E1122 Type 2 diabetes mellitus with diabetic chronic kidney disease: Secondary | ICD-10-CM | POA: Diagnosis not present

## 2016-12-02 DIAGNOSIS — Z923 Personal history of irradiation: Secondary | ICD-10-CM | POA: Diagnosis not present

## 2016-12-02 DIAGNOSIS — Z48812 Encounter for surgical aftercare following surgery on the circulatory system: Secondary | ICD-10-CM | POA: Diagnosis not present

## 2016-12-02 DIAGNOSIS — I515 Myocardial degeneration: Secondary | ICD-10-CM | POA: Diagnosis not present

## 2016-12-02 DIAGNOSIS — I442 Atrioventricular block, complete: Secondary | ICD-10-CM | POA: Diagnosis present

## 2016-12-02 DIAGNOSIS — I058 Other rheumatic mitral valve diseases: Secondary | ICD-10-CM | POA: Diagnosis present

## 2016-12-02 DIAGNOSIS — Y846 Urinary catheterization as the cause of abnormal reaction of the patient, or of later complication, without mention of misadventure at the time of the procedure: Secondary | ICD-10-CM | POA: Diagnosis not present

## 2016-12-02 DIAGNOSIS — E785 Hyperlipidemia, unspecified: Secondary | ICD-10-CM | POA: Diagnosis present

## 2016-12-02 DIAGNOSIS — I130 Hypertensive heart and chronic kidney disease with heart failure and stage 1 through stage 4 chronic kidney disease, or unspecified chronic kidney disease: Secondary | ICD-10-CM | POA: Diagnosis present

## 2016-12-02 DIAGNOSIS — Z4682 Encounter for fitting and adjustment of non-vascular catheter: Secondary | ICD-10-CM

## 2016-12-02 DIAGNOSIS — N170 Acute kidney failure with tubular necrosis: Secondary | ICD-10-CM | POA: Diagnosis not present

## 2016-12-02 DIAGNOSIS — Z7982 Long term (current) use of aspirin: Secondary | ICD-10-CM | POA: Diagnosis not present

## 2016-12-02 DIAGNOSIS — G459 Transient cerebral ischemic attack, unspecified: Secondary | ICD-10-CM | POA: Diagnosis present

## 2016-12-02 DIAGNOSIS — M6281 Muscle weakness (generalized): Secondary | ICD-10-CM | POA: Diagnosis not present

## 2016-12-02 HISTORY — PX: TEE WITHOUT CARDIOVERSION: SHX5443

## 2016-12-02 HISTORY — DX: Chronic kidney disease, unspecified: N18.9

## 2016-12-02 HISTORY — PX: AORTIC VALVE REPLACEMENT: SHX41

## 2016-12-02 HISTORY — PX: EXCISION OF ATRIAL MYXOMA: SHX5821

## 2016-12-02 HISTORY — DX: Other ill-defined heart diseases: I51.89

## 2016-12-02 HISTORY — DX: Family history of other specified conditions: Z84.89

## 2016-12-02 HISTORY — DX: Presence of xenogenic heart valve: Z95.3

## 2016-12-02 HISTORY — DX: Prediabetes: R73.03

## 2016-12-02 LAB — ECHO TEE
AO mean calculated velocity dopler: 163 cm/s
AV Area VTI index: 0.47 cm2/m2
AV Area VTI: 0.98 cm2
AV Area mean vel: 0.97 cm2
AV Mean grad: 13 mmHg
AV Peak grad: 22 mmHg
AV area mean vel ind: 0.47 cm2/m2
AV pk vel: 237 cm/s
AVCELMEANRAT: 0.38
Ao pk vel: 0.39 m/s
CHL CUP AV PEAK INDEX: 0.47
CHL CUP AV VEL: 0.98
CHL CUP DOP CALC LVOT VTI: 19.2 cm
CHL CUP LVOT MV VTI: 1.13
CHL CUP MV M VEL: 102
LDCA: 2.54 cm2
LVOT MV VTI INDEX: 0.54 cm2/m2
LVOT SV: 49 mL
LVOTD: 18 mm
LVOTPV: 91.8 cm/s
LVOTVTI: 0.39 cm
MVANNULUSVTI: 43.2 cm
Mean grad: 5 mmHg
VTI: 49.6 cm
Valve area index: 0.47
Valve area: 0.98 cm2

## 2016-12-02 LAB — POCT I-STAT 4, (NA,K, GLUC, HGB,HCT)
Glucose, Bld: 138 mg/dL — ABNORMAL HIGH (ref 65–99)
HEMATOCRIT: 19 % — AB (ref 39.0–52.0)
Hemoglobin: 6.5 g/dL — CL (ref 13.0–17.0)
POTASSIUM: 4.9 mmol/L (ref 3.5–5.1)
SODIUM: 142 mmol/L (ref 135–145)

## 2016-12-02 LAB — POCT I-STAT 3, ART BLOOD GAS (G3+)
ACID-BASE DEFICIT: 3 mmol/L — AB (ref 0.0–2.0)
Acid-Base Excess: 1 mmol/L (ref 0.0–2.0)
Acid-base deficit: 2 mmol/L (ref 0.0–2.0)
Acid-base deficit: 5 mmol/L — ABNORMAL HIGH (ref 0.0–2.0)
Acid-base deficit: 3 mmol/L — ABNORMAL HIGH (ref 0.0–2.0)
BICARBONATE: 21.5 mmol/L (ref 20.0–28.0)
BICARBONATE: 23.5 mmol/L (ref 20.0–28.0)
Bicarbonate: 24.6 mmol/L (ref 20.0–28.0)
Bicarbonate: 25.2 mmol/L (ref 20.0–28.0)
Bicarbonate: 23.5 mmol/L (ref 20.0–28.0)
O2 SAT: 100 %
O2 SAT: 99 %
O2 SAT: 91 %
O2 Saturation: 100 %
O2 Saturation: 91 %
PCO2 ART: 38.7 mmHg (ref 32.0–48.0)
PCO2 ART: 45 mmHg (ref 32.0–48.0)
PCO2 ART: 44.5 mmHg (ref 32.0–48.0)
PCO2 ART: 46.2 mmHg (ref 32.0–48.0)
PH ART: 7.422 (ref 7.350–7.450)
PO2 ART: 470 mmHg — AB (ref 83.0–108.0)
PO2 ART: 125 mmHg — AB (ref 83.0–108.0)
Patient temperature: 35.8
Patient temperature: 37
Patient temperature: 36.7
TCO2: 26 mmol/L (ref 0–100)
TCO2: 26 mmol/L (ref 0–100)
TCO2: 25 mmol/L (ref 0–100)
TCO2: 23 mmol/L (ref 0–100)
TCO2: 25 mmol/L (ref 0–100)
pCO2 arterial: 49.5 mmHg — ABNORMAL HIGH (ref 32.0–48.0)
pH, Arterial: 7.305 — ABNORMAL LOW (ref 7.350–7.450)
pH, Arterial: 7.32 — ABNORMAL LOW (ref 7.350–7.450)
pH, Arterial: 7.292 — ABNORMAL LOW (ref 7.350–7.450)
pH, Arterial: 7.313 — ABNORMAL LOW (ref 7.350–7.450)
pO2, Arterial: 237 mmHg — ABNORMAL HIGH (ref 83.0–108.0)
pO2, Arterial: 68 mmHg — ABNORMAL LOW (ref 83.0–108.0)
pO2, Arterial: 66 mmHg — ABNORMAL LOW (ref 83.0–108.0)

## 2016-12-02 LAB — POCT I-STAT, CHEM 8
BUN: 19 mg/dL (ref 6–20)
BUN: 14 mg/dL (ref 6–20)
BUN: 19 mg/dL (ref 6–20)
BUN: 18 mg/dL (ref 6–20)
BUN: 18 mg/dL (ref 6–20)
CALCIUM ION: 1.29 mmol/L (ref 1.15–1.40)
CALCIUM ION: 1.14 mmol/L — AB (ref 1.15–1.40)
CHLORIDE: 102 mmol/L (ref 101–111)
CHLORIDE: 107 mmol/L (ref 101–111)
CHLORIDE: 104 mmol/L (ref 101–111)
CHLORIDE: 107 mmol/L (ref 101–111)
CREATININE: 1.3 mg/dL — AB (ref 0.61–1.24)
Calcium, Ion: 0.94 mmol/L — ABNORMAL LOW (ref 1.15–1.40)
Calcium, Ion: 1.3 mmol/L (ref 1.15–1.40)
Calcium, Ion: 1.22 mmol/L (ref 1.15–1.40)
Chloride: 107 mmol/L (ref 101–111)
Creatinine, Ser: 0.8 mg/dL (ref 0.61–1.24)
Creatinine, Ser: 1.2 mg/dL (ref 0.61–1.24)
Creatinine, Ser: 1.1 mg/dL (ref 0.61–1.24)
Creatinine, Ser: 1.2 mg/dL (ref 0.61–1.24)
GLUCOSE: 142 mg/dL — AB (ref 65–99)
Glucose, Bld: 129 mg/dL — ABNORMAL HIGH (ref 65–99)
Glucose, Bld: 116 mg/dL — ABNORMAL HIGH (ref 65–99)
Glucose, Bld: 99 mg/dL (ref 65–99)
Glucose, Bld: 137 mg/dL — ABNORMAL HIGH (ref 65–99)
HCT: 26 % — ABNORMAL LOW (ref 39.0–52.0)
HCT: 18 % — ABNORMAL LOW (ref 39.0–52.0)
HCT: 27 % — ABNORMAL LOW (ref 39.0–52.0)
HEMATOCRIT: 28 % — AB (ref 39.0–52.0)
HEMATOCRIT: 20 % — AB (ref 39.0–52.0)
HEMOGLOBIN: 9.5 g/dL — AB (ref 13.0–17.0)
HEMOGLOBIN: 8.8 g/dL — AB (ref 13.0–17.0)
Hemoglobin: 6.8 g/dL — CL (ref 13.0–17.0)
Hemoglobin: 6.1 g/dL — CL (ref 13.0–17.0)
Hemoglobin: 9.2 g/dL — ABNORMAL LOW (ref 13.0–17.0)
POTASSIUM: 4.8 mmol/L (ref 3.5–5.1)
Potassium: 4.4 mmol/L (ref 3.5–5.1)
Potassium: 4.4 mmol/L (ref 3.5–5.1)
Potassium: 4.4 mmol/L (ref 3.5–5.1)
Potassium: 4.8 mmol/L (ref 3.5–5.1)
SODIUM: 141 mmol/L (ref 135–145)
SODIUM: 140 mmol/L (ref 135–145)
SODIUM: 141 mmol/L (ref 135–145)
SODIUM: 136 mmol/L (ref 135–145)
Sodium: 143 mmol/L (ref 135–145)
TCO2: 27 mmol/L (ref 0–100)
TCO2: 27 mmol/L (ref 0–100)
TCO2: 29 mmol/L (ref 0–100)
TCO2: 30 mmol/L (ref 0–100)
TCO2: 23 mmol/L (ref 0–100)

## 2016-12-02 LAB — HEMOGLOBIN AND HEMATOCRIT, BLOOD
HEMATOCRIT: 20.8 % — AB (ref 39.0–52.0)
HEMOGLOBIN: 7.1 g/dL — AB (ref 13.0–17.0)

## 2016-12-02 LAB — CBC
HCT: 34.7 % — ABNORMAL LOW (ref 39.0–52.0)
HCT: 28.2 % — ABNORMAL LOW (ref 39.0–52.0)
HEMATOCRIT: 26.5 % — AB (ref 39.0–52.0)
HEMATOCRIT: 21.6 % — AB (ref 39.0–52.0)
HEMOGLOBIN: 9 g/dL — AB (ref 13.0–17.0)
HEMOGLOBIN: 7.5 g/dL — AB (ref 13.0–17.0)
HEMOGLOBIN: 9.9 g/dL — AB (ref 13.0–17.0)
Hemoglobin: 11.5 g/dL — ABNORMAL LOW (ref 13.0–17.0)
MCH: 29.1 pg (ref 26.0–34.0)
MCH: 29.3 pg (ref 26.0–34.0)
MCH: 29.6 pg (ref 26.0–34.0)
MCH: 30.1 pg (ref 26.0–34.0)
MCHC: 33.1 g/dL (ref 30.0–36.0)
MCHC: 34 g/dL (ref 30.0–36.0)
MCHC: 34.7 g/dL (ref 30.0–36.0)
MCHC: 35.1 g/dL (ref 30.0–36.0)
MCV: 87.8 fL (ref 78.0–100.0)
MCV: 86.3 fL (ref 78.0–100.0)
MCV: 85.4 fL (ref 78.0–100.0)
MCV: 85.7 fL (ref 78.0–100.0)
PLATELETS: 216 10*3/uL (ref 150–400)
PLATELETS: 140 10*3/uL — AB (ref 150–400)
Platelets: 94 10*3/uL — ABNORMAL LOW (ref 150–400)
Platelets: 141 10*3/uL — ABNORMAL LOW (ref 150–400)
RBC: 3.95 MIL/uL — AB (ref 4.22–5.81)
RBC: 3.07 MIL/uL — AB (ref 4.22–5.81)
RBC: 2.53 MIL/uL — AB (ref 4.22–5.81)
RBC: 3.29 MIL/uL — ABNORMAL LOW (ref 4.22–5.81)
RDW: 14 % (ref 11.5–15.5)
RDW: 14.1 % (ref 11.5–15.5)
RDW: 14.3 % (ref 11.5–15.5)
RDW: 14.8 % (ref 11.5–15.5)
WBC: 7.5 10*3/uL (ref 4.0–10.5)
WBC: 7.2 10*3/uL (ref 4.0–10.5)
WBC: 10.9 10*3/uL — ABNORMAL HIGH (ref 4.0–10.5)
WBC: 11.8 10*3/uL — ABNORMAL HIGH (ref 4.0–10.5)

## 2016-12-02 LAB — COMPREHENSIVE METABOLIC PANEL
ALT: 34 U/L (ref 17–63)
AST: 27 U/L (ref 15–41)
Albumin: 4.3 g/dL (ref 3.5–5.0)
Alkaline Phosphatase: 68 U/L (ref 38–126)
Anion gap: 14 (ref 5–15)
BUN: 19 mg/dL (ref 6–20)
CALCIUM: 9.8 mg/dL (ref 8.9–10.3)
CHLORIDE: 104 mmol/L (ref 101–111)
CO2: 23 mmol/L (ref 22–32)
CREATININE: 1.44 mg/dL — AB (ref 0.61–1.24)
GFR, EST AFRICAN AMERICAN: 53 mL/min — AB (ref >60)
GFR, EST NON AFRICAN AMERICAN: 46 mL/min — AB (ref >60)
Glucose, Bld: 115 mg/dL — ABNORMAL HIGH (ref 65–99)
Potassium: 4.4 mmol/L (ref 3.5–5.1)
Sodium: 141 mmol/L (ref 135–145)
Total Bilirubin: 0.6 mg/dL (ref 0.3–1.2)
Total Protein: 6.8 g/dL (ref 6.5–8.1)

## 2016-12-02 LAB — CREATININE, SERUM
CREATININE: 1.25 mg/dL — AB (ref 0.61–1.24)
GFR calc Af Amer: >60 mL/min (ref >60)
GFR calc non Af Amer: 54 mL/min — ABNORMAL LOW (ref >60)

## 2016-12-02 LAB — GLUCOSE, CAPILLARY
GLUCOSE-CAPILLARY: 126 mg/dL — AB (ref 65–99)
GLUCOSE-CAPILLARY: 142 mg/dL — AB (ref 65–99)
GLUCOSE-CAPILLARY: 144 mg/dL — AB (ref 65–99)
GLUCOSE-CAPILLARY: 139 mg/dL — AB (ref 65–99)
GLUCOSE-CAPILLARY: 124 mg/dL — AB (ref 65–99)
GLUCOSE-CAPILLARY: 153 mg/dL — AB (ref 65–99)
GLUCOSE-CAPILLARY: 135 mg/dL — AB (ref 65–99)
Glucose-Capillary: 138 mg/dL — ABNORMAL HIGH (ref 65–99)
Glucose-Capillary: 132 mg/dL — ABNORMAL HIGH (ref 65–99)

## 2016-12-02 LAB — PROTIME-INR
INR: 1.69
INR: 1.53
PROTHROMBIN TIME: 20.1 s — AB (ref 11.4–15.2)
PROTHROMBIN TIME: 18.6 s — AB (ref 11.4–15.2)

## 2016-12-02 LAB — APTT
aPTT: 46 seconds — ABNORMAL HIGH (ref 24–36)
aPTT: 43 seconds — ABNORMAL HIGH (ref 24–36)

## 2016-12-02 LAB — PLATELET COUNT: Platelets: 104 10*3/uL — ABNORMAL LOW (ref 150–400)

## 2016-12-02 LAB — PREPARE RBC (CROSSMATCH)

## 2016-12-02 LAB — MAGNESIUM: MAGNESIUM: 3 mg/dL — AB (ref 1.7–2.4)

## 2016-12-02 SURGERY — EXCISION, MYXOMA, CARDIAC ATRIUM
Anesthesia: General | Site: Chest

## 2016-12-02 MED ORDER — ALBUMIN HUMAN 5 % IV SOLN
INTRAVENOUS | Status: DC | PRN
  Administered 2016-12-02 (×2): via INTRAVENOUS

## 2016-12-02 MED ORDER — FAMOTIDINE IN NACL 20-0.9 MG/50ML-% IV SOLN: 20.0000 mg | Freq: Two times a day (BID) | INTRAVENOUS | Status: DC

## 2016-12-02 MED ORDER — LACTATED RINGERS IV SOLN
INTRAVENOUS | Status: DC | PRN
  Administered 2016-12-02: 07:00:00 via INTRAVENOUS

## 2016-12-02 MED ORDER — ROCURONIUM BROMIDE 10 MG/ML (PF) SYRINGE
PREFILLED_SYRINGE | INTRAVENOUS | Status: AC
  Filled 2016-12-02: qty 5

## 2016-12-02 MED ORDER — SODIUM CHLORIDE 0.9 % IV SOLN: Freq: Once | INTRAVENOUS | Status: DC

## 2016-12-02 MED ORDER — SODIUM CHLORIDE 0.9 % IV SOLN
30.0000 meq | Freq: Once | INTRAVENOUS | Status: DC
  Filled 2016-12-02: qty 15

## 2016-12-02 MED ORDER — MIDAZOLAM HCL 2 MG/2ML IJ SOLN
2.0000 mg | INTRAMUSCULAR | Status: DC | PRN
  Administered 2016-12-02 (×2): 2 mg via INTRAVENOUS
  Filled 2016-12-02 (×2): qty 2

## 2016-12-02 MED ORDER — ORAL CARE MOUTH RINSE: 15.0000 mL | Freq: Four times a day (QID) | OROMUCOSAL | Status: DC

## 2016-12-02 MED ORDER — PROPOFOL 10 MG/ML IV BOLUS
INTRAVENOUS | Status: AC
  Filled 2016-12-02: qty 20

## 2016-12-02 MED ORDER — SODIUM CHLORIDE 0.9 % IV SOLN
0.0000 ug/min | INTRAVENOUS | Status: DC
  Filled 2016-12-02: qty 2

## 2016-12-02 MED ORDER — MAGNESIUM SULFATE 4 GM/100ML IV SOLN
4.0000 g | Freq: Once | INTRAVENOUS | Status: AC
  Administered 2016-12-02: 4 g via INTRAVENOUS
  Filled 2016-12-02: qty 100

## 2016-12-02 MED ORDER — GELATIN ABSORBABLE MT POWD
OROMUCOSAL | Status: DC | PRN
  Administered 2016-12-02 (×2): 4 mL via TOPICAL

## 2016-12-02 MED ORDER — SODIUM CHLORIDE 0.9% FLUSH: 3.0000 mL | INTRAVENOUS | Status: DC | PRN

## 2016-12-02 MED ORDER — FENTANYL CITRATE (PF) 250 MCG/5ML IJ SOLN
INTRAMUSCULAR | Status: AC
  Filled 2016-12-02: qty 5

## 2016-12-02 MED ORDER — LIDOCAINE 2% (20 MG/ML) 5 ML SYRINGE
INTRAMUSCULAR | Status: AC
  Filled 2016-12-02: qty 5

## 2016-12-02 MED ORDER — SODIUM CHLORIDE 0.9 % IV SOLN
0.0000 ug/kg/h | INTRAVENOUS | Status: DC
  Administered 2016-12-02 (×2): 0.7 ug/kg/h via INTRAVENOUS
  Administered 2016-12-02: 0.3 ug/kg/h via INTRAVENOUS
  Filled 2016-12-02 (×2): qty 2

## 2016-12-02 MED ORDER — ARTIFICIAL TEARS OP OINT
TOPICAL_OINTMENT | OPHTHALMIC | Status: DC | PRN
  Administered 2016-12-02: 1 via OPHTHALMIC

## 2016-12-02 MED ORDER — BISACODYL 10 MG RE SUPP: 10.0000 mg | Freq: Every day | RECTAL | Status: DC

## 2016-12-02 MED ORDER — MIDAZOLAM HCL 10 MG/2ML IJ SOLN
INTRAMUSCULAR | Status: AC
  Filled 2016-12-02: qty 2

## 2016-12-02 MED ORDER — ALBUMIN HUMAN 5 % IV SOLN
250.0000 mL | INTRAVENOUS | Status: AC | PRN
  Administered 2016-12-02 (×2): 250 mL via INTRAVENOUS

## 2016-12-02 MED ORDER — MIDAZOLAM HCL 5 MG/5ML IJ SOLN
INTRAMUSCULAR | Status: DC | PRN
  Administered 2016-12-02 (×2): 2 mg via INTRAVENOUS
  Administered 2016-12-02: 1 mg via INTRAVENOUS

## 2016-12-02 MED ORDER — HEPARIN SODIUM (PORCINE) 1000 UNIT/ML IJ SOLN
INTRAMUSCULAR | Status: DC | PRN
  Administered 2016-12-02: 28000 [IU] via INTRAVENOUS

## 2016-12-02 MED ORDER — OXYCODONE HCL 5 MG PO TABS: 5.0000 mg | ORAL_TABLET | ORAL | Status: DC | PRN

## 2016-12-02 MED ORDER — BISACODYL 5 MG PO TBEC
10.0000 mg | DELAYED_RELEASE_TABLET | Freq: Every day | ORAL | Status: DC
  Administered 2016-12-03 – 2016-12-07 (×4): 10 mg via ORAL
  Filled 2016-12-02 (×4): qty 2

## 2016-12-02 MED ORDER — ACETAMINOPHEN 650 MG RE SUPP
650.0000 mg | Freq: Once | RECTAL | Status: AC
  Administered 2016-12-02: 650 mg via RECTAL

## 2016-12-02 MED ORDER — ACETAMINOPHEN 160 MG/5ML PO SOLN: 650.0000 mg | Freq: Once | ORAL | Status: AC

## 2016-12-02 MED ORDER — VANCOMYCIN HCL IN DEXTROSE 1-5 GM/200ML-% IV SOLN
1000.0000 mg | Freq: Once | INTRAVENOUS | Status: AC
  Administered 2016-12-02: 1000 mg via INTRAVENOUS
  Filled 2016-12-02: qty 200

## 2016-12-02 MED ORDER — PROTAMINE SULFATE 10 MG/ML IV SOLN
INTRAVENOUS | Status: AC
  Filled 2016-12-02: qty 5

## 2016-12-02 MED ORDER — METOPROLOL TARTRATE 12.5 MG HALF TABLET
12.5000 mg | ORAL_TABLET | Freq: Two times a day (BID) | ORAL | Status: DC
  Administered 2016-12-03 – 2016-12-04 (×4): 12.5 mg via ORAL
  Filled 2016-12-02 (×4): qty 1

## 2016-12-02 MED ORDER — PANTOPRAZOLE SODIUM 40 MG PO TBEC
40.0000 mg | DELAYED_RELEASE_TABLET | Freq: Every day | ORAL | Status: DC
  Administered 2016-12-04 – 2016-12-09 (×6): 40 mg via ORAL
  Filled 2016-12-02 (×6): qty 1

## 2016-12-02 MED ORDER — LACTATED RINGERS IV SOLN
INTRAVENOUS | Status: DC
  Administered 2016-12-02: 20 mL/h via INTRAVENOUS
  Administered 2016-12-04: 11:00:00 via INTRAVENOUS

## 2016-12-02 MED ORDER — SODIUM BICARBONATE 8.4 % IV SOLN
50.0000 meq | Freq: Once | INTRAVENOUS | Status: AC
  Administered 2016-12-02: 50 meq via INTRAVENOUS

## 2016-12-02 MED ORDER — HEPARIN SODIUM (PORCINE) 1000 UNIT/ML IJ SOLN
INTRAMUSCULAR | Status: AC
  Filled 2016-12-02: qty 1

## 2016-12-02 MED ORDER — ORAL CARE MOUTH RINSE
15.0000 mL | Freq: Two times a day (BID) | OROMUCOSAL | Status: DC
  Administered 2016-12-03 – 2016-12-04 (×4): 15 mL via OROMUCOSAL

## 2016-12-02 MED ORDER — HEMOSTATIC AGENTS (NO CHARGE) OPTIME
TOPICAL | Status: DC | PRN
  Administered 2016-12-02 (×2): 1 via TOPICAL

## 2016-12-02 MED ORDER — DOPAMINE-DEXTROSE 3.2-5 MG/ML-% IV SOLN
2.5000 ug/kg/min | INTRAVENOUS | Status: DC
  Administered 2016-12-02: 2.5 ug/kg/min via INTRAVENOUS

## 2016-12-02 MED ORDER — DOCUSATE SODIUM 100 MG PO CAPS
200.0000 mg | ORAL_CAPSULE | Freq: Every day | ORAL | Status: DC
  Administered 2016-12-03 – 2016-12-07 (×4): 200 mg via ORAL
  Filled 2016-12-02 (×5): qty 2

## 2016-12-02 MED ORDER — LACTATED RINGERS IV SOLN
INTRAVENOUS | Status: DC
  Administered 2016-12-02: 10 mL/h via INTRAVENOUS

## 2016-12-02 MED ORDER — FENTANYL CITRATE (PF) 250 MCG/5ML IJ SOLN
INTRAMUSCULAR | Status: DC | PRN
  Administered 2016-12-02: 150 ug via INTRAVENOUS
  Administered 2016-12-02: 100 ug via INTRAVENOUS
  Administered 2016-12-02: 50 ug via INTRAVENOUS
  Administered 2016-12-02 (×3): 150 ug via INTRAVENOUS
  Administered 2016-12-02: 250 ug via INTRAVENOUS
  Administered 2016-12-02: 100 ug via INTRAVENOUS
  Administered 2016-12-02: 700 ug via INTRAVENOUS
  Administered 2016-12-02 (×2): 100 ug via INTRAVENOUS

## 2016-12-02 MED ORDER — CHLORHEXIDINE GLUCONATE 0.12% ORAL RINSE (MEDLINE KIT)
15.0000 mL | Freq: Two times a day (BID) | OROMUCOSAL | Status: DC
  Administered 2016-12-02: 15 mL via OROMUCOSAL

## 2016-12-02 MED ORDER — ROCURONIUM BROMIDE 10 MG/ML (PF) SYRINGE
PREFILLED_SYRINGE | INTRAVENOUS | Status: DC | PRN
  Administered 2016-12-02 (×2): 50 mg via INTRAVENOUS
  Administered 2016-12-02: 100 mg via INTRAVENOUS
  Administered 2016-12-02 (×2): 50 mg via INTRAVENOUS

## 2016-12-02 MED ORDER — SODIUM CHLORIDE 0.9 % IV SOLN
INTRAVENOUS | Status: DC
  Administered 2016-12-02: 100 mL/h via INTRAVENOUS

## 2016-12-02 MED ORDER — MORPHINE SULFATE (PF) 4 MG/ML IV SOLN
1.0000 mg | INTRAVENOUS | Status: DC | PRN
  Administered 2016-12-02: 2 mg via INTRAVENOUS
  Administered 2016-12-02: 1 mg via INTRAVENOUS
  Administered 2016-12-03 – 2016-12-04 (×5): 2 mg via INTRAVENOUS
  Filled 2016-12-02 (×7): qty 1

## 2016-12-02 MED ORDER — PROTAMINE SULFATE 10 MG/ML IV SOLN
INTRAVENOUS | Status: DC | PRN
  Administered 2016-12-02: 10 mg via INTRAVENOUS
  Administered 2016-12-02: 270 mg via INTRAVENOUS

## 2016-12-02 MED ORDER — ASPIRIN EC 325 MG PO TBEC
325.0000 mg | DELAYED_RELEASE_TABLET | Freq: Every day | ORAL | Status: DC
Start: 2016-12-03 — End: 2016-12-05
  Administered 2016-12-03 – 2016-12-04 (×2): 325 mg via ORAL
  Filled 2016-12-02 (×2): qty 1

## 2016-12-02 MED ORDER — LACTATED RINGERS IV SOLN: 500.0000 mL | Freq: Once | INTRAVENOUS | Status: DC | PRN

## 2016-12-02 MED ORDER — NITROGLYCERIN IN D5W 200-5 MCG/ML-% IV SOLN: 0.0000 ug/min | INTRAVENOUS | Status: DC

## 2016-12-02 MED ORDER — 0.9 % SODIUM CHLORIDE (POUR BTL) OPTIME
TOPICAL | Status: DC | PRN
  Administered 2016-12-02: 5000 mL
  Administered 2016-12-02: 1000 mL

## 2016-12-02 MED ORDER — COAGULATION FACTOR VIIA RECOMB 1 MG IV SOLR
45.0000 ug/kg | Freq: Once | INTRAVENOUS | Status: AC
  Administered 2016-12-02: 4000 ug via INTRAVENOUS
  Filled 2016-12-02: qty 4

## 2016-12-02 MED ORDER — SODIUM CHLORIDE 0.9% FLUSH
3.0000 mL | Freq: Two times a day (BID) | INTRAVENOUS | Status: DC
  Administered 2016-12-03 – 2016-12-06 (×7): 3 mL via INTRAVENOUS

## 2016-12-02 MED ORDER — CHLORHEXIDINE GLUCONATE 0.12 % MT SOLN
15.0000 mL | OROMUCOSAL | Status: AC
  Administered 2016-12-02: 15 mL via OROMUCOSAL

## 2016-12-02 MED ORDER — SODIUM CHLORIDE 0.9 % IR SOLN
Status: DC | PRN
  Administered 2016-12-02: 3000 mL

## 2016-12-02 MED ORDER — FENTANYL CITRATE (PF) 250 MCG/5ML IJ SOLN
INTRAMUSCULAR | Status: AC
  Filled 2016-12-02: qty 30

## 2016-12-02 MED ORDER — ONDANSETRON HCL 4 MG/2ML IJ SOLN: 4.0000 mg | Freq: Four times a day (QID) | INTRAMUSCULAR | Status: DC | PRN

## 2016-12-02 MED ORDER — LACTATED RINGERS IV SOLN
INTRAVENOUS | Status: DC | PRN
  Administered 2016-12-02 (×2): via INTRAVENOUS

## 2016-12-02 MED ORDER — INSULIN REGULAR BOLUS VIA INFUSION
0.0000 [IU] | Freq: Three times a day (TID) | INTRAVENOUS | Status: DC
  Filled 2016-12-02: qty 10

## 2016-12-02 MED ORDER — ACETAMINOPHEN 160 MG/5ML PO SOLN: 1000.0000 mg | Freq: Four times a day (QID) | ORAL | Status: DC

## 2016-12-02 MED ORDER — SODIUM CHLORIDE 0.9 % IV SOLN
Freq: Once | INTRAVENOUS | Status: AC
  Administered 2016-12-02: 10 mL/h via INTRAVENOUS

## 2016-12-02 MED ORDER — CHLORHEXIDINE GLUCONATE 0.12 % MT SOLN
15.0000 mL | Freq: Once | OROMUCOSAL | Status: AC
  Administered 2016-12-02: 15 mL via OROMUCOSAL
  Filled 2016-12-02: qty 15

## 2016-12-02 MED ORDER — CHLORHEXIDINE GLUCONATE 4 % EX LIQD
30.0000 mL | CUTANEOUS | Status: DC
Start: 2016-12-02 — End: 2016-12-02

## 2016-12-02 MED ORDER — MANNITOL 20% IV SOLUTION 10G/50ML
6.4000 g | Freq: Once | INTRAVENOUS | Status: DC
  Filled 2016-12-02 (×2): qty 60

## 2016-12-02 MED ORDER — ASPIRIN 81 MG PO CHEW: 324.0000 mg | CHEWABLE_TABLET | Freq: Every day | ORAL | Status: DC

## 2016-12-02 MED ORDER — METOPROLOL TARTRATE 5 MG/5ML IV SOLN
2.5000 mg | INTRAVENOUS | Status: DC | PRN
  Administered 2016-12-02 – 2016-12-05 (×4): 2.5 mg via INTRAVENOUS
  Filled 2016-12-02 (×3): qty 5

## 2016-12-02 MED ORDER — METOPROLOL TARTRATE 25 MG/10 ML ORAL SUSPENSION: 12.5000 mg | Freq: Two times a day (BID) | ORAL | Status: DC

## 2016-12-02 MED ORDER — ACETAMINOPHEN 500 MG PO TABS
1000.0000 mg | ORAL_TABLET | Freq: Four times a day (QID) | ORAL | Status: AC
  Administered 2016-12-02 – 2016-12-07 (×16): 1000 mg via ORAL
  Filled 2016-12-02 (×16): qty 2

## 2016-12-02 MED ORDER — SODIUM CHLORIDE 0.9 % IV SOLN
INTRAVENOUS | Status: DC
  Administered 2016-12-02: 20 mL/h via INTRAVENOUS

## 2016-12-02 MED ORDER — PROPOFOL 10 MG/ML IV BOLUS
INTRAVENOUS | Status: DC | PRN
  Administered 2016-12-02: 20 mg via INTRAVENOUS
  Administered 2016-12-02: 30 mg via INTRAVENOUS
  Administered 2016-12-02: 50 mg via INTRAVENOUS

## 2016-12-02 MED ORDER — TRAMADOL HCL 50 MG PO TABS
50.0000 mg | ORAL_TABLET | ORAL | Status: DC | PRN
  Administered 2016-12-03: 50 mg via ORAL
  Filled 2016-12-02: qty 1

## 2016-12-02 MED ORDER — PROTAMINE SULFATE 10 MG/ML IV SOLN
INTRAVENOUS | Status: AC
  Filled 2016-12-02: qty 25

## 2016-12-02 MED ORDER — SODIUM CHLORIDE 0.9 % IV SOLN: 250.0000 mL | INTRAVENOUS | Status: DC

## 2016-12-02 MED ORDER — MORPHINE SULFATE (PF) 2 MG/ML IV SOLN: 1.0000 mg | INTRAVENOUS | Status: DC | PRN

## 2016-12-02 MED ORDER — SODIUM CHLORIDE 0.45 % IV SOLN
INTRAVENOUS | Status: DC | PRN
  Administered 2016-12-02: 14:00:00 via INTRAVENOUS

## 2016-12-02 MED ORDER — DEXTROSE 5 % IV SOLN
1.5000 g | Freq: Two times a day (BID) | INTRAVENOUS | Status: AC
  Administered 2016-12-02 – 2016-12-04 (×4): 1.5 g via INTRAVENOUS
  Filled 2016-12-02 (×4): qty 1.5

## 2016-12-02 MED ORDER — SODIUM CHLORIDE 0.9 % IV SOLN
INTRAVENOUS | Status: DC
  Administered 2016-12-02: 2.4 [IU]/h via INTRAVENOUS
  Filled 2016-12-02 (×2): qty 2.5

## 2016-12-02 MED FILL — Magnesium Sulfate Inj 50%: INTRAMUSCULAR | Qty: 10 | Status: AC

## 2016-12-02 MED FILL — Heparin Sodium (Porcine) Inj 1000 Unit/ML: INTRAMUSCULAR | Qty: 30 | Status: AC

## 2016-12-02 MED FILL — Potassium Chloride Inj 2 mEq/ML: INTRAVENOUS | Qty: 40 | Status: AC

## 2016-12-02 SURGICAL SUPPLY — 138 items
ADAPTER CARDIO PERF ANTE/RETRO (ADAPTER) ×3 IMPLANT
APPLICATOR COTTON TIP 6IN STRL (MISCELLANEOUS) IMPLANT
BAG DECANTER FOR FLEXI CONT (MISCELLANEOUS) ×3 IMPLANT
BENZOIN TINCTURE PRP APPL 2/3 (GAUZE/BANDAGES/DRESSINGS) ×3 IMPLANT
BLADE STERNUM SYSTEM 6 (BLADE) ×3 IMPLANT
BLADE SURG 11 STRL SS (BLADE) ×3 IMPLANT
CANISTER SUCT 3000ML PPV (MISCELLANEOUS) ×3 IMPLANT
CANN PRFSN 3/8X14X24FR PCFC (MISCELLANEOUS)
CANN PRFSN 3/8XCNCT ST RT ANG (MISCELLANEOUS)
CANNULA AORTIC ROOT 9FR (CANNULA) ×3 IMPLANT
CANNULA EZ GLIDE AORTIC 21FR (CANNULA) IMPLANT
CANNULA FEM VENOUS REMOTE 22FR (CANNULA) ×3 IMPLANT
CANNULA GUNDRY RCSP 15FR (MISCELLANEOUS) ×3 IMPLANT
CANNULA PRFSN 3/8X14X24FR PCFC (MISCELLANEOUS) IMPLANT
CANNULA PRFSN 3/8XCNCT RT ANG (MISCELLANEOUS) IMPLANT
CANNULA SOFTFLOW AORTIC 7M21FR (CANNULA) ×3 IMPLANT
CANNULA SUMP PERICARDIAL (CANNULA) ×3 IMPLANT
CANNULA VEN MTL TIP RT (MISCELLANEOUS)
CANNULA VENNOUS METAL TIP 20FR (CANNULA) ×3 IMPLANT
CATH CPB KIT OWEN (MISCELLANEOUS) IMPLANT
CATH FOLEY 2WAY SLVR  5CC 14FR (CATHETERS)
CATH FOLEY 2WAY SLVR 5CC 14FR (CATHETERS) IMPLANT
CATH HEART VENT LEFT (CATHETERS) ×4 IMPLANT
CATH THORACIC 28FR RT ANG (CATHETERS) IMPLANT
CATH THORACIC 36FR (CATHETERS) IMPLANT
CATH THORACIC 36FR RT ANG (CATHETERS) IMPLANT
CLIP FOGARTY SPRING 6M (CLIP) IMPLANT
CONN 1/2X1/2X1/2  BEN (MISCELLANEOUS)
CONN 1/2X1/2X1/2 BEN (MISCELLANEOUS) IMPLANT
CONN 3/8X1/2 ST GISH (MISCELLANEOUS) IMPLANT
CONN ST 1/4X3/8  BEN (MISCELLANEOUS) ×2
CONN ST 1/4X3/8 BEN (MISCELLANEOUS) ×4 IMPLANT
CONNECTOR 1/2X3/8X1/2 3 WAY (MISCELLANEOUS) ×1
CONNECTOR 1/2X3/8X1/2 3WAY (MISCELLANEOUS) ×2 IMPLANT
CONT SPEC 4OZ CLIKSEAL STRL BL (MISCELLANEOUS) ×6 IMPLANT
COR-KNOT ELITE COMBO KIT (Prosthesis & Implant Heart) ×3 IMPLANT
COVER PROBE W GEL 5X96 (DRAPES) ×3 IMPLANT
COVER SURGICAL LIGHT HANDLE (MISCELLANEOUS) ×6 IMPLANT
CRADLE DONUT ADULT HEAD (MISCELLANEOUS) ×3 IMPLANT
DERMABOND ADVANCED (GAUZE/BANDAGES/DRESSINGS) ×1
DERMABOND ADVANCED .7 DNX12 (GAUZE/BANDAGES/DRESSINGS) ×2 IMPLANT
DRAIN CHANNEL 32F RND 10.7 FF (WOUND CARE) ×9 IMPLANT
DRAPE BILATERAL SPLIT (DRAPES) ×3 IMPLANT
DRAPE CARDIOVASCULAR INCISE (DRAPES)
DRAPE CV SPLIT W-CLR ANES SCRN (DRAPES) IMPLANT
DRAPE INCISE IOBAN 66X45 STRL (DRAPES) ×3 IMPLANT
DRAPE SLUSH/WARMER DISC (DRAPES) ×3 IMPLANT
DRAPE SRG 135X102X78XABS (DRAPES) IMPLANT
DRSG AQUACEL AG ADV 3.5X14 (GAUZE/BANDAGES/DRESSINGS) ×3 IMPLANT
DRSG COVADERM 4X14 (GAUZE/BANDAGES/DRESSINGS) IMPLANT
ELECT REM PT RETURN 9FT ADLT (ELECTROSURGICAL) ×6
ELECTRODE REM PT RTRN 9FT ADLT (ELECTROSURGICAL) ×4 IMPLANT
FELT TEFLON 1X6 (MISCELLANEOUS) ×3 IMPLANT
GAUZE SPONGE 4X4 12PLY STRL (GAUZE/BANDAGES/DRESSINGS) ×3 IMPLANT
GLOVE BIO SURGEON STRL SZ 6 (GLOVE) IMPLANT
GLOVE BIO SURGEON STRL SZ 6.5 (GLOVE) ×24 IMPLANT
GLOVE BIO SURGEON STRL SZ7 (GLOVE) ×6 IMPLANT
GLOVE BIO SURGEON STRL SZ7.5 (GLOVE) IMPLANT
GLOVE BIOGEL PI IND STRL 6 (GLOVE) ×4 IMPLANT
GLOVE BIOGEL PI IND STRL 6.5 (GLOVE) ×12 IMPLANT
GLOVE BIOGEL PI INDICATOR 6 (GLOVE) ×2
GLOVE BIOGEL PI INDICATOR 6.5 (GLOVE) ×6
GLOVE ORTHO TXT STRL SZ7.5 (GLOVE) ×9 IMPLANT
GOWN STRL REUS W/ TWL LRG LVL3 (GOWN DISPOSABLE) ×20 IMPLANT
GOWN STRL REUS W/TWL LRG LVL3 (GOWN DISPOSABLE) ×10
HEMOSTAT POWDER SURGIFOAM 1G (HEMOSTASIS) ×15 IMPLANT
INSERT FOGARTY XLG (MISCELLANEOUS) ×3 IMPLANT
KIT BASIN OR (CUSTOM PROCEDURE TRAY) ×3 IMPLANT
KIT DILATOR VASC 18G NDL (KITS) ×3 IMPLANT
KIT DRAINAGE VACCUM ASSIST (KITS) ×3 IMPLANT
KIT ROOM TURNOVER OR (KITS) ×3 IMPLANT
KIT SUCTION CATH 14FR (SUCTIONS) ×15 IMPLANT
LEAD PACING MYOCARDI (MISCELLANEOUS) ×3 IMPLANT
LINE VENT (MISCELLANEOUS) ×3 IMPLANT
LOOP VESSEL SUPERMAXI WHITE (MISCELLANEOUS) ×3 IMPLANT
MARKER GRAFT CORONARY BYPASS (MISCELLANEOUS) IMPLANT
NS IRRIG 1000ML POUR BTL (IV SOLUTION) ×18 IMPLANT
PACK OPEN HEART (CUSTOM PROCEDURE TRAY) ×3 IMPLANT
PAD ARMBOARD 7.5X6 YLW CONV (MISCELLANEOUS) ×6 IMPLANT
POWDER SURGICEL 3.0 GRAM (HEMOSTASIS) ×3 IMPLANT
SET CARDIOPLEGIA MPS 5001102 (MISCELLANEOUS) ×3 IMPLANT
SET IRRIG TUBING LAPAROSCOPIC (IRRIGATION / IRRIGATOR) ×3 IMPLANT
SOLUTION ANTI FOG 6CC (MISCELLANEOUS) ×3 IMPLANT
SPONGE LAP 4X18 X RAY DECT (DISPOSABLE) ×6 IMPLANT
SUCKER INTRACARDIAC WEIGHTED (SUCKER) IMPLANT
SURGICEL SNOW 4INWX4INL (HEMOSTASIS) ×3 IMPLANT
SUT BONE WAX W31G (SUTURE) ×3 IMPLANT
SUT ETHIBON 2 0 V 52N 30 (SUTURE) ×6 IMPLANT
SUT ETHIBON EXCEL 2-0 V-5 (SUTURE) IMPLANT
SUT ETHIBOND 2 0 SH (SUTURE) IMPLANT
SUT ETHIBOND 2 0 SH 36X2 (SUTURE) IMPLANT
SUT ETHIBOND 2 0 V4 (SUTURE) IMPLANT
SUT ETHIBOND 2 0V4 GREEN (SUTURE) IMPLANT
SUT ETHIBOND 4 0 RB 1 (SUTURE) ×6 IMPLANT
SUT ETHIBOND 4 0 TF (SUTURE) IMPLANT
SUT ETHIBOND 5 0 C 1 30 (SUTURE) IMPLANT
SUT ETHIBOND V-5 VALVE (SUTURE) IMPLANT
SUT ETHIBOND X763 2 0 SH 1 (SUTURE) ×54 IMPLANT
SUT MNCRL AB 3-0 PS2 18 (SUTURE) ×6 IMPLANT
SUT PDS AB 1 CTX 36 (SUTURE) ×6 IMPLANT
SUT PROLENE 3 0 SH 1 (SUTURE) IMPLANT
SUT PROLENE 3 0 SH DA (SUTURE) ×12 IMPLANT
SUT PROLENE 3 0 SH1 36 (SUTURE) ×9 IMPLANT
SUT PROLENE 4 0 RB 1 (SUTURE) ×3
SUT PROLENE 4 0 SH DA (SUTURE) ×3 IMPLANT
SUT PROLENE 4-0 RB1 .5 CRCL 36 (SUTURE) ×6 IMPLANT
SUT PROLENE 5 0 C 1 36 (SUTURE) IMPLANT
SUT PROLENE 6 0 C 1 30 (SUTURE) ×3 IMPLANT
SUT SILK  1 MH (SUTURE) ×5
SUT SILK 1 MH (SUTURE) ×10 IMPLANT
SUT SILK 2 0 SH CR/8 (SUTURE) IMPLANT
SUT SILK 3 0 SH CR/8 (SUTURE) IMPLANT
SUT STEEL 6MS V (SUTURE) IMPLANT
SUT STEEL STERNAL CCS#1 18IN (SUTURE) IMPLANT
SUT STEEL SZ 6 DBL 3X14 BALL (SUTURE) IMPLANT
SUT VIC AB 1 CTX 36 (SUTURE) ×1
SUT VIC AB 1 CTX36XBRD ANBCTR (SUTURE) ×2 IMPLANT
SUT VIC AB 2-0 CTX 27 (SUTURE) IMPLANT
SUTURE E-PAK OPEN HEART (SUTURE) ×3 IMPLANT
SWAB COLLECTION DEVICE MRSA (MISCELLANEOUS) ×3 IMPLANT
SWAB CULTURE ESWAB REG 1ML (MISCELLANEOUS) ×3 IMPLANT
SYSTEM SAHARA CHEST DRAIN ATS (WOUND CARE) ×6 IMPLANT
TAPE CLOTH SURG 4X10 WHT LF (GAUZE/BANDAGES/DRESSINGS) ×3 IMPLANT
TAPE PAPER 2X10 WHT MICROPORE (GAUZE/BANDAGES/DRESSINGS) ×3 IMPLANT
TOWEL GREEN STERILE (TOWEL DISPOSABLE) ×3 IMPLANT
TOWEL GREEN STERILE FF (TOWEL DISPOSABLE) IMPLANT
TOWEL OR 17X24 6PK STRL BLUE (TOWEL DISPOSABLE) IMPLANT
TOWEL OR 17X26 10 PK STRL BLUE (TOWEL DISPOSABLE) IMPLANT
TRAY FOLEY SILVER 16FR TEMP (SET/KITS/TRAYS/PACK) ×3 IMPLANT
TUBE CONNECTING 12X1/4 (SUCTIONS) ×3 IMPLANT
TUBE SUCT INTRACARD DLP 20F (MISCELLANEOUS) ×3 IMPLANT
TUBING INSUFFLATION 10FT LAP (TUBING) IMPLANT
UNDERPAD 30X30 (UNDERPADS AND DIAPERS) ×3 IMPLANT
VALVE SYSTEM EDWS INTUITY 23A (Prosthesis & Implant Heart) ×3 IMPLANT
VALVE SYSTEM EDWS INTUITY 25A (Prosthesis & Implant Heart) ×3 IMPLANT
VENT LEFT HEART 12002 (CATHETERS) ×6
WATER STERILE IRR 1000ML POUR (IV SOLUTION) ×6 IMPLANT
YANKAUER SUCT BULB TIP NO VENT (SUCTIONS) ×3 IMPLANT

## 2016-12-02 NOTE — Progress Notes (Signed)
Rapid wean protocol attempted, but patient is unable to lift head off of the bed, and displayed tachypnea when rate was decreased.

## 2016-12-02 NOTE — Interval H&P Note (Signed)
History and Physical Interval Note:  12/02/2016 6:45 AM  James Curtis  has presented today for surgery, with the diagnosis of MV MASS  The various methods of treatment have been discussed with the patient and family. After consideration of risks, benefits and other options for treatment, the patient has consented to  Procedure(s): Resection of left atrial mass  (N/A) POSSIBLE MITRAL VALVE REPAIR (MVR) (N/A) TRANSESOPHAGEAL ECHOCARDIOGRAM (TEE) (N/A) AORTIC VALVE REPLACEMENT (AVR) (N/A) as a surgical intervention .  The patient's history has been reviewed, patient examined, no change in status, stable for surgery.  I have reviewed the patient's chart and labs.  Questions were answered to the patient's satisfaction.     Rexene Alberts

## 2016-12-02 NOTE — Procedures (Signed)
Extubation Procedure Note  Pt completed Rapid Wean without complication. ABG ok'd with MD. Pt follows all command. NIF -60, VC 1L, cuff leak +. Pt coughs/clear secretions. A&O to name/location.   Patient Details:   Name: James Curtis DOB: 12-15-1940 MRN: [id number]   Airway Documentation:     Evaluation  O2 sats: stable throughout Complications: No apparent complications Patient did tolerate procedure well. Bilateral Breath Sounds: Clear   Yes  Sharen Hint 12/02/2016, 10:26 PM

## 2016-12-02 NOTE — Progress Notes (Signed)
RT NOTE:  Rapid Wean 4/40% initiated. Pt follows commands.

## 2016-12-02 NOTE — Anesthesia Postprocedure Evaluation (Signed)
Anesthesia Post Note  Patient: James Curtis  Procedure(s) Performed: Procedure(s) (LRB): Resection of left atrial mass  (N/A) TRANSESOPHAGEAL ECHOCARDIOGRAM (TEE) (N/A) AORTIC VALVE REPLACEMENT (AVR) using a 23 Edwards Intuity Elite Aortic Valve (N/A)  Patient location during evaluation: SICU Anesthesia Type: General Level of consciousness: sedated Pain management: pain level controlled Vital Signs Assessment: post-procedure vital signs reviewed and stable Respiratory status: patient remains intubated per anesthesia plan Cardiovascular status: stable Anesthetic complications: no       Last Vitals:  Vitals:   12/02/16 0632 12/02/16 1409  BP: (!) 161/86 116/75  Pulse: (!) 59   Resp: (!) 22 12  Temp: 36.5 C     Last Pain:  Vitals:   12/02/16 6712  TempSrc: Oral                 James Curtis,W. EDMOND

## 2016-12-02 NOTE — Progress Notes (Signed)
Pt vent settings adjusted by RT; PS/CPAP. Pt currently tolerating well. Will draw ABG in 20 minutes.  Sherlie Ban, RN

## 2016-12-02 NOTE — Progress Notes (Signed)
Dr. Prescott Gum notified of pt's ABG and mechanics. Verbal orders given to extubate to mask, and give one amp bicarbonate. Will administer and continue to monitor closely.

## 2016-12-02 NOTE — Progress Notes (Signed)
RT NOTE:  CPAP/PRESSURE SUPPORT INITIATED FOR RAPID WEAN.

## 2016-12-02 NOTE — Anesthesia Procedure Notes (Signed)
Central Venous Catheter Insertion Performed by: Nolon Nations, anesthesiologist Patient location: Pre-op. Preanesthetic checklist: patient identified, IV checked, site marked, risks and benefits discussed, surgical consent, monitors and equipment checked, pre-op evaluation, timeout performed and anesthesia consent Position: Trendelenburg Lidocaine 1% used for infiltration and patient sedated Hand hygiene performed , maximum sterile barriers used  and Seldinger technique used Catheter size: 9 Fr Total catheter length 10. Central line and PA cath was placed.MAC introducer Swan type:thermodilution and oximetry PA Cath depth:47 Procedure performed using ultrasound guided technique. Ultrasound Notes:anatomy identified, needle tip was noted to be adjacent to the nerve/plexus identified, no ultrasound evidence of intravascular and/or intraneural injection and image(s) printed for medical record Attempts: 1 Following insertion, line sutured and dressing applied. Post procedure assessment: blood return through all ports, free fluid flow and no air  Patient tolerated the procedure well with no immediate complications.

## 2016-12-02 NOTE — Brief Op Note (Addendum)
12/02/2016  11:12 AM  PATIENT:  James Curtis  76 y.o. male  PRE-OPERATIVE DIAGNOSIS:  left atrial mass and aortic stenosis  POST-OPERATIVE DIAGNOSIS:  left atrial mass and aortic stenosis  PROCEDURE:  Procedure(s):  TRANSESOPHAGEAL ECHOCARDIOGRAM (TEE) (N/A)  RESECTION OF LEFT ATRIAL MASS (Dystrophic fibrotic tissue with calcification)  AORTIC VALVE REPLACEMENT (AVR) using a (N/A) -23 mm Edwards Intuity Rapid Deployment Valve  SURGEON:    Rexene Alberts, MD  ASSISTANTS:  Ellwood Handler, PA-C  ANESTHESIA:   Roderic Palau, MD  CROSSCLAMP TIME:   57'  CARDIOPULMONARY BYPASS TIME: 103'  FINDINGS:  Mobile mass in left atrium arising from thin fibrotic stalk off of posterior mitral annulus c/w benign dystrophic fibrotic tissue with calcification  Moderate aortic stenosis  Mild mitral regurgitation  Normal LV systolic function  COMPLICATIONS: None  BASELINE WEIGHT: 98.4 kg  PATIENT DISPOSITION:   TO SICU IN STABLE CONDITION  Rexene Alberts, MD 12/02/2016 1:10 PM

## 2016-12-02 NOTE — Transfer of Care (Signed)
Immediate Anesthesia Transfer of Care Note  Patient: James Curtis  Procedure(s) Performed: Procedure(s): Resection of left atrial mass  (N/A) TRANSESOPHAGEAL ECHOCARDIOGRAM (TEE) (N/A) AORTIC VALVE REPLACEMENT (AVR) using a 23 Edwards Intuity Elite Aortic Valve (N/A)  Patient Location: SICU  Anesthesia Type:General  Level of Consciousness: Patient remains intubated per anesthesia plan  Airway & Oxygen Therapy: Patient remains intubated per anesthesia plan  Post-op Assessment: Report given to RN and Post -op Vital signs reviewed and stable  Post vital signs: Reviewed and stable  Last Vitals:  Vitals:   12/02/16 0632 12/02/16 1409  BP: (!) 161/86 116/75  Pulse: (!) 59   Resp: (!) 22 12  Temp: 36.5 C     Last Pain:  Vitals:   12/02/16 0632  TempSrc: Oral      Patients Stated Pain Goal: 2 (95/18/84 1660)  Complications: No apparent anesthesia complications

## 2016-12-02 NOTE — Op Note (Signed)
CARDIOTHORACIC SURGERY OPERATIVE NOTE  Date of Procedure:  12/02/2016  Preoperative Diagnosis:   Left Atrial Mass  Moderate Aortic Stenosis   Postoperative Diagnosis: Same   Procedure:    Resection of Left Atrial Mass  Benign dystrophic fibrotic tissue with calcification, attached to posterior mitral annulus by thin fibrotic stalk   Aortic Valve Replacement  Edwards Intuity Elite rapid deployment bovine pericardial tissue valve (size 23 mm, model # 9518AC, serial # [id number])   Surgeon: Valentina Gu. Roxy Manns, MD  Assistant: Ellwood Handler, PA-C  Anesthesia: Roderic Palau, MD  Operative Findings:  Mobile mass in left atrium arising from thin fibrotic stalk off of posterior mitral annulus c/w benign dystrophic fibrotic tissue with calcification  Moderate aortic stenosis  Mild mitral regurgitation  Normal LV systolic function  Moderate coagulopathy                   BRIEF CLINICAL NOTE AND INDICATIONS FOR SURGERY  Patient is a 76 year old African-American male with known history of heart murmur, long-standing hypertension, hyperlipidemia, tobacco abuse, diet-controlled type 2 diabetes mellitus, and mild dementia who has been referred for surgical consultation to discuss treatment options for management of recently discovered small,mobile-appearing mass in the left atrium within the context of possible recent TIA. The patient has long-standing history of hypertension and a heart murmur for which he was evaluated many years ago in [place]. After his wife passed away several years ago the patient moved to [place] to live close to his son. According to the patient's son he has developed relatively mild problems with short-term memory loss and poor insight over the past year or so. He has been fully evaluated by a neurologist (Dr. Leta Baptist) on 2 previousoccasions, and diagnosed with mild dementia. Despite this, he has remained functionally independent and  reasonably active. He was last seen by his neurologist in [place] more recently he was seen by his primary care provider on 11/16/2016 for routine annual follow-up. At that time he was noted to be hypotensive and bradycardic. Coreg was discontinued. On the morning of 11/18/2016 the patient's son telephoned him and noted that the patient sounded confused, somewhat agitated, and had slurred speech. His son came to check on and promptly and noted what he thought might be right sided facial droop and slurred speech. These symptoms all seemed to resolve within 10-15 minutes. He brought the patient directly to the emergency department for evaluation. EKG at the time revealed complete heart block with heart rate of 40. He was not hypotensive. He was admitted to the hospital and evaluated by neurology and cardiology. MRI of the brain revealed no acute intracranial abnormality and stable mild chronic microvascular ischemic changes with some mild parenchymal volume loss and stable small chronic lacunar infarct in the left hemi-pons. The patient's transient neurologic symptoms were attributed to a likely TIA. Transthoracic echocardiogram was performed demonstrating vigorous left ventricular systolic function with at least mild concentric left ventricular hypertrophy. There was moderate aortic stenosis with peak velocity across the aortic valve measuring close to 4 m/s corresponding to mean transvalvular gradient estimated 23 mmHg. The DVI was reported 0.42. There was severe calcification in the mitral annulus with thickened moderately calcified leaflets. There was felt to be moderate mitral stenosis and mild mitral regurgitation. In addition there was a mobile echodensity attached to the posterior mitral annulus within the left atrium measuring greater than 1 cm in its greatest dimension, suspicious for possible vegetation. TEE was recommended. The patient underwent implantation of a  dual-chamber pacemaker on  11/21/2016 and TEE on 11/23/2016. TEE confirmed the presence of some degree of aortic stenosis with moderate thickening with restricted leaflet mobility and significant calcification and restriction of the noncoronary cusp of the aortic valve. There was mild calcification of the mitral annulus with only mildly thickened leaflets and no significant mitral stenosis. There was mild mitral regurgitation. 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 Blood cultures were obtained and cardiothoracic surgical consultation was requested.  The patient has been seen in consultation and counseled at length regarding the indications, risks and potential benefits of surgery.  All questions have been answered, and the patient provides full informed consent for the operation as described.    DETAILS OF THE OPERATIVE PROCEDURE  Preparation:  The patient is brought to the operating room on the above mentioned date and central monitoring was established by the anesthesia team including placement of Swan-Ganz catheter and radial arterial line. The patient is placed in the supine position on the operating table.  Intravenous antibiotics are administered. General endotracheal anesthesia is induced uneventfully. A Foley catheter is placed.  Baseline transesophageal echocardiogram was performed.  Findings were notable for an obvious small mobile mass in the left atrium attached to the posterior mitral annulus by a thin stalk. The mass moved freely in the left atrium but did not prolapse through the mitral valve. There was mild mitral regurgitation. There was moderate aortic stenosis. The aortic valve was trileaflet. The noncoronary leaflet of the aortic valve was severely calcified and fibrotic with severely restricted mobility.  The left and right leaflets were moderately thickened and  fibrotic with less severe restriction.  There was mild aortic insufficiency.  There was moderate left ventricular hypertrophy with normal LV systolic function.  The patient's chest, abdomen, both groins, and both lower extremities are prepared and draped in a sterile manner. A time out procedure is performed.   Surgical Approach:  A median sternotomy incision was performed and the pericardium is opened. The ascending aorta is normal in appearance.  The patient appeared somewhat coagulopathic from the beginning with significant blood loss just from sternotomy.   Extracorporeal Cardiopulmonary Bypass and Myocardial Protection:  The right common femoral vein was cannulated using the Seldinger technique with ultrasound guidance.  A guidewire was advanced under TEE guidance through the right atrium into the superior vena cava. The patient was heparinized systemically. The right common femoral vein was cannulated using a long femoral venous cannula. The ascending aorta was cannulated for cardiopulmonary bypass.  Adequate heparinization is verified.   A retrograde cardioplegia cannula is placed through the right atrium into the coronary sinus.  The operative field was continuously flooded with carbon dioxide gas.  The entire pre-bypass portion of the operation was notable for stable hemodynamics.  Cardiopulmonary bypass was begun and the surface of the heart is inspected.  A second venous cannula was placed directly into the superior vena cava.   A cardioplegia cannula is placed in the ascending aorta.  A temperature probe was placed in the interventricular septum.  The patient is cooled to 32C systemic temperature.  The aortic cross clamp is applied and cardioplegia is delivered initially in an antegrade fashion through the aortic root using modified [place] cold blood cardioplegia (KBC protocol).   The initial cardioplegic arrest is rapid with early diastolic arrest.  Approximately half of the  arresting dose  is giving antegrade and the remainder is given retrograde through the coronary sinus catheter.  Myocardial protection was felt to be excellent.   Resection of Left Atrial Mass:  A left atriotomy incision is performed posteriorly through the interatrial groove. A self-retaining retractor is utilized to expose the floor of the left atrium and the mitral valve. There is an obvious small mass in the left atrium corresponding to that seen on preoperative transesophageal echocardiogram. The mass consists of a hard calcified bowl of dystrophic fibrotic tissue attached to the base of the posterior mitral annulus by a very thin fibrotic stalk. The mass does not have an appearance suggestive of clot or old vegetation. The mass does not have the typical appearance of a papillary fibro-elastoma. Rather, the mass appears to be simply degenerative dystrophic fibrotic tissue and calcium emanating from the base of the posterior annulus of the mitral valve. The mass is resected at the base of the stalk. The mass is sent for stat Gram stain and culture, frozen section and permanent histology. Preliminary frozen section histology confirms the presence of what appears to be benign fibrotic tissue and calcification.  Gram stain reveals no white blood cells and no organisms.  The valve is inspected and noted to have moderate fibrosis throughout both leaflets and the annulus. However, both leaflets moved fairly well and there was very mild mitral regurgitation. There was no significant mitral stenosis. The left atriotomy incision is closed using a 2 layer closure of running 3-0 Prolene suture after placing a sump drain across the mitral valve to serve as a left ventricular vent.   Aortic Valve Replacement:  An oblique transverse aortotomy incision was performed.  The aortic valve was inspected and notable for moderately severe aortic stenosis.  The noncoronary leaflet of the aortic valve was severely calcified and  thickened. There was moderate calcification and fibrosis of the left and right coronary leaflets. The aortic valve leaflets were excised sharply and the aortic annulus decalcified.  Decalcification was notably straightforward.  However at the base of the noncoronary leaflet calcification extends into the annulus. After decalcification and area of the annulus is repaired using a single horizontal mattress 2-0 Ethibond suture.  The aortic annulus was sized to accept a 25 mm prosthesis.  The aortic root and left ventricle were irrigated with copious cold saline solution.    Aortic valve replacement was performed using an [employer] rapid deployment pericardial tissue valve (size 25 mm, model #8300AB, serial # [id number]).  The valve was rinsed in saline for manufacture recommendations. A total of 4 individual guiding sutures are placed through the aortic annulus, each at the corresponding nadir of each sinus of Valsalva with 2 sutures under the non-coronary sinus.  The valve was attached to the delivery device and the 3 guiding sutures placed through the valve sewing cuff. The valve is lowered into place. Care is taken to insure that the valve is completely seated in the annulus. Each of the guide sutures are secured using Cor knot clips.  The valve stent is deployed by inflating the deployment balloon to 5.0 atm pressure and holding for 10 seconds. The balloon is deflated the valve holder sutures cut In the deployment system removed.   The valve is carefully inspected to make sure that it is seated appropriately. Endoscopic visualization through the valve is utilized to inspect the left ventricular outflow tract. On careful inspection it appears the valve is not seated adequately along the annulus beneath the non-coronary sinus of  Valsalva.  The Cor-knot sutures are removed and the valve removed.  A 23 mm valve is prepared for implantation.  Aortic valve replacement was performed using an [employer] rapid deployment pericardial tissue valve (size 23 mm, model #8300AB, serial # [id number]).  The valve was rinsed in saline for manufacture recommendations. A total of 4 individual guiding sutures are placed through the aortic annulus, each at the corresponding nadir of each sinus of Valsalva with 2 sutures under the non-coronary sinus.  The valve was attached to the delivery device and the 3 guiding sutures placed through the valve sewing cuff. The valve is lowered into place. Care is taken to insure that the valve is completely seated in the annulus. Each of the guide sutures are secured using Cor knot clips.  The valve stent is deployed by inflating the deployment balloon to 4.5 atm pressure and holding for 10 seconds. The balloon is deflated the valve holder sutures cut In the deployment system removed.   The valve is carefully inspected to make sure that it is seated appropriately. Endoscopic visualization through the valve is utilized to inspect the left ventricular outflow tract. Aortic root was filled with saline to make sure the valve is competent. Rewarming is begun.   Procedure Completion:  The aortotomy was closed using a 2-layer closure of running 4-0 Prolene suture.  One final dose of warm retrograde "reanimation dose" cardioplegia was administered retrograde through the coronary sinus catheter while all air was evacuated through the aortic root.  The aortic cross clamp was removed after a total cross clamp time of 79 minutes.  Epicardial pacing wires are fixed to the right ventricular outflow tract and to the right atrial appendage. The patient is rewarmed to 37C temperature. The aortic and left ventricular vents are removed.  The patient is weaned and disconnected from cardiopulmonary bypass.  The patient's rhythm at separation from bypass was AV paced.  The patient was weaned from cardiopulmonary bypass without any inotropic support. Total cardiopulmonary bypass time for the operation was  103 minutes.  Followup transesophageal echocardiogram performed after separation from bypass revealed a well-seated aortic valve prosthesis that was functioning normally and without any sign of perivalvular leak.  Deep transgastric views are obtained from multiple angles to make certain the valve is well-seated.  Mean transvalvular gradient across the valve is estimated 6 mmHg.  Left ventricular function was unchanged from preoperatively.  The aortic and superior vena cava cannula were removed uneventfully. Protamine was administered to reverse the anticoagulation. The femoral venous cannula was removed and manual pressure held on the right groin for 45 minutes.  The mediastinum and pleural space were inspected for hemostasis and irrigated with saline solution.  There was moderate coagulopathy.  An exhaustive search for signs of mechanical bleeding was completed and the mediastinum intermittently packed with guaze.  The patient received a total of 2 packs adult platelets and 2 units fresh frozen plasma due to coagulopathy and thrombocytopenia after separation from cardiopulmonary bypass and reversal of heparin with protamine.  The mediastinum and both pleural spaces were drained using 4 chest tubes placed through separate stab incisions inferiorly.  The soft tissues anterior to the aorta were reapproximated loosely. The sternum is closed with double strength sternal wire. The soft tissues anterior to the sternum were closed in multiple layers and the skin is closed with a running subcuticular skin closure.  The post-bypass portion of the operation was notable for stable rhythm and hemodynamics.  The patient received  2 units packed red blood cells during the procedure due to anemia which was present preoperatively and exacerbated by acute blood loss and hemodilution during cardiopulmonary bypass.   Disposition:  The patient tolerated the procedure well and is transported to the surgical intensive care  in stable condition. There are no intraoperative complications. All sponge instrument and needle counts are verified correct at completion of the operation.    Valentina Gu. Roxy Manns MD 12/02/2016 1:17 PM

## 2016-12-02 NOTE — Progress Notes (Signed)
Rapid wean protocol initiated by RT and this RN. Will closely monitor pt.  Sherlie Ban, RN

## 2016-12-02 NOTE — Progress Notes (Signed)
  Echocardiogram Echocardiogram Transesophageal has been performed.  James Curtis 12/02/2016, 9:00 AM

## 2016-12-02 NOTE — Anesthesia Procedure Notes (Signed)
Procedure Name: Intubation Date/Time: 12/02/2016 8:06 AM Performed by: Clearnce Sorrel Pre-anesthesia Checklist: Patient identified, Emergency Drugs available, Suction available, Patient being monitored and Timeout performed Patient Re-evaluated:Patient Re-evaluated prior to inductionOxygen Delivery Method: Circle system utilized Preoxygenation: Pre-oxygenation with 100% oxygen Intubation Type: IV induction Ventilation: Oral airway inserted - appropriate to patient size and Two handed mask ventilation required Laryngoscope Size: Miller and 1 Grade View: Grade II Tube type: Oral Tube size: 7.5 mm Number of attempts: 1 Placement Confirmation: ETT inserted through vocal cords under direct vision,  positive ETCO2 and breath sounds checked- equal and bilateral Secured at: 23 cm Tube secured with: Tape Dental Injury: Teeth and Oropharynx as per pre-operative assessment

## 2016-12-02 NOTE — Progress Notes (Signed)
  CT CT surgery p.m. Rounds   Stable after AVR CI 2.2 Paced rhythm Tube drainage improved Labs reviewed

## 2016-12-03 ENCOUNTER — Inpatient Hospital Stay (HOSPITAL_COMMUNITY): Payer: Medicare Other

## 2016-12-03 LAB — GLUCOSE, CAPILLARY
GLUCOSE-CAPILLARY: 133 mg/dL — AB (ref 65–99)
GLUCOSE-CAPILLARY: 133 mg/dL — AB (ref 65–99)
GLUCOSE-CAPILLARY: 125 mg/dL — AB (ref 65–99)
GLUCOSE-CAPILLARY: 118 mg/dL — AB (ref 65–99)
GLUCOSE-CAPILLARY: 112 mg/dL — AB (ref 65–99)
GLUCOSE-CAPILLARY: 146 mg/dL — AB (ref 65–99)
GLUCOSE-CAPILLARY: 164 mg/dL — AB (ref 65–99)
Glucose-Capillary: 101 mg/dL — ABNORMAL HIGH (ref 65–99)
Glucose-Capillary: 103 mg/dL — ABNORMAL HIGH (ref 65–99)
Glucose-Capillary: 115 mg/dL — ABNORMAL HIGH (ref 65–99)
Glucose-Capillary: 119 mg/dL — ABNORMAL HIGH (ref 65–99)
Glucose-Capillary: 129 mg/dL — ABNORMAL HIGH (ref 65–99)
Glucose-Capillary: 143 mg/dL — ABNORMAL HIGH (ref 65–99)
Glucose-Capillary: 158 mg/dL — ABNORMAL HIGH (ref 65–99)

## 2016-12-03 LAB — CREATININE, SERUM
Creatinine, Ser: 1.64 mg/dL — ABNORMAL HIGH (ref 0.61–1.24)
GFR, EST AFRICAN AMERICAN: 45 mL/min — AB (ref >60)
GFR, EST NON AFRICAN AMERICAN: 39 mL/min — AB (ref >60)

## 2016-12-03 LAB — CBC
HCT: 27.9 % — ABNORMAL LOW (ref 39.0–52.0)
HEMATOCRIT: 26.6 % — AB (ref 39.0–52.0)
HEMOGLOBIN: 9.5 g/dL — AB (ref 13.0–17.0)
Hemoglobin: 9.2 g/dL — ABNORMAL LOW (ref 13.0–17.0)
MCH: 29 pg (ref 26.0–34.0)
MCH: 29.9 pg (ref 26.0–34.0)
MCHC: 34.1 g/dL (ref 30.0–36.0)
MCHC: 34.6 g/dL (ref 30.0–36.0)
MCV: 85.1 fL (ref 78.0–100.0)
MCV: 86.4 fL (ref 78.0–100.0)
Platelets: 161 10*3/uL (ref 150–400)
Platelets: 128 10*3/uL — ABNORMAL LOW (ref 150–400)
RBC: 3.28 MIL/uL — AB (ref 4.22–5.81)
RBC: 3.08 MIL/uL — ABNORMAL LOW (ref 4.22–5.81)
RDW: 14.8 % (ref 11.5–15.5)
RDW: 15.4 % (ref 11.5–15.5)
WBC: 16.5 10*3/uL — AB (ref 4.0–10.5)
WBC: 16.9 10*3/uL — ABNORMAL HIGH (ref 4.0–10.5)

## 2016-12-03 LAB — BASIC METABOLIC PANEL
Anion gap: 9 (ref 5–15)
BUN: 14 mg/dL (ref 6–20)
CALCIUM: 8.1 mg/dL — AB (ref 8.9–10.3)
CO2: 23 mmol/L (ref 22–32)
CREATININE: 1.38 mg/dL — AB (ref 0.61–1.24)
Chloride: 110 mmol/L (ref 101–111)
GFR calc non Af Amer: 48 mL/min — ABNORMAL LOW (ref >60)
GFR, EST AFRICAN AMERICAN: 56 mL/min — AB (ref >60)
Glucose, Bld: 126 mg/dL — ABNORMAL HIGH (ref 65–99)
Potassium: 3.8 mmol/L (ref 3.5–5.1)
SODIUM: 142 mmol/L (ref 135–145)

## 2016-12-03 LAB — BPAM PLATELET PHERESIS
Blood Product Expiration Date: 201804202359
Blood Product Expiration Date: 201804212359
ISSUE DATE / TIME: 201804201231
ISSUE DATE / TIME: 201804201231
UNIT TYPE AND RH: 6200
Unit Type and Rh: 9500

## 2016-12-03 LAB — BPAM FFP
BLOOD PRODUCT EXPIRATION DATE: 201804252359
BLOOD PRODUCT EXPIRATION DATE: 201804252359
ISSUE DATE / TIME: 201804201307
ISSUE DATE / TIME: 201804201300
UNIT TYPE AND RH: 7300
Unit Type and Rh: 7300

## 2016-12-03 LAB — PREPARE PLATELET PHERESIS
UNIT DIVISION: 0
Unit division: 0

## 2016-12-03 LAB — PREPARE FRESH FROZEN PLASMA
Unit division: 0
Unit division: 0

## 2016-12-03 LAB — POCT I-STAT, CHEM 8
BUN: 20 mg/dL (ref 6–20)
CALCIUM ION: 1.24 mmol/L (ref 1.15–1.40)
Chloride: 105 mmol/L (ref 101–111)
Creatinine, Ser: 1.4 mg/dL — ABNORMAL HIGH (ref 0.61–1.24)
GLUCOSE: 163 mg/dL — AB (ref 65–99)
HCT: 25 % — ABNORMAL LOW (ref 39.0–52.0)
HEMOGLOBIN: 8.5 g/dL — AB (ref 13.0–17.0)
POTASSIUM: 4.3 mmol/L (ref 3.5–5.1)
Sodium: 143 mmol/L (ref 135–145)
TCO2: 24 mmol/L (ref 0–100)

## 2016-12-03 LAB — MAGNESIUM
MAGNESIUM: 2.5 mg/dL — AB (ref 1.7–2.4)
Magnesium: 2.9 mg/dL — ABNORMAL HIGH (ref 1.7–2.4)

## 2016-12-03 MED ORDER — INSULIN ASPART 100 UNIT/ML ~~LOC~~ SOLN
0.0000 [IU] | SUBCUTANEOUS | Status: DC
  Administered 2016-12-03: 2 [IU] via SUBCUTANEOUS
  Administered 2016-12-03: 4 [IU] via SUBCUTANEOUS
  Administered 2016-12-03 – 2016-12-04 (×2): 2 [IU] via SUBCUTANEOUS

## 2016-12-03 MED ORDER — INSULIN DETEMIR 100 UNIT/ML ~~LOC~~ SOLN
12.0000 [IU] | Freq: Two times a day (BID) | SUBCUTANEOUS | Status: DC
  Administered 2016-12-03 – 2016-12-04 (×4): 12 [IU] via SUBCUTANEOUS
  Filled 2016-12-03 (×5): qty 0.12

## 2016-12-03 MED ORDER — FUROSEMIDE 10 MG/ML IJ SOLN
20.0000 mg | Freq: Four times a day (QID) | INTRAMUSCULAR | Status: DC
  Administered 2016-12-03 – 2016-12-04 (×4): 20 mg via INTRAVENOUS
  Filled 2016-12-03 (×4): qty 2

## 2016-12-03 NOTE — Progress Notes (Signed)
Pt Swan-Ganz core temperature reading low; checked against oral temp. Pt oral temp 97.7 F (36.5 C) compared to 96.4 F (35.8 C) via Swan. Will continue to monitor.  Sherlie Ban, RN

## 2016-12-03 NOTE — Progress Notes (Signed)
1 Day Post-Op Procedure(s) (LRB): Resection of left atrial mass  (N/A) TRANSESOPHAGEAL ECHOCARDIOGRAM (TEE) (N/A) AORTIC VALVE REPLACEMENT (AVR) using a 23 Edwards Intuity Elite Aortic Valve (N/A) Subjective: extubated , neuro intact Hemodynamics stable on 2.5 dopamine CXR wet - lasix ordered Paced rhythm   Objective: Vital signs in last 24 hours: Temp:  [96.3 F (35.7 C)-99 F (37.2 C)] 97.7 F (36.5 C) (04/21 0933) Pulse Rate:  [58-108] 94 (04/21 0933) Cardiac Rhythm: A-V Sequential paced (04/21 0800) Resp:  [11-30] 14 (04/21 0933) BP: (85-147)/(55-90) 130/90 (04/21 0933) SpO2:  [91 %-100 %] 94 % (04/21 0933) FiO2 (%):  [40 %-55 %] 55 % (04/21 0000) Weight:  [224 lb 10.4 oz (101.9 kg)] 224 lb 10.4 oz (101.9 kg) (04/21 0500)  Hemodynamic parameters for last 24 hours: PAP: (23-44)/(6-25) 39/24 CO:  [3.4 L/min-6.1 L/min] 5.5 L/min CI:  [1.6 L/min/m2-2.9 L/min/m2] 2.6 L/min/m2  Intake/Output from previous day: 04/20 0701 - 04/21 0700 In: 8671.2 [I.V.:4946.2; GDJME:2683; IV Piggyback:1350] Out: 4196 [Urine:2185; Blood:325; Chest Tube:1350] Intake/Output this shift: Total I/O In: 203.3 [P.O.:120; I.V.:83.3] Out: 275 [Urine:255; Chest Tube:20]       Exam    General- alert and comfortable   Lungs- clear without rales, wheezes   Cor- regular rate and rhythm, no murmur , gallop   Abdomen- soft, non-tender   Extremities - warm, non-tender, minimal edema   Neuro- oriented, appropriate, no focal weakness   Lab Results:  Recent Labs  12/02/16 1800 12/02/16 1815 12/03/16 0400  WBC 11.8*  --  16.5*  HGB 9.9* 9.2* 9.5*  HCT 28.2* 27.0* 27.9*  PLT 140*  --  161   BMET:  Recent Labs  12/02/16 0617  12/02/16 1815 12/03/16 0400  NA 141  < > 143 142  K 4.4  < > 4.8 3.8  CL 104  < > 107 110  CO2 23  --   --  23  GLUCOSE 115*  < > 142* 126*  BUN 19  < > 18 14  CREATININE 1.44*  < > 1.20 1.38*  CALCIUM 9.8  --   --  8.1*  < > = values in this interval not  displayed.  PT/INR:  Recent Labs  12/02/16 1412  LABPROT 18.6*  INR 1.53   ABG    Component Value Date/Time   PHART 7.313 (L) 12/02/2016 2316   HCO3 23.5 12/02/2016 2316   TCO2 25 12/02/2016 2316   ACIDBASEDEF 3.0 (H) 12/02/2016 2316   O2SAT 91.0 12/02/2016 2316   CBG (last 3)   Recent Labs  12/03/16 0604 12/03/16 0653 12/03/16 0759  GLUCAP 115* 101* 103*    Assessment/Plan: S/P Procedure(s) (LRB): Resection of left atrial mass  (N/A) TRANSESOPHAGEAL ECHOCARDIOGRAM (TEE) (N/A) AORTIC VALVE REPLACEMENT (AVR) using a 23 Edwards Intuity Elite Aortic Valve (N/A) Mobilize Diuresis Diabetes control d/c tubes/lines See progression orders follow creat 1.35   LOS: 1 day    Tharon Aquas Trigt III 12/03/2016

## 2016-12-03 NOTE — Progress Notes (Signed)
CT Surgery PM Rounds  OOB to chair Paced rhythm Pm labs ok Foley came out by accident- low urine output since then will replace- discussed need to monitor urine output with patient

## 2016-12-03 NOTE — Progress Notes (Signed)
Patient had pulled Foley catheter and it became dislodged. A large blood clot was noted inside catheter's lumen. After attempting to advance catheter back into place without success, the foley was removed. Patient has had minor active bleeding. Bladder scan showed 0 mL of urine. Will bladder scan again in 4 hours if patient has not voided and will continue to monitor patient closely.   James Curtis. Lovena Le RN

## 2016-12-04 ENCOUNTER — Inpatient Hospital Stay (HOSPITAL_COMMUNITY): Payer: Medicare Other

## 2016-12-04 ENCOUNTER — Encounter (HOSPITAL_COMMUNITY): Payer: Self-pay | Admitting: Thoracic Surgery (Cardiothoracic Vascular Surgery)

## 2016-12-04 LAB — BASIC METABOLIC PANEL
Anion gap: 9 (ref 5–15)
Anion gap: 5 (ref 5–15)
BUN: 19 mg/dL (ref 6–20)
BUN: 23 mg/dL — ABNORMAL HIGH (ref 6–20)
CO2: 25 mmol/L (ref 22–32)
CO2: 25 mmol/L (ref 22–32)
Calcium: 8.7 mg/dL — ABNORMAL LOW (ref 8.9–10.3)
Calcium: 8.6 mg/dL — ABNORMAL LOW (ref 8.9–10.3)
Chloride: 106 mmol/L (ref 101–111)
Chloride: 110 mmol/L (ref 101–111)
Creatinine, Ser: 1.64 mg/dL — ABNORMAL HIGH (ref 0.61–1.24)
Creatinine, Ser: 1.86 mg/dL — ABNORMAL HIGH (ref 0.61–1.24)
GFR calc Af Amer: 45 mL/min — ABNORMAL LOW (ref >60)
GFR calc Af Amer: 39 mL/min — ABNORMAL LOW (ref >60)
GFR calc non Af Amer: 39 mL/min — ABNORMAL LOW (ref >60)
GFR calc non Af Amer: 34 mL/min — ABNORMAL LOW (ref >60)
Glucose, Bld: 120 mg/dL — ABNORMAL HIGH (ref 65–99)
Glucose, Bld: 142 mg/dL — ABNORMAL HIGH (ref 65–99)
Potassium: 4.2 mmol/L (ref 3.5–5.1)
Potassium: 4.1 mmol/L (ref 3.5–5.1)
Sodium: 140 mmol/L (ref 135–145)
Sodium: 140 mmol/L (ref 135–145)

## 2016-12-04 LAB — CBC
HCT: 24.9 % — ABNORMAL LOW (ref 39.0–52.0)
Hemoglobin: 8.5 g/dL — ABNORMAL LOW (ref 13.0–17.0)
MCH: 29.4 pg (ref 26.0–34.0)
MCHC: 34.1 g/dL (ref 30.0–36.0)
MCV: 86.2 fL (ref 78.0–100.0)
Platelets: 127 10*3/uL — ABNORMAL LOW (ref 150–400)
RBC: 2.89 MIL/uL — ABNORMAL LOW (ref 4.22–5.81)
RDW: 15.1 % (ref 11.5–15.5)
WBC: 16.4 10*3/uL — ABNORMAL HIGH (ref 4.0–10.5)

## 2016-12-04 LAB — GLUCOSE, CAPILLARY
GLUCOSE-CAPILLARY: 110 mg/dL — AB (ref 65–99)
GLUCOSE-CAPILLARY: 115 mg/dL — AB (ref 65–99)
GLUCOSE-CAPILLARY: 138 mg/dL — AB (ref 65–99)
Glucose-Capillary: 95 mg/dL (ref 65–99)
Glucose-Capillary: 112 mg/dL — ABNORMAL HIGH (ref 65–99)

## 2016-12-04 MED ORDER — TRAMADOL HCL 50 MG PO TABS: 50.0000 mg | ORAL_TABLET | Freq: Four times a day (QID) | ORAL | Status: DC | PRN

## 2016-12-04 MED ORDER — FUROSEMIDE 10 MG/ML IJ SOLN
40.0000 mg | Freq: Every day | INTRAMUSCULAR | Status: DC
  Administered 2016-12-04: 40 mg via INTRAVENOUS
  Filled 2016-12-04: qty 4

## 2016-12-04 MED ORDER — HALOPERIDOL LACTATE 5 MG/ML IJ SOLN: 4.0000 mg | Freq: Four times a day (QID) | INTRAMUSCULAR | Status: DC | PRN

## 2016-12-04 NOTE — Evaluation (Addendum)
Physical Therapy Evaluation Patient Details Name: James Curtis MRN: [id number] DOB: 11-Nov-1940 Today's Date: 12/04/2016   History of Present Illness  76 year old African-American male with known history of heart murmur, long-standing hypertension, hyperlipidemia, tobacco abuse, diet-controlled type 2 diabetes mellitus, and mild dementia who presented with left atrial mass and is now s/p TRANSESOPHAGEAL ECHOCARDIOGRAM (TEE) AORTIC VALVE REPLACEMENT.  Clinical Impression  Patient demonstrates deficits in functional mobility as indicated below. Will need continued skilled PT to address deficits and maximize function. Will see as indicated and progress as tolerated. At this time, patient limited by cognition, safety awareness, and impulsivity. Feel patient will progress well with activity. Patient will need HHPT and 24/7 supervision upon acute discharge, if family unable to provide, then may need to consider post acute rehabilitation.   OF NOTE: patient ambulated on room air with saturations dropping to upper 80s, nsg aware.     Follow Up Recommendations Home health PT;Supervision/Assistance - 24 hour (if family unable to provide 24/7 may need to consider post acute rehab)    Equipment Recommendations   (TBD)    Recommendations for Other Services       Precautions / Restrictions Precautions Precautions: Sternal Restrictions Weight Bearing Restrictions: Yes (Sternal precautions)      Mobility  Bed Mobility               General bed mobility comments: Pt OOB in chair upon arrival.  Transfers Overall transfer level: Needs assistance Equipment used:  (eva walker) Transfers: Sit to/from Stand Sit to Stand: Min assist         General transfer comment: VCs for technique, min assist for safety due to impulsivity  Ambulation/Gait Ambulation/Gait assistance: Min assist Ambulation Distance (Feet): 190 Feet Assistive device:  (eva walker) Gait Pattern/deviations: Step-through  pattern Gait velocity: decreased Gait velocity interpretation: Below normal speed for age/gender General Gait Details: steady with ambulation but difficulty steering assistive device during mobility.  Stairs            Wheelchair Mobility    Modified Rankin (Stroke Patients Only)       Balance Overall balance assessment: Needs assistance Sitting-balance support: Feet supported Sitting balance-Leahy Scale: Fair Sitting balance - Comments: able to sit without assist   Standing balance support: Bilateral upper extremity supported Standing balance-Leahy Scale: Poor Standing balance comment: noted instability in static standing, min guard to min assist for dynamic activity                             Pertinent Vitals/Pain      Home Living Family/patient expects to be discharged to:: Private residence Living Arrangements: Alone Available Help at Discharge: Family;Available PRN/intermittently Type of Home: House Home Access: Level entry     Home Layout: One level Home Equipment: None;Grab bars - tub/shower      Prior Function Level of Independence: Independent               Hand Dominance   Dominant Hand: Right    Extremity/Trunk Assessment   Upper Extremity Assessment Upper Extremity Assessment: Defer to OT evaluation    Lower Extremity Assessment Lower Extremity Assessment: Generalized weakness    Cervical / Trunk Assessment Cervical / Trunk Assessment: Normal  Communication      Cognition Arousal/Alertness: Awake/alert Behavior During Therapy: Impulsive Overall Cognitive Status: Impaired/Different from baseline Area of Impairment: Orientation;Memory;Safety/judgement;Awareness;Problem solving  Orientation Level: Disoriented to;Place;Time   Memory: Decreased recall of precautions;Decreased short-term memory   Safety/Judgement: Decreased awareness of safety;Decreased awareness of deficits Awareness:  Intellectual Problem Solving: Slow processing;Requires verbal cues;Requires tactile cues General Comments: Patient very impuslive max cues for safety during session. Patient confused throughout session.      General Comments      Exercises     Assessment/Plan    PT Assessment Patient needs continued PT services  PT Problem List Decreased strength;Decreased activity tolerance;Decreased balance;Decreased mobility;Decreased cognition;Decreased safety awareness;Cardiopulmonary status limiting activity;Pain       PT Treatment Interventions DME instruction;Gait training;Stair training;Functional mobility training;Therapeutic activities;Therapeutic exercise;Balance training;Patient/family education    PT Goals (Current goals can be found in the Care Plan section)  Acute Rehab PT Goals Patient Stated Goal: to go home PT Goal Formulation: With patient Time For Goal Achievement: 12/18/16 Potential to Achieve Goals: Good    Frequency Min 3X/week   Barriers to discharge Decreased caregiver support      Co-evaluation               End of Session   Activity Tolerance: Patient tolerated treatment well (desaturated to upper 80s on room air) Patient left: in chair;with call bell/phone within reach;with chair alarm set Nurse Communication: Mobility status PT Visit Diagnosis: Unsteadiness on feet (R26.81)    Time: 7035-0093 PT Time Calculation (min) (ACUTE ONLY): 24 min   Charges:   PT Evaluation $PT Eval Moderate Complexity: 1 Procedure PT Treatments $Gait Training: 8-22 mins   PT G Codes:        Alben Deeds, PT DPT  [phone]   Duncan Dull 12/04/2016, 2:31 PM

## 2016-12-04 NOTE — Progress Notes (Signed)
CT Surgery  Called that patient had pulled out 3 of 4 chest tubes completely out CXR at 0300 clear with one tube in place Patient had pulled urinary catheter out previously Patient observed trying to get out of bed unassisted  I have ordered 1:1 patient nursing staffing for patient safety to prevent fall or other unintended self injury from medical equipment

## 2016-12-04 NOTE — Progress Notes (Signed)
Observed pt becoming restless; pt attempting to get out of bed to urinate; pt has external condom catheter in place. Attempted to reorient patient and reinforce sternal precautions. Pt stated having pain 5/10. Went to get pain medicine for pt. On way back to room, heard bed alarm going off. Pt was sitting on side of bed by himself; attempting to stand. Reoriented pt to surroundings and reminded pt that it was not safe to stand by himself. Pt condom catheter had come off by this point and some urine had leaked onto the bedsheets. Cleaned pt, changed sheets, and replaced condom catheter. Will continue to monitor and reorient patient as needed.  Sherlie Ban, RN

## 2016-12-04 NOTE — Progress Notes (Signed)
Pt pulled out 3 of 4 chest tubes; stat chest Xray ordered. Sites covered with vaseline gauze; breath sounds clear diminished throughout. MD on call made aware. Multiple attempts had been made throughout shift to reorient pt. Educated pt safety concerns and pt verbalized understanding at times. Pt forgetful.  No further orders received from MD. Will continue to closely monitor pt.

## 2016-12-04 NOTE — Progress Notes (Signed)
2 Days Post-Op Procedure(s) (LRB): Resection of left atrial mass  (N/A) TRANSESOPHAGEAL ECHOCARDIOGRAM (TEE) (N/A) AORTIC VALVE REPLACEMENT (AVR) using a 23 Edwards Intuity Elite Aortic Valve (N/A) Subjective: Eating breakfast, no complaints Conversant but not oriented to place, understands why he is in hospital Foley replaced - clear yellow drainage Objective: Vital signs in last 24 hours: Temp:  [96.7 F (35.9 C)-98.6 F (37 C)] 97.8 F (36.6 C) (04/22 0400) Pulse Rate:  [64-94] 84 (04/22 0900) Cardiac Rhythm: A-V Sequential paced (04/22 0739) Resp:  [13-23] 16 (04/22 0900) BP: (109-151)/(56-95) 150/83 (04/22 0900) SpO2:  [91 %-100 %] 97 % (04/22 0900) Weight:  [223 lb 15.8 oz (101.6 kg)] 223 lb 15.8 oz (101.6 kg) (04/22 0500)  Hemodynamic parameters for last 24 hours:   paced rhythm Intake/Output from previous day: 04/21 0701 - 04/22 0700 In: 1396.3 [P.O.:840; I.V.:456.3; IV Piggyback:100] Out: 3664 [Urine:1005; Chest Tube:440] Intake/Output this shift: Total I/O In: 49.2 [I.V.:49.2] Out: -        Exam    General- alert and comfortable   Lungs- clear without rales, wheezes   Cor- regular rate and rhythm, no murmur , gallop   Abdomen- soft, non-tender   Extremities - warm, non-tender, minimal edema   Neuro- oriented, appropriate, no focal weakness   Lab Results:  Recent Labs  12/03/16 1651 12/04/16 0345  WBC 16.9* 16.4*  HGB 9.2* 8.5*  HCT 26.6* 24.9*  PLT 128* 127*   BMET:  Recent Labs  12/03/16 0400 12/03/16 1650 12/03/16 1651 12/04/16 0345  NA 142 143  --  140  K 3.8 4.3  --  4.2  CL 110 105  --  106  CO2 23  --   --  25  GLUCOSE 126* 163*  --  120*  BUN 14 20  --  19  CREATININE 1.38* 1.40* 1.64* 1.64*  CALCIUM 8.1*  --   --  8.7*    PT/INR:  Recent Labs  12/02/16 1412  LABPROT 18.6*  INR 1.53   ABG    Component Value Date/Time   PHART 7.313 (L) 12/02/2016 2316   HCO3 23.5 12/02/2016 2316   TCO2 24 12/03/2016 1650   ACIDBASEDEF  3.0 (H) 12/02/2016 2316   O2SAT 91.0 12/02/2016 2316   CBG (last 3)   Recent Labs  12/03/16 2339 12/04/16 0336 12/04/16 0852  GLUCAP 129* 110* 95    Assessment/Plan: S/P Procedure(s) (LRB): Resection of left atrial mass  (N/A) TRANSESOPHAGEAL ECHOCARDIOGRAM (TEE) (N/A) AORTIC VALVE REPLACEMENT (AVR) using a 23 Edwards Intuity Elite Aortic Valve (N/A) Mobilize Diuresis Diabetes control wean off renal dopamine- creat at baseline 1.6 [1.4]   LOS: 2 days    Tharon Aquas Trigt III 12/04/2016

## 2016-12-04 NOTE — Progress Notes (Signed)
CT surgery p.m. Rounds  Patient was ambulating in the hallway with assistance Final chest tube removed No narcotics given today Patient remains slightly agitated but conversant and responsive P.m. labs show creatinine slowly increased 1.8 Blood pressure 527 systolic so will not restart dopamine Patient with mitten. restraints and close observation by nursing

## 2016-12-04 NOTE — Progress Notes (Signed)
16 Fr foley placed successfully by this RN and Cipriano Mile, RN. Sterile technique maintained, pt tolerated well.  Sherlie Ban, RN

## 2016-12-05 ENCOUNTER — Inpatient Hospital Stay (HOSPITAL_COMMUNITY): Payer: Medicare Other

## 2016-12-05 LAB — COMPREHENSIVE METABOLIC PANEL
ALT: 18 U/L (ref 17–63)
AST: 27 U/L (ref 15–41)
Albumin: 2.9 g/dL — ABNORMAL LOW (ref 3.5–5.0)
Alkaline Phosphatase: 47 U/L (ref 38–126)
Anion gap: 6 (ref 5–15)
BUN: 24 mg/dL — ABNORMAL HIGH (ref 6–20)
CO2: 25 mmol/L (ref 22–32)
Calcium: 8.5 mg/dL — ABNORMAL LOW (ref 8.9–10.3)
Chloride: 110 mmol/L (ref 101–111)
Creatinine, Ser: 1.62 mg/dL — ABNORMAL HIGH (ref 0.61–1.24)
GFR calc Af Amer: 46 mL/min — ABNORMAL LOW (ref >60)
GFR calc non Af Amer: 40 mL/min — ABNORMAL LOW (ref >60)
Glucose, Bld: 79 mg/dL (ref 65–99)
Potassium: 3.6 mmol/L (ref 3.5–5.1)
Sodium: 141 mmol/L (ref 135–145)
Total Bilirubin: 0.6 mg/dL (ref 0.3–1.2)
Total Protein: 5 g/dL — ABNORMAL LOW (ref 6.5–8.1)

## 2016-12-05 LAB — CBC
HCT: 23.1 % — ABNORMAL LOW (ref 39.0–52.0)
Hemoglobin: 7.9 g/dL — ABNORMAL LOW (ref 13.0–17.0)
MCH: 29.5 pg (ref 26.0–34.0)
MCHC: 34.2 g/dL (ref 30.0–36.0)
MCV: 86.2 fL (ref 78.0–100.0)
Platelets: 125 10*3/uL — ABNORMAL LOW (ref 150–400)
RBC: 2.68 MIL/uL — ABNORMAL LOW (ref 4.22–5.81)
RDW: 15 % (ref 11.5–15.5)
WBC: 12.4 10*3/uL — ABNORMAL HIGH (ref 4.0–10.5)

## 2016-12-05 LAB — GLUCOSE, CAPILLARY
GLUCOSE-CAPILLARY: 66 mg/dL (ref 65–99)
GLUCOSE-CAPILLARY: 75 mg/dL (ref 65–99)
GLUCOSE-CAPILLARY: 97 mg/dL (ref 65–99)
Glucose-Capillary: 74 mg/dL (ref 65–99)

## 2016-12-05 MED ORDER — METOPROLOL TARTRATE 25 MG PO TABS
25.0000 mg | ORAL_TABLET | Freq: Two times a day (BID) | ORAL | Status: DC
  Administered 2016-12-05 – 2016-12-09 (×9): 25 mg via ORAL
  Filled 2016-12-05 (×9): qty 1

## 2016-12-05 MED ORDER — APOAEQUORIN 10 MG PO CAPS: ORAL_CAPSULE | Freq: Every day | ORAL | Status: DC

## 2016-12-05 MED ORDER — FA-PYRIDOXINE-CYANOCOBALAMIN 2.5-25-2 MG PO TABS
1.0000 | ORAL_TABLET | Freq: Every day | ORAL | Status: DC
  Administered 2016-12-05 – 2016-12-09 (×5): 1 via ORAL
  Filled 2016-12-05 (×5): qty 1

## 2016-12-05 MED ORDER — POTASSIUM CHLORIDE CRYS ER 20 MEQ PO TBCR: 40.0000 meq | EXTENDED_RELEASE_TABLET | ORAL | Status: DC

## 2016-12-05 MED ORDER — POTASSIUM CHLORIDE 20 MEQ/15ML (10%) PO SOLN: 20.0000 meq | Freq: Four times a day (QID) | ORAL | Status: DC

## 2016-12-05 MED ORDER — FUROSEMIDE 10 MG/ML IJ SOLN
40.0000 mg | Freq: Once | INTRAMUSCULAR | Status: AC
  Administered 2016-12-05: 40 mg via INTRAVENOUS
  Filled 2016-12-05: qty 4

## 2016-12-05 MED ORDER — SIMVASTATIN 20 MG PO TABS
20.0000 mg | ORAL_TABLET | Freq: Every day | ORAL | Status: DC
  Administered 2016-12-05 – 2016-12-08 (×4): 20 mg via ORAL
  Filled 2016-12-05 (×4): qty 1

## 2016-12-05 MED ORDER — ASPIRIN EC 325 MG PO TBEC
325.0000 mg | DELAYED_RELEASE_TABLET | Freq: Every day | ORAL | Status: DC
  Administered 2016-12-05 – 2016-12-09 (×5): 325 mg via ORAL
  Filled 2016-12-05 (×5): qty 1

## 2016-12-05 MED ORDER — SODIUM CHLORIDE 0.9% FLUSH: 3.0000 mL | INTRAVENOUS | Status: DC | PRN

## 2016-12-05 MED ORDER — POLYSACCHARIDE IRON COMPLEX 150 MG PO CAPS
150.0000 mg | ORAL_CAPSULE | Freq: Every day | ORAL | Status: DC
  Administered 2016-12-05 – 2016-12-09 (×5): 150 mg via ORAL
  Filled 2016-12-05 (×6): qty 1

## 2016-12-05 MED ORDER — SODIUM CHLORIDE 0.9 % IV SOLN: 250.0000 mL | INTRAVENOUS | Status: DC | PRN

## 2016-12-05 MED ORDER — VITAMIN D 1000 UNITS PO TABS
1000.0000 [IU] | ORAL_TABLET | Freq: Every day | ORAL | Status: DC
  Administered 2016-12-05 – 2016-12-09 (×5): 1000 [IU] via ORAL
  Filled 2016-12-05 (×5): qty 1

## 2016-12-05 MED ORDER — SODIUM CHLORIDE 0.9% FLUSH
3.0000 mL | Freq: Two times a day (BID) | INTRAVENOUS | Status: DC
  Administered 2016-12-05 – 2016-12-08 (×9): 3 mL via INTRAVENOUS

## 2016-12-05 MED ORDER — POTASSIUM CHLORIDE CRYS ER 20 MEQ PO TBCR
20.0000 meq | EXTENDED_RELEASE_TABLET | Freq: Four times a day (QID) | ORAL | Status: DC
  Administered 2016-12-05: 20 meq via ORAL
  Filled 2016-12-05: qty 1

## 2016-12-05 MED ORDER — MOVING RIGHT ALONG BOOK
Freq: Once | Status: AC
  Administered 2016-12-05: 09:00:00
  Filled 2016-12-05 (×2): qty 1

## 2016-12-05 MED ORDER — POTASSIUM CHLORIDE CRYS ER 20 MEQ PO TBCR
20.0000 meq | EXTENDED_RELEASE_TABLET | ORAL | Status: AC
  Administered 2016-12-05 (×2): 20 meq via ORAL
  Filled 2016-12-05 (×2): qty 1

## 2016-12-05 MED ORDER — FUROSEMIDE 40 MG PO TABS
40.0000 mg | ORAL_TABLET | Freq: Every day | ORAL | Status: DC
  Administered 2016-12-06 – 2016-12-09 (×4): 40 mg via ORAL
  Filled 2016-12-05 (×4): qty 1

## 2016-12-05 MED ORDER — AMLODIPINE BESYLATE 10 MG PO TABS
10.0000 mg | ORAL_TABLET | Freq: Every day | ORAL | Status: DC
  Administered 2016-12-05 – 2016-12-09 (×5): 10 mg via ORAL
  Filled 2016-12-05 (×5): qty 1

## 2016-12-05 MED ORDER — POTASSIUM CHLORIDE CRYS ER 20 MEQ PO TBCR
20.0000 meq | EXTENDED_RELEASE_TABLET | Freq: Once | ORAL | Status: AC
  Administered 2016-12-05: 20 meq via ORAL
  Filled 2016-12-05: qty 1

## 2016-12-05 MED ORDER — ENOXAPARIN SODIUM 30 MG/0.3ML ~~LOC~~ SOLN
30.0000 mg | SUBCUTANEOUS | Status: DC
  Administered 2016-12-06 – 2016-12-08 (×2): 30 mg via SUBCUTANEOUS
  Filled 2016-12-05 (×3): qty 0.3

## 2016-12-05 NOTE — Progress Notes (Signed)
Patient ID: James Curtis, male   DOB: 1940/10/13, 76 y.o.   MRN: [id number] SICU Evening Rounds:  Hemodynamically stable in paced rhythm.  Awake and alert up in chair.  Still has confusion that fluctuates  Good urine output.  Continue current plans.

## 2016-12-05 NOTE — Clinical Social Work Note (Signed)
Clinical Social Work Assessment  Patient Details  Name: James Curtis MRN: [id number] Date of Birth: Dec 19, 1940  Date of referral:  12/05/16               Reason for consult:  Facility Placement                Permission sought to share information with:  Facility Sport and exercise psychologist, Family Supports Permission granted to share information::  Yes, Verbal Permission Granted  Name::     Public librarian::  SNF  Relationship::  son/HCPOA  Contact Information:     Housing/Transportation Living arrangements for the past 2 months:  Single Family Home (on his son's property) Source of Information:  Adult Children Patient Interpreter Needed:  None Criminal Activity/Legal Involvement Pertinent to Current Situation/Hospitalization:  No - Comment as needed Significant Relationships:  Adult Children Lives with:  Self (lives independently on son's property) Do you feel safe going back to the place where you live?  No Need for family participation in patient care:  Yes (Comment) (decision making)  Care giving concerns:  Pt lives in house on his son's property.  Per pt son patient was independent in his home prior to recent heart concerns- does not feel as if pt would be safe to return home and does not feel as if he can provide sufficient assistance.   Social Worker assessment / plan:  CSW spoke with pt son/HCPOA, James Curtis, regarding PT and MD recommendation for SNF.  Son was aware of these recommendations from medical team- CSW provided details about SNF and SNF referral process.  Employment status:  Retired Forensic scientist:  Medicare PT Recommendations:  [place] / Referral to community resources:  [place]  Patient/Family's Response to care:  Pt son agreeable to SNF placement and anxious to pick the best option for his father.  Patient/Family's Understanding of and Emotional Response to Diagnosis, Current Treatment, and Prognosis:  Son  very involved with pt care and decisions and has good understanding of plan at this time- hopeful pt will be back to baseline after short rehab stent.  Emotional Assessment Appearance:  Appears stated age Attitude/Demeanor/Rapport:  Unable to Assess Affect (typically observed):  Unable to Assess Orientation:  Oriented to Self, Oriented to Place Alcohol / Substance use:  Not Applicable Psych involvement (Current and /or in the community):  No (Comment)  Discharge Needs  Concerns to be addressed:  Care Coordination Readmission within the last 30 days:  No Current discharge risk:  Physical Impairment, Cognitively Impaired Barriers to Discharge:  Continued Medical Work up   Jorge Ny, LCSW 12/05/2016, 2:52 PM

## 2016-12-05 NOTE — Progress Notes (Addendum)
Hypoglycemic Event  CBG: 66 Treatment: 15 GM carbohydrate snack  Symptoms: no new symptoms, remains confused and restless, still ignoring sternal precautions  Follow-up CBG: Time:0806 CBG Result:75 Possible Reasons for Event: Unknown  Comments/MD notified:Dr. Roxy Manns at bedside    Vickey Sages

## 2016-12-05 NOTE — Progress Notes (Addendum)
[address]       [place] [address]             [phone]        CARDIOTHORACIC SURGERY PROGRESS NOTE   R3 Days Post-Op Procedure(s) (LRB): Resection of left atrial mass  (N/A) TRANSESOPHAGEAL ECHOCARDIOGRAM (TEE) (N/A) AORTIC VALVE REPLACEMENT (AVR) using a 23 Edwards Intuity Elite Aortic Valve (N/A)  Subjective: Looks good.  Pleasantly confused.  Alert and oriented to name.  Understands that he had heart surgery.  Denies pain, SOB.  Objective: Vital signs: BP Readings from Last 1 Encounters:  12/05/16 (!) 163/116   Pulse Readings from Last 1 Encounters:  12/05/16 75   Resp Readings from Last 1 Encounters:  12/05/16 (!) 25   Temp Readings from Last 1 Encounters:  12/05/16 98.4 F (36.9 C) (Oral)    Hemodynamics:    Physical Exam:  Rhythm:   paced  Breath sounds: clear  Heart sounds:  RRR  Incisions:  Dressing dry, intact  Abdomen:  Soft, non-distended, non-tender  Extremities:  Warm, well-perfused   Intake/Output from previous day: 04/22 0701 - 04/23 0700 In: 1534.2 [P.O.:740; I.V.:494.2; IV Piggyback:50] Out: 1620 [Urine:1590; Chest Tube:30] Intake/Output this shift: No intake/output data recorded.  Lab Results:  CBC: Recent Labs  12/04/16 0345 12/05/16 0428  WBC 16.4* 12.4*  HGB 8.5* 7.9*  HCT 24.9* 23.1*  PLT 127* 125*    BMET:  Recent Labs  12/04/16 1525 12/05/16 0428  NA 140 141  K 4.1 3.6  CL 110 110  CO2 25 25  GLUCOSE 142* 79  BUN 23* 24*  CREATININE 1.86* 1.62*  CALCIUM 8.6* 8.5*     PT/INR:   Recent Labs  12/02/16 1412  LABPROT 18.6*  INR 1.53    CBG (last 3)   Recent Labs  12/05/16 0007 12/05/16 0407 12/05/16 0748  GLUCAP 97 74 66    ABG    Component Value Date/Time   PHART 7.313 (L) 12/02/2016 2316   PCO2ART 46.2 12/02/2016 2316   PO2ART 66.0 (L) 12/02/2016 2316   HCO3 23.5 12/02/2016 2316   TCO2 24 12/03/2016 1650   ACIDBASEDEF 3.0 (H) 12/02/2016 2316   O2SAT 91.0  12/02/2016 2316    CXR: PORTABLE CHEST 1 VIEW  COMPARISON:  Prior chest x-ray 12/04/2016  FINDINGS: Right IJ vascular sheath remains in unchanged position with the tip of the sheath overlying the upper SVC. A left subclavian approach cardiac rhythm maintenance device is in stable position with leads projecting over the right atrium and right ventricle. Stable cardiomegaly and surgical changes of median sternotomy with aortic valve replacement. Atherosclerotic calcifications again noted throughout the transverse aorta. Pulmonary vascular congestion with mild edema. Right basilar atelectasis and small effusion. No pneumothorax. The left chest tube has been removed. No acute osseous abnormality.  IMPRESSION: 1. Interval removal of left-sided chest tube. No evidence of pneumothorax or other complicating feature. 2. Otherwise, stable appearance of the chest with persisting cardiomegaly, pulmonary edema and a small right pleural effusion with associated right basilar atelectasis.   Electronically Signed   By: Jacqulynn Cadet M.D.   On: 12/05/2016 07:45  Assessment/Plan: S/P Procedure(s) (LRB): Resection of left atrial mass  (N/A) TRANSESOPHAGEAL ECHOCARDIOGRAM (TEE) (N/A) AORTIC VALVE REPLACEMENT (AVR) using a 23 Edwards Intuity Elite Aortic Valve (N/A)  Overall doing well POD3 Maintaining stable rhythm and BP, somewhat hypertensive Breathing comfortably w/ O2 sats 93-97% on RA Baseline mild dementia w/ some exacerbation of confusion  postoperatively, has pulled out chest tubes and foley catheter when not under direct nursing supervision - remains very high risk for fall or other complications of impulsive behavior, but not combative or difficult to manage Elevated serum creatinine, likely due to prerenal azotemia +/- acute kidney injury caused by ATN on top of baseline CKD - creatinine trending down towards baseline Expected post op acute blood loss anemia, Hgb down  slightly CHB s/p permanent pacemaker placement   D/C temporary pacing wires  D/C central line  D/C Foley catheter  Mobilize  Restart Norvasc and increase Metoprolol  Watch anemia and start iron, folate  PT consult  Case manager to initiate search for SNF placement  Needs 24 hr/day bedside sitter and/or 1:1 nursing ratio without exception  Transfer step-down if bedside sitter available   Rexene Alberts, MD 12/05/2016 8:32 AM

## 2016-12-05 NOTE — Plan of Care (Signed)
Problem: Activity: Goal: Risk for activity intolerance will decrease Outcome: Progressing Pt walked x3 12/04/16  Problem: Bowel/Gastric: Goal: Gastrointestinal status for postoperative course will improve Outcome: Progressing Active BS. No BM yet  Problem: Cardiac: Goal: Hemodynamic stability will improve Outcome: Progressing Within parameters Goal: Ability to maintain an adequate cardiac output will improve Outcome: Progressing Within parameters

## 2016-12-05 NOTE — Progress Notes (Signed)
Physical Therapy Treatment Patient Details Name: James Curtis MRN: [id number] DOB: 11/19/1940 Today's Date: 12/05/2016    History of Present Illness 76 year old African-American male with known history of heart murmur, long-standing hypertension, hyperlipidemia, tobacco abuse, diet-controlled type 2 diabetes mellitus, and mild dementia who presented with left atrial mass and is now s/p TRANSESOPHAGEAL ECHOCARDIOGRAM (TEE) AORTIC VALVE REPLACEMENT.    PT Comments    Pt remains confused, restless and slightly impulsive. Pt did tolerate improved ambulation tolerance this date. Pt remains to require min/modA for all mobility and 24/7 assist/supervision for safety. If family can not provide this level of care pt will need to go to ST-SNF to address mentioned deficits and achieve safe mod I level of function.   Follow Up Recommendations  Home health PT;Supervision/Assistance - 24 hour     Equipment Recommendations  None recommended by PT    Recommendations for Other Services       Precautions / Restrictions Precautions Precautions: Sternal Precaution Comments: pt with no memory of precautions. pt re-educated however no carryover Restrictions Weight Bearing Restrictions: Yes (sternal precautions)    Mobility  Bed Mobility Overal bed mobility: Needs Assistance Bed Mobility: Rolling;Sidelying to Sit;Sit to Sidelying Rolling: Min assist;Mod assist Sidelying to sit: Min assist;Mod assist     Sit to sidelying: Mod assist;Min assist General bed mobility comments: max v/c's to use heart pillow and not push/pull with UEs. max tactile cues to move through task. modA for trunk elevation to sit EOB and minA for LE management back into bed  Transfers Overall transfer level: Needs assistance Equipment used: Rolling walker (2 wheeled) Transfers: Sit to/from Stand Sit to Stand: Min assist         General transfer comment: VCs for technique, min assist for safety due to  impulsivity  Ambulation/Gait Ambulation/Gait assistance: Min assist;+2 safety/equipment (for chair follow) Ambulation Distance (Feet): 400 Feet Assistive device: Rolling walker (2 wheeled) Gait Pattern/deviations: Step-through pattern Gait velocity: dec Gait velocity interpretation: Below normal speed for age/gender General Gait Details: v/c's to stand upright and minimize pressure through UEs. minA for walker management. max directional v/c's to navigate the hallway   Stairs            Wheelchair Mobility    Modified Rankin (Stroke Patients Only)       Balance Overall balance assessment: Needs assistance Sitting-balance support: Feet unsupported Sitting balance-Leahy Scale: Fair Sitting balance - Comments: occasional episodes of posterior lean   Standing balance support: Bilateral upper extremity supported Standing balance-Leahy Scale: Poor                              Cognition Arousal/Alertness: Awake/alert Behavior During Therapy: Restless Overall Cognitive Status: Impaired/Different from baseline Area of Impairment: Orientation;Memory;Following commands;Safety/judgement;Awareness;Problem solving                 Orientation Level: Place;Time   Memory: Decreased recall of precautions Following Commands: Follows one step commands with increased time Safety/Judgement: Decreased awareness of safety;Decreased awareness of deficits Awareness: Intellectual Problem Solving: Slow processing;Difficulty sequencing;Requires verbal cues;Requires tactile cues;Decreased initiation General Comments: pt with delayed processing, max step by step directional v/c's      Exercises      General Comments        Pertinent Vitals/Pain Pain Assessment: No/denies pain    Home Living                      Prior Function  PT Goals (current goals can now be found in the care plan section) Acute Rehab PT Goals Patient Stated Goal: didn't  state Progress towards PT goals: Progressing toward goals    Frequency    Min 3X/week      PT Plan Current plan remains appropriate    Co-evaluation             End of Session Equipment Utilized During Treatment: Gait belt Activity Tolerance: Patient tolerated treatment well Patient left: in bed;with call bell/phone within reach;with bed alarm set;with family/visitor present;with nursing/sitter in room;with restraints reapplied Nurse Communication: Mobility status PT Visit Diagnosis: Unsteadiness on feet (R26.81)     Time: 4970-2637 PT Time Calculation (min) (ACUTE ONLY): 24 min  Charges:  $Gait Training: 8-22 mins $Therapeutic Activity: 8-22 mins                    G Codes:      Kittie Plater, PT, DPT Pager #: [phone] Office #: [phone]    [place] 12/05/2016, 11:17 AM

## 2016-12-05 NOTE — Progress Notes (Signed)
Pt woke up confused, trying to climb out of bed.  Stated he needed to use the bathroom but had already been incontinent.  Reoriented to place and situation, reeducated on sternal precautions and not getting up without assistance. Placed back in chair per pt request.  Will continue to monitor pt closely.

## 2016-12-05 NOTE — Plan of Care (Signed)
Problem: Activity: Goal: Risk for activity intolerance will decrease Outcome: Progressing Pt walking x3 per day  Problem: Bowel/Gastric: Goal: Gastrointestinal status for postoperative course will improve Outcome: Progressing BM 12/05/2016   Problem: Cardiac: Goal: Hemodynamic stability will improve Outcome: Progressing Within parameters

## 2016-12-05 NOTE — NC FL2 (Signed)
[place] LEVEL OF CARE SCREENING TOOL     IDENTIFICATION  Patient Name: James Curtis Birthdate: 04/17/1941 Sex: male Admission Date (Current Location): 12/02/2016  [facility] [place] Number:  Herbalist and Address:  [facility], [place] [address] [place], [place] [address]      Provider Number: [id number]  Attending Physician Name and Address:  Rexene Alberts, MD  Relative Name and Phone Number:       Current Level of Care: Hospital Recommended Level of Care: [place] Prior Approval Number:    Date Approved/Denied:   PASRR Number: [id number]  Discharge Plan: SNF    Current Diagnoses: Patient Active Problem List   Diagnosis Date Noted  . S/P resection of left atrial mass 12/02/2016  . S/P resection of left atrial mass and aortic valve replacement with bioprosthetic valve 12/02/2016  . Type II diabetes mellitus ([place])   . Chronic kidney disease   . Incidental pulmonary nodule, greater than or equal to 58mm - needs f/u scan 3 months 11/28/2016  . Renal mass of unknown nature - needs f/u MRI 11/28/2016  . Non-obstructive CAD (coronary artery disease) 11/26/2016  . Mitral valve mass   . Aortic stenosis   . Dementia   . TIA (transient ischemic attack)   . Cardiac pacemaker in situ   . Essential hypertension   . Complete heart block (HCC) 11/18/2016    Orientation RESPIRATION BLADDER Height & Weight     Self, Place  Normal Indwelling catheter, Continent Weight: 225 lb 15.5 oz (102.5 kg) Height:  5\' 7"  (170.2 cm)  BEHAVIORAL SYMPTOMS/MOOD NEUROLOGICAL BOWEL NUTRITION STATUS  Other (Comment) (mild demential somewhat impulsive but no major behavioral concerns and noncombative)   Continent Diet (carb modified, cardiac)  AMBULATORY STATUS COMMUNICATION OF NEEDS Skin   Limited Assist Verbally Surgical wounds (located on chest- silver hydrofiber dressing)                       Personal Care  Assistance Level of Assistance  Bathing, Dressing Bathing Assistance: Limited assistance   Dressing Assistance: Limited assistance     Functional Limitations Info             SPECIAL CARE FACTORS FREQUENCY  PT (By licensed PT), OT (By licensed OT)     PT Frequency: 5/wk OT Frequency: 5/wk            Contractures      Additional Factors Info  Code Status, Allergies Code Status Info: FULL Allergies Info: NKA           Current Medications (12/05/2016):  This is the current hospital active medication list Current Facility-Administered Medications  Medication Dose Route Frequency Provider Last Rate Last Dose  . 0.9 %  sodium chloride infusion  250 mL Intravenous PRN Rexene Alberts, MD      . acetaminophen (TYLENOL) tablet 1,000 mg  1,000 mg Oral Q6H Rexene Alberts, MD   1,000 mg at 12/05/16 1300  . amLODipine (NORVASC) tablet 10 mg  10 mg Oral Daily Rexene Alberts, MD   10 mg at 12/05/16 9163  . aspirin EC tablet 325 mg  325 mg Oral Daily Rexene Alberts, MD   325 mg at 12/05/16 8466  . bisacodyl (DULCOLAX) EC tablet 10 mg  10 mg Oral Daily Rexene Alberts, MD   10 mg at 12/05/16 5993   Or  . bisacodyl (DULCOLAX) suppository 10  mg  10 mg Rectal Daily Rexene Alberts, MD      . cholecalciferol (VITAMIN D) tablet 1,000 Units  1,000 Units Oral Daily Rexene Alberts, MD   1,000 Units at 12/05/16 [phone]  . docusate sodium (COLACE) capsule 200 mg  200 mg Oral Daily Rexene Alberts, MD   200 mg at 12/05/16 3570  . [START ON 12/06/2016] enoxaparin (LOVENOX) injection 30 mg  30 mg Subcutaneous Q48H Rexene Alberts, MD      . folic acid-pyridoxine-cyancobalamin (FOLTX) 2.5-25-2 MG per tablet 1 tablet  1 tablet Oral Daily Rexene Alberts, MD      . Derrill Memo ON 12/06/2016] furosemide (LASIX) tablet 40 mg  40 mg Oral Daily Rexene Alberts, MD      . iron polysaccharides (NIFEREX) capsule 150 mg  150 mg Oral Daily Rexene Alberts, MD   150 mg at 12/05/16 0932  . metoprolol (LOPRESSOR)  injection 2.5-5 mg  2.5-5 mg Intravenous Q2H PRN Rexene Alberts, MD   2.5 mg at 12/05/16 0533  . metoprolol tartrate (LOPRESSOR) tablet 25 mg  25 mg Oral BID Rexene Alberts, MD   25 mg at 12/05/16 1779  . ondansetron (ZOFRAN) injection 4 mg  4 mg Intravenous Q6H PRN Rexene Alberts, MD      . pantoprazole (PROTONIX) EC tablet 40 mg  40 mg Oral Daily Rexene Alberts, MD   40 mg at 12/05/16 3903  . potassium chloride SA (K-DUR,KLOR-CON) CR tablet 20 mEq  20 mEq Oral Q4H Rexene Alberts, MD   20 mEq at 12/05/16 1301  . simvastatin (ZOCOR) tablet 20 mg  20 mg Oral QHS Rexene Alberts, MD      . sodium chloride flush (NS) 0.9 % injection 3 mL  3 mL Intravenous Q12H Rexene Alberts, MD   3 mL at 12/04/16 2125  . sodium chloride flush (NS) 0.9 % injection 3 mL  3 mL Intravenous PRN Rexene Alberts, MD      . sodium chloride flush (NS) 0.9 % injection 3 mL  3 mL Intravenous Q12H Rexene Alberts, MD   3 mL at 12/05/16 0926  . sodium chloride flush (NS) 0.9 % injection 3 mL  3 mL Intravenous PRN Rexene Alberts, MD      . traMADol Veatrice Bourbon) tablet 50 mg  50 mg Oral Q6H PRN Ivin Poot, MD         Discharge Medications: Please see discharge summary for a list of discharge medications.  Relevant Imaging Results:  Relevant Lab Results:   Additional Information SS#: [id number]  Jorge Ny, LCSW

## 2016-12-05 NOTE — Progress Notes (Signed)
Pt continues to be confused and restless. Also continues to ignore sternal precautions despite constant reminders and re-education. In room with pt, will continue to monitor closely. Eleonore Chiquito RN 2 Heart

## 2016-12-05 NOTE — Care Management Note (Signed)
Case Management Note Marvetta Gibbons RN, BSN Unit 2W-Case Manager [phone]  Patient Details  Name: James Curtis MRN: [id number] Date of Birth: 1940/09/12  Subjective/Objective:  Pt admitted s/p AVR with  Resection of left atrial mass                   Action/Plan: PTA pt lived at home with son, PT eval recommending SNF vs home with [facility] pending on what family can provide for support- CSW consulted for possible SNF- CM will continue to follow for d/c needs  Expected Discharge Date:                  Expected Discharge Plan:  Skilled Nursing Facility  In-House Referral:  Clinical Social Work  Discharge planning Services  CM Consult  Post Acute Care Choice:    Choice offered to:     DME Arranged:    DME Agency:     HH Arranged:    [place] Agency:     Status of Service:  In process, will continue to follow  If discussed at Long Length of Stay Meetings, dates discussed:    Discharge Disposition:   Additional Comments:  Dawayne Patricia, RN 12/05/2016, 12:19 PM

## 2016-12-06 ENCOUNTER — Inpatient Hospital Stay (HOSPITAL_COMMUNITY): Payer: Medicare Other

## 2016-12-06 LAB — TYPE AND SCREEN
ABO/RH(D): B POS
ANTIBODY SCREEN: NEGATIVE
UNIT DIVISION: 0
UNIT DIVISION: 0
UNIT DIVISION: 0
Unit division: 0
Unit division: 0
Unit division: 0

## 2016-12-06 LAB — BPAM RBC
BLOOD PRODUCT EXPIRATION DATE: 201805112359
BLOOD PRODUCT EXPIRATION DATE: 201805192359
BLOOD PRODUCT EXPIRATION DATE: 201805192359
BLOOD PRODUCT EXPIRATION DATE: 201805202359
Blood Product Expiration Date: 201805112359
Blood Product Expiration Date: 201805192359
ISSUE DATE / TIME: 201804200952
ISSUE DATE / TIME: 201804200952
ISSUE DATE / TIME: 201804201425
ISSUE DATE / TIME: 201804201540
UNIT TYPE AND RH: 7300
UNIT TYPE AND RH: 7300
UNIT TYPE AND RH: 7300
UNIT TYPE AND RH: 7300
Unit Type and Rh: 7300
Unit Type and Rh: 7300

## 2016-12-06 LAB — CBC
HCT: 22.6 % — ABNORMAL LOW (ref 39.0–52.0)
HEMOGLOBIN: 7.7 g/dL — AB (ref 13.0–17.0)
MCH: 29.7 pg (ref 26.0–34.0)
MCHC: 34.1 g/dL (ref 30.0–36.0)
MCV: 87.3 fL (ref 78.0–100.0)
Platelets: 144 10*3/uL — ABNORMAL LOW (ref 150–400)
RBC: 2.59 MIL/uL — ABNORMAL LOW (ref 4.22–5.81)
RDW: 15.2 % (ref 11.5–15.5)
WBC: 9.7 10*3/uL (ref 4.0–10.5)

## 2016-12-06 LAB — BASIC METABOLIC PANEL
Anion gap: 6 (ref 5–15)
BUN: 20 mg/dL (ref 6–20)
CALCIUM: 8.6 mg/dL — AB (ref 8.9–10.3)
CHLORIDE: 111 mmol/L (ref 101–111)
CO2: 25 mmol/L (ref 22–32)
CREATININE: 1.61 mg/dL — AB (ref 0.61–1.24)
GFR, EST AFRICAN AMERICAN: 46 mL/min — AB (ref >60)
GFR, EST NON AFRICAN AMERICAN: 40 mL/min — AB (ref >60)
Glucose, Bld: 116 mg/dL — ABNORMAL HIGH (ref 65–99)
Potassium: 4.3 mmol/L (ref 3.5–5.1)
SODIUM: 142 mmol/L (ref 135–145)

## 2016-12-06 NOTE — Progress Notes (Addendum)
[address]       [place], [address]             [phone]        CARDIOTHORACIC SURGERY PROGRESS NOTE   R4 Days Post-Op Procedure(s) (LRB): Resection of left atrial mass  (N/A) TRANSESOPHAGEAL ECHOCARDIOGRAM (TEE) (N/A) AORTIC VALVE REPLACEMENT (AVR) using a 23 Edwards Intuity Elite Aortic Valve (N/A)  Subjective: Looks good.  Awake and alert, oriented to name.  Denies pain, SOB.  Hungry for breakfast.  Objective: Vital signs: BP Readings from Last 1 Encounters:  12/06/16 (!) 133/110   Pulse Readings from Last 1 Encounters:  12/06/16 92   Resp Readings from Last 1 Encounters:  12/06/16 (!) 21   Temp Readings from Last 1 Encounters:  12/06/16 99.5 F (37.5 C) (Oral)    Hemodynamics:    Physical Exam:  Rhythm:   paced  Breath sounds: clear  Heart sounds:  RRR  Incisions:  Clean and dry  Abdomen:  Soft, non-distended, non-tender  Extremities:  Warm, well-perfused    Intake/Output from previous day: 04/23 0701 - 04/24 0700 In: 1380 [P.O.:1360; I.V.:20] Out: 3600 [Urine:3600] Intake/Output this shift: No intake/output data recorded.  Lab Results:  CBC: Recent Labs  12/05/16 0428 12/06/16 0208  WBC 12.4* 9.7  HGB 7.9* 7.7*  HCT 23.1* 22.6*  PLT 125* 144*    BMET:  Recent Labs  12/05/16 0428 12/06/16 0208  NA 141 142  K 3.6 4.3  CL 110 111  CO2 25 25  GLUCOSE 79 116*  BUN 24* 20  CREATININE 1.62* 1.61*  CALCIUM 8.5* 8.6*     PT/INR:  No results for input(s): LABPROT, INR in the last 72 hours.  CBG (last 3)   Recent Labs  12/05/16 0407 12/05/16 0748 12/05/16 0806  GLUCAP 74 66 75    ABG    Component Value Date/Time   PHART 7.313 (L) 12/02/2016 2316   PCO2ART 46.2 12/02/2016 2316   PO2ART 66.0 (L) 12/02/2016 2316   HCO3 23.5 12/02/2016 2316   TCO2 24 12/03/2016 1650   ACIDBASEDEF 3.0 (H) 12/02/2016 2316   O2SAT 91.0 12/02/2016 2316    CXR: CHEST  2 VIEW  COMPARISON:  Multiple prior chest  x-ray, most recent 12/05/2016. Prior CT 11/26/2016  FINDINGS: Cardiomediastinal silhouette relatively unchanged in size and contour. Calcifications of the aortic arch.  Surgical changes of median sternotomy and aortic valve replacement.  Unchanged cardiac pacing device on left chest wall.  Interval removal of right IJ sheath.  Compared to the preoperative x-ray of 11/22/2016, there is relatively similar reticulonodular opacity at the bilateral lung bases, with low lung volumes.  No large pleural effusion or pneumothorax.  No interlobular septal thickening.  IMPRESSION: Compared to the preoperative chest x-ray, there is relatively similar appearance of reticulonodular opacity, most pronounced at the lung bases with low lung volumes. Findings favored to represent atelectasis and chronic lung fibrosis/scarring, however, mild edema and/or developing infection cannot be excluded.  Surgical changes of median sternotomy and aortic valve replacement.  Aortic atherosclerosis.  Interval removal of right IJ sheath.   Electronically Signed   By: Corrie Mckusick D.O.   On: 12/06/2016 08:11  Assessment/Plan: S/P Procedure(s) (LRB): Resection of left atrial mass  (N/A) TRANSESOPHAGEAL ECHOCARDIOGRAM (TEE) (N/A) AORTIC VALVE REPLACEMENT (AVR) using a 23 Edwards Intuity Elite Aortic Valve (N/A)  Overall doing well POD4 Maintaining stable rhythm and BP, hypertension improved Breathing comfortably w/ O2 sats 93-97% on RA  Baseline mild dementia w/ some exacerbation of confusion postoperatively - remains very high risk for fall or other complications of impulsive behavior, but not combative or difficult to manage Elevated serum creatinine, likely due to prerenal azotemia +/- acute kidney injury caused by ATN on top of baseline CKD (stage III) - creatinine trending down towards baseline Chronic diastolic CHF with expected post-op volume excess, I/O's negative 2.2 liters  yesterday and weight down 5 lbs, now just 3 lbs > preop Expected post op acute blood loss anemia, Hgb down slightly 7.7 CHB s/p permanent pacemaker placement   Mobilize  Watch anemia and continue iron, folate  Gentle diuresis  PT consult  Case manager to initiate search for SNF placement  Needs 24 hr/day bedside sitter and/or 1:1 nursing ratio without exception  Transfer step-down if bedside sitter available  Rexene Alberts, MD 12/06/2016 8:49 AM

## 2016-12-07 LAB — AEROBIC/ANAEROBIC CULTURE (SURGICAL/DEEP WOUND)
CULTURE: NO GROWTH
GRAM STAIN: NONE SEEN

## 2016-12-07 LAB — AEROBIC/ANAEROBIC CULTURE W GRAM STAIN (SURGICAL/DEEP WOUND)

## 2016-12-07 NOTE — Progress Notes (Signed)
CARDIAC REHAB PHASE I   PRE:  Rate/Rhythm: 64 paced  BP:  Sitting: 146/62        SaO2: 97 RA  MODE:  Ambulation: 550 ft   POST:  Rate/Rhythm: 74 paced  BP:  Sitting: 130/84         SaO2: 100 RA  Pt in bed, required some assistance from lying to sitting position, verbal reminder cues for sternal precautions. Pt ambulated 550 ft on RA, rolling walker, mostly steady gait, tolerated well. Pt c/o mild DOE, standing rest x1. Pt unable to state the type of surgery he had, unable to recall room number. Pt to recliner after walk, feet elevated, call bell within reach, sitter at bedside. Will follow.    [place], RN, BSN 12/07/2016 1:26 PM

## 2016-12-07 NOTE — Progress Notes (Addendum)
      [address]       [place] [address]             [phone]      5 Days Post-Op Procedure(s) (LRB): Resection of left atrial mass  (N/A) TRANSESOPHAGEAL ECHOCARDIOGRAM (TEE) (N/A) AORTIC VALVE REPLACEMENT (AVR) using a 23 Edwards Intuity Elite Aortic Valve (N/A)   Subjective:  Looks good.  No complaints, states he is feeling pretty good  Objective: Vital signs in last 24 hours: Temp:  [97.8 F (36.6 C)-98.7 F (37.1 C)] 98.7 F (37.1 C) (04/24 2130) Pulse Rate:  [71-94] 79 (04/24 2130) Cardiac Rhythm: Ventricular paced (04/24 1950) Resp:  [14-25] 18 (04/24 2130) BP: (131-158)/(73-89) 140/78 (04/24 2130) SpO2:  [90 %-100 %] 94 % (04/24 2130) Weight:  [218 lb 14.7 oz (99.3 kg)] 218 lb 14.7 oz (99.3 kg) (04/25 0443)  Intake/Output from previous day: 04/24 0701 - 04/25 0700 In: 633 [P.O.:840; I.V.:3] Out: 1350 [Urine:1350]  General appearance: alert, cooperative and no distress Heart: regular rate and rhythm and paced Lungs: clear to auscultation bilaterally Abdomen: soft, non-tender; bowel sounds normal; no masses,  no organomegaly Extremities: edema trace Wound: clean and dry  Lab Results:  Recent Labs  12/05/16 0428 12/06/16 0208  WBC 12.4* 9.7  HGB 7.9* 7.7*  HCT 23.1* 22.6*  PLT 125* 144*   BMET:  Recent Labs  12/05/16 0428 12/06/16 0208  NA 141 142  K 3.6 4.3  CL 110 111  CO2 25 25  GLUCOSE 79 116*  BUN 24* 20  CREATININE 1.62* 1.61*  CALCIUM 8.5* 8.6*    PT/INR: No results for input(s): LABPROT, INR in the last 72 hours. ABG    Component Value Date/Time   PHART 7.313 (L) 12/02/2016 2316   HCO3 23.5 12/02/2016 2316   TCO2 24 12/03/2016 1650   ACIDBASEDEF 3.0 (H) 12/02/2016 2316   O2SAT 91.0 12/02/2016 2316   CBG (last 3)   Recent Labs  12/05/16 0407 12/05/16 0748 12/05/16 0806  GLUCAP 74 66 75    Assessment/Plan: S/P Procedure(s) (LRB): Resection of left atrial mass  (N/A) TRANSESOPHAGEAL  ECHOCARDIOGRAM (TEE) (N/A) AORTIC VALVE REPLACEMENT (AVR) using a 23 Edwards Intuity Elite Aortic Valve (N/A)  1. CV- paced rhythm, + HTN- continue Lopressor, Norvasc, cant restart home Diovan due to elevated creatinine 2. Pulm- no acute issues, off oxygen, continue IS 3. Renal- creatinine was last 1.61, BMET ordered for AM, weight is below baseline, currently on lasix, may be able to d/c tomorrow 4. Expected post operative blood loss anemia0 Hgb at 7.7, monitoring continue Iron 5. Neuro- + dementia, confusion is resolving, ? Need for safety sitter will discuss with Dr. Roxy Manns as need to be 24 hours free of sitter to go to SNF 6. Dispo- patient is stable, continue current care, will repeat labs in AM, likely ready for d/c in next 24-48 hours   LOS: 5 days    BARRETT, ERIN 12/07/2016  I have seen and examined the patient and agree with the assessment and plan as outlined.  Making good progress.  Recheck Hgb and creatinine tomorrow.  Anticipate possible d/c to SNF 1-2 days.  Discussed patient's condition and plans w/ son at bedside.  Rexene Alberts, MD 12/07/2016 8:37 AM

## 2016-12-07 NOTE — Discharge Instructions (Signed)
Aortic Valve Replacement, Care After °Refer to this sheet in the next few weeks. These instructions provide you with information about caring for yourself after your procedure. Your health care provider may also give you more specific instructions. Your treatment has been planned according to current medical practices, but problems sometimes occur. Call your health care provider if you have any problems or questions after your procedure. °What can I expect after the procedure? °After the procedure, it is common to have: °· Pain around your incision area. °· A small amount of blood or clear fluid coming from your incision. ° °Follow these instructions at home: °Eating and drinking ° °· Follow instructions from your health care provider about eating or drinking restrictions. °? Limit alcohol intake to no more than 1 drink per day for nonpregnant women and 2 drinks per day for men. One drink equals 12 oz of beer, 5 oz of wine, or 1½ oz of hard liquor. °? Limit how much caffeine you drink. Caffeine can affect your heart's rate and rhythm. °· Drink enough fluid to keep your urine clear or pale yellow. °· Eat a heart-healthy diet. This should include plenty of fresh fruits and vegetables. If you eat meat, it should be lean cuts. Avoid foods that are: °? High in salt, saturated fat, or sugar. °? Canned or highly processed. °? Fried. °Activity °· Return to your normal activities as told by your health care provider. Ask your health care provider what activities are safe for you. °· Exercise regularly once you have recovered, as told by your health care provider. °· Avoid sitting for more than 2 hours at a time without moving. Get up and move around at least once every 1-2 hours. This helps to prevent blood clots in the legs. °· Do not lift anything that is heavier than 10 lb (4.5 kg) until your health care provider approves. °· Avoid pushing or pulling things with your arms until your health care provider approves. This  includes pulling on handrails to help you climb stairs. °Incision care ° °· Follow instructions from your health care provider about how to take care of your incision. Make sure you: °? Wash your hands with soap and water before you change your bandage (dressing). If soap and water are not available, use hand sanitizer. °? Change your dressing as told by your health care provider. °? Leave stitches (sutures), skin glue, or adhesive strips in place. These skin closures may need to stay in place for 2 weeks or longer. If adhesive strip edges start to loosen and curl up, you may trim the loose edges. Do not remove adhesive strips completely unless your health care provider tells you to do that. °· Check your incision area every day for signs of infection. Check for: °? More redness, swelling, or pain. °? More fluid or blood. °? Warmth. °? Pus or a bad smell. °Medicines °· Take over-the-counter and prescription medicines only as told by your health care provider. °· If you were prescribed an antibiotic medicine, take it as told by your health care provider. Do not stop taking the antibiotic even if you start to feel better. °Travel °· Avoid airplane travel for as long as told by your health care provider. °· When you travel, bring a list of your medicines and a record of your medical history with you. Carry your medicines with you. °Driving °· Ask your health care provider when it is safe for you to drive. Do not drive until your health   care provider approves. °· Do not drive or operate heavy machinery while taking prescription pain medicine. °Lifestyle ° °· Do not use any tobacco products, such as cigarettes, chewing tobacco, or e-cigarettes. If you need help quitting, ask your health care provider. °· Resume sexual activity as told by your health care provider. Do not use medicines for erectile dysfunction unless your health care provider approves, if this applies. °· Work with your health care provider to keep your  blood pressure and cholesterol under control, and to manage any other heart conditions that you have. °· Maintain a healthy weight. °General instructions °· Do not take baths, swim, or use a hot tub until your health care provider approves. °· Do not strain to have a bowel movement. °· Avoid crossing your legs while sitting down. °· Check your temperature every day for a fever. A fever may be a sign of infection. °· If you are a woman and you plan to become pregnant, talk with your health care provider before you become pregnant. °· Wear compression stockings if your health care provider instructs you to do this. These stockings help to prevent blood clots and reduce swelling in your legs. °· Tell all health care providers who care for you that you have an artificial (prosthetic) aortic valve. If you have or have had heart disease or endocarditis, tell all health care providers about these conditions as well. °· Keep all follow-up visits as told by your health care provider. This is important. °Contact a health care provider if: °· You develop a skin rash. °· You experience sudden, unexplained changes in your weight. °· You have more redness, swelling, or pain around your incision. °· You have more fluid or blood coming from your incision. °· Your incision feels warm to the touch. °· You have pus or a bad smell coming from your incision. °· You have a fever. °Get help right away if: °· You develop chest pain that is different from the pain coming from your incision. °· You develop shortness of breath or difficulty breathing. °· You start to feel light-headed. °These symptoms may represent a serious problem that is an emergency. Do not wait to see if the symptoms will go away. Get medical help right away. Call your local emergency services (911 in the [place] Do not drive yourself to the hospital. °This information is not intended to replace advice given to you by your health care provider. Make sure you discuss any  questions you have with your health care provider. °Document Released: 02/17/2005 Document Revised: 01/07/2016 Document Reviewed: 07/05/2015 °Elsevier Interactive Patient Education © 2017 [employer] ° °

## 2016-12-07 NOTE — Progress Notes (Signed)
Physical Therapy Treatment Patient Details Name: James Curtis MRN: [id number] DOB: [birth year] Today's Date: 12/07/2016    History of Present Illness 76 year old African-American male with known history of heart murmur, long-standing hypertension, hyperlipidemia, tobacco abuse, diet-controlled type 2 diabetes mellitus, and mild dementia who presented with left atrial mass and is now s/p TRANSESOPHAGEAL ECHOCARDIOGRAM (TEE) AORTIC VALVE REPLACEMENT.    PT Comments    Pt with improved awareness and orientation today though still needed reminding of sternal precautions in relation to bed mobility. Ambulated 400' with RW and normal pace, min-guard A, also ambulated 20' without AD and no LOB. VSS throughout. PT will continue to follow.    Follow Up Recommendations  Home health PT;Supervision/Assistance - 24 hour     Equipment Recommendations  None recommended by PT    Recommendations for Other Services       Precautions / Restrictions Precautions Precautions: Sternal Precaution Comments: pt remembered that he should not push or pull Restrictions Weight Bearing Restrictions: Yes (sternal)    Mobility  Bed Mobility Overal bed mobility: Needs Assistance Bed Mobility: Rolling;Sidelying to Sit;Sit to Sidelying Rolling: Supervision Sidelying to sit: Supervision     Sit to sidelying: Supervision General bed mobility comments: discussed precautions and pt not sitting straight up in bed but when cued to get out of bed, slid legs off and was going to try to sit straight up. vc's for legs back into bed and rolling first, then up sideways, pt was able to correct and perform with S  Transfers Overall transfer level: Needs assistance Equipment used: Rolling walker (2 wheeled) Transfers: Sit to/from Stand Sit to Stand: Min assist         General transfer comment: min A for power up and steady  Ambulation/Gait Ambulation/Gait assistance: Min guard Ambulation Distance (Feet): 400  Feet Assistive device: Rolling walker (2 wheeled);None Gait Pattern/deviations: Step-through pattern Gait velocity: WFL Gait velocity interpretation: at or above normal speed for age/gender General Gait Details: safe ambulation with RW, practiced 20' without RW as well   Stairs            Wheelchair Mobility    Modified Rankin (Stroke Patients Only)       Balance Overall balance assessment: Needs assistance Sitting-balance support: Feet supported Sitting balance-Leahy Scale: Good     Standing balance support: No upper extremity supported Standing balance-Leahy Scale: Fair Standing balance comment: worked on standing balance exercises at sink, pt able to maintain static standing without support but increased safety with dynamic activities with support                            Cognition Arousal/Alertness: Awake/alert Behavior During Therapy: WFL for tasks assessed/performed Overall Cognitive Status: Impaired/Different from baseline Area of Impairment: Memory;Following commands;Safety/judgement;Awareness;Problem solving                     Memory: Decreased recall of precautions Following Commands: Follows one step commands consistently;Follows one step commands with increased time;Follows multi-step commands with increased time;Follows multi-step commands inconsistently Safety/Judgement: Decreased awareness of safety;Decreased awareness of deficits Awareness: Emergent Problem Solving: Slow processing;Requires verbal cues General Comments: delayed processing still noted but pt better oriented, told me that his wall calendar had not been changed. Still needed cues for body awareness with bed mobility      Exercises General Exercises - Lower Extremity Hip ABduction/ADduction: AROM;Both;10 reps;Standing Hip Flexion/Marching: AROM;Both;10 reps;Standing Heel Raises: AROM;Both;10 reps;Standing Mini-Sqauts: AROM;10 reps;Standing  General Comments  General comments (skin integrity, edema, etc.): O2 sats remained in 90's, HR between 80-92 bpm with activity      Pertinent Vitals/Pain Pain Assessment: Faces Faces Pain Scale: Hurts a little bit Pain Location: chest Pain Descriptors / Indicators: Discomfort Pain Intervention(s): Monitored during session    Home Living                      Prior Function            PT Goals (current goals can now be found in the care plan section) Acute Rehab PT Goals Patient Stated Goal: none stated PT Goal Formulation: With patient Time For Goal Achievement: 12/18/16 Potential to Achieve Goals: Good Progress towards PT goals: Progressing toward goals    Frequency    Min 3X/week      PT Plan Current plan remains appropriate    Co-evaluation             End of Session Equipment Utilized During Treatment: Gait belt Activity Tolerance: Patient tolerated treatment well Patient left: in bed;with call bell/phone within reach;with nursing/sitter in room Nurse Communication: Mobility status PT Visit Diagnosis: Unsteadiness on feet (R26.81)     Time: 1518-3437 PT Time Calculation (min) (ACUTE ONLY): 23 min  Charges:  $Gait Training: 23-37 mins                    G Codes:       Leighton Roach, PT  Acute Rehab Services  [place] 12/07/2016, 12:27 PM

## 2016-12-07 NOTE — Care Management Important Message (Signed)
Important Message  Patient Details  Name: James Curtis MRN: [id number] Date of Birth: Jul 13, 1941   Medicare Important Message Given:  Yes    Nathen May 12/07/2016, 12:31 PM

## 2016-12-07 NOTE — Discharge Summary (Cosign Needed Addendum)
Dictation #1 [id number]  [id number] Physician Discharge Summary  Patient ID: James Curtis MRN: [id number] DOB/AGE: 03-14-1941 76 y.o.  Admit date: 12/02/2016 Discharge date: 12/09/2016  Admission Diagnoses:  Patient Active Problem List   Diagnosis Date Noted  . Type II diabetes mellitus ([place])   . Chronic kidney disease - stage III   . Incidental pulmonary nodule, greater than or equal to 39mm - needs f/u scan 3 months 11/28/2016  . Renal mass of unknown nature - needs f/u MRI 11/28/2016  . Non-obstructive CAD (coronary artery disease) 11/26/2016  . Mitral valve mass   . Aortic stenosis   . Dementia   . TIA (transient ischemic attack)   . Cardiac pacemaker in situ   . Essential hypertension   . Complete heart block ([place]) 11/18/2016   Discharge Diagnoses:   Patient Active Problem List   Diagnosis Date Noted  . S/P resection of left atrial mass 12/02/2016  . S/P resection of left atrial mass and aortic valve replacement with bioprosthetic valve 12/02/2016  . Type II diabetes mellitus ([place])   . Chronic kidney disease - stage III   . Incidental pulmonary nodule, greater than or equal to 65mm - needs f/u scan 3 months 11/28/2016  . Renal mass of unknown nature - needs f/u MRI 11/28/2016  . Non-obstructive CAD (coronary artery disease) 11/26/2016  . Mitral valve mass   . Aortic stenosis   . Dementia   . TIA (transient ischemic attack)   . Cardiac pacemaker in situ   . Essential hypertension   . Complete heart block ([place]) 11/18/2016   Discharged Condition: good  History of Present Illness:  James Curtis is a 76 yo African-American male with known history of heart murmur, long-standing hypertension, hyperlipidemia, tobacco abuse, diet-controlled type 2 diabetes mellitus, and mild dementia who has been referred for surgical consultation to discuss treatment options for management of recently discovered small,mobile-appearing mass in the left atrium within the context of  possible recent TIA. The patient has long-standing history of hypertension and a heart murmur for which he was evaluated many years ago in [place]. After his wife passed away several years ago the patient moved to [place] to live close to his son. According to the patient's son he has developed relatively mild problems with short-term memory loss and poor insight over the past year or so. He has been fully evaluated by a neurologist (Dr. Leta Baptist) on 2 previousoccasions, and diagnosed with mild dementia. Despite this, he has remained functionally independent and reasonably active. He was last seen by his neurologist in [place] more recently he was seen by his primary care provider on 11/16/2016 for routine annual follow-up. At that time he was noted to be hypotensive and bradycardic. Coreg was discontinued. On the morning of 11/18/2016 the patient's son telephoned him and noted that the patient sounded confused, somewhat agitated, and had slurred speech. His son came to check on and promptly and noted what he thought might be right sided facial droop and slurred speech. These symptoms all seemed to resolve within 10-15 minutes. He brought the patient directly to the emergency department for evaluation. EKG at the time revealed complete heart block with heart rate of 40. He was not hypotensive. He was admitted to the hospital and evaluated by neurology and cardiology. MRI of the brain revealed no acute intracranial abnormality and stable mild chronic microvascular ischemic changes with some mild parenchymal volume loss and stable small chronic lacunar infarct in the left hemi-pons. The patient's  transient neurologic symptoms were attributed to a likely TIA. Transthoracic echocardiogram was performed demonstrating vigorous left ventricular systolic function with at least mild concentric left ventricular hypertrophy. There was moderate aortic stenosis with peak velocity across the aortic valve  measuring close to 4 m/s corresponding to mean transvalvular gradient estimated 23 mmHg. The DVI was reported 0.42. There was severe calcification in the mitral annulus with thickened moderately calcified leaflets. There was felt to be moderate mitral stenosis and mild mitral regurgitation. In addition there was a mobile echodensity attached to the posterior mitral annulus within the left atrium measuring greater than 1 cm in its greatest dimension, suspicious for possible vegetation. TEE was recommended. The patient underwent implantation of a dual-chamber pacemaker on 11/21/2016 and TEE on 11/23/2016. TEE confirmed the presence of some degree of aortic stenosis with moderate thickening with restricted leaflet mobility and significant calcification and restriction of the noncoronary cusp of the aortic valve. There was mild calcification of the mitral annulus with only mildly thickened leaflets and no significant mitral stenosis. There was mild mitral regurgitation. 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  He was evaluated by Dr. Roxy Manns who felt the patient would require resection of the left atrial mass.  However, it was also felt the patient should have intervention on his Aortic valve as well.  The risks and benefits of the procedure were explained to the patient and he was agreeable to proceed.  Hospital Course:   James Curtis presented to [facility] on [facility].  He underwent Resection of Left Atrial Mass and Aortic Valve Replacement with a 23 mm Edwards Intuity rapid deployment pericardial tissue valve.  He tolerated the procedure without difficulty and was taken to the SICU in stable condition. The patient was extubated the evening of surgery.  During his stay in the SICU the patient developed confusion, agitation, and delirium.  He pulled his foley  catheter and 3 of 4 chest tubes out.  There was minor bleeding from patient's foley removal. Follow up stat CXR was ordered and showed no evidence of pneumothorax.  Due to this a Air cabin crew was initiated.  The patient was weaned off Dopamine as tolerated.  CXR remained stable, output was low and his final chest tube was removed on POD #2.  He has a PPM in place and his temporary pacemaker wires were removed.  He was hypertensive and his home Norvasc was restarted.  He developed a mild elevation in his creatinine.  He had mild hypervolemia and was diuresed with lasix.  He was felt medically stable for transfer to the stepdown unit on POD #4.  The patient continues to make progress.  His confusion and agitation are improving, but happen mostly at night.  His creatinine level is trending down towards baseline and he has good urinary output.  He does have some blood in his urine and from his penis.  This is due to trauma from foley catheter removal.  Patient pulled foley out himself.  He will follow up with Urology in 1 week.  His hemoglobin has remained stable.  He is on iron supplementation for this.  He continues to participate with PT/OT.  He has been without a Air cabin crew for the past 24 hours.  He is felt medically stable for discharge to SNF today.             Significant  Diagnostic Studies: Surgical Pathology  1. Heart, biopsy, Left - CALCIFIED FIBROTIC NODULE. - NO EVIDENCE OF MALIGNANCY. 2. Heart valve leaflets - HEART VALVE LEAFLETS WITH EXTENSIVE CALCIFICATION.  Treatments: surgery:    Resection of Left Atrial Mass             Benign dystrophic fibrotic tissue with calcification, attached to posterior mitral annulus by thin fibrotic stalk   Aortic Valve Replacement             Edwards Intuity Elite rapid deployment bovine pericardial tissue valve (size 23 mm, model # 1443XV, serial # [id number])  Disposition: SNF  DIET: HEART HEALTHY  Discharge Medications:  The patient has been  discharged on:   1.Beta Blocker:  Yes [ x  ]                              No   [   ]                              If No, reason:  2.Ace Inhibitor/ARB: Yes [   ]                                     No  [x    ]                                     If No, reason: creatinine elevation  3.Statin:   Yes [ x  ]                  No  [   ]                  If No, reason:  4.Ecasa:  Yes  [ x  ]                  No   [   ]                  If No, reason:     Discharge Instructions    Amb Referral to Cardiac Rehabilitation    Complete by:  As directed    Diagnosis:  Valve Replacement Comment - resection of atrial mass   Valve:  Aortic     Allergies as of 12/09/2016   No Known Allergies     Medication List    STOP taking these medications   ibuprofen 200 MG tablet Commonly known as:  ADVIL,MOTRIN   valsartan 320 MG tablet Commonly known as:  DIOVAN     TAKE these medications   amLODipine 10 MG tablet Commonly known as:  NORVASC Take 10 mg by mouth daily.   aspirin EC 325 MG tablet Take 1 tablet (325 mg total) by mouth daily.   cholecalciferol 1000 units tablet Commonly known as:  VITAMIN D Take 1,000 Units by mouth daily.   folic acid-pyridoxine-cyancobalamin 2.5-25-2 MG Tabs tablet Commonly known as:  FOLTX Take 1 tablet by mouth daily.   furosemide 40 MG tablet Commonly known as:  LASIX Take 1 tablet (40 mg total) by mouth daily. For 7 days   iron polysaccharides 150 MG capsule Commonly known as:  NIFEREX Take 1 capsule (150 mg  total) by mouth daily.   metoprolol tartrate 25 MG tablet Commonly known as:  LOPRESSOR Take 1 tablet (25 mg total) by mouth 2 (two) times daily.   potassium chloride SA 20 MEQ tablet Commonly known as:  K-DUR,KLOR-CON Take 1 tablet (20 mEq total) by mouth daily. For 7 days   PREVAGEN PO Take 1 tablet by mouth daily. For Brain and Memory Health - OTC   simvastatin 20 MG tablet Commonly known as:  ZOCOR Take 20 mg by mouth at  bedtime.   traMADol 50 MG tablet Commonly known as:  ULTRAM Take 1 tablet (50 mg total) by mouth every 6 (six) hours as needed for moderate pain.   VISINE OP Place 1 drop into both eyes daily as needed (dry eyes).       Contact information for follow-up providers    Rexene Alberts, MD Follow up.   Specialty:  Cardiothoracic Surgery Contact information: [address] [place] [address] [phone]        [name], PA-C Follow up on 12/26/2016.   Specialties:  Cardiology, Physician Assistant Why:  Appointment is at 9:15 Contact information: [address] [address] [phone]        [place]. Schedule an appointment as soon as possible for a visit.   Why:  Please call to schedule 1 week follow up for bleeding from foley removal Contact information: [place] [phone]           Contact information for after-discharge care    Destination    [facility] Follow up.   Specialty:  Skilled Nursing Facility Contact information: [place] [phone]                  Signed: Ellwood Handler 12/09/2016, 1:32 PM

## 2016-12-08 LAB — CBC
HCT: 23.4 % — ABNORMAL LOW (ref 39.0–52.0)
Hemoglobin: 7.8 g/dL — ABNORMAL LOW (ref 13.0–17.0)
MCH: 29.5 pg (ref 26.0–34.0)
MCHC: 33.3 g/dL (ref 30.0–36.0)
MCV: 88.6 fL (ref 78.0–100.0)
Platelets: 165 10*3/uL (ref 150–400)
RBC: 2.64 MIL/uL — ABNORMAL LOW (ref 4.22–5.81)
RDW: 15 % (ref 11.5–15.5)
WBC: 7.2 10*3/uL (ref 4.0–10.5)

## 2016-12-08 LAB — BASIC METABOLIC PANEL
Anion gap: 8 (ref 5–15)
BUN: 16 mg/dL (ref 6–20)
CALCIUM: 8.6 mg/dL — AB (ref 8.9–10.3)
CO2: 23 mmol/L (ref 22–32)
Chloride: 111 mmol/L (ref 101–111)
Creatinine, Ser: 1.46 mg/dL — ABNORMAL HIGH (ref 0.61–1.24)
GFR calc non Af Amer: 45 mL/min — ABNORMAL LOW (ref >60)
GFR, EST AFRICAN AMERICAN: 52 mL/min — AB (ref >60)
Glucose, Bld: 109 mg/dL — ABNORMAL HIGH (ref 65–99)
Potassium: 3.8 mmol/L (ref 3.5–5.1)
Sodium: 142 mmol/L (ref 135–145)

## 2016-12-08 LAB — URINE CULTURE
CULTURE: NO GROWTH
SPECIAL REQUESTS: NORMAL

## 2016-12-08 MED ORDER — POTASSIUM CHLORIDE CRYS ER 20 MEQ PO TBCR
20.0000 meq | EXTENDED_RELEASE_TABLET | Freq: Every day | ORAL | Status: DC
Start: 2016-12-08 — End: 2016-12-09
  Administered 2016-12-08 – 2016-12-09 (×2): 20 meq via ORAL
  Filled 2016-12-08 (×2): qty 1

## 2016-12-08 MED FILL — Heparin Sodium (Porcine) Inj 1000 Unit/ML: INTRAMUSCULAR | Qty: 2500 | Status: AC

## 2016-12-08 NOTE — Progress Notes (Addendum)
      [address]       [place] [address]             [phone]      6 Days Post-Op Procedure(s) (LRB): Resection of left atrial mass  (N/A) TRANSESOPHAGEAL ECHOCARDIOGRAM (TEE) (N/A) AORTIC VALVE REPLACEMENT (AVR) using a 23 [employer] Intuity Elite Aortic Valve (N/A)  Subjective:  No new complaints.  Continues to feel well.  + ambulation with assistance  + BM  Objective: Vital signs in last 24 hours: Temp:  [98.1 F (36.7 C)-98.5 F (36.9 C)] 98.1 F (36.7 C) (04/26 0300) Pulse Rate:  [66-84] 72 (04/26 0300) Cardiac Rhythm: Ventricular paced (04/26 0700) Resp:  [17-18] 18 (04/26 0300) BP: (125-155)/(56-73) 135/56 (04/26 0300) SpO2:  [97 %-99 %] 99 % (04/26 0300) Weight:  [216 lb 8 oz (98.2 kg)] 216 lb 8 oz (98.2 kg) (04/26 0300)  Intake/Output from previous day: 04/25 0701 - 04/26 0700 In: 723 [P.O.:720; I.V.:3] Out: 200 [Urine:200]  General appearance: alert, cooperative and no distress Heart: regular rate and rhythm and paced Lungs: clear to auscultation bilaterally Abdomen: soft, non-tender; bowel sounds normal; no masses,  no organomegaly Extremities: edema trace Wound: clean and dry  Lab Results:  Recent Labs  12/06/16 0208 12/08/16 0428  WBC 9.7 7.2  HGB 7.7* 7.8*  HCT 22.6* 23.4*  PLT 144* 165   BMET:  Recent Labs  12/06/16 0208 12/08/16 0428  NA 142 142  K 4.3 3.8  CL 111 111  CO2 25 23  GLUCOSE 116* 109*  BUN 20 16  CREATININE 1.61* 1.46*  CALCIUM 8.6* 8.6*    PT/INR: No results for input(s): LABPROT, INR in the last 72 hours. ABG    Component Value Date/Time   PHART 7.313 (L) 12/02/2016 2316   HCO3 23.5 12/02/2016 2316   TCO2 24 12/03/2016 1650   ACIDBASEDEF 3.0 (H) 12/02/2016 2316   O2SAT 91.0 12/02/2016 2316   CBG (last 3)  No results for input(s): GLUCAP in the last 72 hours.  Assessment/Plan: S/P Procedure(s) (LRB): Resection of left atrial mass  (N/A) TRANSESOPHAGEAL ECHOCARDIOGRAM (TEE)  (N/A) AORTIC VALVE REPLACEMENT (AVR) using a 23 [employer] Intuity Elite Aortic Valve (N/A)  1. CV- Paced rhythm- continue Lopressor, Norvasc 2. Pulm- off oxygen, continue IS 3. Renal- creatinine continues to trend down, at 1.46, weight continues to trend down, weight is 6 lbs below baseline, ? D/c lasix 4. Expected post operative anemia- Hgb is stable at 7.8, baseline was 9.0 prior to admission, continue iron 5. Neuro- mild dementia, confusion stable, safety sitter d/c'd this morning 6. Dispo- patient stable, continue Lopressor, Norvasc, SNF placement is arranged, likely ready for d/c in Am if remains stable   LOS: 6 days    BARRETT, ERIN 12/08/2016  I have seen and examined the patient and agree with the assessment and plan as outlined.  Plan d/c to SNF tomorrow  Rexene Alberts, MD 12/08/2016

## 2016-12-08 NOTE — Progress Notes (Signed)
Spoke with nursing this evening about this patient having urethral bleeding on urination. The bleeding was initally only when the foley was removed two days ago but started again. Since the patient is being discharged tomorrow morning I wanted him to have follow-up with a urologist regarding the bleeding. I spoke to Dr. Karsten Ro who believes this to be urethral trauma that it will resolve on its own. If the bleeding become continuous and copious he suggested to place a foley catheter back in to tamponade the bleeding. I mentioned the low hemoglobin and he did not think the bleeding should affect it. He will follow-up in his office next week but believes the bleeding should resolve on its own.   We will arrange follow-up tomorrow when discharged. The plan of care was shared with nursing.   Nicholes Rough, PA-C

## 2016-12-08 NOTE — Progress Notes (Signed)
Pt had another episode of bleeding from his urethra after voiding. There was no blood in the urine just multiple drops of bright red blood discharged on the floor as the patient returned from the bathroom. Bleeding stopped on its own after patient returned to bed.

## 2016-12-08 NOTE — Progress Notes (Signed)
Clinical Social Worker is still following patients and family for support and any discharge needs.  Rhea Pink, MSW,  [place]

## 2016-12-08 NOTE — Progress Notes (Signed)
CARDIAC REHAB PHASE I   PRE:  Rate/Rhythm: 86 paced  BP:  Sitting: 120/80        SaO2: 99 RA  MODE:  Ambulation: 490 ft   POST:  Rate/Rhythm: 93 paced  BP:  Sitting: 137/66         SaO2: 96 RA  Pt ambulated 490 ft on RA, rolling walker, assist x1, mostly steady gait, tolerated well with no complaints. Encouraged IS, additional ambulation x2 today. Pt to recliner after walk, feet elevated, call bell within reach, chair alarm on. Will follow.   [id number] Lenna Sciara, RN, BSN 12/08/2016 11:18 AM

## 2016-12-09 DIAGNOSIS — Z953 Presence of xenogenic heart valve: Secondary | ICD-10-CM | POA: Diagnosis not present

## 2016-12-09 DIAGNOSIS — Z48812 Encounter for surgical aftercare following surgery on the circulatory system: Secondary | ICD-10-CM | POA: Diagnosis not present

## 2016-12-09 DIAGNOSIS — R488 Other symbolic dysfunctions: Secondary | ICD-10-CM | POA: Diagnosis not present

## 2016-12-09 DIAGNOSIS — R41841 Cognitive communication deficit: Secondary | ICD-10-CM | POA: Diagnosis not present

## 2016-12-09 DIAGNOSIS — D62 Acute posthemorrhagic anemia: Secondary | ICD-10-CM | POA: Diagnosis not present

## 2016-12-09 DIAGNOSIS — Z952 Presence of prosthetic heart valve: Secondary | ICD-10-CM | POA: Diagnosis not present

## 2016-12-09 DIAGNOSIS — T839XXS Unspecified complication of genitourinary prosthetic device, implant and graft, sequela: Secondary | ICD-10-CM | POA: Diagnosis not present

## 2016-12-09 DIAGNOSIS — Z8673 Personal history of transient ischemic attack (TIA), and cerebral infarction without residual deficits: Secondary | ICD-10-CM | POA: Diagnosis not present

## 2016-12-09 DIAGNOSIS — I442 Atrioventricular block, complete: Secondary | ICD-10-CM | POA: Diagnosis not present

## 2016-12-09 DIAGNOSIS — E785 Hyperlipidemia, unspecified: Secondary | ICD-10-CM | POA: Diagnosis not present

## 2016-12-09 DIAGNOSIS — E118 Type 2 diabetes mellitus with unspecified complications: Secondary | ICD-10-CM | POA: Diagnosis not present

## 2016-12-09 DIAGNOSIS — R262 Difficulty in walking, not elsewhere classified: Secondary | ICD-10-CM | POA: Diagnosis not present

## 2016-12-09 DIAGNOSIS — I519 Heart disease, unspecified: Secondary | ICD-10-CM | POA: Diagnosis not present

## 2016-12-09 DIAGNOSIS — I10 Essential (primary) hypertension: Secondary | ICD-10-CM | POA: Diagnosis not present

## 2016-12-09 DIAGNOSIS — R531 Weakness: Secondary | ICD-10-CM | POA: Diagnosis not present

## 2016-12-09 DIAGNOSIS — F0390 Unspecified dementia without behavioral disturbance: Secondary | ICD-10-CM | POA: Diagnosis not present

## 2016-12-09 DIAGNOSIS — N183 Chronic kidney disease, stage 3 (moderate): Secondary | ICD-10-CM | POA: Diagnosis not present

## 2016-12-09 DIAGNOSIS — D500 Iron deficiency anemia secondary to blood loss (chronic): Secondary | ICD-10-CM | POA: Diagnosis not present

## 2016-12-09 DIAGNOSIS — M6281 Muscle weakness (generalized): Secondary | ICD-10-CM | POA: Diagnosis not present

## 2016-12-09 DIAGNOSIS — E8779 Other fluid overload: Secondary | ICD-10-CM | POA: Diagnosis not present

## 2016-12-09 DIAGNOSIS — H04123 Dry eye syndrome of bilateral lacrimal glands: Secondary | ICD-10-CM | POA: Diagnosis not present

## 2016-12-09 MED ORDER — FUROSEMIDE 40 MG PO TABS: 40.0000 mg | ORAL_TABLET | Freq: Every day | ORAL | Status: DC

## 2016-12-09 MED ORDER — FA-PYRIDOXINE-CYANOCOBALAMIN 2.5-25-2 MG PO TABS: 1.0000 | ORAL_TABLET | Freq: Every day | ORAL | Status: DC

## 2016-12-09 MED ORDER — TRAMADOL HCL 50 MG PO TABS: 50.0000 mg | ORAL_TABLET | Freq: Four times a day (QID) | ORAL | 0 refills | Status: DC | PRN

## 2016-12-09 MED ORDER — METOPROLOL TARTRATE 25 MG PO TABS: 25.0000 mg | ORAL_TABLET | Freq: Two times a day (BID) | ORAL | Status: DC

## 2016-12-09 MED ORDER — POLYSACCHARIDE IRON COMPLEX 150 MG PO CAPS: 150.0000 mg | ORAL_CAPSULE | Freq: Every day | ORAL | Status: DC

## 2016-12-09 MED ORDER — POTASSIUM CHLORIDE CRYS ER 20 MEQ PO TBCR: 20.0000 meq | EXTENDED_RELEASE_TABLET | Freq: Every day | ORAL | Status: DC

## 2016-12-09 NOTE — Consult Note (Signed)
   [facility] CM Inpatient Consult   12/09/2016  Cordney Barstow 1941-04-14 [id number]   Patient evaluated for re-admission in the Medicare ACO.  Chart indicates the patient will discharge to skilled nursing facility today.  Chart review that the patient is a 76 year old African-American male with known history of heart murmur, long-standing hypertension, hyperlipidemia, tobacco abuse, diet-controlled type 2 diabetes mellitus, and mild dementia who presented with left atrial mass and is now s/p TRANSESOPHAGEAL ECHOCARDIOGRAM (TEE) AORTIC VALVE REPLACEMENT. Patient is requiring 24 hour care post hospitalization.  Patient to discharge to [facility]. No [facility] Care Management needs noted for community follow up at this time.  Patient's transition of care call will be followed by Dr. [name] of [facility] .  For questions or referrals please contact:  Natividad Brood, RN BSN [facility] Liaison  [phone] business mobile phone Toll free office [phone]

## 2016-12-09 NOTE — Progress Notes (Signed)
Physical Therapy Treatment Patient Details Name: James Curtis MRN: [id number] DOB: Mar 27, 1941 Today's Date: 12/09/2016    History of Present Illness 76 year old African-American male with known history of heart murmur, long-standing hypertension, hyperlipidemia, tobacco abuse, diet-controlled type 2 diabetes mellitus, and mild dementia who presented with left atrial mass and is now s/p TRANSESOPHAGEAL ECHOCARDIOGRAM (TEE) AORTIC VALVE REPLACEMENT.    PT Comments    Pt pleasantly confused and unable to recall precautions but does maintain them with cues and supervision. Pt with continued increasing gait distance without distress or elevated HR. Pt educated for transfers, precautions with handout provided and HEP.     Follow Up Recommendations  SNF;Supervision/Assistance - 24 hour (due to pt plan PTA and lack of 24hr care)     Equipment Recommendations  None recommended by PT    Recommendations for Other Services       Precautions / Restrictions Precautions Precautions: Sternal;Fall Precaution Comments: pt unable to recall precautions before or after session    Mobility  Bed Mobility               General bed mobility comments: in chair on arrival  Transfers Overall transfer level: Needs assistance   Transfers: Sit to/from Stand Sit to Stand: Min guard         General transfer comment: cues for sequence, scooting and hand placement  Ambulation/Gait Ambulation/Gait assistance: Min guard Ambulation Distance (Feet): 550 Feet Assistive device: Rolling walker (2 wheeled) Gait Pattern/deviations: Step-through pattern;Decreased stride length   Gait velocity interpretation: Below normal speed for age/gender General Gait Details: cues for position in RW, safety and directional cues, pt easily distracted at times   Stairs            Wheelchair Mobility    Modified Rankin (Stroke Patients Only)       Balance                                            Cognition Arousal/Alertness: Awake/alert Behavior During Therapy: Flat affect Overall Cognitive Status: Impaired/Different from baseline Area of Impairment: Memory;Safety/judgement                     Memory: Decreased recall of precautions;Decreased short-term memory Following Commands: Follows one step commands consistently Safety/Judgement: Decreased awareness of deficits   Problem Solving: Slow processing General Comments: pt unable to recall precautions or room number despite education throughout session      Exercises General Exercises - Lower Extremity Long Arc Quad: AROM;Both;Seated;15 reps Hip Flexion/Marching: AROM;Both;Seated;15 reps    General Comments        Pertinent Vitals/Pain Pain Assessment: No/denies pain    Home Living                      Prior Function            PT Goals (current goals can now be found in the care plan section) Progress towards PT goals: Progressing toward goals    Frequency           PT Plan Current plan remains appropriate    Co-evaluation             End of Session Equipment Utilized During Treatment: Gait belt Activity Tolerance: Patient tolerated treatment well Patient left: in chair;with call bell/phone within reach;with family/visitor present;with chair alarm set Nurse Communication: Mobility status PT  Visit Diagnosis: Unsteadiness on feet (R26.81)     Time: 3462-1947 PT Time Calculation (min) (ACUTE ONLY): 31 min  Charges:  $Gait Training: 8-22 mins $Therapeutic Exercise: 8-22 mins                    G Codes:       Elwyn Reach, PT [phone]  [place] 12/09/2016, 8:24 AM

## 2016-12-09 NOTE — Care Management Note (Signed)
Case Management Note Marvetta Gibbons RN, BSN Unit 2W-Case Manager [phone]  Patient Details  Name: James Curtis MRN: [id number] Date of Birth: 1940-08-28  Subjective/Objective:  Pt admitted s/p AVR with  Resection of left atrial mass                   Action/Plan: PTA pt lived at home with son, PT eval recommending SNF vs home with [facility] pending on what family can provide for support- CSW consulted for possible SNF- CM will continue to follow for d/c needs  Expected Discharge Date:  12/09/16               Expected Discharge Plan:  [place]  In-House Referral:  Clinical Social Work  Discharge planning Services  CM Consult  Post Acute Care Choice:    Choice offered to:     DME Arranged:    DME Agency:     HH Arranged:    [place] Agency:     Status of Service:  Completed, signed off  If discussed at [employer], dates discussed:    Discharge Disposition: skilled facility   Additional Comments:  12/09/16- 0950- Marvetta Gibbons RN, CM- pt for d/c today to SNF- CSW following for placement needs-   Dahlia Client, Romeo Rabon, RN 12/09/2016, 9:52 AM

## 2016-12-09 NOTE — Progress Notes (Addendum)
      [address]       ,[place] [address]             [phone]      7 Days Post-Op Procedure(s) (LRB): Resection of left atrial mass  (N/A) TRANSESOPHAGEAL ECHOCARDIOGRAM (TEE) (N/A) AORTIC VALVE REPLACEMENT (AVR) using a 23 [employer] Intuity Elite Aortic Valve (N/A)   Subjective:  Mr. Fauth states he is not doing well, due to bleeding from his penis.  I explained that this is a result of trauma from him removing his own foley.   Objective: Vital signs in last 24 hours: Temp:  [98.3 F (36.8 C)-98.9 F (37.2 C)] 98.9 F (37.2 C) (04/27 0421) Pulse Rate:  [70-79] 73 (04/27 0421) Cardiac Rhythm: Ventricular paced (04/27 0700) Resp:  [17-20] 20 (04/27 0421) BP: (135-154)/(69-72) 154/72 (04/27 0421) SpO2:  [95 %-100 %] 96 % (04/27 0421) Weight:  [215 lb 6.4 oz (97.7 kg)] 215 lb 6.4 oz (97.7 kg) (04/27 0421)  Intake/Output from previous day: 04/26 0701 - 04/27 0700 In: 600 [P.O.:600] Out: 650 [Urine:650]  General appearance: alert, cooperative and no distress Heart: regular rate and rhythm and paced Lungs: clear to auscultation bilaterally Abdomen: soft, non-tender; bowel sounds normal; no masses,  no organomegaly Extremities: edema trace Wound: clean and dry  Lab Results:  Recent Labs  12/08/16 0428  WBC 7.2  HGB 7.8*  HCT 23.4*  PLT 165   BMET:  Recent Labs  12/08/16 0428  NA 142  K 3.8  CL 111  CO2 23  GLUCOSE 109*  BUN 16  CREATININE 1.46*  CALCIUM 8.6*    PT/INR: No results for input(s): LABPROT, INR in the last 72 hours. ABG    Component Value Date/Time   PHART 7.313 (L) 12/02/2016 2316   HCO3 23.5 12/02/2016 2316   TCO2 24 12/03/2016 1650   ACIDBASEDEF 3.0 (H) 12/02/2016 2316   O2SAT 91.0 12/02/2016 2316   CBG (last 3)  No results for input(s): GLUCAP in the last 72 hours.  Assessment/Plan: S/P Procedure(s) (LRB): Resection of left atrial mass  (N/A) TRANSESOPHAGEAL ECHOCARDIOGRAM (TEE) (N/A) AORTIC VALVE  REPLACEMENT (AVR) using a 23 [employer] Intuity Elite Aortic Valve (N/A)  1. CV- Paced, rhythm remains hemodynamically stable- continue Lopressor, Norvasc 2. Pulm- no acute issues, continue IS 3. Renal- weight is trending down, will plan to taper lasix off over next week 4. GU- Bleeding from penis, due to trauma from foley removal, not constant per nursing, will f/u with urology in 1 week 5. Dispo- patient stable, will d/c to SNF today if bed available   LOS: 7 days    BARRETT, ERIN 12/09/2016  I have seen and examined the patient and agree with the assessment and plan as outlined.  Rexene Alberts, MD 12/09/2016 11:15 AM

## 2016-12-09 NOTE — Progress Notes (Signed)
1205-1236 Education completed with pt and son. Understanding voiced by son who will re enforce ed with pt. Discussed with pt need to not smoke. Encouraged walking with asst and using IS. Discussed sternal precautions and encouraged watching sodium. Discussed CRP 2 and will refer to [facility] program. Written ex and diet given to son. Graylon Good RN BSN 12/09/2016 12:34 PM

## 2016-12-09 NOTE — Progress Notes (Signed)
Clinical Social Worker facilitated patient discharge including contacting patient family and facility to confirm patient discharge plans.  Clinical information faxed to facility and family agreeable with plan.  CSW spoke to patients son [name]). [name] stated he will transport patient to [facility] himself .  RN Nego to call [phone] (pt will be going in room 407p)report prior to discharge.  Clinical Social Worker will sign off for now as social work intervention is no longer needed. Please consult [place] again if new need arises.  Rhea Pink, MSW, [place]

## 2016-12-12 ENCOUNTER — Encounter: Payer: Self-pay | Admitting: Adult Health

## 2016-12-12 ENCOUNTER — Non-Acute Institutional Stay (SKILLED_NURSING_FACILITY): Payer: Medicare Other | Admitting: Adult Health

## 2016-12-12 DIAGNOSIS — D62 Acute posthemorrhagic anemia: Secondary | ICD-10-CM | POA: Diagnosis not present

## 2016-12-12 DIAGNOSIS — F0390 Unspecified dementia without behavioral disturbance: Secondary | ICD-10-CM | POA: Diagnosis not present

## 2016-12-12 DIAGNOSIS — N183 Chronic kidney disease, stage 3 unspecified: Secondary | ICD-10-CM

## 2016-12-12 DIAGNOSIS — I10 Essential (primary) hypertension: Secondary | ICD-10-CM | POA: Diagnosis not present

## 2016-12-12 DIAGNOSIS — I519 Heart disease, unspecified: Secondary | ICD-10-CM | POA: Diagnosis not present

## 2016-12-12 DIAGNOSIS — G451 Carotid artery syndrome (hemispheric): Secondary | ICD-10-CM

## 2016-12-12 DIAGNOSIS — R531 Weakness: Secondary | ICD-10-CM

## 2016-12-12 DIAGNOSIS — T839XXS Unspecified complication of genitourinary prosthetic device, implant and graft, sequela: Secondary | ICD-10-CM

## 2016-12-12 DIAGNOSIS — H04123 Dry eye syndrome of bilateral lacrimal glands: Secondary | ICD-10-CM | POA: Diagnosis not present

## 2016-12-12 DIAGNOSIS — E785 Hyperlipidemia, unspecified: Secondary | ICD-10-CM | POA: Diagnosis not present

## 2016-12-12 DIAGNOSIS — E8779 Other fluid overload: Secondary | ICD-10-CM | POA: Diagnosis not present

## 2016-12-12 DIAGNOSIS — E118 Type 2 diabetes mellitus with unspecified complications: Secondary | ICD-10-CM | POA: Diagnosis not present

## 2016-12-12 DIAGNOSIS — I5189 Other ill-defined heart diseases: Secondary | ICD-10-CM

## 2016-12-12 DIAGNOSIS — I442 Atrioventricular block, complete: Secondary | ICD-10-CM

## 2016-12-12 NOTE — Progress Notes (Signed)
DATE:  12/12/2016   MRN:  [id number]  BIRTHDAY: 11/18/1940  Facility:  Nursing Home Location:  [place] Room Number: 407-P  LEVEL OF CARE:  SNF (31)  Contact Information    Name Relation Home Work Mobile   [place] Son   [phone]   [place], Gil   [phone]       Code Status History    Date Active Date Inactive Code Status Order ID Comments User Context   12/02/2016  2:05 PM 12/09/2016  5:50 PM Full Code [id number]  Rexene Alberts, MD Inpatient   11/18/2016  8:46 PM 11/26/2016  6:08 PM Full Code [id number]  Leanor Kail, PA Inpatient       Chief Complaint  Patient presents with  . Hospitalization Follow-up    HISTORY OF PRESENT ILLNESS:  This is a 76-YO male admitted to [facility] for short-term rehabilitation on 12/09/2016 following an admission at [facility] 12/02/2016-12/09/2016 for resection of left atrial mass and aortic valve replacement with a 23 mm Percell Miller Intuity rapid deployment pericardial tissue valve on 12/02/16. He developed confusion, agitation and delirium post-op. He pulled his foley catheter and 3 of 4 chest tubes out. Minor bleeding from foley removal. ZFollow-up CXR showed no evidence of pneumothorax.  On post-op day 2, he has his PPM placed.  He developed mild elevation in his creatinine. He had mild hypervolemia and was diuresed with Lasix.  He has a PMH of type II DM, stage III CKD, non-obstructive CAD, renal mass of unknown nature, complete heart block, essential hypertension, and cardiac pacemaker in situ.    He was seen in the room today and did not verbalize any complaints.    PAST MEDICAL HISTORY:  Past Medical History:  Diagnosis Date  . Aortic stenosis    a. 11/20/2016 Echo: mod AS;  b. 11/23/2016 TEE restricted mobility of noncoronary cusp;  c. 11/2016 Cath: mean grad 42mmHg w/ dual lumen catheter, AVA 2.0cm^2; mean gradient by pullback = 42mmHg, AVA 2.3cm^2.  . Carotid  arterial disease ([place])    a. 11/2016 Carotid U/S: bilat 1-39% ICA stenosis.  . Chronic kidney disease   . Complete heart block ([place])    a. 11/21/2016 s/p MDT Santina Evans DR MRI SureScan (ser # [id number]).  . Dementia    mild  . Family history of adverse reaction to anesthesia    Son [name] had a difficult time waking up  . Heart murmur   . Hypercholesteremia   . Hypertension    benign  . Incidental pulmonary nodule, greater than or equal to 58mm - needs f/u scan 3 months 11/28/2016   14 x 8 mm (mean diameter of 11 mm) left lower lobe pulmonary nodule with some surrounding ground-glass attenuation (axial image 42 of series 6). 9 x 5 mm (mean diameter of 7 mm) subpleural nodule in the periphery of the right upper lobe (axial image 20 of series 6). A few patchy areas of peripheral predominant ground-glass attenuation and subpleural reticulation are noted, scattered randomly throu  . Left atrial mass   . 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 11/2009 TEE: EF 60-65%, restricted mobility of noncoronary AoV cusp, 16x4 mm mobile Ca2+ echodensity on the atrial basal surface of the posterior MV leaflet, mild MR, no LA/LAA thrombus.  . Non-obstructive CAD (coronary artery disease)    a. 11/2016 Cath: LM large, nl, LAD min irregs, LCX large, min irregs, OM3 40, RCA large,  20m,   . Pre-diabetes   . Prostate cancer [facility])    s/p radiation  . Renal mass of unknown nature - needs f/u MRI 11/28/2016   Multiple renal lesions are noted in the kidneys bilaterally. Although several of these lesions are low-attenuation, compatible with cysts, other lesions are intermediate to high attenuation, potentially enhancing  . S/P aortic valve replacement with bioprosthetic valve 12/02/2016   23 mm Edwards Intuity rapid deployment bovine pericardial tissue valve  . S/P resection of left atrial mass 12/02/2016   Benign dystrophic fibrotic tissue with calcification attached to posterior mitral annulus by thin fibrotic stalk    . TIA (transient ischemic attack)    a. 11/2016 in setting of complete heart block.     CURRENT MEDICATIONS: Reviewed  Patient's Medications  New Prescriptions   No medications on file  Previous Medications   AMLODIPINE (NORVASC) 10 MG TABLET    Take 10 mg by mouth daily.   APOAEQUORIN (PREVAGEN PO)    Take 1 tablet by mouth daily. For Brain and Memory Health - OTC   ASPIRIN EC 325 MG TABLET    Take 1 tablet (325 mg total) by mouth daily.   CHOLECALCIFEROL (VITAMIN D) 1000 UNITS TABLET    Take 1,000 Units by mouth daily.   FOLIC ACID-PYRIDOXINE-CYANCOBALAMIN (FOLTX) 2.5-25-2 MG TABS TABLET    Take 1 tablet by mouth daily.   FUROSEMIDE (LASIX) 40 MG TABLET    Take 1 tablet (40 mg total) by mouth daily. For 7 days   HYDROXYPROPYL METHYLCELLULOSE / HYPROMELLOSE (ISOPTO TEARS / GONIOVISC) 2.5 % OPHTHALMIC SOLUTION    Place 1 drop into both eyes daily as needed for dry eyes.   IRON POLYSACCHARIDES (NIFEREX) 150 MG CAPSULE    Take 1 capsule (150 mg total) by mouth daily.   METOPROLOL TARTRATE (LOPRESSOR) 25 MG TABLET    Take 1 tablet (25 mg total) by mouth 2 (two) times daily.   POTASSIUM CHLORIDE SA (K-DUR,KLOR-CON) 20 MEQ TABLET    Take 1 tablet (20 mEq total) by mouth daily. For 7 days   SIMVASTATIN (ZOCOR) 20 MG TABLET    Take 20 mg by mouth at bedtime.    TRAMADOL (ULTRAM) 50 MG TABLET    Take 1 tablet (50 mg total) by mouth every 6 (six) hours as needed for moderate pain.  Modified Medications   No medications on file  Discontinued Medications   TETRAHYDROZOLINE HCL (VISINE OP)    Place 1 drop into both eyes daily as needed (dry eyes).     No Known Allergies   REVIEW OF SYSTEMS:  GENERAL: no change in appetite, no fatigue, no weight changes, no fever, chills or weakness EYES: Denies change in vision, dry eyes, eye pain, itching or discharge EARS: Denies change in hearing, ringing in ears, or earache NOSE: Denies nasal congestion or epistaxis MOUTH and THROAT: Denies oral  discomfort, gingival pain or bleeding, pain from teeth or hoarseness   RESPIRATORY: no cough, SOB, DOE, wheezing, hemoptysis CARDIAC: no chest pain, edema or palpitations GI: no abdominal pain, diarrhea, constipation, heart burn, nausea or vomiting GU: Denies dysuria, frequency, hematuria, incontinence, or discharge PSYCHIATRIC: Denies feeling of depression or anxiety. No report of hallucinations, insomnia, paranoia, or agitation     PHYSICAL EXAMINATION  GENERAL APPEARANCE: Well nourished. In no acute distress. Obese SKIN:  Chest midline surgical incision is dry, no erythema, covered with dry dressing HEAD: Normal in size and contour. No evidence of trauma EYES: Lids open and close  normally. No blepharitis, entropion or ectropion. PERRL. Conjunctivae are clear and sclerae are white. Lenses are without opacity EARS: Pinnae are normal. Patient hears normal voice tunes of the examiner MOUTH and THROAT: Lips are without lesions. Oral mucosa is moist and without lesions. Tongue is normal in shape, size, and color and without lesions NECK: supple, trachea midline, no neck masses, no thyroid tenderness, no thyromegaly LYMPHATICS: no LAN in the neck, no supraclavicular LAN RESPIRATORY: breathing is even & unlabored, BS CTAB CARDIAC: RRR, no murmur,no extra heart sounds, no edema GI: abdomen soft, normal BS, no masses, no tenderness, no hepatomegaly, no splenomegaly EXTREMITIES;  Able to move X 4 extremities PSYCHIATRIC: Alert and oriented X 3. Affect and behavior are appropriate  LABS/RADIOLOGY: Labs reviewed: Basic Metabolic Panel:  Recent Labs  12/02/16 1800  12/03/16 0400  12/03/16 1651  12/05/16 0428 12/06/16 0208 12/08/16 0428  NA  --   < > 142  < >  --   < > 141 142 142  K  --   < > 3.8  < >  --   < > 3.6 4.3 3.8  CL  --   < > 110  < >  --   < > 110 111 111  CO2  --   --  23  --   --   < > 25 25 23   GLUCOSE  --   < > 126*  < >  --   < > 79 116* 109*  BUN  --   < > 14  < >   --   < > 24* 20 16  CREATININE 1.25*  < > 1.38*  < > 1.64*  < > 1.62* 1.61* 1.46*  CALCIUM  --   --  8.1*  --   --   < > 8.5* 8.6* 8.6*  MG 3.0*  --  2.9*  --  2.5*  --   --   --   --   < > = values in this interval not displayed. Liver Function Tests:  Recent Labs  11/18/16 1156 12/02/16 0617 12/05/16 0428  AST 24 27 27   ALT 15* 34 18  ALKPHOS 65 68 47  BILITOT 1.3* 0.6 0.6  PROT 6.9 6.8 5.0*  ALBUMIN 3.9 4.3 2.9*   CBC:  Recent Labs  11/18/16 1156  12/05/16 0428 12/06/16 0208 12/08/16 0428  WBC 7.5  < > 12.4* 9.7 7.2  NEUTROABS 5.7  --   --   --   --   HGB 11.8*  < > 7.9* 7.7* 7.8*  HCT 35.1*  < > 23.1* 22.6* 23.4*  MCV 87.5  < > 86.2 87.3 88.6  PLT 176  < > 125* 144* 165  < > = values in this interval not displayed. Lipid Panel:  Recent Labs  11/19/16 0323  HDL 27*   CBG:  Recent Labs  12/05/16 0407 12/05/16 0748 12/05/16 0806  GLUCAP 74 66 75      Dg Chest 2 View  Result Date: 12/06/2016 CLINICAL DATA:  77 year old male with a history of shortness of breath. Recent aortic valve replacement EXAM: CHEST  2 VIEW COMPARISON:  Multiple prior chest x-ray, most recent 12/05/2016. Prior CT 11/26/2016 FINDINGS: Cardiomediastinal silhouette relatively unchanged in size and contour. Calcifications of the aortic arch. Surgical changes of median sternotomy and aortic valve replacement. Unchanged cardiac pacing device on left chest wall. Interval removal of right IJ sheath. Compared to the preoperative x-ray of 11/22/2016, there is relatively  similar reticulonodular opacity at the bilateral lung bases, with low lung volumes. No large pleural effusion or pneumothorax. No interlobular septal thickening. IMPRESSION: Compared to the preoperative chest x-ray, there is relatively similar appearance of reticulonodular opacity, most pronounced at the lung bases with low lung volumes. Findings favored to represent atelectasis and chronic lung fibrosis/scarring, however, mild edema  and/or developing infection cannot be excluded. Surgical changes of median sternotomy and aortic valve replacement. Aortic atherosclerosis. Interval removal of right IJ sheath. Electronically Signed   By: Corrie Mckusick D.O.   On: 12/06/2016 08:11   Dg Chest 2 View  Result Date: 11/22/2016 CLINICAL DATA:  Pacer placement EXAM: CHEST  2 VIEW COMPARISON:  None. FINDINGS: Placement of left pacer with leads in the right atrium and right ventricle. No pneumothorax. Heart is upper limits normal in size. Low lung volumes with bibasilar atelectasis. No effusions. IMPRESSION: Left pacer placement without pneumothorax. Bibasilar atelectasis. Electronically Signed   By: Rolm Baptise M.D.   On: 11/22/2016 07:19   Mr Virgel Paling KP Contrast  Result Date: 11/19/2016 CLINICAL DATA:  76 y/o M; episode of slurred speech and right facial droop with onset today. EXAM: MRI HEAD WITHOUT CONTRAST MRA HEAD WITHOUT CONTRAST TECHNIQUE: Multiplanar, multiecho pulse sequences of the brain and surrounding structures were obtained without intravenous contrast. Angiographic images of the head were obtained using MRA technique without contrast. COMPARISON:  08/11/2016 MRI of the brain. FINDINGS: MRI HEAD FINDINGS Moderate motion artifact degrades several sequences. Brain: No acute infarction, hemorrhage, hydrocephalus, extra-axial collection or mass lesion. Stable mild chronic microvascular ischemic changes and mild parenchymal volume loss of the brain. Stable small chronic lacunar infarct within left hemi pons. Vascular: As below. Skull and upper cervical spine: Normal marrow signal. Sinuses/Orbits: Negative. Other: None. MRA HEAD FINDINGS Internal carotid arteries: Patent. Lumen irregularity of bilateral cavernous and paraclinoid segments without high-grade stenosis is likely related to atherosclerotic calcification. Anterior cerebral arteries:  Patent. Middle cerebral arteries: Patent. Anterior communicating artery: Patent. Posterior  communicating arteries: Probable diminutive left. No right identified, likely hypoplastic or absent. Posterior cerebral arteries:  Patent. Basilar artery:  Patent. Vertebral arteries:  Patent. No high-grade stenosis or aneurysm identified. Mild motion artifact, suboptimal assessment for subtle stenosis or small 2-3 mm aneurysm. IMPRESSION: 1. No acute intracranial abnormality identified. 2. Stable mild chronic microvascular ischemic changes and mild parenchymal volume loss of the brain. Stable small chronic lacunar infarct in left hemi pons. 3. Patent circle of Willis. No high-grade stenosis, large vessel occlusion, or aneurysm identified. Electronically Signed   By: Kristine Garbe M.D.   On: 11/19/2016 00:33   Mr Brain Wo Contrast  Result Date: 11/19/2016 CLINICAL DATA:  76 y/o M; episode of slurred speech and right facial droop with onset today. EXAM: MRI HEAD WITHOUT CONTRAST MRA HEAD WITHOUT CONTRAST TECHNIQUE: Multiplanar, multiecho pulse sequences of the brain and surrounding structures were obtained without intravenous contrast. Angiographic images of the head were obtained using MRA technique without contrast. COMPARISON:  08/11/2016 MRI of the brain. FINDINGS: MRI HEAD FINDINGS Moderate motion artifact degrades several sequences. Brain: No acute infarction, hemorrhage, hydrocephalus, extra-axial collection or mass lesion. Stable mild chronic microvascular ischemic changes and mild parenchymal volume loss of the brain. Stable small chronic lacunar infarct within left hemi pons. Vascular: As below. Skull and upper cervical spine: Normal marrow signal. Sinuses/Orbits: Negative. Other: None. MRA HEAD FINDINGS Internal carotid arteries: Patent. Lumen irregularity of bilateral cavernous and paraclinoid segments without high-grade stenosis is likely related to atherosclerotic calcification.  Anterior cerebral arteries:  Patent. Middle cerebral arteries: Patent. Anterior communicating artery: Patent.  Posterior communicating arteries: Probable diminutive left. No right identified, likely hypoplastic or absent. Posterior cerebral arteries:  Patent. Basilar artery:  Patent. Vertebral arteries:  Patent. No high-grade stenosis or aneurysm identified. Mild motion artifact, suboptimal assessment for subtle stenosis or small 2-3 mm aneurysm. IMPRESSION: 1. No acute intracranial abnormality identified. 2. Stable mild chronic microvascular ischemic changes and mild parenchymal volume loss of the brain. Stable small chronic lacunar infarct in left hemi pons. 3. Patent circle of Willis. No high-grade stenosis, large vessel occlusion, or aneurysm identified. Electronically Signed   By: Kristine Garbe M.D.   On: 11/19/2016 00:33   Dg Chest Port 1 View  Result Date: 12/05/2016 CLINICAL DATA:  76 year old male with chest pain following aortic valve replacement EXAM: PORTABLE CHEST 1 VIEW COMPARISON:  Prior chest x-ray 12/04/2016 FINDINGS: Right IJ vascular sheath remains in unchanged position with the tip of the sheath overlying the upper SVC. A left subclavian approach cardiac rhythm maintenance device is in stable position with leads projecting over the right atrium and right ventricle. Stable cardiomegaly and surgical changes of median sternotomy with aortic valve replacement. Atherosclerotic calcifications again noted throughout the transverse aorta. Pulmonary vascular congestion with mild edema. Right basilar atelectasis and small effusion. No pneumothorax. The left chest tube has been removed. No acute osseous abnormality. IMPRESSION: 1. Interval removal of left-sided chest tube. No evidence of pneumothorax or other complicating feature. 2. Otherwise, stable appearance of the chest with persisting cardiomegaly, pulmonary edema and a small right pleural effusion with associated right basilar atelectasis. Electronically Signed   By: Jacqulynn Cadet M.D.   On: 12/05/2016 07:45   Dg Chest Port 1  View  Result Date: 12/04/2016 CLINICAL DATA:  Status post removal of patient's chest tubes. Initial encounter. EXAM: PORTABLE CHEST 1 VIEW COMPARISON:  Chest radiograph performed earlier today at 3:08 a.m. FINDINGS: The lungs are hypoexpanded. Vascular congestion is noted. Increased interstitial markings raise concern for mild interstitial edema. No definite pneumothorax is seen. The cardiomediastinal silhouette is mildly enlarged. The patient is status post median sternotomy. A valve replacement is noted. A left-sided pacemaker is noted, with leads ending overlying the right atrium and right ventricle. No acute osseous abnormalities are seen. IMPRESSION: Lungs hypoexpanded. Vascular congestion and mild cardiomegaly. Increased interstitial markings raise concern for mild interstitial edema. Electronically Signed   By: Garald Balding M.D.   On: 12/04/2016 05:34   Dg Chest Port 1 View  Result Date: 12/04/2016 CLINICAL DATA:  Status post AVR EXAM: PORTABLE CHEST 1 VIEW COMPARISON:  12/03/2016 FINDINGS: Post sternotomy changes. Left-sided duo lead pacing device. Mediastinal and chest drainage catheters remain in place. Swan-Ganz catheter has been removed, catheter sheath remains in place. There is stable cardiomegaly with central vascular congestion and mild edema, decreased compared to prior. Left basilar atelectasis. Probable tiny pleural effusions. IMPRESSION: 1. Removal of Swan-Ganz catheter 2. Stable enlarged cardiomediastinal silhouette with central vascular congestion but decreased pulmonary edema. 3. Tiny pleural effusions. Persistent atelectasis or consolidation in the medial left lung base. Electronically Signed   By: Donavan Foil M.D.   On: 12/04/2016 03:54   Dg Chest Port 1 View  Result Date: 12/03/2016 CLINICAL DATA:  Status post aortic valve replacement. EXAM: PORTABLE CHEST 1 VIEW COMPARISON:  12/02/2016 FINDINGS: Sequelae of aortic valve replacement are again identified. Endotracheal tube and  enteric tube have been removed. Right jugular Swan-Ganz catheter overlies the main pulmonary outflow tract. Mediastinal  drain and left chest tube remain in place. The cardiac silhouette remains enlarged. Pacemaker remains in place. Lung volumes remain diminished with increasing vascular congestion and interstitial densities bilaterally. Persistent retrocardiac opacity in the left lung base may reflect atelectasis. There are likely small bilateral pleural effusions. No pneumothorax is identified. IMPRESSION: Interval extubation. Cardiomegaly and low lung volumes with increasing vascular congestion and interstitial edema. Electronically Signed   By: Logan Bores M.D.   On: 12/03/2016 07:30   Dg Chest Port 1 View  Result Date: 12/02/2016 CLINICAL DATA:  Status post cardiac surgery. EXAM: PORTABLE CHEST 1 VIEW COMPARISON:  11/22/2016 FINDINGS: 1423 hours. Endotracheal tube tip 3.2 cm above the base of the carina. The NG tube passes into the stomach although the distal tip position is not included on the film. Right IJ pulmonary artery catheter tip is in the main pulmonary outflow tract. Midline fascia to midline mediastinal/pericardial drains are noted. Left chest tube identified without evidence for left-sided pneumothorax. Telemetry leads overlie the chest. Left delete permanent pacemaker remains in place. Cardiopericardial silhouette is enlarged. There is pulmonary vascular congestion without overt pulmonary edema. Retrocardiac opacity compatible with atelectasis. The visualized bony structures of the thorax are intact. IMPRESSION: 1. Cardiomegaly with low volumes and vascular congestion. 2. Support apparatus as described. 3. No evidence for pneumothorax. Electronically Signed   By: Misty Stanley M.D.   On: 12/02/2016 14:37   Ct Angio Chest/abd/pel For Dissection W And/or W/wo  Result Date: 11/28/2016 CLINICAL DATA:  76 year old male with history of severe aortic stenosis. Preprocedural study prior to  potential transcatheter aortic valve replacement (TAVR) procedure. EXAM: CT ANGIOGRAPHY CHEST, ABDOMEN AND PELVIS TECHNIQUE: Multidetector CT imaging through the chest, abdomen and pelvis was performed using the standard protocol during bolus administration of intravenous contrast. Multiplanar reconstructed images and MIPs were obtained and reviewed to evaluate the vascular anatomy. CONTRAST:  100 mL of Isovue 370. COMPARISON:  No priors. FINDINGS: CTA CHEST FINDINGS Cardiovascular: Heart size is mildly enlarged. There is no significant pericardial fluid, thickening or pericardial calcification. There is aortic atherosclerosis, as well as atherosclerosis of the great vessels of the mediastinum and the coronary arteries, including calcified atherosclerotic plaque in the left main, left anterior descending, left circumflex and right coronary arteries. Severe thickening calcifications of the aortic valve. Calcifications of the mitral annulus. Left-sided pacemaker device in position with lead tips terminating in the right atrial appendage and right ventricular apex. Mediastinum/Lymph Nodes: No pathologically enlarged mediastinal or hilar lymph nodes. Esophagus is unremarkable in appearance. No axillary lymphadenopathy. Lungs/Pleura: 14 x 8 mm (mean diameter of 11 mm) left lower lobe pulmonary nodule with some surrounding ground-glass attenuation (axial image 42 of series 6). 9 x 5 mm (mean diameter of 7 mm) subpleural nodule in the periphery of the right upper lobe (axial image 20 of series 6). A few patchy areas of peripheral predominant ground-glass attenuation and subpleural reticulation are noted, scattered randomly throughout the lungs bilaterally, without a definitive craniocaudal gradient. No acute consolidative airspace disease. No pleural effusions. Musculoskeletal/Soft Tissues: There are no aggressive appearing lytic or blastic lesions noted in the visualized portions of the skeleton. CTA ABDOMEN AND PELVIS  FINDINGS Hepatobiliary: No suspicious cystic or solid hepatic lesions. No intra or extrahepatic biliary ductal dilatation. Gallbladder is nearly completely decompressed, but otherwise unremarkable in appearance. Pancreas: No pancreatic mass. No pancreatic ductal dilatation. No pancreatic or peripancreatic fluid or inflammatory changes. Spleen: Unremarkable. Adrenals/Urinary Tract: Multiple renal lesions are noted in the kidneys bilaterally. Although several  of these lesions are low-attenuation, compatible with cysts, other lesions are intermediate to high attenuation, potentially enhancing (incompletely evaluated on today's contrast enhanced CT examination). Bilateral adrenal glands are unremarkable in appearance. No hydroureteronephrosis. Urinary bladder is normal in appearance. Stomach/Bowel: The appearance of the stomach is normal. There is no pathologic dilatation of small bowel or colon. Normal appendix. Vascular/Lymphatic: Aortic atherosclerosis, without evidence of aneurysm or dissection in the abdominal or pelvic vasculature. Vascular findings and measurements pertinent to potential TAVR procedure, as detailed below. Celiac axis, superior mesenteric artery and inferior mesenteric artery are all widely patent, without definite hemodynamically significant stenosis. Single renal arteries bilaterally are widely patent. No lymphadenopathy noted in the abdomen or pelvis. Reproductive: Prostate gland and seminal vesicles are unremarkable in appearance. Other: No significant volume of ascites.  No pneumoperitoneum. Musculoskeletal: There are no aggressive appearing lytic or blastic lesions noted in the visualized portions of the skeleton. VASCULAR MEASUREMENTS PERTINENT TO TAVR: AORTA: Minimal Aortic Diameter -  16 x 14 mm Severity of Aortic Calcification -  moderate RIGHT PELVIS: Right Common Iliac Artery - Minimal Diameter - 11.0 x 13.0 mm Tortuosity - mild Calcification - mild Right External Iliac Artery -  Minimal Diameter - 9.4 x 8.8 mm Tortuosity - severe Calcification - none Right Common Femoral Artery - Minimal Diameter - 8.9 x 10.0 mm Tortuosity - mild Calcification - mild LEFT PELVIS: Left Common Iliac Artery - Minimal Diameter - 13.3 x 12.3 mm Tortuosity - mild Calcification - mild Left External Iliac Artery - Minimal Diameter - 9.8 x 9.9 mm Tortuosity - severe Calcification - minimal Left Common Femoral Artery - Minimal Diameter - 9.8 x 10.7 mm Tortuosity - mild Calcification - none Review of the MIP images confirms the above findings. IMPRESSION: 1. Vascular findings and measurements pertinent to potential TAVR procedure, as detailed above. Patient does appear to have suitable pelvic arterial access bilaterally. 2. Severe thickening calcification of the aortic valve, compatible with the reported clinical history of severe aortic stenosis. 3. Pulmonary nodules in the lungs bilaterally, the largest of which has a mean diameter of 11 mm in the left lower lobe (axial image 42 of series 6). Non-contrast chest CT at 3-6 months is recommended. If the nodules are stable at time of repeat CT, then future CT at 18-24 months (from today's scan) is considered optional for low-risk patients, but is recommended for high-risk patients. This recommendation follows the consensus statement: Guidelines for Management of Incidental Pulmonary Nodules Detected on CT Images: From the Fleischner Society 2017; Radiology 2017; 284:228-243. 4. Aortic atherosclerosis, in addition to left main and 3 vessel coronary artery disease. Assessment for potential risk factor modification, dietary therapy or pharmacologic therapy may be warranted, if clinically indicated. 5. **An incidental finding of potential clinical significance has been found. Innumerable renal lesions bilaterally. Unfortunately, many of these are not characterized on today's examination. Any one or more of these could represent solid renal neoplasm(s). If the patient's  pacemaker is MRI compatible, further evaluation with nonemergent MRI of the abdomen with and without IV gadolinium would be recommended in the near future to exclude underlying neoplasm. Alternatively, CT of the abdomen with and without IV gadolinium could be performed if pacemaker is not MRI compatible. ** 6. Additional incidental findings, as above. Electronically Signed   By: Vinnie Langton M.D.   On: 11/28/2016 14:46    ASSESSMENT/PLAN:  Generalized weakness - for rehabilitation, PT and OT, for therapeutic stregthening exercises; fall precautions  Left atrial mass  -  S/P resection; continue Norvasc 10 mg 1 tab by mouth daily, metoprolol titrate 25 mg 1 tab by mouth twice a day; continue tramadol 50 mg 1 by mouth every 6 hours when necessary for pain; follow-up with cardiothoracic surgery and cardiology     Complete heart block - S/P aortic valve replacement with a 23 mm Edwards intuity rapid deployment pericardial tissue valve; continue Norvasc 10 mg 1 tab by mouth daily, metoprolol titrate 25 mg 1 tab by mouth twice a day; continue tramadol 50 mg 1 by mouth every 6 hours when necessary for pain; follow-up with cardiothoracic surgery and cardiology   Essential hypertension - continue Norvasc 10 mg 1 tab by mouth daily, metoprolol titrate 25 mg 1 tab by mouth twice a day; BP/HR BID X 1 week  Complication of foley catheter removal - no noted bleeding;  follow-up with [facility] in 1 week  Mild Dementia - continue supportive care, fall precautions  Chronic Kidney disease, stage III - check BMP Lab Results  Component Value Date   CREATININE 1.46 (H) 12/08/2016   Hypervolemia - continue Lasix 40 mg PO Q D X 7 days and KCL ER 20 meq 1 tab PO Q D X1 gtt to both eyes PRN 7 days  Hyperlipidemia - continue Simvastatin 20 mg 1 tab PO Q HS  Dry eyes - continue Artificial tear   Anemia, acute blood loss - will re-check CBC Lab Results  Component Value Date   HGB 7.8 (L) 12/08/2016    TIA - continue ASA EC 325 mg PO Q D and Simvastatin 20 mg 1 tab PO Q HS  Diabetes mellitus, type 2 - check CBG Q D Lab Results  Component Value Date   HGBA1C 6.2 (H) 11/19/2016       Goals of care:  Short-term rehabilitation    Monina C. Edon - NP    [employer] [phone]

## 2016-12-13 ENCOUNTER — Encounter: Payer: Self-pay | Admitting: Internal Medicine

## 2016-12-13 ENCOUNTER — Telehealth (HOSPITAL_COMMUNITY): Payer: Self-pay | Admitting: Internal Medicine

## 2016-12-13 ENCOUNTER — Non-Acute Institutional Stay (SKILLED_NURSING_FACILITY): Payer: Medicare Other | Admitting: Internal Medicine

## 2016-12-13 DIAGNOSIS — I10 Essential (primary) hypertension: Secondary | ICD-10-CM

## 2016-12-13 DIAGNOSIS — Z8673 Personal history of transient ischemic attack (TIA), and cerebral infarction without residual deficits: Secondary | ICD-10-CM | POA: Diagnosis not present

## 2016-12-13 DIAGNOSIS — I519 Heart disease, unspecified: Secondary | ICD-10-CM | POA: Diagnosis not present

## 2016-12-13 DIAGNOSIS — F0390 Unspecified dementia without behavioral disturbance: Secondary | ICD-10-CM | POA: Diagnosis not present

## 2016-12-13 DIAGNOSIS — N183 Chronic kidney disease, stage 3 unspecified: Secondary | ICD-10-CM

## 2016-12-13 DIAGNOSIS — I5189 Other ill-defined heart diseases: Secondary | ICD-10-CM

## 2016-12-13 DIAGNOSIS — R531 Weakness: Secondary | ICD-10-CM

## 2016-12-13 DIAGNOSIS — D500 Iron deficiency anemia secondary to blood loss (chronic): Secondary | ICD-10-CM

## 2016-12-13 DIAGNOSIS — Z952 Presence of prosthetic heart valve: Secondary | ICD-10-CM

## 2016-12-13 NOTE — Progress Notes (Signed)
LOCATION: [facility]   PCP: Kandice Hams, MD   Code Status: full code  Goals of care: Advanced Directive information Advanced Directives 12/13/2016  Does Patient Have a Medical Advance Directive? No  Type of Advance Directive -  Does patient want to make changes to medical advance directive? -  Would patient like information on creating a medical advance directive? -       Extended Emergency Contact Information Primary Emergency Contact: Aundria [place]ems [place] Mobile Phone: [phone] Relation: Son Secondary Emergency Contact: [place] Mobile Phone: [phone] Relation: Son   No Known Allergies  Chief Complaint  Patient presents with  . New Admit To SNF    Resident is being seen for admission to SNF.      HPI:  Patient is a 76 y.o. male seen today for short term rehabilitation post hospital admission from 12/02/16-12/09/16 wit left atrial mass and aortic insufficiency. He underwent resection and biopsy of the left atrial mass and aortic valve replacement on 12/02/16. Biopsy showed calcified fibrotic nodule and was negative for malignancy. He has medical history of HTN, HLD, dm type 2, dementia among others. He is seen in his room today.   Review of Systems:  Constitutional: Negative for fever, chills, diaphoresis. he feels weak and tired.  HENT: Negative for headache, congestion, nasal discharge, sore throat, difficulty swallowing.   Eyes: Negative for eye pain, blurred vision, double vision and discharge.  Respiratory: Negative for cough, shortness of breath and wheezing.   Cardiovascular: Negative for chest pain, palpitations, leg swelling.  Gastrointestinal: Negative for heartburn, nausea, vomiting, abdominal pain, loss of appetite. He had a bowel movement yesterday. Genitourinary: Negative for dysuria and flank pain.  Musculoskeletal: Negative for back pain, fall.    Skin: Negative for itching, rash.  Neurological: Negative for dizziness. Psychiatric/Behavioral: Negative for depression.    Past Medical History:  Diagnosis Date  . Aortic stenosis    a. 11/20/2016 Echo: mod AS;  b. 11/23/2016 TEE restricted mobility of noncoronary cusp;  c. 11/2016 Cath: mean grad 91mmHg w/ dual lumen catheter, AVA 2.0cm^2; mean gradient by pullback = 26mmHg, AVA 2.3cm^2.  . Carotid arterial disease ([place])    a. 11/2016 Carotid U/S: bilat 1-39% ICA stenosis.  . Chronic kidney disease   . Complete heart block ([place])    a. 11/21/2016 s/p MDT Santina Evans DR MRI SureScan (ser # [id number]).  . Dementia    mild  . Family history of adverse reaction to anesthesia    Son [name] had a difficult time waking up  . Heart murmur   . Hypercholesteremia   . Hypertension    benign  . Incidental pulmonary nodule, greater than or equal to 67mm - needs f/u scan 3 months 11/28/2016   14 x 8 mm (mean diameter of 11 mm) left lower lobe pulmonary nodule with some surrounding ground-glass attenuation (axial image 42 of series 6). 9 x 5 mm (mean diameter of 7 mm) subpleural nodule in the periphery of the right upper lobe (axial image 20 of series 6). A few patchy areas of peripheral predominant ground-glass attenuation and subpleural reticulation are noted, scattered randomly throu  . Left atrial mass   . 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 11/2009 TEE: EF 60-65%, restricted mobility of noncoronary AoV cusp, 16x4 mm mobile Ca2+ echodensity on the atrial basal surface of the posterior MV leaflet, mild MR, no LA/LAA thrombus.  Marland Kitchen  Non-obstructive CAD (coronary artery disease)    a. 11/2016 Cath: LM large, nl, LAD min irregs, LCX large, min irregs, OM3 40, RCA large, 31m,   . Pre-diabetes   . Prostate cancer [facility])    s/p radiation  . Renal mass of unknown nature - needs f/u MRI 11/28/2016   Multiple renal lesions are noted in the kidneys bilaterally. Although several of these lesions are  low-attenuation, compatible with cysts, other lesions are intermediate to high attenuation, potentially enhancing  . S/P aortic valve replacement with bioprosthetic valve 12/02/2016   23 mm Edwards Intuity rapid deployment bovine pericardial tissue valve  . S/P resection of left atrial mass 12/02/2016   Benign dystrophic fibrotic tissue with calcification attached to posterior mitral annulus by thin fibrotic stalk  . TIA (transient ischemic attack)    a. 11/2016 in setting of complete heart block.   Past Surgical History:  Procedure Laterality Date  . AORTIC VALVE REPLACEMENT N/A 12/02/2016   Procedure: AORTIC VALVE REPLACEMENT (AVR) using a 23 Edwards Intuity Elite Aortic Valve;  Surgeon: Rexene Alberts, MD;  Location: [place];  Service: Open Heart Surgery;  Laterality: N/A;  . EXCISION OF ATRIAL MYXOMA N/A 12/02/2016   Procedure: Resection of left atrial mass ;  Surgeon: Rexene Alberts, MD;  Location: [place];  Service: Open Heart Surgery;  Laterality: N/A;  . PACEMAKER IMPLANT N/A 11/21/2016   Procedure: Pacemaker Implant;  Surgeon: Will [name], MD;  Location: [place] [facility];  Service: Cardiovascular;  Laterality: N/A;  . RIGHT/LEFT HEART CATH AND CORONARY ANGIOGRAPHY N/A 11/25/2016   Procedure: Right/Left Heart Cath and Coronary Angiography;  Surgeon: Nelva Bush, MD;  Location: [place] [facility];  Service: Cardiovascular;  Laterality: N/A;  . TEE WITHOUT CARDIOVERSION N/A 11/23/2016   Procedure: TRANSESOPHAGEAL ECHOCARDIOGRAM (TEE);  Surgeon: Jerline Pain, MD;  Location: [place];  Service: Cardiovascular;  Laterality: N/A;  . TEE WITHOUT CARDIOVERSION N/A 12/02/2016   Procedure: TRANSESOPHAGEAL ECHOCARDIOGRAM (TEE);  Surgeon: Rexene Alberts, MD;  Location: [place];  Service: Open Heart Surgery;  Laterality: N/A;   Social History:   reports that he quit smoking about 3 weeks ago. His smoking use included Cigarettes. He smoked 0.25 packs per day. He has never used smokeless  tobacco. He reports that he does not drink alcohol or use drugs.  History reviewed. No pertinent family history.  Medications: Allergies as of 12/13/2016   No Known Allergies     Medication List       Accurate as of 12/13/16  5:20 PM. Always use your most recent med list.          amLODipine 10 MG tablet Commonly known as:  NORVASC Take 10 mg by mouth daily.   aspirin EC 325 MG tablet Take 1 tablet (325 mg total) by mouth daily.   cholecalciferol 1000 units tablet Commonly known as:  VITAMIN D Take 1,000 Units by mouth daily.   folic acid-pyridoxine-cyancobalamin 2.5-25-2 MG Tabs tablet Commonly known as:  FOLTX Take 1 tablet by mouth daily.   furosemide 40 MG tablet Commonly known as:  LASIX Take 1 tablet (40 mg total) by mouth daily. For 7 days   hydroxypropyl methylcellulose / hypromellose 2.5 % ophthalmic solution Commonly known as:  ISOPTO TEARS / GONIOVISC Place 1 drop into both eyes daily as needed for dry eyes.   iron polysaccharides 150 MG capsule Commonly known as:  NIFEREX Take 1 capsule (150 mg total) by mouth daily.   metoprolol  tartrate 25 MG tablet Commonly known as:  LOPRESSOR Take 1 tablet (25 mg total) by mouth 2 (two) times daily.   potassium chloride SA 20 MEQ tablet Commonly known as:  K-DUR,KLOR-CON Take 1 tablet (20 mEq total) by mouth daily. For 7 days   PREVAGEN PO Take 1 tablet by mouth daily. For Brain and Memory Health - OTC   simvastatin 20 MG tablet Commonly known as:  ZOCOR Take 20 mg by mouth at bedtime.   traMADol 50 MG tablet Commonly known as:  ULTRAM Take 1 tablet (50 mg total) by mouth every 6 (six) hours as needed for moderate pain.       Immunizations:  There is no immunization history on file for this patient.   Physical Exam: Vitals:   12/13/16 0826  BP: 140/74  Pulse: 72  Resp: 20  Temp: 98.1 F (36.7 C)  SpO2: 99%  Weight: 214 lb 12.8 oz (97.4 kg)  Height: 5\' 7"  (1.702 m)   Body mass index is  33.64 kg/m.  General- elderly male, obese, in no acute distress Head- normocephalic, atraumatic Nose- no nasal discharge Throat- moist mucus membrane Eyes- PERRLA, EOMI, no pallor, no icterus, no discharge, normal conjunctiva, normal sclera Neck- no cervical lymphadenopathy Cardiovascular- normal s1,s2 Respiratory- bilateral clear to auscultation, no wheeze, no rhonchi, no crackles, no use of accessory muscles Abdomen- bowel sounds present, soft, non tender, no guarding or rigidity Musculoskeletal- able to move all 4 extremities, generalized weakness Neurological- alert and oriented to person, place and month but not to year Skin- warm and dry, sternal surgical incision is healing well, 4 small surgical incision on abdominal wall healing well, slough to two incision sight Psychiatry- normal mood and affect    Labs reviewed: Basic Metabolic Panel:  Recent Labs  12/02/16 1800  12/03/16 0400  12/03/16 1651  12/05/16 0428 12/06/16 0208 12/08/16 0428  NA  --   < > 142  < >  --   < > 141 142 142  K  --   < > 3.8  < >  --   < > 3.6 4.3 3.8  CL  --   < > 110  < >  --   < > 110 111 111  CO2  --   --  23  --   --   < > 25 25 23   GLUCOSE  --   < > 126*  < >  --   < > 79 116* 109*  BUN  --   < > 14  < >  --   < > 24* 20 16  CREATININE 1.25*  < > 1.38*  < > 1.64*  < > 1.62* 1.61* 1.46*  CALCIUM  --   --  8.1*  --   --   < > 8.5* 8.6* 8.6*  MG 3.0*  --  2.9*  --  2.5*  --   --   --   --   < > = values in this interval not displayed. Liver Function Tests:  Recent Labs  11/18/16 1156 12/02/16 0617 12/05/16 0428  AST 24 27 27   ALT 15* 34 18  ALKPHOS 65 68 47  BILITOT 1.3* 0.6 0.6  PROT 6.9 6.8 5.0*  ALBUMIN 3.9 4.3 2.9*   No results for input(s): LIPASE, AMYLASE in the last 8760 hours. No results for input(s): AMMONIA in the last 8760 hours. CBC:  Recent Labs  11/18/16 1156  12/05/16 0428 12/06/16 6333 12/08/16 5456  WBC 7.5  < > 12.4* 9.7 7.2  NEUTROABS 5.7  --   --    --   --   HGB 11.8*  < > 7.9* 7.7* 7.8*  HCT 35.1*  < > 23.1* 22.6* 23.4*  MCV 87.5  < > 86.2 87.3 88.6  PLT 176  < > 125* 144* 165  < > = values in this interval not displayed. Cardiac Enzymes: No results for input(s): CKTOTAL, CKMB, CKMBINDEX, TROPONINI in the last 8760 hours. BNP: Invalid input(s): POCBNP CBG:  Recent Labs  12/05/16 0407 12/05/16 0748 12/05/16 0806  GLUCAP 74 66 75    Radiological Exams: Dg Chest 2 View  Result Date: 12/06/2016 CLINICAL DATA:  76 year old male with a history of shortness of breath. Recent aortic valve replacement EXAM: CHEST  2 VIEW COMPARISON:  Multiple prior chest x-ray, most recent 12/05/2016. Prior CT 11/26/2016 FINDINGS: Cardiomediastinal silhouette relatively unchanged in size and contour. Calcifications of the aortic arch. Surgical changes of median sternotomy and aortic valve replacement. Unchanged cardiac pacing device on left chest wall. Interval removal of right IJ sheath. Compared to the preoperative x-ray of 11/22/2016, there is relatively similar reticulonodular opacity at the bilateral lung bases, with low lung volumes. No large pleural effusion or pneumothorax. No interlobular septal thickening. IMPRESSION: Compared to the preoperative chest x-ray, there is relatively similar appearance of reticulonodular opacity, most pronounced at the lung bases with low lung volumes. Findings favored to represent atelectasis and chronic lung fibrosis/scarring, however, mild edema and/or developing infection cannot be excluded. Surgical changes of median sternotomy and aortic valve replacement. Aortic atherosclerosis. Interval removal of right IJ sheath. Electronically Signed   By: Corrie Mckusick D.O.   On: 12/06/2016 08:11   Dg Chest 2 View  Result Date: 11/22/2016 CLINICAL DATA:  Pacer placement EXAM: CHEST  2 VIEW COMPARISON:  None. FINDINGS: Placement of left pacer with leads in the right atrium and right ventricle. No pneumothorax. Heart is upper  limits normal in size. Low lung volumes with bibasilar atelectasis. No effusions. IMPRESSION: Left pacer placement without pneumothorax. Bibasilar atelectasis. Electronically Signed   By: Rolm Baptise M.D.   On: 11/22/2016 07:19   Mr Virgel Paling IF Contrast  Result Date: 11/19/2016 CLINICAL DATA:  76 y/o M; episode of slurred speech and right facial droop with onset today. EXAM: MRI HEAD WITHOUT CONTRAST MRA HEAD WITHOUT CONTRAST TECHNIQUE: Multiplanar, multiecho pulse sequences of the brain and surrounding structures were obtained without intravenous contrast. Angiographic images of the head were obtained using MRA technique without contrast. COMPARISON:  08/11/2016 MRI of the brain. FINDINGS: MRI HEAD FINDINGS Moderate motion artifact degrades several sequences. Brain: No acute infarction, hemorrhage, hydrocephalus, extra-axial collection or mass lesion. Stable mild chronic microvascular ischemic changes and mild parenchymal volume loss of the brain. Stable small chronic lacunar infarct within left hemi pons. Vascular: As below. Skull and upper cervical spine: Normal marrow signal. Sinuses/Orbits: Negative. Other: None. MRA HEAD FINDINGS Internal carotid arteries: Patent. Lumen irregularity of bilateral cavernous and paraclinoid segments without high-grade stenosis is likely related to atherosclerotic calcification. Anterior cerebral arteries:  Patent. Middle cerebral arteries: Patent. Anterior communicating artery: Patent. Posterior communicating arteries: Probable diminutive left. No right identified, likely hypoplastic or absent. Posterior cerebral arteries:  Patent. Basilar artery:  Patent. Vertebral arteries:  Patent. No high-grade stenosis or aneurysm identified. Mild motion artifact, suboptimal assessment for subtle stenosis or small 2-3 mm aneurysm. IMPRESSION: 1. No acute intracranial abnormality identified. 2. Stable mild chronic microvascular ischemic changes and mild  parenchymal volume loss of the  brain. Stable small chronic lacunar infarct in left hemi pons. 3. Patent circle of Willis. No high-grade stenosis, large vessel occlusion, or aneurysm identified. Electronically Signed   By: Kristine Garbe M.D.   On: 11/19/2016 00:33   Mr Brain Wo Contrast  Result Date: 11/19/2016 CLINICAL DATA:  76 y/o M; episode of slurred speech and right facial droop with onset today. EXAM: MRI HEAD WITHOUT CONTRAST MRA HEAD WITHOUT CONTRAST TECHNIQUE: Multiplanar, multiecho pulse sequences of the brain and surrounding structures were obtained without intravenous contrast. Angiographic images of the head were obtained using MRA technique without contrast. COMPARISON:  08/11/2016 MRI of the brain. FINDINGS: MRI HEAD FINDINGS Moderate motion artifact degrades several sequences. Brain: No acute infarction, hemorrhage, hydrocephalus, extra-axial collection or mass lesion. Stable mild chronic microvascular ischemic changes and mild parenchymal volume loss of the brain. Stable small chronic lacunar infarct within left hemi pons. Vascular: As below. Skull and upper cervical spine: Normal marrow signal. Sinuses/Orbits: Negative. Other: None. MRA HEAD FINDINGS Internal carotid arteries: Patent. Lumen irregularity of bilateral cavernous and paraclinoid segments without high-grade stenosis is likely related to atherosclerotic calcification. Anterior cerebral arteries:  Patent. Middle cerebral arteries: Patent. Anterior communicating artery: Patent. Posterior communicating arteries: Probable diminutive left. No right identified, likely hypoplastic or absent. Posterior cerebral arteries:  Patent. Basilar artery:  Patent. Vertebral arteries:  Patent. No high-grade stenosis or aneurysm identified. Mild motion artifact, suboptimal assessment for subtle stenosis or small 2-3 mm aneurysm. IMPRESSION: 1. No acute intracranial abnormality identified. 2. Stable mild chronic microvascular ischemic changes and mild parenchymal volume loss  of the brain. Stable small chronic lacunar infarct in left hemi pons. 3. Patent circle of Willis. No high-grade stenosis, large vessel occlusion, or aneurysm identified. Electronically Signed   By: Kristine Garbe M.D.   On: 11/19/2016 00:33   Dg Chest Port 1 View  Result Date: 12/05/2016 CLINICAL DATA:  76 year old male with chest pain following aortic valve replacement EXAM: PORTABLE CHEST 1 VIEW COMPARISON:  Prior chest x-ray 12/04/2016 FINDINGS: Right IJ vascular sheath remains in unchanged position with the tip of the sheath overlying the upper SVC. A left subclavian approach cardiac rhythm maintenance device is in stable position with leads projecting over the right atrium and right ventricle. Stable cardiomegaly and surgical changes of median sternotomy with aortic valve replacement. Atherosclerotic calcifications again noted throughout the transverse aorta. Pulmonary vascular congestion with mild edema. Right basilar atelectasis and small effusion. No pneumothorax. The left chest tube has been removed. No acute osseous abnormality. IMPRESSION: 1. Interval removal of left-sided chest tube. No evidence of pneumothorax or other complicating feature. 2. Otherwise, stable appearance of the chest with persisting cardiomegaly, pulmonary edema and a small right pleural effusion with associated right basilar atelectasis. Electronically Signed   By: Jacqulynn Cadet M.D.   On: 12/05/2016 07:45   Dg Chest Port 1 View  Result Date: 12/04/2016 CLINICAL DATA:  Status post removal of patient's chest tubes. Initial encounter. EXAM: PORTABLE CHEST 1 VIEW COMPARISON:  Chest radiograph performed earlier today at 3:08 a.m. FINDINGS: The lungs are hypoexpanded. Vascular congestion is noted. Increased interstitial markings raise concern for mild interstitial edema. No definite pneumothorax is seen. The cardiomediastinal silhouette is mildly enlarged. The patient is status post median sternotomy. A valve  replacement is noted. A left-sided pacemaker is noted, with leads ending overlying the right atrium and right ventricle. No acute osseous abnormalities are seen. IMPRESSION: Lungs hypoexpanded. Vascular congestion and mild cardiomegaly. Increased interstitial  markings raise concern for mild interstitial edema. Electronically Signed   By: Garald Balding M.D.   On: 12/04/2016 05:34   Dg Chest Port 1 View  Result Date: 12/04/2016 CLINICAL DATA:  Status post AVR EXAM: PORTABLE CHEST 1 VIEW COMPARISON:  12/03/2016 FINDINGS: Post sternotomy changes. Left-sided duo lead pacing device. Mediastinal and chest drainage catheters remain in place. Swan-Ganz catheter has been removed, catheter sheath remains in place. There is stable cardiomegaly with central vascular congestion and mild edema, decreased compared to prior. Left basilar atelectasis. Probable tiny pleural effusions. IMPRESSION: 1. Removal of Swan-Ganz catheter 2. Stable enlarged cardiomediastinal silhouette with central vascular congestion but decreased pulmonary edema. 3. Tiny pleural effusions. Persistent atelectasis or consolidation in the medial left lung base. Electronically Signed   By: Donavan Foil M.D.   On: 12/04/2016 03:54   Dg Chest Port 1 View  Result Date: 12/03/2016 CLINICAL DATA:  Status post aortic valve replacement. EXAM: PORTABLE CHEST 1 VIEW COMPARISON:  12/02/2016 FINDINGS: Sequelae of aortic valve replacement are again identified. Endotracheal tube and enteric tube have been removed. Right jugular Swan-Ganz catheter overlies the main pulmonary outflow tract. Mediastinal drain and left chest tube remain in place. The cardiac silhouette remains enlarged. Pacemaker remains in place. Lung volumes remain diminished with increasing vascular congestion and interstitial densities bilaterally. Persistent retrocardiac opacity in the left lung base may reflect atelectasis. There are likely small bilateral pleural effusions. No pneumothorax is  identified. IMPRESSION: Interval extubation. Cardiomegaly and low lung volumes with increasing vascular congestion and interstitial edema. Electronically Signed   By: Logan Bores M.D.   On: 12/03/2016 07:30   Dg Chest Port 1 View  Result Date: 12/02/2016 CLINICAL DATA:  Status post cardiac surgery. EXAM: PORTABLE CHEST 1 VIEW COMPARISON:  11/22/2016 FINDINGS: 1423 hours. Endotracheal tube tip 3.2 cm above the base of the carina. The NG tube passes into the stomach although the distal tip position is not included on the film. Right IJ pulmonary artery catheter tip is in the main pulmonary outflow tract. Midline fascia to midline mediastinal/pericardial drains are noted. Left chest tube identified without evidence for left-sided pneumothorax. Telemetry leads overlie the chest. Left delete permanent pacemaker remains in place. Cardiopericardial silhouette is enlarged. There is pulmonary vascular congestion without overt pulmonary edema. Retrocardiac opacity compatible with atelectasis. The visualized bony structures of the thorax are intact. IMPRESSION: 1. Cardiomegaly with low volumes and vascular congestion. 2. Support apparatus as described. 3. No evidence for pneumothorax. Electronically Signed   By: Misty Stanley M.D.   On: 12/02/2016 14:37   Ct Angio Chest/abd/pel For Dissection W And/or W/wo  Result Date: 11/28/2016 CLINICAL DATA:  76 year old male with history of severe aortic stenosis. Preprocedural study prior to potential transcatheter aortic valve replacement (TAVR) procedure. EXAM: CT ANGIOGRAPHY CHEST, ABDOMEN AND PELVIS TECHNIQUE: Multidetector CT imaging through the chest, abdomen and pelvis was performed using the standard protocol during bolus administration of intravenous contrast. Multiplanar reconstructed images and MIPs were obtained and reviewed to evaluate the vascular anatomy. CONTRAST:  100 mL of Isovue 370. COMPARISON:  No priors. FINDINGS: CTA CHEST FINDINGS Cardiovascular: Heart  size is mildly enlarged. There is no significant pericardial fluid, thickening or pericardial calcification. There is aortic atherosclerosis, as well as atherosclerosis of the great vessels of the mediastinum and the coronary arteries, including calcified atherosclerotic plaque in the left main, left anterior descending, left circumflex and right coronary arteries. Severe thickening calcifications of the aortic valve. Calcifications of the mitral annulus. Left-sided pacemaker device  in position with lead tips terminating in the right atrial appendage and right ventricular apex. Mediastinum/Lymph Nodes: No pathologically enlarged mediastinal or hilar lymph nodes. Esophagus is unremarkable in appearance. No axillary lymphadenopathy. Lungs/Pleura: 14 x 8 mm (mean diameter of 11 mm) left lower lobe pulmonary nodule with some surrounding ground-glass attenuation (axial image 42 of series 6). 9 x 5 mm (mean diameter of 7 mm) subpleural nodule in the periphery of the right upper lobe (axial image 20 of series 6). A few patchy areas of peripheral predominant ground-glass attenuation and subpleural reticulation are noted, scattered randomly throughout the lungs bilaterally, without a definitive craniocaudal gradient. No acute consolidative airspace disease. No pleural effusions. Musculoskeletal/Soft Tissues: There are no aggressive appearing lytic or blastic lesions noted in the visualized portions of the skeleton. CTA ABDOMEN AND PELVIS FINDINGS Hepatobiliary: No suspicious cystic or solid hepatic lesions. No intra or extrahepatic biliary ductal dilatation. Gallbladder is nearly completely decompressed, but otherwise unremarkable in appearance. Pancreas: No pancreatic mass. No pancreatic ductal dilatation. No pancreatic or peripancreatic fluid or inflammatory changes. Spleen: Unremarkable. Adrenals/Urinary Tract: Multiple renal lesions are noted in the kidneys bilaterally. Although several of these lesions are  low-attenuation, compatible with cysts, other lesions are intermediate to high attenuation, potentially enhancing (incompletely evaluated on today's contrast enhanced CT examination). Bilateral adrenal glands are unremarkable in appearance. No hydroureteronephrosis. Urinary bladder is normal in appearance. Stomach/Bowel: The appearance of the stomach is normal. There is no pathologic dilatation of small bowel or colon. Normal appendix. Vascular/Lymphatic: Aortic atherosclerosis, without evidence of aneurysm or dissection in the abdominal or pelvic vasculature. Vascular findings and measurements pertinent to potential TAVR procedure, as detailed below. Celiac axis, superior mesenteric artery and inferior mesenteric artery are all widely patent, without definite hemodynamically significant stenosis. Single renal arteries bilaterally are widely patent. No lymphadenopathy noted in the abdomen or pelvis. Reproductive: Prostate gland and seminal vesicles are unremarkable in appearance. Other: No significant volume of ascites.  No pneumoperitoneum. Musculoskeletal: There are no aggressive appearing lytic or blastic lesions noted in the visualized portions of the skeleton. VASCULAR MEASUREMENTS PERTINENT TO TAVR: AORTA: Minimal Aortic Diameter -  16 x 14 mm Severity of Aortic Calcification -  moderate RIGHT PELVIS: Right Common Iliac Artery - Minimal Diameter - 11.0 x 13.0 mm Tortuosity - mild Calcification - mild Right External Iliac Artery - Minimal Diameter - 9.4 x 8.8 mm Tortuosity - severe Calcification - none Right Common Femoral Artery - Minimal Diameter - 8.9 x 10.0 mm Tortuosity - mild Calcification - mild LEFT PELVIS: Left Common Iliac Artery - Minimal Diameter - 13.3 x 12.3 mm Tortuosity - mild Calcification - mild Left External Iliac Artery - Minimal Diameter - 9.8 x 9.9 mm Tortuosity - severe Calcification - minimal Left Common Femoral Artery - Minimal Diameter - 9.8 x 10.7 mm Tortuosity - mild Calcification -  none Review of the MIP images confirms the above findings. IMPRESSION: 1. Vascular findings and measurements pertinent to potential TAVR procedure, as detailed above. Patient does appear to have suitable pelvic arterial access bilaterally. 2. Severe thickening calcification of the aortic valve, compatible with the reported clinical history of severe aortic stenosis. 3. Pulmonary nodules in the lungs bilaterally, the largest of which has a mean diameter of 11 mm in the left lower lobe (axial image 42 of series 6). Non-contrast chest CT at 3-6 months is recommended. If the nodules are stable at time of repeat CT, then future CT at 18-24 months (from today's scan) is considered optional  for low-risk patients, but is recommended for high-risk patients. This recommendation follows the consensus statement: Guidelines for Management of Incidental Pulmonary Nodules Detected on CT Images: From the Fleischner Society 2017; Radiology 2017; 284:228-243. 4. Aortic atherosclerosis, in addition to left main and 3 vessel coronary artery disease. Assessment for potential risk factor modification, dietary therapy or pharmacologic therapy may be warranted, if clinically indicated. 5. **An incidental finding of potential clinical significance has been found. Innumerable renal lesions bilaterally. Unfortunately, many of these are not characterized on today's examination. Any one or more of these could represent solid renal neoplasm(s). If the patient's pacemaker is MRI compatible, further evaluation with nonemergent MRI of the abdomen with and without IV gadolinium would be recommended in the near future to exclude underlying neoplasm. Alternatively, CT of the abdomen with and without IV gadolinium could be performed if pacemaker is not MRI compatible. ** 6. Additional incidental findings, as above. Electronically Signed   By: Vinnie Langton M.D.   On: 11/28/2016 14:46    Assessment/Plan  Generalized weakness Will have him work  with physical therapy and occupational therapy team to help with gait training and muscle strengthening exercises.fall precautions. Skin care. Encourage to be out of bed.   Aortic insufficiency s/p aortic valve replacement. Will need cardiothoracic surgery follow up. Wound care to be provided. Continue tramadol 50 mg q6h prn for chest wall soreness. Continue lasix and kcl for now. Check bmp  Blood loss anemia Post op, monitor cbc. 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 Has been urinating well for now. Monitor clinically  History of TIA Continue aspirin ec 325 mg daily and simvastatin 20 mg daily.    Goals of care: short term rehabilitation   Labs/tests ordered: CBC, BMP  Family/ staff Communication: reviewed care plan with patient and nursing supervisor    Blanchie Serve, MD Internal Medicine [place], [place] [address] Cell Phone (Monday-Friday 8 am - 5 pm): [phone] On Call: [phone] and follow prompts after 5 pm and on weekends Office Phone: [phone] Office Fax: [phone]

## 2016-12-13 NOTE — Telephone Encounter (Signed)
Patient insurances active and verified. Medicare A/B - primary - no co-payment, deductible $183.00/$183.00 has been met, no out of pocket, co-insurance 20%, no pre-authorization and no limit on visit. Passport/reference [phone]. GHI - secondary - no co-payment, no deductible, no out of pocket, no co-insurance, no pre-authorization and no limit on visit. Passport/reference [phone].  Referral on hold until further notice, patient discharged to SNF.

## 2016-12-14 ENCOUNTER — Telehealth: Payer: Self-pay | Admitting: Surgical

## 2016-12-14 LAB — BASIC METABOLIC PANEL
BUN: 16 mg/dL (ref 4–21)
Creatinine: 1.3 mg/dL (ref 0.6–1.3)
GLUCOSE: 119 mg/dL
Potassium: 4.4 mmol/L (ref 3.4–5.3)
SODIUM: 146 mmol/L (ref 137–147)

## 2016-12-14 LAB — CBC AND DIFFERENTIAL
HEMATOCRIT: 27 % — AB (ref 41–53)
Hemoglobin: 8.8 g/dL — AB (ref 13.5–17.5)
Platelets: 244 10*3/uL (ref 150–399)
WBC: 9.8 10*3/mL

## 2016-12-14 NOTE — Telephone Encounter (Signed)
[address]       [place] [address]             [phone]    Irie Goldring [id number]   S/P : Date of Procedure:                12/02/2016  Preoperative Diagnosis:        Left Atrial Mass  Moderate Aortic Stenosis   Postoperative Diagnosis:    Same   Procedure:        Resection of Left Atrial Mass             Benign dystrophic fibrotic tissue with calcification, attached to posterior mitral annulus by thin fibrotic stalk   Aortic Valve Replacement             Edwards Intuity Elite rapid deployment bovine pericardial tissue valve (size 23 mm, model # 8300AB, serial # [id number])              Surgeon:        Valentina Gu. Roxy Manns, MD  Assistant:       Ellwood Handler, PA-C  Anesthesia:    Roderic Palau, MD  Operative Findings:  Mobile mass in left atrium arising from thin fibrotic stalk off of posterior mitral annulus c/w benign dystrophic fibrotic tissue with calcification  Moderate aortic stenosis  Mild mitral regurgitation  Normal LV systolic function  Moderate coagulopathy   Discharged  on 4/27 toSNF  Medications:. Allergies as of 12/14/2016   No Known Allergies     Medication List       Accurate as of 12/14/16  2:26 PM. Always use your most recent med list.          amLODipine 10 MG tablet Commonly known as:  NORVASC Take 10 mg by mouth daily.   aspirin EC 325 MG tablet Take 1 tablet (325 mg total) by mouth daily.   cholecalciferol 1000 units tablet Commonly known as:  VITAMIN D Take 1,000 Units by mouth daily.   folic acid-pyridoxine-cyancobalamin 2.5-25-2 MG Tabs tablet Commonly known as:  FOLTX Take 1 tablet by mouth daily.   furosemide 40 MG tablet Commonly known as:  LASIX Take 1 tablet (40 mg total) by mouth daily. For 7 days   hydroxypropyl methylcellulose / hypromellose 2.5 % ophthalmic solution Commonly known as:  ISOPTO TEARS / GONIOVISC Place 1 drop into both eyes daily as needed for dry  eyes.   iron polysaccharides 150 MG capsule Commonly known as:  NIFEREX Take 1 capsule (150 mg total) by mouth daily.   metoprolol tartrate 25 MG tablet Commonly known as:  LOPRESSOR Take 1 tablet (25 mg total) by mouth 2 (two) times daily.   potassium chloride SA 20 MEQ tablet Commonly known as:  K-DUR,KLOR-CON Take 1 tablet (20 mEq total) by mouth daily. For 7 days   PREVAGEN PO Take 1 tablet by mouth daily. For Brain and Memory Health - OTC   simvastatin 20 MG tablet Commonly known as:  ZOCOR Take 20 mg by mouth at bedtime.   traMADol 50 MG tablet Commonly known as:  ULTRAM Take 1 tablet (50 mg total) by mouth every 6 (six) hours as needed for moderate pain.       Coumadin:  INR check N/A  Problems/Concerns:Patients son had some questions about wound care that I answered. He says the patient is making significant improvement with his cognitive abilities and also doing well with PT. He is anxious to  get him out within 14 days.   Assessment:  Patient is doing well.   Further instructions provided to son .  Contact office if concerns or problems develop  Follow up Appointment: No Follow-up on file.

## 2016-12-15 ENCOUNTER — Telehealth: Payer: Self-pay | Admitting: Internal Medicine

## 2016-12-15 NOTE — Telephone Encounter (Signed)
(  late entry) I spoke with Lavella Lemons, RN about an hour ago- she had questions about the patient's wound care s/p cardiac surgery. I advised her she would need to call Dr. Ricard Dillon office to follow up on wound care.

## 2016-12-15 NOTE — Telephone Encounter (Signed)
[name] is s/p AVReplacement on 12/02/16 and was discharged to [facility]. His son has called the office very irate because of the wound care he is rece iving. Since his arrival the sternal incision were covered and the chest tube sites that were steri-stripped had been tapped over with "more tape". Now the sternal incision dressing has been removed and the strips/tape have been removed from the chest tube sites. One of them has drainage. I told him I would call and talk with a nurse who was responsible for his care. He was very [employer] attorney. I did call, spoke with his day nurse. I reinforced that no dressing was needed for the sternal incision. The one chest tube site that was draining should be irrigated with peroxide and then soap and water cleansing twice daily, followed by a light dressing or band aid. She said they had a wound care nurse. I said to give these orders to her and she could call me with any questions.

## 2016-12-15 NOTE — Telephone Encounter (Signed)
Tanya a nurse from [facility] calling, she has questions about some orders. Please call at [phone]. Thanks.

## 2016-12-20 ENCOUNTER — Encounter: Payer: Self-pay | Admitting: Adult Health

## 2016-12-20 ENCOUNTER — Non-Acute Institutional Stay (SKILLED_NURSING_FACILITY): Payer: Medicare Other | Admitting: Adult Health

## 2016-12-20 ENCOUNTER — Telehealth: Payer: Self-pay | Admitting: Cardiology

## 2016-12-20 DIAGNOSIS — D62 Acute posthemorrhagic anemia: Secondary | ICD-10-CM | POA: Diagnosis not present

## 2016-12-20 DIAGNOSIS — N183 Chronic kidney disease, stage 3 unspecified: Secondary | ICD-10-CM

## 2016-12-20 DIAGNOSIS — I10 Essential (primary) hypertension: Secondary | ICD-10-CM | POA: Diagnosis not present

## 2016-12-20 DIAGNOSIS — E118 Type 2 diabetes mellitus with unspecified complications: Secondary | ICD-10-CM

## 2016-12-20 DIAGNOSIS — I442 Atrioventricular block, complete: Secondary | ICD-10-CM

## 2016-12-20 DIAGNOSIS — R531 Weakness: Secondary | ICD-10-CM | POA: Diagnosis not present

## 2016-12-20 DIAGNOSIS — F0390 Unspecified dementia without behavioral disturbance: Secondary | ICD-10-CM

## 2016-12-20 DIAGNOSIS — E785 Hyperlipidemia, unspecified: Secondary | ICD-10-CM

## 2016-12-20 DIAGNOSIS — Z8673 Personal history of transient ischemic attack (TIA), and cerebral infarction without residual deficits: Secondary | ICD-10-CM

## 2016-12-20 DIAGNOSIS — I519 Heart disease, unspecified: Secondary | ICD-10-CM

## 2016-12-20 DIAGNOSIS — H04123 Dry eye syndrome of bilateral lacrimal glands: Secondary | ICD-10-CM

## 2016-12-20 DIAGNOSIS — I5189 Other ill-defined heart diseases: Secondary | ICD-10-CM

## 2016-12-20 NOTE — Progress Notes (Signed)
DATE:  12/20/2016   MRN:  [id number]  BIRTHDAY: 30-May-1941  Facility:  [facility] Home Location:  [place] Room Number: 407-P  LEVEL OF CARE:  SNF (31)  Contact Information    Name Relation Home Work Mobile   [place] Son   [phone]   [place], Born   [phone]       Code Status History    Date Active Date Inactive Code Status Order ID Comments User Context   12/02/2016  2:05 PM 12/09/2016  5:50 PM Full Code [id number]  Rexene Alberts, MD Inpatient   11/18/2016  8:46 PM 11/26/2016  6:08 PM Full Code [id number]  Leanor Kail, PA Inpatient       Chief Complaint  Patient presents with  . Discharge Note     HISTORY OF PRESENT ILLNESS:  This is a 76-YO male who is for discharge home with Home health PT and OT .  He has been admitted to [facility] for short-term rehabilitation on 12/09/2016 following an admission at [facility] 12/02/2016-12/09/2016 for resection of left atrial mass and aortic valve replacement with a 23 mm Percell Miller Intuity rapid deployment pericardial tissue valve on 12/02/16. He developed confusion, agitation and delirium post-op. He pulled his foley catheter and 3 of 4 chest tubes out. Minor bleeding from foley removal. Follow-up CXR showed no evidence of pneumothorax.  On post-op day 2, he has his PPM placed.  He developed mild elevation in his creatinine. He had mild hypervolemia and was diuresed with Lasix.  He has a PMH of type II DM, stage III CKD, non-obstructive CAD, renal mass of unknown nature, complete heart block, essential hypertension, and cardiac pacemaker in situ.    Patient was admitted to this facility for short-term rehabilitation after the patient's recent hospitalization.  Patient has completed SNF rehabilitation and therapy has cleared the patient for discharge.    PAST MEDICAL HISTORY:  Past Medical History:  Diagnosis Date  . Aortic stenosis    a. 11/20/2016 Echo: mod AS;  b.  11/23/2016 TEE restricted mobility of noncoronary cusp;  c. 11/2016 Cath: mean grad 28mmHg w/ dual lumen catheter, AVA 2.0cm^2; mean gradient by pullback = 30mmHg, AVA 2.3cm^2.  . Carotid arterial disease ([place])    a. 11/2016 Carotid U/S: bilat 1-39% ICA stenosis.  . Chronic kidney disease   . Complete heart block ([place])    a. 11/21/2016 s/p MDT Santina Evans DR MRI SureScan (ser # [id number]).  . Dementia    mild  . Family history of adverse reaction to anesthesia    Son [name] had a difficult time waking up  . Heart murmur   . Hypercholesteremia   . Hypertension    benign  . Incidental pulmonary nodule, greater than or equal to 42mm - needs f/u scan 3 months 11/28/2016   14 x 8 mm (mean diameter of 11 mm) left lower lobe pulmonary nodule with some surrounding ground-glass attenuation (axial image 42 of series 6). 9 x 5 mm (mean diameter of 7 mm) subpleural nodule in the periphery of the right upper lobe (axial image 20 of series 6). A few patchy areas of peripheral predominant ground-glass attenuation and subpleural reticulation are noted, scattered randomly throu  . Left atrial mass   . 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 11/2009 TEE: EF 60-65%, restricted mobility of noncoronary AoV cusp, 16x4 mm mobile Ca2+ echodensity on the atrial basal surface of the posterior MV leaflet,  mild MR, no LA/LAA thrombus.  . Non-obstructive CAD (coronary artery disease)    a. 11/2016 Cath: LM large, nl, LAD min irregs, LCX large, min irregs, OM3 40, RCA large, 19m,   . Pre-diabetes   . Prostate cancer [facility])    s/p radiation  . Renal mass of unknown nature - needs f/u MRI 11/28/2016   Multiple renal lesions are noted in the kidneys bilaterally. Although several of these lesions are low-attenuation, compatible with cysts, other lesions are intermediate to high attenuation, potentially enhancing  . S/P aortic valve replacement with bioprosthetic valve 12/02/2016   23 mm Edwards Intuity rapid deployment bovine  pericardial tissue valve  . S/P resection of left atrial mass 12/02/2016   Benign dystrophic fibrotic tissue with calcification attached to posterior mitral annulus by thin fibrotic stalk  . TIA (transient ischemic attack)    a. 11/2016 in setting of complete heart block.     CURRENT MEDICATIONS: Reviewed  Patient's Medications  New Prescriptions   No medications on file  Previous Medications   AMLODIPINE (NORVASC) 10 MG TABLET    Take 10 mg by mouth daily.   APOAEQUORIN (PREVAGEN PO)    Take 1 tablet by mouth daily. For Brain and Memory Health - OTC   ASPIRIN EC 325 MG TABLET    Take 1 tablet (325 mg total) by mouth daily.   CHOLECALCIFEROL (VITAMIN D) 1000 UNITS TABLET    Take 1,000 Units by mouth daily.   FOLIC ACID-PYRIDOXINE-CYANCOBALAMIN (FOLTX) 2.5-25-2 MG TABS TABLET    Take 1 tablet by mouth daily.   HYDROXYPROPYL METHYLCELLULOSE / HYPROMELLOSE (ISOPTO TEARS / GONIOVISC) 2.5 % OPHTHALMIC SOLUTION    Place 1 drop into both eyes daily as needed for dry eyes.   IRON POLYSACCHARIDES (NIFEREX) 150 MG CAPSULE    Take 1 capsule (150 mg total) by mouth daily.   METOPROLOL TARTRATE (LOPRESSOR) 25 MG TABLET    Take 1 tablet (25 mg total) by mouth 2 (two) times daily.   SIMVASTATIN (ZOCOR) 20 MG TABLET    Take 20 mg by mouth at bedtime.    TRAMADOL (ULTRAM) 50 MG TABLET    Take 1 tablet (50 mg total) by mouth every 6 (six) hours as needed for moderate pain.  Modified Medications   No medications on file  Discontinued Medications   FUROSEMIDE (LASIX) 40 MG TABLET    Take 1 tablet (40 mg total) by mouth daily. For 7 days   POTASSIUM CHLORIDE SA (K-DUR,KLOR-CON) 20 MEQ TABLET    Take 1 tablet (20 mEq total) by mouth daily. For 7 days     No Known Allergies   REVIEW OF SYSTEMS:  GENERAL: no change in appetite, no fatigue, no weight changes, no fever, chills or weakness EYES: Denies change in vision, dry eyes, eye pain, itching or discharge EARS: Denies change in hearing, ringing in  ears, or earache NOSE: Denies nasal congestion or epistaxis MOUTH and THROAT: Denies oral discomfort, gingival pain or bleeding, pain from teeth or hoarseness   RESPIRATORY: no cough, SOB, DOE, wheezing, hemoptysis CARDIAC: no chest pain, edema or palpitations GI: no abdominal pain, diarrhea, constipation, heart burn, nausea or vomiting GU: Denies dysuria, frequency, hematuria, incontinence, or discharge PSYCHIATRIC: Denies feeling of depression or anxiety. No report of hallucinations, insomnia, paranoia, or agitation     PHYSICAL EXAMINATION  GENERAL APPEARANCE: Well nourished. In no acute distress. Obese SKIN:  Chest midline surgical incision is dry, no erythema, has 4 puncture sites on epigastric area with  one of them still open (area is pinkish), covered with dry dressing HEAD: Normal in size and contour. No evidence of trauma EYES: Lids open and close normally. No blepharitis, entropion or ectropion. PERRL. Conjunctivae are clear and sclerae are white. Lenses are without opacity EARS: Pinnae are normal. Patient hears normal voice tunes of the examiner MOUTH and THROAT: Lips are without lesions. Oral mucosa is moist and without lesions. Tongue is normal in shape, size, and color and without lesions NECK: supple, trachea midline, no neck masses, no thyroid tenderness, no thyromegaly LYMPHATICS: no LAN in the neck, no supraclavicular LAN RESPIRATORY: breathing is even & unlabored, BS CTAB CARDIAC: RRR, no murmur,no extra heart sounds, no edema GI: abdomen soft, normal BS, no masses, no tenderness, no hepatomegaly, no splenomegaly EXTREMITIES;  Able to move X 4 extremities PSYCHIATRIC: Alert and oriented X 3. Affect and behavior are appropriate   LABS/RADIOLOGY: Labs reviewed: Basic Metabolic Panel:  Recent Labs  12/02/16 1800  12/03/16 0400  12/03/16 1651  12/05/16 0428 12/06/16 0208 12/08/16 0428 12/14/16  NA  --   < > 142  < >  --   < > 141 142 142 146  K  --   < > 3.8   < >  --   < > 3.6 4.3 3.8 4.4  CL  --   < > 110  < >  --   < > 110 111 111  --   CO2  --   --  23  --   --   < > 25 25 23   --   GLUCOSE  --   < > 126*  < >  --   < > 79 116* 109*  --   BUN  --   < > 14  < >  --   < > 24* 20 16 16   CREATININE 1.25*  < > 1.38*  < > 1.64*  < > 1.62* 1.61* 1.46* 1.3  CALCIUM  --   --  8.1*  --   --   < > 8.5* 8.6* 8.6*  --   MG 3.0*  --  2.9*  --  2.5*  --   --   --   --   --   < > = values in this interval not displayed. Liver Function Tests:  Recent Labs  11/18/16 1156 12/02/16 0617 12/05/16 0428  AST 24 27 27   ALT 15* 34 18  ALKPHOS 65 68 47  BILITOT 1.3* 0.6 0.6  PROT 6.9 6.8 5.0*  ALBUMIN 3.9 4.3 2.9*   CBC:  Recent Labs  11/18/16 1156  12/05/16 0428 12/06/16 0208 12/08/16 0428 12/14/16  WBC 7.5  < > 12.4* 9.7 7.2 9.8  NEUTROABS 5.7  --   --   --   --   --   HGB 11.8*  < > 7.9* 7.7* 7.8* 8.8*  HCT 35.1*  < > 23.1* 22.6* 23.4* 27*  MCV 87.5  < > 86.2 87.3 88.6  --   PLT 176  < > 125* 144* 165 244  < > = values in this interval not displayed. Lipid Panel:  Recent Labs  11/19/16 0323  HDL 27*   CBG:  Recent Labs  12/05/16 0407 12/05/16 0748 12/05/16 0806  GLUCAP 74 66 75      Dg Chest 2 View  Result Date: 12/06/2016 CLINICAL DATA:  76 year old male with a history of shortness of breath. Recent aortic valve replacement EXAM: CHEST  2 VIEW  COMPARISON:  Multiple prior chest x-ray, most recent 12/05/2016. Prior CT 11/26/2016 FINDINGS: Cardiomediastinal silhouette relatively unchanged in size and contour. Calcifications of the aortic arch. Surgical changes of median sternotomy and aortic valve replacement. Unchanged cardiac pacing device on left chest wall. Interval removal of right IJ sheath. Compared to the preoperative x-ray of 11/22/2016, there is relatively similar reticulonodular opacity at the bilateral lung bases, with low lung volumes. No large pleural effusion or pneumothorax. No interlobular septal thickening.  IMPRESSION: Compared to the preoperative chest x-ray, there is relatively similar appearance of reticulonodular opacity, most pronounced at the lung bases with low lung volumes. Findings favored to represent atelectasis and chronic lung fibrosis/scarring, however, mild edema and/or developing infection cannot be excluded. Surgical changes of median sternotomy and aortic valve replacement. Aortic atherosclerosis. Interval removal of right IJ sheath. Electronically Signed   By: Corrie Mckusick D.O.   On: 12/06/2016 08:11   Dg Chest 2 View  Result Date: 11/22/2016 CLINICAL DATA:  Pacer placement EXAM: CHEST  2 VIEW COMPARISON:  None. FINDINGS: Placement of left pacer with leads in the right atrium and right ventricle. No pneumothorax. Heart is upper limits normal in size. Low lung volumes with bibasilar atelectasis. No effusions. IMPRESSION: Left pacer placement without pneumothorax. Bibasilar atelectasis. Electronically Signed   By: Rolm Baptise M.D.   On: 11/22/2016 07:19   Dg Chest Port 1 View  Result Date: 12/05/2016 CLINICAL DATA:  76 year old male with chest pain following aortic valve replacement EXAM: PORTABLE CHEST 1 VIEW COMPARISON:  Prior chest x-ray 12/04/2016 FINDINGS: Right IJ vascular sheath remains in unchanged position with the tip of the sheath overlying the upper SVC. A left subclavian approach cardiac rhythm maintenance device is in stable position with leads projecting over the right atrium and right ventricle. Stable cardiomegaly and surgical changes of median sternotomy with aortic valve replacement. Atherosclerotic calcifications again noted throughout the transverse aorta. Pulmonary vascular congestion with mild edema. Right basilar atelectasis and small effusion. No pneumothorax. The left chest tube has been removed. No acute osseous abnormality. IMPRESSION: 1. Interval removal of left-sided chest tube. No evidence of pneumothorax or other complicating feature. 2. Otherwise, stable  appearance of the chest with persisting cardiomegaly, pulmonary edema and a small right pleural effusion with associated right basilar atelectasis. Electronically Signed   By: Jacqulynn Cadet M.D.   On: 12/05/2016 07:45   Dg Chest Port 1 View  Result Date: 12/04/2016 CLINICAL DATA:  Status post removal of patient's chest tubes. Initial encounter. EXAM: PORTABLE CHEST 1 VIEW COMPARISON:  Chest radiograph performed earlier today at 3:08 a.m. FINDINGS: The lungs are hypoexpanded. Vascular congestion is noted. Increased interstitial markings raise concern for mild interstitial edema. No definite pneumothorax is seen. The cardiomediastinal silhouette is mildly enlarged. The patient is status post median sternotomy. A valve replacement is noted. A left-sided pacemaker is noted, with leads ending overlying the right atrium and right ventricle. No acute osseous abnormalities are seen. IMPRESSION: Lungs hypoexpanded. Vascular congestion and mild cardiomegaly. Increased interstitial markings raise concern for mild interstitial edema. Electronically Signed   By: Garald Balding M.D.   On: 12/04/2016 05:34   Dg Chest Port 1 View  Result Date: 12/04/2016 CLINICAL DATA:  Status post AVR EXAM: PORTABLE CHEST 1 VIEW COMPARISON:  12/03/2016 FINDINGS: Post sternotomy changes. Left-sided duo lead pacing device. Mediastinal and chest drainage catheters remain in place. Swan-Ganz catheter has been removed, catheter sheath remains in place. There is stable cardiomegaly with central vascular congestion and mild edema,  decreased compared to prior. Left basilar atelectasis. Probable tiny pleural effusions. IMPRESSION: 1. Removal of Swan-Ganz catheter 2. Stable enlarged cardiomediastinal silhouette with central vascular congestion but decreased pulmonary edema. 3. Tiny pleural effusions. Persistent atelectasis or consolidation in the medial left lung base. Electronically Signed   By: Donavan Foil M.D.   On: 12/04/2016 03:54   Dg  Chest Port 1 View  Result Date: 12/03/2016 CLINICAL DATA:  Status post aortic valve replacement. EXAM: PORTABLE CHEST 1 VIEW COMPARISON:  12/02/2016 FINDINGS: Sequelae of aortic valve replacement are again identified. Endotracheal tube and enteric tube have been removed. Right jugular Swan-Ganz catheter overlies the main pulmonary outflow tract. Mediastinal drain and left chest tube remain in place. The cardiac silhouette remains enlarged. Pacemaker remains in place. Lung volumes remain diminished with increasing vascular congestion and interstitial densities bilaterally. Persistent retrocardiac opacity in the left lung base may reflect atelectasis. There are likely small bilateral pleural effusions. No pneumothorax is identified. IMPRESSION: Interval extubation. Cardiomegaly and low lung volumes with increasing vascular congestion and interstitial edema. Electronically Signed   By: Logan Bores M.D.   On: 12/03/2016 07:30   Dg Chest Port 1 View  Result Date: 12/02/2016 CLINICAL DATA:  Status post cardiac surgery. EXAM: PORTABLE CHEST 1 VIEW COMPARISON:  11/22/2016 FINDINGS: 1423 hours. Endotracheal tube tip 3.2 cm above the base of the carina. The NG tube passes into the stomach although the distal tip position is not included on the film. Right IJ pulmonary artery catheter tip is in the main pulmonary outflow tract. Midline fascia to midline mediastinal/pericardial drains are noted. Left chest tube identified without evidence for left-sided pneumothorax. Telemetry leads overlie the chest. Left delete permanent pacemaker remains in place. Cardiopericardial silhouette is enlarged. There is pulmonary vascular congestion without overt pulmonary edema. Retrocardiac opacity compatible with atelectasis. The visualized bony structures of the thorax are intact. IMPRESSION: 1. Cardiomegaly with low volumes and vascular congestion. 2. Support apparatus as described. 3. No evidence for pneumothorax. Electronically  Signed   By: Misty Stanley M.D.   On: 12/02/2016 14:37   Ct Angio Chest/abd/pel For Dissection W And/or W/wo  Result Date: 11/28/2016 CLINICAL DATA:  76 year old male with history of severe aortic stenosis. Preprocedural study prior to potential transcatheter aortic valve replacement (TAVR) procedure. EXAM: CT ANGIOGRAPHY CHEST, ABDOMEN AND PELVIS TECHNIQUE: Multidetector CT imaging through the chest, abdomen and pelvis was performed using the standard protocol during bolus administration of intravenous contrast. Multiplanar reconstructed images and MIPs were obtained and reviewed to evaluate the vascular anatomy. CONTRAST:  100 mL of Isovue 370. COMPARISON:  No priors. FINDINGS: CTA CHEST FINDINGS Cardiovascular: Heart size is mildly enlarged. There is no significant pericardial fluid, thickening or pericardial calcification. There is aortic atherosclerosis, as well as atherosclerosis of the great vessels of the mediastinum and the coronary arteries, including calcified atherosclerotic plaque in the left main, left anterior descending, left circumflex and right coronary arteries. Severe thickening calcifications of the aortic valve. Calcifications of the mitral annulus. Left-sided pacemaker device in position with lead tips terminating in the right atrial appendage and right ventricular apex. Mediastinum/Lymph Nodes: No pathologically enlarged mediastinal or hilar lymph nodes. Esophagus is unremarkable in appearance. No axillary lymphadenopathy. Lungs/Pleura: 14 x 8 mm (mean diameter of 11 mm) left lower lobe pulmonary nodule with some surrounding ground-glass attenuation (axial image 42 of series 6). 9 x 5 mm (mean diameter of 7 mm) subpleural nodule in the periphery of the right upper lobe (axial image 20 of series 6).  A few patchy areas of peripheral predominant ground-glass attenuation and subpleural reticulation are noted, scattered randomly throughout the lungs bilaterally, without a definitive  craniocaudal gradient. No acute consolidative airspace disease. No pleural effusions. Musculoskeletal/Soft Tissues: There are no aggressive appearing lytic or blastic lesions noted in the visualized portions of the skeleton. CTA ABDOMEN AND PELVIS FINDINGS Hepatobiliary: No suspicious cystic or solid hepatic lesions. No intra or extrahepatic biliary ductal dilatation. Gallbladder is nearly completely decompressed, but otherwise unremarkable in appearance. Pancreas: No pancreatic mass. No pancreatic ductal dilatation. No pancreatic or peripancreatic fluid or inflammatory changes. Spleen: Unremarkable. Adrenals/Urinary Tract: Multiple renal lesions are noted in the kidneys bilaterally. Although several of these lesions are low-attenuation, compatible with cysts, other lesions are intermediate to high attenuation, potentially enhancing (incompletely evaluated on today's contrast enhanced CT examination). Bilateral adrenal glands are unremarkable in appearance. No hydroureteronephrosis. Urinary bladder is normal in appearance. Stomach/Bowel: The appearance of the stomach is normal. There is no pathologic dilatation of small bowel or colon. Normal appendix. Vascular/Lymphatic: Aortic atherosclerosis, without evidence of aneurysm or dissection in the abdominal or pelvic vasculature. Vascular findings and measurements pertinent to potential TAVR procedure, as detailed below. Celiac axis, superior mesenteric artery and inferior mesenteric artery are all widely patent, without definite hemodynamically significant stenosis. Single renal arteries bilaterally are widely patent. No lymphadenopathy noted in the abdomen or pelvis. Reproductive: Prostate gland and seminal vesicles are unremarkable in appearance. Other: No significant volume of ascites.  No pneumoperitoneum. Musculoskeletal: There are no aggressive appearing lytic or blastic lesions noted in the visualized portions of the skeleton. VASCULAR MEASUREMENTS PERTINENT TO  TAVR: AORTA: Minimal Aortic Diameter -  16 x 14 mm Severity of Aortic Calcification -  moderate RIGHT PELVIS: Right Common Iliac Artery - Minimal Diameter - 11.0 x 13.0 mm Tortuosity - mild Calcification - mild Right External Iliac Artery - Minimal Diameter - 9.4 x 8.8 mm Tortuosity - severe Calcification - none Right Common Femoral Artery - Minimal Diameter - 8.9 x 10.0 mm Tortuosity - mild Calcification - mild LEFT PELVIS: Left Common Iliac Artery - Minimal Diameter - 13.3 x 12.3 mm Tortuosity - mild Calcification - mild Left External Iliac Artery - Minimal Diameter - 9.8 x 9.9 mm Tortuosity - severe Calcification - minimal Left Common Femoral Artery - Minimal Diameter - 9.8 x 10.7 mm Tortuosity - mild Calcification - none Review of the MIP images confirms the above findings. IMPRESSION: 1. Vascular findings and measurements pertinent to potential TAVR procedure, as detailed above. Patient does appear to have suitable pelvic arterial access bilaterally. 2. Severe thickening calcification of the aortic valve, compatible with the reported clinical history of severe aortic stenosis. 3. Pulmonary nodules in the lungs bilaterally, the largest of which has a mean diameter of 11 mm in the left lower lobe (axial image 42 of series 6). Non-contrast chest CT at 3-6 months is recommended. If the nodules are stable at time of repeat CT, then future CT at 18-24 months (from today's scan) is considered optional for low-risk patients, but is recommended for high-risk patients. This recommendation follows the consensus statement: Guidelines for Management of Incidental Pulmonary Nodules Detected on CT Images: From the Fleischner Society 2017; Radiology 2017; 284:228-243. 4. Aortic atherosclerosis, in addition to left main and 3 vessel coronary artery disease. Assessment for potential risk factor modification, dietary therapy or pharmacologic therapy may be warranted, if clinically indicated. 5. **An incidental finding of  potential clinical significance has been found. Innumerable renal lesions bilaterally. Unfortunately, many of  these are not characterized on today's examination. Any one or more of these could represent solid renal neoplasm(s). If the patient's pacemaker is MRI compatible, further evaluation with nonemergent MRI of the abdomen with and without IV gadolinium would be recommended in the near future to exclude underlying neoplasm. Alternatively, CT of the abdomen with and without IV gadolinium could be performed if pacemaker is not MRI compatible. ** 6. Additional incidental findings, as above. Electronically Signed   By: Vinnie Langton M.D.   On: 11/28/2016 14:46    ASSESSMENT/PLAN:  Generalized weakness - for Home health PT and OT, for therapeutic stregthening exercises; fall precautions  Left atrial mass  - S/P resection; continue Norvasc 10 mg 1 tab by mouth daily, metoprolol titrate 25 mg 1 tab by mouth twice a day; continue tramadol 50 mg 1 by mouth every 6 hours when necessary for pain; follow-up with cardiothoracic surgery and cardiology     Complete heart block - S/P aortic valve replacement with a 23 mm Edwards intuity rapid deployment pericardial tissue valve; continue Norvasc 10 mg 1 tab by mouth daily, metoprolol titrate 25 mg 1 tab by mouth twice a day; continue tramadol 50 mg 1 by mouth every 6 hours when necessary for pain; follow-up with cardiothoracic surgery and cardiology   Essential hypertension - continue Norvasc 10 mg 1 tab by mouth daily, metoprolol titrate 25 mg 1 tab by mouth twice a day  Mild Dementia - continue supportive care, fall precautions  Chronic Kidney disease, stage III - stable Lab Results  Component Value Date   CREATININE 1.3 12/14/2016   Hyperlipidemia - continue Simvastatin 20 mg 1 tab PO Q HS  Dry eyes - continue Artificial tears 1 gtt to both eyes daily PRN   Anemia, acute blood loss - improved Lab Results  Component Value Date   HGB 8.8 (A)  12/14/2016   Hx of TIA - continue ASA EC 325 mg PO Q D and Simvastatin 20 mg 1 tab PO Q HS  Diabetes mellitus, type 2 - diet-controlled Lab Results  Component Value Date   HGBA1C 6.2 (H) 11/19/2016       I have filled out patient's discharge paperwork and written prescriptions.  Patient will receive home health PT and OT.  DME provided:  None  Total discharge time: Less than 30 minutes  Discharge time involved coordination of the discharge process with social worker, nursing staff and therapy department. Medical justification for home health services verified.    Monina C. Harper - NP    [employer] [phone]

## 2016-12-20 NOTE — Telephone Encounter (Signed)
Informed James Curtis that from EP standpoint, pt would not need PT/OT post PPM implant.  Advised to contact surgeon's office who recently discharged patient post left atrial mass resection and AV replacement. James Curtis is agreeable to contacting there office.

## 2016-12-20 NOTE — Telephone Encounter (Signed)
New Message:    James Curtis needs to know if Dr Curt Bears is the doctor who is following pt's PT and OT orders.

## 2016-12-22 ENCOUNTER — Encounter: Payer: Self-pay | Admitting: Physician Assistant

## 2016-12-22 DIAGNOSIS — Z48812 Encounter for surgical aftercare following surgery on the circulatory system: Secondary | ICD-10-CM | POA: Diagnosis not present

## 2016-12-22 DIAGNOSIS — Z8673 Personal history of transient ischemic attack (TIA), and cerebral infarction without residual deficits: Secondary | ICD-10-CM | POA: Diagnosis not present

## 2016-12-22 DIAGNOSIS — Z952 Presence of prosthetic heart valve: Secondary | ICD-10-CM | POA: Diagnosis not present

## 2016-12-22 DIAGNOSIS — N183 Chronic kidney disease, stage 3 (moderate): Secondary | ICD-10-CM | POA: Diagnosis not present

## 2016-12-22 DIAGNOSIS — E1122 Type 2 diabetes mellitus with diabetic chronic kidney disease: Secondary | ICD-10-CM | POA: Diagnosis not present

## 2016-12-22 DIAGNOSIS — F0390 Unspecified dementia without behavioral disturbance: Secondary | ICD-10-CM | POA: Diagnosis not present

## 2016-12-22 DIAGNOSIS — R911 Solitary pulmonary nodule: Secondary | ICD-10-CM | POA: Diagnosis not present

## 2016-12-22 DIAGNOSIS — I2510 Atherosclerotic heart disease of native coronary artery without angina pectoris: Secondary | ICD-10-CM | POA: Diagnosis not present

## 2016-12-22 DIAGNOSIS — M6281 Muscle weakness (generalized): Secondary | ICD-10-CM | POA: Diagnosis not present

## 2016-12-22 DIAGNOSIS — I129 Hypertensive chronic kidney disease with stage 1 through stage 4 chronic kidney disease, or unspecified chronic kidney disease: Secondary | ICD-10-CM | POA: Diagnosis not present

## 2016-12-23 DIAGNOSIS — I2510 Atherosclerotic heart disease of native coronary artery without angina pectoris: Secondary | ICD-10-CM | POA: Diagnosis not present

## 2016-12-23 DIAGNOSIS — E1122 Type 2 diabetes mellitus with diabetic chronic kidney disease: Secondary | ICD-10-CM | POA: Diagnosis not present

## 2016-12-23 DIAGNOSIS — Z48812 Encounter for surgical aftercare following surgery on the circulatory system: Secondary | ICD-10-CM | POA: Diagnosis not present

## 2016-12-23 DIAGNOSIS — N183 Chronic kidney disease, stage 3 (moderate): Secondary | ICD-10-CM | POA: Diagnosis not present

## 2016-12-23 DIAGNOSIS — I129 Hypertensive chronic kidney disease with stage 1 through stage 4 chronic kidney disease, or unspecified chronic kidney disease: Secondary | ICD-10-CM | POA: Diagnosis not present

## 2016-12-23 DIAGNOSIS — M6281 Muscle weakness (generalized): Secondary | ICD-10-CM | POA: Diagnosis not present

## 2016-12-26 ENCOUNTER — Encounter: Payer: Self-pay | Admitting: Physician Assistant

## 2016-12-26 ENCOUNTER — Ambulatory Visit (INDEPENDENT_AMBULATORY_CARE_PROVIDER_SITE_OTHER): Payer: Medicare Other | Admitting: Physician Assistant

## 2016-12-26 VITALS — BP 132/70 | HR 90 | Ht 67.0 in | Wt 209.0 lb

## 2016-12-26 DIAGNOSIS — N183 Chronic kidney disease, stage 3 (moderate): Secondary | ICD-10-CM | POA: Diagnosis not present

## 2016-12-26 DIAGNOSIS — N2889 Other specified disorders of kidney and ureter: Secondary | ICD-10-CM

## 2016-12-26 DIAGNOSIS — Z950 Presence of cardiac pacemaker: Secondary | ICD-10-CM

## 2016-12-26 DIAGNOSIS — R911 Solitary pulmonary nodule: Secondary | ICD-10-CM | POA: Diagnosis not present

## 2016-12-26 DIAGNOSIS — I10 Essential (primary) hypertension: Secondary | ICD-10-CM

## 2016-12-26 DIAGNOSIS — M6281 Muscle weakness (generalized): Secondary | ICD-10-CM | POA: Diagnosis not present

## 2016-12-26 DIAGNOSIS — Z953 Presence of xenogenic heart valve: Secondary | ICD-10-CM

## 2016-12-26 DIAGNOSIS — I2510 Atherosclerotic heart disease of native coronary artery without angina pectoris: Secondary | ICD-10-CM | POA: Diagnosis not present

## 2016-12-26 DIAGNOSIS — E1122 Type 2 diabetes mellitus with diabetic chronic kidney disease: Secondary | ICD-10-CM | POA: Diagnosis not present

## 2016-12-26 DIAGNOSIS — I129 Hypertensive chronic kidney disease with stage 1 through stage 4 chronic kidney disease, or unspecified chronic kidney disease: Secondary | ICD-10-CM | POA: Diagnosis not present

## 2016-12-26 DIAGNOSIS — Z48812 Encounter for surgical aftercare following surgery on the circulatory system: Secondary | ICD-10-CM | POA: Diagnosis not present

## 2016-12-26 NOTE — Patient Instructions (Addendum)
Medication Instructions:  No changes. Ask Dr. Roxy Manns how long he wants you on Aspirin 325 mg Once daily.  Once he tells you that you no longer need that dose, we would recommend you remain on Aspirin 81 mg Once daily   Labwork: None   Testing/Procedures: Schedule an echocardiogram in 4 weeks (around January 26, 2017).  Follow-Up: Dr. [name] End in 6 weeks.  Dr. Allegra Lai in July as planned.  Any Other Special Instructions Will Be Listed Below (If Applicable). When you see your PCP tomorrow, follow up with him regarding the lung nodules and kidney nodules.  You will need a follow up scan to recheck these. Once the home health physical therapist finishes with you, let us know so we can get you into outpatient cardiac rehabilitation.  If you need a refill on your cardiac medications before your next appointment, please call your pharmacy.

## 2016-12-26 NOTE — Progress Notes (Signed)
Cardiology Office Note:    Date:  12/26/2016   ID:  James Curtis, DOB 03/28/1941, MRN [id number]  PCP:  James Carol, MD  Cardiologist:  Dr. Harrell Curtis End   Electrophysiologist: Dr. Allegra Curtis   Referring MD: James Carol, MD   Chief Complaint  Patient presents with  . Hospitalization Follow-up    s/p L atrial mass resection; s/p AVR    History of Present Illness:    James Curtis is a 76 y.o. male with a hx of HTN, HL, diet-controlled diabetes, tobacco abuse.  Admitted 4/6-4/14. He presented with symptoms consistent with a TIA and was found to be in complete heart block with a heart rate of 40. MRI was negative for acute intracranial abnormality. There was a small chronic lacunar infarct in the left hemipons. Echo demonstrated moderate aortic stenosis, moderate mitral stenosis and mild mitral regurgitation. TEE demonstrated a 16 x 4 mm mobile calcified echodensity on the atrial basal surface of the posterior mitral valve. Blood cultures were negative. Patient was seen by cardiac surgery. 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 It was felt that surgical intervention should be undertaken given recent TIA from a possible embolic source.  Due to complete heart block, he underwent pacemaker implantation with dual-chamber device. He underwent cardiac catheterization that demonstrated mild nonobstructive disease. ABIs demonstrated mild to moderate disease on the right. Carotid US demonstrated minimal plaque bilaterally (1-39% ICA stenosis).  After DC, he followed up with Dr. Roxy Curtis.  Surgical intervention was planned for removal of the L atrial mass. It was felt that aortic valve replacement should be undertaken at the time of left atrial mass excision due to the risk of progressive aortic stenosis in the future combined with increased risk of perioperative morbidity and mortality.  He was admitted 4/20-4/27. He underwent resection of the left atrial mass along with  bioprosthetic AVR. Postoperative course was complicated by delirium, worsening creatinine as well as volume excess. He also suffered urethral trauma from self discontinuation of his Foley catheter. Of note, pathology of his left atrial mass was consistent with calcified fibrotic nodule and no evidence of malignancy.  He was DC to [facility].    He returns for post hospitalization follow-up.  He returns with his son.  James Curtis was DC from [facility] last week.  He denies much chest soreness.  He denies significant shortness of breath. He denies orthopnea, PND, edema.  He denies dizziness, syncope.  He feels his appetite is good.  He denies melena, hematochezia.  He denies fever or significant cough.   Prior CV studies:   The following studies were reviewed today:  Intraoperative TEE 12/02/16 Severe concentric LVH, EF 60-65, mild to moderate aortic stenosis, mild MR  R/L heart cath 11/25/16 LAD irregularities LCx mild disease; OM3 40 RCA proximal 50 RA 4, mean PA 19, PCWP mean 9, PVR 1.3 WU Mild to mod AS - Mean AV gradient 22, AVA 2 cm; mean gradient on pullback 16, AVA 2.3 cm  ABIs 11/25/16 ABI&'s of the right lower extremity indicate mild/boarderline moderate peripheral arterial disease with monophasic flow. ABI&'s of the left lower extremity are within normal limits however the dorsal pedis demonstrates dampened monophasic flow.  R 0.8' L 1.06  TEE 11/23/16 Severe concentric LVH, EF 60-65, MAC, 16 x 4 mm mobile echodensity on atrial basal surface of posterior mitral valve, mild MR  Carotid US 11/20/16 Bilateral ICA 1-39  Echo 11/20/16 Mild concentric LVH, vigorous LVF, EF 65-70, normal  wall motion, grade 1 diastolic dysfunction, moderate aortic stenosis (mean 26, peak 62), severe MAC, moderate mitral stenosis (mean 5), mild MR, 12 x 7 mm mobile echodensity attached to the posterior mitral valve leaflet, mild LAE, mild TR  Chest/Abd/Pelvic CTA 11/26/16 IMPRESSION: 1.  Vascular findings and measurements pertinent to potential TAVR procedure, as detailed above. Patient does appear to have suitable pelvic arterial access bilaterally. 2. Severe thickening calcification of the aortic valve, compatible with the reported clinical history of severe aortic stenosis. 3. Pulmonary nodules in the lungs bilaterally, the largest of which has a mean diameter of 11 mm in the left lower lobe (axial image 42 of series 6). Non-contrast chest CT at 3-6 months is recommended. If the nodules are stable at time of repeat CT, then future CT at 18-24 months (from today's scan) is considered optional for low-risk patients, but is recommended for high-risk patients. This recommendation follows the consensus statement: Guidelines for Management of Incidental Pulmonary Nodules Detected on CT Images: From the Fleischner Society 2017; Radiology 2017; 284:228-243. 4. Aortic atherosclerosis, in addition to left main and 3 vessel coronary artery disease. Assessment for potential risk factor modification, dietary therapy or pharmacologic therapy may be warranted, if clinically indicated. 5. **An incidental finding of potential clinical significance has been found. Innumerable renal lesions bilaterally. Unfortunately, many of these are not characterized on today's examination. Any one or more of these could represent solid renal neoplasm(s). If the patient's pacemaker is MRI compatible, further evaluation with nonemergent MRI of the abdomen with and without IV gadolinium would be recommended in the near future to exclude underlying neoplasm. Alternatively, CT of the abdomen with and without IV gadolinium could be performed if pacemaker is not MRI compatible. ** 6. Additional incidental findings, as above.  Past Medical History:  Diagnosis Date  . Aortic stenosis    a. 11/20/2016 Echo: mod AS;  b. 11/23/2016 TEE restricted mobility of noncoronary cusp;  c. 11/2016 Cath: mean grad 19mHg  w/ dual lumen catheter, AVA 2.0cm^2; mean gradient by pullback = 136mg, AVA 2.3cm^2.  . Carotid arterial disease ([facility]   a. 11/2016 Carotid U/S: bilat 1-39% ICA stenosis.  . Chronic kidney disease   . Complete heart block ([facility]   a. 11/21/2016 s/p MDT AzSantina EvansR MRI SureScan (ser # [id number]).  . Dementia    mild  . Family history of adverse reaction to anesthesia    Son [name] a difficult time waking up  . Heart murmur   . Hypercholesteremia   . Hypertension    benign  . Incidental pulmonary nodule, greater than or equal to 76m77m needs f/u scan 3 months 11/28/2016   14 x 8 mm (mean diameter of 11 mm) left lower lobe pulmonary nodule with some surrounding ground-glass attenuation (axial image 42 of series 6). 9 x 5 mm (mean diameter of 7 mm) subpleural nodule in the periphery of the right upper lobe (axial image 20 of series 6). A few patchy areas of peripheral predominant ground-glass attenuation and subpleural reticulation are noted, scattered randomly throu  . Left atrial mass   . 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 11/2009 TEE: EF 60-65%, restricted mobility of noncoronary AoV cusp, 16x4 mm mobile Ca2+ echodensity on the atrial basal surface of the posterior MV leaflet, mild MR, no LA/LAA thrombus.  . Non-obstructive CAD (coronary artery disease)    a. 11/2016 Cath: LM large, nl, LAD min irregs, LCX large, min irregs, OM3 40,  RCA large, 30m   . Pre-diabetes   . Prostate cancer ([facility]    s/p radiation  . Renal mass of unknown nature - needs f/u MRI 11/28/2016   Multiple renal lesions are noted in the kidneys bilaterally. Although several of these lesions are low-attenuation, compatible with cysts, other lesions are intermediate to high attenuation, potentially enhancing  . S/P aortic valve replacement with bioprosthetic valve 12/02/2016   23 mm Edwards Intuity rapid deployment bovine pericardial tissue valve  . S/P resection of left atrial mass 12/02/2016   Benign  dystrophic fibrotic tissue with calcification attached to posterior mitral annulus by thin fibrotic stalk  . TIA (transient ischemic attack)    a. 11/2016 in setting of complete heart block.    Past Surgical History:  Procedure Laterality Date  . AORTIC VALVE REPLACEMENT N/A 12/02/2016   Procedure: AORTIC VALVE REPLACEMENT (AVR) using a 23 Edwards Intuity Elite Aortic Valve;  Surgeon: CRexene Alberts MD;  [place] [facility]  Service: Open Heart Surgery;  Laterality: N/A;  . EXCISION OF ATRIAL MYXOMA N/A 12/02/2016   Procedure: Resection of left atrial mass ;  Surgeon: CRexene Alberts MD;  [place] [facility]  Service: Open Heart Surgery;  Laterality: N/A;  . PACEMAKER IMPLANT N/A 11/21/2016   Procedure: Pacemaker Implant;  Surgeon: Will MMeredith Leeds MD;  [facility];  Service: Cardiovascular;  Laterality: N/A;  . RIGHT/LEFT HEART CATH AND CORONARY ANGIOGRAPHY N/A 11/25/2016   Procedure: Right/Left Heart Cath and Coronary Angiography;  Surgeon: CNelva Bush MD;  [facility];  Service: Cardiovascular;  Laterality: N/A;  . TEE WITHOUT CARDIOVERSION N/A 11/23/2016   Procedure: TRANSESOPHAGEAL ECHOCARDIOGRAM (TEE);  Surgeon: MJerline Pain MD;  [place] [facility]  Service: Cardiovascular;  Laterality: N/A;  . TEE WITHOUT CARDIOVERSION N/A 12/02/2016   Procedure: TRANSESOPHAGEAL ECHOCARDIOGRAM (TEE);  Surgeon: CRexene Alberts MD;  [place] [facility]  Service: Open Heart Surgery;  Laterality: N/A;    Current Medications: Current Meds  Medication Sig  . amLODipine (NORVASC) 10 MG tablet Take 10 mg by mouth daily.  .Marland KitchenApoaequorin (PREVAGEN PO) Take 1 tablet by mouth daily. For Brain and Memory Health - OTC  . aspirin EC 325 MG tablet Take 1 tablet (325 mg total) by mouth daily.  . cholecalciferol (VITAMIN D) 1000 units tablet Take 1,000 Units by mouth daily.  . iron polysaccharides (NIFEREX) 150 MG capsule Take 1 capsule (150 mg total) by mouth daily.  .  metoprolol tartrate (LOPRESSOR) 25 MG tablet Take 1 tablet (25 mg total) by mouth 2 (two) times daily.  . simvastatin (ZOCOR) 20 MG tablet Take 20 mg by mouth at bedtime.   . Tetrahydrozoline HCl (VISINE OP) Apply to eye as directed.     Allergies:   Patient has no known allergies.   Social History   Social History  . Marital status: Widowed    Spouse name: N/A  . Number of children: 2  . Years of education: 12   Occupational History  .      retired   Social History Main Topics  . Smoking status: Former Smoker    Packs/day: 0.25    Types: Cigarettes    Quit date: 11/16/2016  . Smokeless tobacco: Never Used  . Alcohol use No  . Drug use: No  . Sexual activity: Not Currently   Other Topics Concern  . None   Social History Narrative   Lives alone   Caffeine  Family Hx: The patient's family history is not on file.  ROS:   Please see the history of present illness.    Review of Systems  Constitution: Positive for malaise/fatigue.  Respiratory: Positive for cough.   Psychiatric/Behavioral: Positive for depression. The patient is nervous/anxious.    All other systems reviewed and are negative.   EKGs/Labs/Other Test Reviewed:    EKG:  EKG is ordered today.  The ekg ordered today demonstrates NSR, ventricular paced, HR 90  Recent Labs: 12/03/2016: Magnesium 2.5 12/05/2016: ALT 18 12/14/2016: BUN 16; Creatinine 1.3; Hemoglobin 8.8; Platelets 244; Potassium 4.4; Sodium 146   Recent Lipid Panel    Component Value Date/Time   CHOL 106 11/19/2016 0323   TRIG 97 11/19/2016 0323   HDL 27 (L) 11/19/2016 0323   CHOLHDL 3.9 11/19/2016 0323   VLDL 19 11/19/2016 0323   LDLCALC 60 11/19/2016 0323     Physical Exam:    VS:  BP 132/70   Pulse 90   Ht _0  (1.702 m)   Wt 209 lb (94.8 kg)   BMI 32.73 kg/m     Wt Readings from Last 3 Encounters:  12/26/16 209 lb (94.8 kg)  12/20/16 214 lb 12.8 oz (97.4 kg)  12/13/16 214 lb 12.8 oz (97.4 kg)     Physical Exam    Constitutional: He is oriented to person, place, and time. He appears well-developed and well-nourished. No distress.  HENT:  Head: Normocephalic and atraumatic.  Eyes: No scleral icterus.  Neck: Normal range of motion. No JVD present.  Cardiovascular: Normal rate, regular rhythm, S1 normal and S2 normal.   Murmur heard.  Harsh early systolic murmur is present with a grade of 2/6  at the upper right sternal border, upper left sternal border Pulmonary/Chest: Effort normal and breath sounds normal. He has no wheezes. He has no rhonchi. He has no rales.  Median sternotomy well healed without erythema or discharge  Abdominal: Soft. There is no tenderness.  Musculoskeletal: He exhibits no edema.  Neurological: He is alert and oriented to person, place, and time.  Skin: Skin is warm and dry.  Psychiatric: He has a normal mood and affect.    ASSESSMENT:    1. S/P resection of left atrial mass and aortic valve replacement with bioprosthetic valve   2. Cardiac pacemaker in situ   3. Coronary artery disease involving native coronary artery of native heart without angina pectoris   4. Incidental pulmonary nodule, greater than or equal to 39m - needs f/u scan 3 months   5. Renal mass of unknown nature - needs f/u MRI   6. Essential hypertension    PLAN:    In order of problems listed above:  1. S/P resection of left atrial mass and aortic valve replacement with bioprosthetic valve - Left atrial mass was not malignant. He is progressing slowly. He has home health physical therapy currently coming to his house. I explained to him and his son that once home health physical therapy has finished, he should proceed with outpatient cardiac rehabilitation. We discussed the importance of SBE prophylaxis. He sees Dr. ORoxy Mannsnext week.  -  Arrange follow-up echocardiogram in approximately 4 weeks  2. Cardiac pacemaker in situ - Implanted for heart block.  FU with EP as planned.   3. Coronary artery  disease involving native coronary artery of native heart without angina pectoris - Mild non-obstructive disease on LHC.  Continue ASA and statin.  Reduce ASA to 81 QD once ok  with TCTS.  4. Incidental pulmonary nodule, greater than or equal to 66m - needs f/u scan 3 months - Pulmonary nodule noted on CT prior to surgery.  He sees his PCP tomorrow.  FU CT will need to be arranged in 3 mos.  This can be followed by his PCP.   5. Renal mass of unknown nature - needs f/u MRI - As noted, he had several bilateral renal lesions.  He needs a follow up scan and this can be arranged through primary care.   6. Essential hypertension - The patient's blood pressure is controlled on his current regimen.  Continue current therapy.    Dispo:  Return in about 6 weeks (around 02/06/2017) for Routine Follow Up, w/ Dr. ESaunders Revel   Medication Adjustments/Labs and Tests Ordered: Current medicines are reviewed at length with the patient today.  Concerns regarding medicines are outlined above.  Orders/Tests:  Orders Placed This Encounter  Procedures  . EKG 12-Lead  . ECHOCARDIOGRAM COMPLETE   Medication changes: No orders of the defined types were placed in this encounter.  Signed, SRichardson Dopp PA-C  12/26/2016 [facility] [address] [place]  [id number]: [phone] Fax: [phone]

## 2016-12-27 ENCOUNTER — Telehealth (HOSPITAL_COMMUNITY): Payer: Self-pay

## 2016-12-27 DIAGNOSIS — I519 Heart disease, unspecified: Secondary | ICD-10-CM | POA: Diagnosis not present

## 2016-12-27 DIAGNOSIS — Z950 Presence of cardiac pacemaker: Secondary | ICD-10-CM | POA: Diagnosis not present

## 2016-12-27 DIAGNOSIS — G459 Transient cerebral ischemic attack, unspecified: Secondary | ICD-10-CM | POA: Diagnosis not present

## 2016-12-27 DIAGNOSIS — Z48812 Encounter for surgical aftercare following surgery on the circulatory system: Secondary | ICD-10-CM | POA: Diagnosis not present

## 2016-12-27 DIAGNOSIS — N183 Chronic kidney disease, stage 3 (moderate): Secondary | ICD-10-CM | POA: Diagnosis not present

## 2016-12-27 DIAGNOSIS — N2889 Other specified disorders of kidney and ureter: Secondary | ICD-10-CM | POA: Diagnosis not present

## 2016-12-27 DIAGNOSIS — F0390 Unspecified dementia without behavioral disturbance: Secondary | ICD-10-CM | POA: Diagnosis not present

## 2016-12-27 DIAGNOSIS — I129 Hypertensive chronic kidney disease with stage 1 through stage 4 chronic kidney disease, or unspecified chronic kidney disease: Secondary | ICD-10-CM | POA: Diagnosis not present

## 2016-12-27 DIAGNOSIS — Z952 Presence of prosthetic heart valve: Secondary | ICD-10-CM | POA: Diagnosis not present

## 2016-12-27 DIAGNOSIS — I442 Atrioventricular block, complete: Secondary | ICD-10-CM | POA: Diagnosis not present

## 2016-12-27 DIAGNOSIS — E1122 Type 2 diabetes mellitus with diabetic chronic kidney disease: Secondary | ICD-10-CM | POA: Diagnosis not present

## 2016-12-27 DIAGNOSIS — M6281 Muscle weakness (generalized): Secondary | ICD-10-CM | POA: Diagnosis not present

## 2016-12-27 DIAGNOSIS — I2510 Atherosclerotic heart disease of native coronary artery without angina pectoris: Secondary | ICD-10-CM | POA: Diagnosis not present

## 2016-12-27 NOTE — Telephone Encounter (Signed)
Patient in no longer in SNF. Per Carlette, ok to schedule patient. I called and left message on patient voicemail to call office and schedule orientation and cardiac rehab classes. I left my contact information on patient voicemail to return my call.

## 2016-12-29 ENCOUNTER — Telehealth (HOSPITAL_COMMUNITY): Payer: Self-pay

## 2016-12-29 DIAGNOSIS — Z48812 Encounter for surgical aftercare following surgery on the circulatory system: Secondary | ICD-10-CM | POA: Diagnosis not present

## 2016-12-29 DIAGNOSIS — E1122 Type 2 diabetes mellitus with diabetic chronic kidney disease: Secondary | ICD-10-CM | POA: Diagnosis not present

## 2016-12-29 DIAGNOSIS — I129 Hypertensive chronic kidney disease with stage 1 through stage 4 chronic kidney disease, or unspecified chronic kidney disease: Secondary | ICD-10-CM | POA: Diagnosis not present

## 2016-12-29 DIAGNOSIS — I2510 Atherosclerotic heart disease of native coronary artery without angina pectoris: Secondary | ICD-10-CM | POA: Diagnosis not present

## 2016-12-29 DIAGNOSIS — N183 Chronic kidney disease, stage 3 (moderate): Secondary | ICD-10-CM | POA: Diagnosis not present

## 2016-12-29 DIAGNOSIS — M6281 Muscle weakness (generalized): Secondary | ICD-10-CM | POA: Diagnosis not present

## 2016-12-29 NOTE — Telephone Encounter (Signed)
Patient son Ronalee Belts) called and scheduled orientation and cardiac rehab classes.

## 2016-12-30 ENCOUNTER — Other Ambulatory Visit: Payer: Self-pay | Admitting: Thoracic Surgery (Cardiothoracic Vascular Surgery)

## 2016-12-30 DIAGNOSIS — Z953 Presence of xenogenic heart valve: Secondary | ICD-10-CM

## 2017-01-02 ENCOUNTER — Encounter: Payer: Self-pay | Admitting: Thoracic Surgery (Cardiothoracic Vascular Surgery)

## 2017-01-02 ENCOUNTER — Ambulatory Visit
Admission: RE | Admit: 2017-01-02 | Discharge: 2017-01-02 | Disposition: A | Discharge status: Home / Self Care | Payer: Medicare Other | Source: Ambulatory Visit | Attending: Thoracic Surgery (Cardiothoracic Vascular Surgery) | Admitting: Thoracic Surgery (Cardiothoracic Vascular Surgery)

## 2017-01-02 ENCOUNTER — Ambulatory Visit (INDEPENDENT_AMBULATORY_CARE_PROVIDER_SITE_OTHER): Payer: Self-pay | Admitting: Thoracic Surgery (Cardiothoracic Vascular Surgery)

## 2017-01-02 VITALS — BP 133/77 | HR 62 | Resp 20 | Ht 67.0 in | Wt 211.0 lb

## 2017-01-02 DIAGNOSIS — I517 Cardiomegaly: Secondary | ICD-10-CM | POA: Diagnosis not present

## 2017-01-02 DIAGNOSIS — I350 Nonrheumatic aortic (valve) stenosis: Secondary | ICD-10-CM

## 2017-01-02 DIAGNOSIS — Z952 Presence of prosthetic heart valve: Secondary | ICD-10-CM

## 2017-01-02 DIAGNOSIS — I5189 Other ill-defined heart diseases: Secondary | ICD-10-CM

## 2017-01-02 DIAGNOSIS — Z953 Presence of xenogenic heart valve: Secondary | ICD-10-CM

## 2017-01-02 DIAGNOSIS — I519 Heart disease, unspecified: Secondary | ICD-10-CM

## 2017-01-02 NOTE — Patient Instructions (Signed)
Continue all previous medications without any changes at this time  Continue to avoid any heavy lifting or strenuous use of your arms or shoulders for at least a total of three months from the time of surgery.  After three months you may gradually increase how much you lift or otherwise use your arms or chest as tolerated, with limits based upon whether or not activities lead to the return of significant discomfort.  You are encouraged to enroll and participate in the outpatient cardiac rehab program beginning as soon as practical.  I would not recommend driving an automobile at this time but you may travel with your family

## 2017-01-02 NOTE — Progress Notes (Signed)
[address]       [place] [address]             [phone]     CARDIOTHORACIC SURGERY OFFICE NOTE  Referring Provider is Constance Haw, MD  Primary Cardiologist is End, [name], MD PCP is [name], MD   HPI:  Patient is a 76 year old African-American male with history of aortic stenosis, complete heart block status post permanent pacemaker placement, long-standing hypertension, hyperlipidemia, tobacco abuse, type 2 diabetes mellitus, and mild dementia who returns to the office today for routine follow-up status post resection of left atrial mass which turned out to be benign dystrophic fibrotic tissue with calcification and aortic valve replacement using a rapid deployment bovine pericardial tissue valve on 12/02/2016.  The patient's early postoperative recovery in the hospital following surgery was notable for mild exacerbation of confusion, agitation, and delirium which gradually resolved. He did develop gross hematuria after he removed his own Foley catheter without deflating the balloon, but he was able to void urine uneventfully and hematuria resolved.  He otherwise recovered uneventfully and was eventually discharged to a local skilled nursing facility on the seventh postoperative day. He reportedly stayed at the skilled nursing facility for less than 2 weeks, leaving because his family was not happy with the care he received. He has been home ever since and overall doing well. He was seen in follow-up by [name] at [facility] last week and he returns to our office for routine follow-up today. He is accompanied by both of his sons for his office visit. The patient reports no significant pain in his chest. He has been sleeping well at night and according to his family he sleeps off and on a fair amount during the daytime. He is eating well and bowel function is normal. He denies any shortness of breath. He admits that he is not walking very  much but he has made plans to begin the outpatient cardiac rehabilitation program next week. Overall he looks and feels remarkably well. His family notes that he is doing well although his cognitive function is not completely back to his baseline. Overall they are pleased with his progress.   Current Outpatient Prescriptions  Medication Sig Dispense Refill  . amLODipine (NORVASC) 10 MG tablet Take 10 mg by mouth daily.  11  . Apoaequorin (PREVAGEN PO) Take 1 tablet by mouth daily. For Brain and Memory Health - OTC    . aspirin EC 325 MG tablet Take 1 tablet (325 mg total) by mouth daily.    . cholecalciferol (VITAMIN D) 1000 units tablet Take 1,000 Units by mouth daily.    . iron polysaccharides (NIFEREX) 150 MG capsule Take 1 capsule (150 mg total) by mouth daily.    . metoprolol tartrate (LOPRESSOR) 25 MG tablet Take 1 tablet (25 mg total) by mouth 2 (two) times daily.    . simvastatin (ZOCOR) 20 MG tablet Take 20 mg by mouth at bedtime.     . Tetrahydrozoline HCl (VISINE OP) Apply to eye as directed.    . valsartan (DIOVAN) 320 MG tablet Take 320 mg by mouth daily.     No current facility-administered medications for this visit.       Physical Exam:   BP 133/77   Pulse 62   Resp 20   Ht 5\' 7"  (1.702 m)   Wt 211 lb (95.7 kg)   SpO2 95%   BMI 33.05 kg/m   General:  Well-appearing  Chest:   Clear to auscultation with symmetrical breath sounds  CV:   Regular rate and rhythm without murmur  Incisions:  Healing nicely, sternum is stable  Abdomen:  Soft and nontender  Extremities:  Warm and well-perfused, no lower extremity edema  Diagnostic Tests:  CHEST  2 VIEW  COMPARISON:  12/06/2016  FINDINGS: Cardiomegaly. Status post aortic valve replacement. Dual-chamber pacer leads from the left in unremarkable position. Improved aeration since prior. There may be a background of chronic lung disease with interstitial coarsening. Subpleural reticulation was seen on abdominal CT  11/26/2016. Exaggerated thoracic kyphosis.  IMPRESSION: 1. No acute finding.  Improved aeration since 12/06/2016. 2. Stable postoperative heart size.   Electronically Signed   By: Monte Fantasia M.D.   On: 01/02/2017 15:49    Impression:  Patient is doing remarkably well approximately one month status post resection of benign left atrial mass and aortic valve replacement using a bioprosthetic tissue valve.  Plan:  We have not recommended any changes to the patient's current medications. Typically following valve replacement using a bioprosthetic tissue valve would recommend aspirin 325 mg daily for 3 months. After that aspirin dose can be cut back to 81 mg if desired. I've encouraged the patient to continue to gradually increase his physical activity as tolerated but to refrain from any heavy lifting or strenuous use of his arms or shoulders for least another 2 months. I'm hopeful that he will do well with the outpatient cardiac rehabilitation program. Because of his baseline cognitive issues I would be reluctant to recommend that the patient return to driving an automobile. Because of the patient's baseline dementia, this is something that may need to be addressed for the long-term future by the patient's primary care physician.  The patient will return to our office in 2-3 months for routine follow-up. He or his family will call and return sooner should specific problems or questions arise.    Valentina Gu. Roxy Manns, MD 01/02/2017 4:15 PM

## 2017-01-03 DIAGNOSIS — M6281 Muscle weakness (generalized): Secondary | ICD-10-CM | POA: Diagnosis not present

## 2017-01-03 DIAGNOSIS — I129 Hypertensive chronic kidney disease with stage 1 through stage 4 chronic kidney disease, or unspecified chronic kidney disease: Secondary | ICD-10-CM | POA: Diagnosis not present

## 2017-01-03 DIAGNOSIS — H2513 Age-related nuclear cataract, bilateral: Secondary | ICD-10-CM | POA: Diagnosis not present

## 2017-01-03 DIAGNOSIS — E1122 Type 2 diabetes mellitus with diabetic chronic kidney disease: Secondary | ICD-10-CM | POA: Diagnosis not present

## 2017-01-03 DIAGNOSIS — Z48812 Encounter for surgical aftercare following surgery on the circulatory system: Secondary | ICD-10-CM | POA: Diagnosis not present

## 2017-01-03 DIAGNOSIS — I2510 Atherosclerotic heart disease of native coronary artery without angina pectoris: Secondary | ICD-10-CM | POA: Diagnosis not present

## 2017-01-03 DIAGNOSIS — N183 Chronic kidney disease, stage 3 (moderate): Secondary | ICD-10-CM | POA: Diagnosis not present

## 2017-01-04 DIAGNOSIS — I2510 Atherosclerotic heart disease of native coronary artery without angina pectoris: Secondary | ICD-10-CM | POA: Diagnosis not present

## 2017-01-04 DIAGNOSIS — E1122 Type 2 diabetes mellitus with diabetic chronic kidney disease: Secondary | ICD-10-CM | POA: Diagnosis not present

## 2017-01-04 DIAGNOSIS — N183 Chronic kidney disease, stage 3 (moderate): Secondary | ICD-10-CM | POA: Diagnosis not present

## 2017-01-04 DIAGNOSIS — I129 Hypertensive chronic kidney disease with stage 1 through stage 4 chronic kidney disease, or unspecified chronic kidney disease: Secondary | ICD-10-CM | POA: Diagnosis not present

## 2017-01-04 DIAGNOSIS — M6281 Muscle weakness (generalized): Secondary | ICD-10-CM | POA: Diagnosis not present

## 2017-01-04 DIAGNOSIS — Z48812 Encounter for surgical aftercare following surgery on the circulatory system: Secondary | ICD-10-CM | POA: Diagnosis not present

## 2017-01-05 DIAGNOSIS — I129 Hypertensive chronic kidney disease with stage 1 through stage 4 chronic kidney disease, or unspecified chronic kidney disease: Secondary | ICD-10-CM | POA: Diagnosis not present

## 2017-01-05 DIAGNOSIS — I2510 Atherosclerotic heart disease of native coronary artery without angina pectoris: Secondary | ICD-10-CM | POA: Diagnosis not present

## 2017-01-05 DIAGNOSIS — Z48812 Encounter for surgical aftercare following surgery on the circulatory system: Secondary | ICD-10-CM | POA: Diagnosis not present

## 2017-01-05 DIAGNOSIS — E1122 Type 2 diabetes mellitus with diabetic chronic kidney disease: Secondary | ICD-10-CM | POA: Diagnosis not present

## 2017-01-05 DIAGNOSIS — M6281 Muscle weakness (generalized): Secondary | ICD-10-CM | POA: Diagnosis not present

## 2017-01-05 DIAGNOSIS — N183 Chronic kidney disease, stage 3 (moderate): Secondary | ICD-10-CM | POA: Diagnosis not present

## 2017-01-11 DIAGNOSIS — N183 Chronic kidney disease, stage 3 (moderate): Secondary | ICD-10-CM | POA: Diagnosis not present

## 2017-01-11 DIAGNOSIS — M6281 Muscle weakness (generalized): Secondary | ICD-10-CM | POA: Diagnosis not present

## 2017-01-11 DIAGNOSIS — Z48812 Encounter for surgical aftercare following surgery on the circulatory system: Secondary | ICD-10-CM | POA: Diagnosis not present

## 2017-01-11 DIAGNOSIS — I2510 Atherosclerotic heart disease of native coronary artery without angina pectoris: Secondary | ICD-10-CM | POA: Diagnosis not present

## 2017-01-11 DIAGNOSIS — I129 Hypertensive chronic kidney disease with stage 1 through stage 4 chronic kidney disease, or unspecified chronic kidney disease: Secondary | ICD-10-CM | POA: Diagnosis not present

## 2017-01-11 DIAGNOSIS — E1122 Type 2 diabetes mellitus with diabetic chronic kidney disease: Secondary | ICD-10-CM | POA: Diagnosis not present

## 2017-01-12 ENCOUNTER — Encounter (HOSPITAL_COMMUNITY): Payer: Self-pay

## 2017-01-12 ENCOUNTER — Encounter (HOSPITAL_COMMUNITY)
Admission: RE | Admit: 2017-01-12 | Discharge: 2017-01-12 | Disposition: A | Discharge status: Home / Self Care | Payer: Medicare Other | Source: Ambulatory Visit | Attending: Internal Medicine | Admitting: Internal Medicine

## 2017-01-12 VITALS — BP 130/72 | HR 77 | Ht 66.5 in | Wt 210.3 lb

## 2017-01-12 DIAGNOSIS — Z48812 Encounter for surgical aftercare following surgery on the circulatory system: Secondary | ICD-10-CM | POA: Insufficient documentation

## 2017-01-12 DIAGNOSIS — Z952 Presence of prosthetic heart valve: Secondary | ICD-10-CM | POA: Diagnosis not present

## 2017-01-12 DIAGNOSIS — Z953 Presence of xenogenic heart valve: Secondary | ICD-10-CM | POA: Diagnosis not present

## 2017-01-12 NOTE — Progress Notes (Signed)
Cardiac Individual Treatment Plan  Patient Details  Name: James Curtis MRN: [id number] Date of Birth: 30-Apr-1941 Referring Provider:     [place] from 01/12/2017 in [place]  Referring Provider  End, [name]  MD      Initial Encounter Date:    CARDIAC REHAB PHASE II ORIENTATION from 01/12/2017 in [place]  Date  01/12/17  Referring Provider  End, [name]  MD      Visit Diagnosis: 12/02/16 S/P AVR (aortic valve replacement)  Patient's Home Medications on Admission:  Current Outpatient Prescriptions:  .  amLODipine (NORVASC) 10 MG tablet, Take 10 mg by mouth daily., Disp: , Rfl: 11 .  Apoaequorin (PREVAGEN PO), Take 1 tablet by mouth daily. For Brain and Memory Health - OTC, Disp: , Rfl:  .  aspirin EC 325 MG tablet, Take 1 tablet (325 mg total) by mouth daily., Disp: , Rfl:  .  cholecalciferol (VITAMIN D) 1000 units tablet, Take 1,000 Units by mouth daily., Disp: , Rfl:  .  iron polysaccharides (NIFEREX) 150 MG capsule, Take 1 capsule (150 mg total) by mouth daily., Disp: , Rfl:  .  metoprolol tartrate (LOPRESSOR) 25 MG tablet, Take 1 tablet (25 mg total) by mouth 2 (two) times daily., Disp: , Rfl:  .  simvastatin (ZOCOR) 20 MG tablet, Take 20 mg by mouth at bedtime. , Disp: , Rfl:  .  Tetrahydrozoline HCl (VISINE OP), Apply to eye as directed., Disp: , Rfl:  .  valsartan (DIOVAN) 320 MG tablet, Take 320 mg by mouth daily., Disp: , Rfl:   Past Medical History: Past Medical History:  Diagnosis Date  . Aortic stenosis    a. 11/20/2016 Echo: mod AS;  b. 11/23/2016 TEE restricted mobility of noncoronary cusp;  c. 11/2016 Cath: mean grad 31mmHg w/ dual lumen catheter, AVA 2.0cm^2; mean gradient by pullback = 13mmHg, AVA 2.3cm^2.  . Carotid arterial disease ([place])    a. 11/2016 Carotid U/S: bilat 1-39% ICA stenosis.  . Chronic kidney disease   . Complete heart block ([place])    a. 11/21/2016  s/p MDT Santina Evans DR MRI SureScan (ser # [id number]).  . Dementia    mild  . Family history of adverse reaction to anesthesia    Son [name] had a difficult time waking up  . Heart murmur   . Hypercholesteremia   . Hypertension    benign  . Incidental pulmonary nodule, greater than or equal to 47mm - needs f/u scan 3 months 11/28/2016   14 x 8 mm (mean diameter of 11 mm) left lower lobe pulmonary nodule with some surrounding ground-glass attenuation (axial image 42 of series 6). 9 x 5 mm (mean diameter of 7 mm) subpleural nodule in the periphery of the right upper lobe (axial image 20 of series 6). A few patchy areas of peripheral predominant ground-glass attenuation and subpleural reticulation are noted, scattered randomly throu  . Left atrial mass   . 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 11/2009 TEE: EF 60-65%, restricted mobility of noncoronary AoV cusp, 16x4 mm mobile Ca2+ echodensity on the atrial basal surface of the posterior MV leaflet, mild MR, no LA/LAA thrombus.  . Non-obstructive CAD (coronary artery disease)    a. 11/2016 Cath: LM large, nl, LAD min irregs, LCX large, min irregs, OM3 40, RCA large, 53m,   . Pre-diabetes   . Prostate cancer [facility])    s/p radiation  . Renal  mass of unknown nature - needs f/u MRI 11/28/2016   Multiple renal lesions are noted in the kidneys bilaterally. Although several of these lesions are low-attenuation, compatible with cysts, other lesions are intermediate to high attenuation, potentially enhancing  . S/P aortic valve replacement with bioprosthetic valve 12/02/2016   23 mm Edwards Intuity rapid deployment bovine pericardial tissue valve  . S/P resection of left atrial mass 12/02/2016   Benign dystrophic fibrotic tissue with calcification attached to posterior mitral annulus by thin fibrotic stalk  . TIA (transient ischemic attack)    a. 11/2016 in setting of complete heart block.    Tobacco Use: History  Smoking Status  . Former Smoker   . Packs/day: 0.25  . Types: Cigarettes  . Quit date: 11/16/2016  Smokeless Tobacco  . Never Used    Labs: Recent Review Flowsheet Data    Labs for ITP Cardiac and Pulmonary Rehab Latest Ref Rng & Units 12/02/2016 12/02/2016 12/02/2016 12/02/2016 12/03/2016   Cholestrol 0 - 200 mg/dL - - - - -   LDLCALC 0 - 99 mg/dL - - - - -   HDL >40 mg/dL - - - - -   Trlycerides <150 mg/dL - - - - -   Hemoglobin A1c 4.8 - 5.6 % - - - - -   PHART 7.350 - 7.450 7.320(L) - 7.292(L) 7.313(L) -   PCO2ART 32.0 - 48.0 mmHg 45.0 - 44.5 46.2 -   HCO3 20.0 - 28.0 mmol/L 23.5 - 21.5 23.5 -   TCO2 0 - 100 mmol/L 25 23 23 25 24    ACIDBASEDEF 0.0 - 2.0 mmol/L 3.0(H) - 5.0(H) 3.0(H) -   O2SAT % 99.0 - 91.0 91.0 -      Capillary Blood Glucose: Lab Results  Component Value Date   GLUCAP 75 12/05/2016   GLUCAP 66 12/05/2016   GLUCAP 74 12/05/2016   GLUCAP 97 12/05/2016   GLUCAP 138 (H) 12/04/2016     Exercise Target Goals: Date: 01/12/17  Exercise Program Goal: Individual exercise prescription set with THRR, safety & activity barriers. Participant demonstrates ability to understand and report RPE using BORG scale, to self-measure pulse accurately, and to acknowledge the importance of the exercise prescription.  Exercise Prescription Goal: Starting with aerobic activity 30 plus minutes a day, 3 days per week for initial exercise prescription. Provide home exercise prescription and guidelines that participant acknowledges understanding prior to discharge.  Activity Barriers & Risk Stratification:   6 Minute Walk:     6 Minute Walk    Row Name 01/12/17 1238         6 Minute Walk   Phase Initial     Distance 1564 feet     Walk Time 6 minutes     # of Rest Breaks 0     MPH 2.96     METS 3.02     RPE 13     VO2 Peak 10.58     Symptoms Yes (comment)     Comments fatigue at end of test     Resting HR 77 bpm     Resting BP 130/72     Max Ex. HR 109 bpm     Max Ex. BP 162/68     2 Minute Post  BP 128/72        Oxygen Initial Assessment:   Oxygen Re-Evaluation:   Oxygen Discharge (Final Oxygen Re-Evaluation):   Initial Exercise Prescription:     Initial Exercise Prescription - 01/12/17 1300  Date of Initial Exercise RX and Referring Provider   Date 01/12/17   Referring Provider End, [name]  MD     Recumbant Bike   Level 2.5   Minutes 10   METs 2.3     NuStep   Level 3   SPM 70   Minutes 10   METs 2.2     Track   Laps 10   Minutes 10   METs 2.74     Prescription Details   Frequency (times per week) 3   Duration Progress to 30 minutes of continuous aerobic without signs/symptoms of physical distress     Intensity   THRR 40-80% of Max Heartrate 58-115   Ratings of Perceived Exertion 11-13   Perceived Dyspnea 0-4     Progression   Progression Continue to progress workloads to maintain intensity without signs/symptoms of physical distress.     Resistance Training   Training Prescription No   Weight 0      Perform Capillary Blood Glucose checks as needed.  Exercise Prescription Changes:   Exercise Comments:   Exercise Goals and Review:     Exercise Goals    Row Name 01/12/17 1323             Exercise Goals   Increase Physical Activity Yes       Intervention Provide advice, education, support and counseling about physical activity/exercise needs.;Develop an individualized exercise prescription for aerobic and resistive training based on initial evaluation findings, risk stratification, comorbidities and participant's personal goals.       Expected Outcomes Achievement of increased cardiorespiratory fitness and enhanced flexibility, muscular endurance and strength shown through measurements of functional capacity and personal statement of participant.       Increase Strength and Stamina Yes  Return to normal activities       Intervention Provide advice, education, support and counseling about physical activity/exercise  needs.;Develop an individualized exercise prescription for aerobic and resistive training based on initial evaluation findings, risk stratification, comorbidities and participant's personal goals.       Expected Outcomes Achievement of increased cardiorespiratory fitness and enhanced flexibility, muscular endurance and strength shown through measurements of functional capacity and personal statement of participant.          Exercise Goals Re-Evaluation :    Discharge Exercise Prescription (Final Exercise Prescription Changes):   Nutrition:  Target Goals: Understanding of nutrition guidelines, daily intake of sodium 1500mg , cholesterol 200mg , calories 30% from fat and 7% or less from saturated fats, daily to have 5 or more servings of fruits and vegetables.  Biometrics:     Pre Biometrics - 01/12/17 1242      Pre Biometrics   Waist Circumference 42 inches   Hip Circumference 44 inches   Waist to Hip Ratio 0.95 %   Triceps Skinfold 21 mm   % Body Fat 32.5 %   Grip Strength 32 kg   Flexibility 10 in   Single Leg Stand 4.31 seconds       Nutrition Therapy Plan and Nutrition Goals:   Nutrition Discharge: Nutrition Scores:   Nutrition Goals Re-Evaluation:   Nutrition Goals Re-Evaluation:   Nutrition Goals Discharge (Final Nutrition Goals Re-Evaluation):   Psychosocial: Target Goals: Acknowledge presence or absence of significant depression and/or stress, maximize coping skills, provide positive support system. Participant is able to verbalize types and ability to use techniques and skills needed for reducing stress and depression.  Initial Review & Psychosocial Screening:     Initial Psych Review &  Screening - 01/12/17 1539      Initial Review   Current issues with None Identified     Family Dynamics   Good Support System? Yes  Pt is accompanied by his son, [name] for this appt.     Barriers   Psychosocial barriers to participate in program There are no  identifiable barriers or psychosocial needs.      Quality of Life Scores:   PHQ-9: Recent Review Flowsheet Data    There is no flowsheet data to display.     Interpretation of Total Score  Total Score Depression Severity:  1-4 = Minimal depression, 5-9 = Mild depression, 10-14 = Moderate depression, 15-19 = Moderately severe depression, 20-27 = Severe depression   Psychosocial Evaluation and Intervention:   Psychosocial Re-Evaluation:   Psychosocial Discharge (Final Psychosocial Re-Evaluation):   Vocational Rehabilitation: Provide vocational rehab assistance to qualifying candidates.   Vocational Rehab Evaluation & Intervention:   Education: Education Goals: Education classes will be provided on a weekly basis, covering required topics. Participant will state understanding/return demonstration of topics presented.  Learning Barriers/Preferences:     Learning Barriers/Preferences - 01/12/17 1237      Learning Barriers/Preferences   Learning Barriers Sight  memory deficit   Learning Preferences Verbal Instruction;Skilled Demonstration;Written Material      Education Topics: Count Your Pulse:  -Group instruction provided by verbal instruction, demonstration, patient participation and written materials to support subject.  Instructors address importance of being able to find your pulse and how to count your pulse when at home without a heart monitor.  Patients get hands on experience counting their pulse with staff help and individually.   Heart Attack, Angina, and Risk Factor Modification:  -Group instruction provided by verbal instruction, video, and written materials to support subject.  Instructors address signs and symptoms of angina and heart attacks.    Also discuss risk factors for heart disease and how to make changes to improve heart health risk factors.   Functional Fitness:  -Group instruction provided by verbal instruction, demonstration, patient  participation, and written materials to support subject.  Instructors address safety measures for doing things around the house.  Discuss how to get up and down off the floor, how to pick things up properly, how to safely get out of a chair without assistance, and balance training.   Meditation and Mindfulness:  -Group instruction provided by verbal instruction, patient participation, and written materials to support subject.  Instructor addresses importance of mindfulness and meditation practice to help reduce stress and improve awareness.  Instructor also leads participants through a meditation exercise.    Stretching for Flexibility and Mobility:  -Group instruction provided by verbal instruction, patient participation, and written materials to support subject.  Instructors lead participants through series of stretches that are designed to increase flexibility thus improving mobility.  These stretches are additional exercise for major muscle groups that are typically performed during regular warm up and cool down.   Hands Only CPR:  -Group verbal, video, and participation provides a basic overview of AHA guidelines for community CPR. Role-play of emergencies allow participants the opportunity to practice calling for help and chest compression technique with discussion of AED use.   Hypertension: -Group verbal and written instruction that provides a basic overview of hypertension including the most recent diagnostic guidelines, risk factor reduction with self-care instructions and medication management.    Nutrition I class: Heart Healthy Eating:  -Group instruction provided by PowerPoint slides, verbal discussion, and written materials to  support subject matter. The instructor gives an explanation and review of the Therapeutic Lifestyle Changes diet recommendations, which includes a discussion on lipid goals, dietary fat, sodium, fiber, plant stanol/sterol esters, sugar, and the components of  a well-balanced, healthy diet.   Nutrition II class: Lifestyle Skills:  -Group instruction provided by PowerPoint slides, verbal discussion, and written materials to support subject matter. The instructor gives an explanation and review of label reading, grocery shopping for heart health, heart healthy recipe modifications, and ways to make healthier choices when eating out.   Diabetes Question & Answer:  -Group instruction provided by PowerPoint slides, verbal discussion, and written materials to support subject matter. The instructor gives an explanation and review of diabetes co-morbidities, pre- and post-prandial blood glucose goals, pre-exercise blood glucose goals, signs, symptoms, and treatment of hypoglycemia and hyperglycemia, and foot care basics.   Diabetes Blitz:  -Group instruction provided by PowerPoint slides, verbal discussion, and written materials to support subject matter. The instructor gives an explanation and review of the physiology behind type 1 and type 2 diabetes, diabetes medications and rational behind using different medications, pre- and post-prandial blood glucose recommendations and Hemoglobin A1c goals, diabetes diet, and exercise including blood glucose guidelines for exercising safely.    Portion Distortion:  -Group instruction provided by PowerPoint slides, verbal discussion, written materials, and food models to support subject matter. The instructor gives an explanation of serving size versus portion size, changes in portions sizes over the last 20 years, and what consists of a serving from each food group.   Stress Management:  -Group instruction provided by verbal instruction, video, and written materials to support subject matter.  Instructors review role of stress in heart disease and how to cope with stress positively.     Exercising on Your Own:  -Group instruction provided by verbal instruction, power point, and written materials to support  subject.  Instructors discuss benefits of exercise, components of exercise, frequency and intensity of exercise, and end points for exercise.  Also discuss use of nitroglycerin and activating EMS.  Review options of places to exercise outside of rehab.  Review guidelines for sex with heart disease.   Cardiac Drugs I:  -Group instruction provided by verbal instruction and written materials to support subject.  Instructor reviews cardiac drug classes: antiplatelets, anticoagulants, beta blockers, and statins.  Instructor discusses reasons, side effects, and lifestyle considerations for each drug class.   Cardiac Drugs II:  -Group instruction provided by verbal instruction and written materials to support subject.  Instructor reviews cardiac drug classes: angiotensin converting enzyme inhibitors (ACE-I), angiotensin II receptor blockers (ARBs), nitrates, and calcium channel blockers.  Instructor discusses reasons, side effects, and lifestyle considerations for each drug class.   Anatomy and Physiology of the Circulatory System:  Group verbal and written instruction and models provide basic cardiac anatomy and physiology, with the coronary electrical and arterial systems. Review of: AMI, Angina, Valve disease, Heart Failure, Peripheral Artery Disease, Cardiac Arrhythmia, Pacemakers, and the ICD.   Other Education:  -Group or individual verbal, written, or video instructions that support the educational goals of the cardiac rehab program.   Knowledge Questionnaire Score:   Core Components/Risk Factors/Patient Goals at Admission:     Personal Goals and Risk Factors at Admission - 01/12/17 1015      Core Components/Risk Factors/Patient Goals on Admission   Diabetes Yes   Intervention Provide education about signs/symptoms and action to take for hypo/hyperglycemia.;Provide education about proper nutrition, including hydration, and aerobic/resistive exercise prescription  along with prescribed  medications to achieve blood glucose in normal ranges: Fasting glucose 65-99 mg/dL   Expected Outcomes Short Term: Participant verbalizes understanding of the signs/symptoms and immediate care of hyper/hypoglycemia, proper foot care and importance of medication, aerobic/resistive exercise and nutrition plan for blood glucose control.;Long Term: Attainment of HbA1C < 7%.      Core Components/Risk Factors/Patient Goals Review:    Core Components/Risk Factors/Patient Goals at Discharge (Final Review):    ITP Comments:     ITP Comments    Row Name 01/12/17 1231           ITP Comments Medical Director, Dr. Fransico Him          Comments: Patient attended orientation from 0800 to 1030 to review rules and guidelines for program. Completed 6 minute walk test, Intitial ITP, and exercise prescription.  VSS. Telemetry-100% V paced.  Asymptomatic. Brief Psychosocial Assessment Pt is accompanied by his son [name] who heavily assist pt with cognitive task due to mild dementia.  Pt with no immediate barriers to participating in cardiac rehab. Pt to bring pre assessment tools on his return appt.  Pt is looking forward to participating in cardiac rehab. Cherre Huger, BSN Cardiac and Training and development officer

## 2017-01-12 NOTE — Progress Notes (Signed)
Cardiac Rehab Medication Review by a Pharmacist  Does the patient  feel that his/her medications are working for him/her?  yes  Has the patient been experiencing any side effects to the medications prescribed?  no  Does the patient measure his/her own blood pressure or blood glucose at home?  no   Does the patient have any problems obtaining medications due to transportation or finances?   no  Understanding of regimen: poor Understanding of indications: poor Potential of compliance: good    Pharmacist comments: Pt presents for initial cardiac rehab appointment accompanied by son who is his caretaker. No active issues identified.    Arrie Senate, PharmD PGY-1 Pharmacy Resident Pager: [phone] 01/12/2017

## 2017-01-16 ENCOUNTER — Encounter (HOSPITAL_COMMUNITY)
Admission: RE | Admit: 2017-01-16 | Discharge: 2017-01-16 | Disposition: A | Discharge status: Home / Self Care | Payer: Medicare Other | Source: Ambulatory Visit | Attending: Internal Medicine | Admitting: Internal Medicine

## 2017-01-16 ENCOUNTER — Other Ambulatory Visit: Payer: Self-pay | Admitting: Adult Health

## 2017-01-16 DIAGNOSIS — Z953 Presence of xenogenic heart valve: Secondary | ICD-10-CM | POA: Insufficient documentation

## 2017-01-16 DIAGNOSIS — Z48812 Encounter for surgical aftercare following surgery on the circulatory system: Secondary | ICD-10-CM | POA: Insufficient documentation

## 2017-01-16 DIAGNOSIS — Z952 Presence of prosthetic heart valve: Secondary | ICD-10-CM | POA: Diagnosis not present

## 2017-01-16 LAB — GLUCOSE, CAPILLARY: GLUCOSE-CAPILLARY: 156 mg/dL — AB (ref 65–99)

## 2017-01-16 NOTE — Progress Notes (Signed)
Daily Session Note  Patient Details  Name: James Curtis MRN: [id number] Date of Birth: Mar 11, 1941 Referring Provider:     [facility] from 01/12/2017 in [place]  Referring Provider  End, [name]  MD      Encounter Date: 01/16/2017  Check In:     Session Check In - 01/16/17 1215      Check-In   Location [facility]   Staff Present Cleda Mccreedy, MS, Exercise Physiologist;[place] Wilber Oliphant, RN, BSN;Amber Fair, MS, ACSM RCEP, Exercise Physiologist;Maria Whitaker, RN, Deland Pretty, MS, ACSM CEP, Exercise Physiologist   Supervising physician immediately available to respond to emergencies [facility] immediately available   Physician(s) Dr. Broadus John   Medication changes reported     No   Fall or balance concerns reported    No   Tobacco Cessation No Change   Warm-up and Cool-down Performed as group-led instruction   Resistance Training Performed Yes   VAD Patient? No     Pain Assessment   Currently in Pain? No/denies      Capillary Blood Glucose: No results found for this or any previous visit (from the past 24 hour(s)).    History  Smoking Status  . Former Smoker  . Packs/day: 0.25  . Types: Cigarettes  . Quit date: 11/16/2016  Smokeless Tobacco  . Never Used    Goals Met:  Exercise tolerated well Personal goals reviewed No report of cardiac concerns or symptoms  Goals Unmet:  Not Applicable  Comments:  Pt started full exercise cardiac rehab today.  Pt tolerated light exercise without difficulty. VSS, telemetry-V paced at 100%, asymptomatic.  Medication list reconciled with pt son.  Pt with mild dementia. Pt denies barriers to medication compliance.  PSYCHOSOCIAL ASSESSMENT:  PHQ-1. Pt completed pre assessment quality of life survey.  Pt scored the following      Quality of Life - 01/16/17 1113      Quality of Life Scores   Health/Function Pre 7.97 %   Socioeconomic  Pre 11.21 %   Psych/Spiritual Pre 12.79 %   Family Pre 13.2 %   GLOBAL Pre 10.44 %    Pt unsure he would like to talk to someone in the spiritual care department. Pt wants to talk to his son.  Pt list his family as his support system.  Pt son brought him to his rehab appointment and picked him up afterward.  Pt enjoys hanging out with family. Pt desires get back to normal activities and be able to drive.  Pt oriented and reminders provided to exercise equipment and routine.  Understanding verbalized. Maurice Small RN, BSN Cardiac and Pulmonary Rehab Nurse Navigator      Dr. Fransico Him is Medical Director for Cardiac Rehab at [facility].

## 2017-01-18 ENCOUNTER — Encounter (HOSPITAL_COMMUNITY): Payer: Medicare Other

## 2017-01-20 ENCOUNTER — Encounter (HOSPITAL_COMMUNITY)
Admission: RE | Admit: 2017-01-20 | Discharge: 2017-01-20 | Disposition: A | Discharge status: Home / Self Care | Payer: Medicare Other | Source: Ambulatory Visit | Attending: Internal Medicine | Admitting: Internal Medicine

## 2017-01-20 DIAGNOSIS — Z953 Presence of xenogenic heart valve: Secondary | ICD-10-CM | POA: Diagnosis not present

## 2017-01-20 DIAGNOSIS — Z48812 Encounter for surgical aftercare following surgery on the circulatory system: Secondary | ICD-10-CM | POA: Diagnosis not present

## 2017-01-20 DIAGNOSIS — Z952 Presence of prosthetic heart valve: Secondary | ICD-10-CM | POA: Diagnosis not present

## 2017-01-20 LAB — GLUCOSE, CAPILLARY
GLUCOSE-CAPILLARY: 89 mg/dL (ref 65–99)
GLUCOSE-CAPILLARY: 128 mg/dL — AB (ref 65–99)

## 2017-01-23 ENCOUNTER — Encounter (HOSPITAL_COMMUNITY)
Admission: RE | Admit: 2017-01-23 | Discharge: 2017-01-23 | Disposition: A | Discharge status: Home / Self Care | Payer: Medicare Other | Source: Ambulatory Visit | Attending: Internal Medicine | Admitting: Internal Medicine

## 2017-01-23 DIAGNOSIS — Z952 Presence of prosthetic heart valve: Secondary | ICD-10-CM | POA: Diagnosis not present

## 2017-01-23 DIAGNOSIS — Z48812 Encounter for surgical aftercare following surgery on the circulatory system: Secondary | ICD-10-CM | POA: Diagnosis not present

## 2017-01-23 DIAGNOSIS — Z953 Presence of xenogenic heart valve: Secondary | ICD-10-CM | POA: Diagnosis not present

## 2017-01-24 ENCOUNTER — Other Ambulatory Visit: Payer: Self-pay | Admitting: Internal Medicine

## 2017-01-24 ENCOUNTER — Other Ambulatory Visit (HOSPITAL_COMMUNITY): Payer: Self-pay | Admitting: Internal Medicine

## 2017-01-24 DIAGNOSIS — N183 Chronic kidney disease, stage 3 (moderate): Secondary | ICD-10-CM | POA: Diagnosis not present

## 2017-01-24 DIAGNOSIS — Z950 Presence of cardiac pacemaker: Secondary | ICD-10-CM | POA: Diagnosis not present

## 2017-01-24 DIAGNOSIS — Z952 Presence of prosthetic heart valve: Secondary | ICD-10-CM | POA: Diagnosis not present

## 2017-01-24 DIAGNOSIS — G459 Transient cerebral ischemic attack, unspecified: Secondary | ICD-10-CM | POA: Diagnosis not present

## 2017-01-24 DIAGNOSIS — E1122 Type 2 diabetes mellitus with diabetic chronic kidney disease: Secondary | ICD-10-CM | POA: Diagnosis not present

## 2017-01-24 DIAGNOSIS — D649 Anemia, unspecified: Secondary | ICD-10-CM | POA: Diagnosis not present

## 2017-01-24 DIAGNOSIS — N2889 Other specified disorders of kidney and ureter: Secondary | ICD-10-CM

## 2017-01-24 DIAGNOSIS — E1165 Type 2 diabetes mellitus with hyperglycemia: Secondary | ICD-10-CM | POA: Diagnosis not present

## 2017-01-24 DIAGNOSIS — F0390 Unspecified dementia without behavioral disturbance: Secondary | ICD-10-CM | POA: Diagnosis not present

## 2017-01-25 ENCOUNTER — Encounter (HOSPITAL_COMMUNITY)
Admission: RE | Admit: 2017-01-25 | Discharge: 2017-01-25 | Disposition: A | Discharge status: Home / Self Care | Payer: Medicare Other | Source: Ambulatory Visit | Attending: Internal Medicine | Admitting: Internal Medicine

## 2017-01-25 DIAGNOSIS — Z952 Presence of prosthetic heart valve: Secondary | ICD-10-CM | POA: Diagnosis not present

## 2017-01-25 DIAGNOSIS — Z953 Presence of xenogenic heart valve: Secondary | ICD-10-CM | POA: Diagnosis not present

## 2017-01-25 DIAGNOSIS — Z48812 Encounter for surgical aftercare following surgery on the circulatory system: Secondary | ICD-10-CM | POA: Diagnosis not present

## 2017-01-26 ENCOUNTER — Ambulatory Visit (HOSPITAL_COMMUNITY): Payer: Medicare Other | Attending: Physician Assistant

## 2017-01-26 DIAGNOSIS — I10 Essential (primary) hypertension: Secondary | ICD-10-CM | POA: Insufficient documentation

## 2017-01-26 DIAGNOSIS — E785 Hyperlipidemia, unspecified: Secondary | ICD-10-CM | POA: Insufficient documentation

## 2017-01-26 DIAGNOSIS — I371 Nonrheumatic pulmonary valve insufficiency: Secondary | ICD-10-CM | POA: Insufficient documentation

## 2017-01-26 DIAGNOSIS — F0390 Unspecified dementia without behavioral disturbance: Secondary | ICD-10-CM | POA: Insufficient documentation

## 2017-01-26 DIAGNOSIS — I2510 Atherosclerotic heart disease of native coronary artery without angina pectoris: Secondary | ICD-10-CM | POA: Insufficient documentation

## 2017-01-26 DIAGNOSIS — Z953 Presence of xenogenic heart valve: Secondary | ICD-10-CM | POA: Diagnosis not present

## 2017-01-26 DIAGNOSIS — N2889 Other specified disorders of kidney and ureter: Secondary | ICD-10-CM | POA: Insufficient documentation

## 2017-01-26 DIAGNOSIS — R911 Solitary pulmonary nodule: Secondary | ICD-10-CM | POA: Insufficient documentation

## 2017-01-26 DIAGNOSIS — E119 Type 2 diabetes mellitus without complications: Secondary | ICD-10-CM | POA: Diagnosis not present

## 2017-01-26 DIAGNOSIS — I083 Combined rheumatic disorders of mitral, aortic and tricuspid valves: Secondary | ICD-10-CM | POA: Insufficient documentation

## 2017-01-26 DIAGNOSIS — Z950 Presence of cardiac pacemaker: Secondary | ICD-10-CM

## 2017-01-26 DIAGNOSIS — Z720 Tobacco use: Secondary | ICD-10-CM | POA: Diagnosis not present

## 2017-01-27 ENCOUNTER — Encounter (HOSPITAL_COMMUNITY)
Admission: RE | Admit: 2017-01-27 | Discharge: 2017-01-27 | Disposition: A | Discharge status: Home / Self Care | Payer: Medicare Other | Source: Ambulatory Visit | Attending: Internal Medicine | Admitting: Internal Medicine

## 2017-01-27 ENCOUNTER — Encounter: Payer: Self-pay | Admitting: Physician Assistant

## 2017-01-27 DIAGNOSIS — Z953 Presence of xenogenic heart valve: Secondary | ICD-10-CM | POA: Diagnosis not present

## 2017-01-27 DIAGNOSIS — I428 Other cardiomyopathies: Secondary | ICD-10-CM | POA: Insufficient documentation

## 2017-01-27 DIAGNOSIS — Z952 Presence of prosthetic heart valve: Secondary | ICD-10-CM | POA: Diagnosis not present

## 2017-01-27 DIAGNOSIS — Z48812 Encounter for surgical aftercare following surgery on the circulatory system: Secondary | ICD-10-CM | POA: Diagnosis not present

## 2017-01-30 ENCOUNTER — Encounter (HOSPITAL_COMMUNITY)
Admission: RE | Admit: 2017-01-30 | Discharge: 2017-01-30 | Disposition: A | Discharge status: Home / Self Care | Payer: Medicare Other | Source: Ambulatory Visit | Attending: Internal Medicine | Admitting: Internal Medicine

## 2017-01-30 DIAGNOSIS — Z952 Presence of prosthetic heart valve: Secondary | ICD-10-CM

## 2017-01-30 DIAGNOSIS — Z48812 Encounter for surgical aftercare following surgery on the circulatory system: Secondary | ICD-10-CM | POA: Diagnosis not present

## 2017-01-30 DIAGNOSIS — Z953 Presence of xenogenic heart valve: Secondary | ICD-10-CM | POA: Diagnosis not present

## 2017-01-31 NOTE — Progress Notes (Signed)
Cardiac Individual Treatment Plan  Patient Details  Name: James Curtis MRN: [id number] Date of Birth: Feb 12, 1941 Referring Provider:     [place] from 01/12/2017 in [place]  Referring Provider  End, [name]  MD      Initial Encounter Date:    CARDIAC REHAB PHASE II ORIENTATION from 01/12/2017 in [facility]  Date  01/12/17  Referring Provider  End, [name]  MD      Visit Diagnosis: 12/02/16 S/P AVR (aortic valve replacement)  Patient's Home Medications on Admission:  Current Outpatient Prescriptions:  .  amLODipine (NORVASC) 10 MG tablet, Take 10 mg by mouth daily., Disp: , Rfl: 11 .  Apoaequorin (PREVAGEN PO), Take 1 tablet by mouth daily. For Brain and Memory Health - OTC, Disp: , Rfl:  .  aspirin EC 325 MG tablet, Take 1 tablet (325 mg total) by mouth daily., Disp: , Rfl:  .  cholecalciferol (VITAMIN D) 1000 units tablet, Take 1,000 Units by mouth daily., Disp: , Rfl:  .  iron polysaccharides (NIFEREX) 150 MG capsule, Take 1 capsule (150 mg total) by mouth daily., Disp: , Rfl:  .  metoprolol tartrate (LOPRESSOR) 25 MG tablet, Take 1 tablet (25 mg total) by mouth 2 (two) times daily., Disp: , Rfl:  .  simvastatin (ZOCOR) 20 MG tablet, Take 20 mg by mouth at bedtime. , Disp: , Rfl:  .  Tetrahydrozoline HCl (VISINE OP), Apply to eye as directed., Disp: , Rfl:  .  valsartan (DIOVAN) 320 MG tablet, Take 320 mg by mouth daily., Disp: , Rfl:   Past Medical History: Past Medical History:  Diagnosis Date  . Aortic stenosis    a. 11/20/2016 Echo: mod AS;  b. 11/23/2016 TEE restricted mobility of noncoronary cusp;  c. 11/2016 Cath: mean grad 44mHg w/ dual lumen catheter, AVA 2.0cm^2; mean gradient by pullback = 151mg, AVA 2.3cm^2.  . [place]omy[place] ([facility]   Echo 6/18: EF 40-45, inf/inf-septal HK-AK, Gr 1 DD, severe LVH, MAC, severe LAE, mildly reduced RVSF  . Carotid arterial disease ([facility]    a. 11/2016 Carotid U/S: bilat 1-39% ICA stenosis.  . Chronic kidney disease   . Complete heart block ([facility]   a. 11/21/2016 s/p MDT AzSantina EvansR MRI SureScan (ser # [id number]).  . Dementia    mild  . Family history of adverse reaction to anesthesia    Son [name] a difficult time waking up  . Heart murmur   . Hypercholesteremia   . Hypertension    benign  . Incidental pulmonary nodule, greater than or equal to 73m64m needs f/u scan 3 months 11/28/2016   14 x 8 mm (mean diameter of 11 mm) left lower lobe pulmonary nodule with some surrounding ground-glass attenuation (axial image 42 of series 6). 9 x 5 mm (mean diameter of 7 mm) subpleural nodule in the periphery of the right upper lobe (axial image 20 of series 6). A few patchy areas of peripheral predominant ground-glass attenuation and subpleural reticulation are noted, scattered randomly throu  . Left atrial mass   . 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 11/2009 TEE: EF 60-65%, restricted mobility of noncoronary AoV cusp, 16x4 mm mobile Ca2+ echodensity on the atrial basal surface of the posterior MV leaflet, mild MR, no LA/LAA thrombus.  . Non-obstructive CAD (coronary artery disease)    a. 11/2016 Cath: LM large, nl, LAD min irregs, LCX large, min  irregs, OM3 40, RCA large, 86m   . Pre-diabetes   . Prostate cancer ([facility]    s/p radiation  . Renal mass of unknown nature - needs f/u MRI 11/28/2016   Multiple renal lesions are noted in the kidneys bilaterally. Although several of these lesions are low-attenuation, compatible with cysts, other lesions are intermediate to high attenuation, potentially enhancing  . S/P aortic valve replacement with bioprosthetic valve 12/02/2016   23 mm Edwards Intuity rapid deployment bovine pericardial tissue valve  . S/P resection of left atrial mass 12/02/2016   Benign dystrophic fibrotic tissue with calcification attached to posterior mitral annulus by thin fibrotic stalk  . TIA (transient  ischemic attack)    a. 11/2016 in setting of complete heart block.    Tobacco Use: History  Smoking Status  . Former Smoker  . Packs/day: 0.25  . Types: Cigarettes  . Quit date: 11/16/2016  Smokeless Tobacco  . Never Used    Labs: Recent Review Flowsheet Data    Labs for ITP Cardiac and Pulmonary Rehab Latest Ref Rng & Units 12/02/2016 12/02/2016 12/02/2016 12/02/2016 12/03/2016   Cholestrol 0 - 200 mg/dL - - - - -   LDLCALC 0 - 99 mg/dL - - - - -   HDL >40 mg/dL - - - - -   Trlycerides <150 mg/dL - - - - -   Hemoglobin A1c 4.8 - 5.6 % - - - - -   PHART 7.350 - 7.450 7.320(L) - 7.292(L) 7.313(L) -   PCO2ART 32.0 - 48.0 mmHg 45.0 - 44.5 46.2 -   HCO3 20.0 - 28.0 mmol/L 23.5 - 21.5 23.5 -   TCO2 0 - 100 mmol/L _0 ACIDBASEDEF 0.0 - 2.0 mmol/L 3.0(H) - 5.0(H) 3.0(H) -   O2SAT % 99.0 - 91.0 91.0 -      Capillary Blood Glucose: Lab Results  Component Value Date   GLUCAP 128 (H) 01/20/2017   GLUCAP 89 01/20/2017   GLUCAP 156 (H) 01/16/2017   GLUCAP 75 12/05/2016   GLUCAP 66 12/05/2016     Exercise Target Goals:    Exercise Program Goal: Individual exercise prescription set with THRR, safety & activity barriers. Participant demonstrates ability to understand and report RPE using BORG scale, to self-measure pulse accurately, and to acknowledge the importance of the exercise prescription.  Exercise Prescription Goal: Starting with aerobic activity 30 plus minutes a day, 3 days per week for initial exercise prescription. Provide home exercise prescription and guidelines that participant acknowledges understanding prior to discharge.  Activity Barriers & Risk Stratification:   6 Minute Walk:     6 Minute Walk    Row Name 01/12/17 1238         6 Minute Walk   Phase Initial     Distance 1564 feet     Walk Time 6 minutes     # of Rest Breaks 0     MPH 2.96     METS 3.02     RPE 13     VO2 Peak 10.58     Symptoms Yes (comment)     Comments fatigue at end  of test     Resting HR 77 bpm     Resting BP 130/72     Max Ex. HR 109 bpm     Max Ex. BP 162/68     2 Minute Post BP 128/72        Oxygen Initial Assessment:   Oxygen Re-Evaluation:  Oxygen Discharge (Final Oxygen Re-Evaluation):   Initial Exercise Prescription:     Initial Exercise Prescription - 01/12/17 1300      Date of Initial Exercise RX and Referring Provider   Date 01/12/17   Referring Provider End, [name]  MD     Recumbant Bike   Level 2.5   Minutes 10   METs 2.3     NuStep   Level 3   SPM 70   Minutes 10   METs 2.2     Track   Laps 10   Minutes 10   METs 2.74     Prescription Details   Frequency (times per week) 3   Duration Progress to 30 minutes of continuous aerobic without signs/symptoms of physical distress     Intensity   THRR 40-80% of Max Heartrate 58-115   Ratings of Perceived Exertion 11-13   Perceived Dyspnea 0-4     Progression   Progression Continue to progress workloads to maintain intensity without signs/symptoms of physical distress.     Resistance Training   Training Prescription No   Weight 0      Perform Capillary Blood Glucose checks as needed.  Exercise Prescription Changes:      Exercise Prescription Changes    Row Name 01/19/17 1200             Response to Exercise   Blood Pressure (Admit) 120/72       Blood Pressure (Exercise) 122/70       Blood Pressure (Exit) 112/62       Heart Rate (Admit) 64 bpm       Heart Rate (Exercise) 87 bpm       Heart Rate (Exit) 64 bpm       Rating of Perceived Exertion (Exercise) 12       Symptoms none       Comments pt was oriented to exercise equipment       Duration Continue with 30 min of aerobic exercise without signs/symptoms of physical distress.       Intensity THRR unchanged         Progression   Progression Continue to progress workloads to maintain intensity without signs/symptoms of physical distress.       Average METs 2         Resistance  Training   Training Prescription No       Weight 0         Recumbant Bike   Level 2       Minutes 10       METs 1.6         NuStep   Level 3       SPM 70       Minutes 10       METs 1.7         Track   Laps 10       Minutes 10       METs 2.74          Exercise Comments:   Exercise Goals and Review:      Exercise Goals    Row Name 01/12/17 1323             Exercise Goals   Increase Physical Activity Yes       Intervention Provide advice, education, support and counseling about physical activity/exercise needs.;Develop an individualized exercise prescription for aerobic and resistive training based on initial evaluation findings, risk stratification, comorbidities and participant's personal goals.  Expected Outcomes Achievement of increased cardiorespiratory fitness and enhanced flexibility, muscular endurance and strength shown through measurements of functional capacity and personal statement of participant.       Increase Strength and Stamina Yes  Return to normal activities       Intervention Provide advice, education, support and counseling about physical activity/exercise needs.;Develop an individualized exercise prescription for aerobic and resistive training based on initial evaluation findings, risk stratification, comorbidities and participant's personal goals.       Expected Outcomes Achievement of increased cardiorespiratory fitness and enhanced flexibility, muscular endurance and strength shown through measurements of functional capacity and personal statement of participant.          Exercise Goals Re-Evaluation :     Exercise Goals Re-Evaluation    Row Name 01/30/17 1650 01/30/17 1651 02/01/17 1212         Exercise Goal Re-Evaluation   Exercise Goals Review Increase Physical Activity;Increase Strenth and Stamina  - Increase Strenth and Stamina;Increase Physical Activity     Comments  - Pt is tolerating exercise really well and is able to  exercise for 30 minutes without difficulty. Reviewed home exercise with pt today.  Pt plans to walk at local shopping center or with son around neighborhood for exercise, 2x/week in addition to coming to cardiac rehab. Reviewed exercise plan with son for understanding. Reviewed THR, pulse, RPE, sign and symptoms, and when to call 911 or MD.  Also discussed weather considerations and indoor options.  Pt and son voiced understanding.     Expected Outcomes  - Pt will continue to improve in cardiorespiratory fitness Pt will be compliant with home exercise and improve in cardiorespiratory fitness.         Discharge Exercise Prescription (Final Exercise Prescription Changes):     Exercise Prescription Changes - 01/19/17 1200      Response to Exercise   Blood Pressure (Admit) 120/72   Blood Pressure (Exercise) 122/70   Blood Pressure (Exit) 112/62   Heart Rate (Admit) 64 bpm   Heart Rate (Exercise) 87 bpm   Heart Rate (Exit) 64 bpm   Rating of Perceived Exertion (Exercise) 12   Symptoms none   Comments pt was oriented to exercise equipment   Duration Continue with 30 min of aerobic exercise without signs/symptoms of physical distress.   Intensity THRR unchanged     Progression   Progression Continue to progress workloads to maintain intensity without signs/symptoms of physical distress.   Average METs 2     Resistance Training   Training Prescription No   Weight 0     Recumbant Bike   Level 2   Minutes 10   METs 1.6     NuStep   Level 3   SPM 70   Minutes 10   METs 1.7     Track   Laps 10   Minutes 10   METs 2.74      Nutrition:  Target Goals: Understanding of nutrition guidelines, daily intake of sodium <1568m, cholesterol <2060m calories 30% from fat and 7% or less from saturated fats, daily to have 5 or more servings of fruits and vegetables.  Biometrics:     Pre Biometrics - 01/12/17 1242      Pre Biometrics   Waist Circumference 42 inches   Hip  Circumference 44 inches   Waist to Hip Ratio 0.95 %   Triceps Skinfold 21 mm   % Body Fat 32.5 %   Grip Strength 32 kg   Flexibility  10 in   Single Leg Stand 4.31 seconds       Nutrition Therapy Plan and Nutrition Goals:   Nutrition Discharge: Nutrition Scores:   Nutrition Goals Re-Evaluation:   Nutrition Goals Re-Evaluation:   Nutrition Goals Discharge (Final Nutrition Goals Re-Evaluation):   Psychosocial: Target Goals: Acknowledge presence or absence of significant depression and/or stress, maximize coping skills, provide positive support system. Participant is able to verbalize types and ability to use techniques and skills needed for reducing stress and depression.  Initial Review & Psychosocial Screening:     Initial Psych Review & Screening - 01/12/17 1539      Initial Review   Current issues with None Identified     Family Dynamics   Good Support System? Yes  Pt is accompanied by his son, [name] for this appt.     Barriers   Psychosocial barriers to participate in program There are no identifiable barriers or psychosocial needs.      Quality of Life Scores:     Quality of Life - 01/16/17 1113      Quality of Life Scores   Health/Function Pre 7.97 %   Socioeconomic Pre 11.21 %   Psych/Spiritual Pre 12.79 %   Family Pre 13.2 %   GLOBAL Pre 10.44 %      PHQ-9: Recent Review Flowsheet Data    Depression screen PHQ 2/9 01/16/2017   Decreased Interest 1   Down, Depressed, Hopeless 0   PHQ - 2 Score 1     Interpretation of Total Score  Total Score Depression Severity:  1-4 = Minimal depression, 5-9 = Mild depression, 10-14 = Moderate depression, 15-19 = Moderately severe depression, 20-27 = Severe depression   Psychosocial Evaluation and Intervention:     Psychosocial Evaluation - 01/31/17 0002      Psychosocial Evaluation & Interventions   Interventions Encouraged to exercise with the program and follow exercise prescription;Stress  management education   Continue Psychosocial Services  Follow up required by staff      Psychosocial Re-Evaluation:     Psychosocial Re-Evaluation    [place] Name 01/31/17 0002             Psychosocial Re-Evaluation   Comments Pt son cares for pt due to dementia.       Interventions Stress management education;Relaxation education;Encouraged to attend Cardiac Rehabilitation for the exercise       Continue Psychosocial Services  Follow up required by staff          Psychosocial Discharge (Final Psychosocial Re-Evaluation):     Psychosocial Re-Evaluation - 01/31/17 0002      Psychosocial Re-Evaluation   Comments Pt son cares for pt due to dementia.   Interventions Stress management education;Relaxation education;Encouraged to attend Cardiac Rehabilitation for the exercise   Continue Psychosocial Services  Follow up required by staff      Vocational Rehabilitation: Provide vocational rehab assistance to qualifying candidates.   Vocational Rehab Evaluation & Intervention:   Education: Education Goals: Education classes will be provided on a weekly basis, covering required topics. Participant will state understanding/return demonstration of topics presented.  Learning Barriers/Preferences:     Learning Barriers/Preferences - 01/12/17 1237      Learning Barriers/Preferences   Learning Barriers Sight  memory deficit   Learning Preferences Verbal Instruction;Skilled Demonstration;Written Material      Education Topics: Count Your Pulse:  -Group instruction provided by verbal instruction, demonstration, patient participation and written materials to support subject.  Instructors address importance of being able  to find your pulse and how to count your pulse when at home without a heart monitor.  Patients get hands on experience counting their pulse with staff help and individually.   Heart Attack, Angina, and Risk Factor Modification:  -Group instruction provided by  verbal instruction, video, and written materials to support subject.  Instructors address signs and symptoms of angina and heart attacks.    Also discuss risk factors for heart disease and how to make changes to improve heart health risk factors.   Functional Fitness:  -Group instruction provided by verbal instruction, demonstration, patient participation, and written materials to support subject.  Instructors address safety measures for doing things around the house.  Discuss how to get up and down off the floor, how to pick things up properly, how to safely get out of a chair without assistance, and balance training.   CARDIAC REHAB PHASE II EXERCISE from 01/27/2017 in [place]  Date  01/27/17  Instruction Review Code  1- partially meets, needs review/practice      Meditation and Mindfulness:  -Group instruction provided by verbal instruction, patient participation, and written materials to support subject.  Instructor addresses importance of mindfulness and meditation practice to help reduce stress and improve awareness.  Instructor also leads participants through a meditation exercise.    Stretching for Flexibility and Mobility:  -Group instruction provided by verbal instruction, patient participation, and written materials to support subject.  Instructors lead participants through series of stretches that are designed to increase flexibility thus improving mobility.  These stretches are additional exercise for major muscle groups that are typically performed during regular warm up and cool down.   Hands Only CPR:  -Group verbal, video, and participation provides a basic overview of AHA guidelines for community CPR. Role-play of emergencies allow participants the opportunity to practice calling for help and chest compression technique with discussion of AED use.   Hypertension: -Group verbal and written instruction that provides a basic overview of  hypertension including the most recent diagnostic guidelines, risk factor reduction with self-care instructions and medication management.   CARDIAC REHAB PHASE II EXERCISE from 01/27/2017 in [place]  Date  01/20/17  Instruction Review Code  2- meets goals/outcomes       Nutrition I class: Heart Healthy Eating:  -Group instruction provided by PowerPoint slides, verbal discussion, and written materials to support subject matter. The instructor gives an explanation and review of the Therapeutic Lifestyle Changes diet recommendations, which includes a discussion on lipid goals, dietary fat, sodium, fiber, plant stanol/sterol esters, sugar, and the components of a well-balanced, healthy diet.   Nutrition II class: Lifestyle Skills:  -Group instruction provided by PowerPoint slides, verbal discussion, and written materials to support subject matter. The instructor gives an explanation and review of label reading, grocery shopping for heart health, heart healthy recipe modifications, and ways to make healthier choices when eating out.   Diabetes Question & Answer:  -Group instruction provided by PowerPoint slides, verbal discussion, and written materials to support subject matter. The instructor gives an explanation and review of diabetes co-morbidities, pre- and post-prandial blood glucose goals, pre-exercise blood glucose goals, signs, symptoms, and treatment of hypoglycemia and hyperglycemia, and foot care basics.   Diabetes Blitz:  -Group instruction provided by PowerPoint slides, verbal discussion, and written materials to support subject matter. The instructor gives an explanation and review of the physiology behind type 1 and type 2 diabetes, diabetes medications and rational behind  using different medications, pre- and post-prandial blood glucose recommendations and Hemoglobin A1c goals, diabetes diet, and exercise including blood glucose guidelines for  exercising safely.    Portion Distortion:  -Group instruction provided by PowerPoint slides, verbal discussion, written materials, and food models to support subject matter. The instructor gives an explanation of serving size versus portion size, changes in portions sizes over the last 20 years, and what consists of a serving from each food group.   Stress Management:  -Group instruction provided by verbal instruction, video, and written materials to support subject matter.  Instructors review role of stress in heart disease and how to cope with stress positively.     Exercising on Your Own:  -Group instruction provided by verbal instruction, power point, and written materials to support subject.  Instructors discuss benefits of exercise, components of exercise, frequency and intensity of exercise, and end points for exercise.  Also discuss use of nitroglycerin and activating EMS.  Review options of places to exercise outside of rehab.  Review guidelines for sex with heart disease.   Cardiac Drugs I:  -Group instruction provided by verbal instruction and written materials to support subject.  Instructor reviews cardiac drug classes: antiplatelets, anticoagulants, beta blockers, and statins.  Instructor discusses reasons, side effects, and lifestyle considerations for each drug class.   Cardiac Drugs II:  -Group instruction provided by verbal instruction and written materials to support subject.  Instructor reviews cardiac drug classes: angiotensin converting enzyme inhibitors (ACE-I), angiotensin II receptor blockers (ARBs), nitrates, and calcium channel blockers.  Instructor discusses reasons, side effects, and lifestyle considerations for each drug class.   CARDIAC REHAB PHASE II EXERCISE from 01/27/2017 in [place]  Date  01/25/17  Instruction Review Code  2- meets goals/outcomes      Anatomy and Physiology of the Circulatory System:  Group verbal and  written instruction and models provide basic cardiac anatomy and physiology, with the coronary electrical and arterial systems. Review of: AMI, Angina, Valve disease, Heart Failure, Peripheral Artery Disease, Cardiac Arrhythmia, Pacemakers, and the ICD.   Other Education:  -Group or individual verbal, written, or video instructions that support the educational goals of the cardiac rehab program.   Knowledge Questionnaire Score:     Knowledge Questionnaire Score - 01/16/17 1113      Knowledge Questionnaire Score   Pre Score 14/28      Core Components/Risk Factors/Patient Goals at Admission:     Personal Goals and Risk Factors at Admission - 01/12/17 1015      Core Components/Risk Factors/Patient Goals on Admission   Diabetes Yes   Intervention Provide education about signs/symptoms and action to take for hypo/hyperglycemia.;Provide education about proper nutrition, including hydration, and aerobic/resistive exercise prescription along with prescribed medications to achieve blood glucose in normal ranges: Fasting glucose 65-99 mg/dL   Expected Outcomes Short Term: Participant verbalizes understanding of the signs/symptoms and immediate care of hyper/hypoglycemia, proper foot care and importance of medication, aerobic/resistive exercise and nutrition plan for blood glucose control.;Long Term: Attainment of HbA1C < 7%.      Core Components/Risk Factors/Patient Goals Review:      Goals and Risk Factor Review    Row Name 01/31/17 0000             Core Components/Risk Factors/Patient Goals Review   Personal Goals Review Weight Management/Obesity;Hypertension;Stress;Lipids       Review Continue to encourage the attendance to cardiac education classess, reinforce often due to pt dementia.  Expected Outcomes Pt will maintain healthy weigh, lipid values, htn and poistive and healthy coping skills.          Core Components/Risk Factors/Patient Goals at Discharge (Final Review):       Goals and Risk Factor Review - 01/31/17 0000      Core Components/Risk Factors/Patient Goals Review   Personal Goals Review Weight Management/Obesity;Hypertension;Stress;Lipids   Review Continue to encourage the attendance to cardiac education classess, reinforce often due to pt dementia.   Expected Outcomes Pt will maintain healthy weigh, lipid values, htn and poistive and healthy coping skills.      ITP Comments:     ITP Comments    Row Name 01/12/17 1231           ITP Comments Medical Director, Dr. Fransico Him          Comments:  Pt is making expected progress toward personal goals after completing 7 sessions. Psychosocial Assessment Pt is brought to rehab by his son [name] who heavily assist pt with cognitive task due to mild dementia.  Pt with no immediate barriers to participating in cardiac rehab. Continue to offer heavy assistance with the flow of the exercise class.  Recommend continued exercise and life style modification education including  stress management and relaxation techniques to decrease cardiac risk profile. Cherre Huger, BSN Cardiac and Training and development officer ;

## 2017-02-01 ENCOUNTER — Encounter (HOSPITAL_COMMUNITY)
Admission: RE | Admit: 2017-02-01 | Discharge: 2017-02-01 | Disposition: A | Discharge status: Home / Self Care | Payer: Medicare Other | Source: Ambulatory Visit | Attending: Internal Medicine | Admitting: Internal Medicine

## 2017-02-01 DIAGNOSIS — Z952 Presence of prosthetic heart valve: Secondary | ICD-10-CM

## 2017-02-01 DIAGNOSIS — Z953 Presence of xenogenic heart valve: Secondary | ICD-10-CM | POA: Diagnosis not present

## 2017-02-01 DIAGNOSIS — Z48812 Encounter for surgical aftercare following surgery on the circulatory system: Secondary | ICD-10-CM | POA: Diagnosis not present

## 2017-02-01 NOTE — Progress Notes (Signed)
Reviewed home exercise with pt today.  Pt plans to walk at local shopping center or with son around neighborhood for exercise, 2x/week in addition to coming to cardiac rehab. Reviewed exercise plan with son for understanding. Reviewed THR, pulse, RPE, sign and symptoms, and when to call 911 or MD.  Also discussed weather considerations and indoor options.  Pt and son voiced understanding.     Torianna Junio [employer]

## 2017-02-02 ENCOUNTER — Ambulatory Visit (HOSPITAL_COMMUNITY)
Admission: RE | Admit: 2017-02-02 | Discharge: 2017-02-02 | Disposition: A | Discharge status: Home / Self Care | Payer: Medicare Other | Source: Ambulatory Visit | Attending: Internal Medicine | Admitting: Internal Medicine

## 2017-02-02 DIAGNOSIS — N281 Cyst of kidney, acquired: Secondary | ICD-10-CM | POA: Diagnosis not present

## 2017-02-02 DIAGNOSIS — N2889 Other specified disorders of kidney and ureter: Secondary | ICD-10-CM | POA: Diagnosis present

## 2017-02-02 MED ORDER — GADOBENATE DIMEGLUMINE 529 MG/ML IV SOLN
20.0000 mL | Freq: Once | INTRAVENOUS | Status: AC
Start: 2017-02-02 — End: 2017-02-02
  Administered 2017-02-02: 20 mL via INTRAVENOUS

## 2017-02-03 ENCOUNTER — Encounter (HOSPITAL_COMMUNITY)
Admission: RE | Admit: 2017-02-03 | Discharge: 2017-02-03 | Disposition: A | Discharge status: Home / Self Care | Payer: Medicare Other | Source: Ambulatory Visit | Attending: Internal Medicine | Admitting: Internal Medicine

## 2017-02-03 DIAGNOSIS — Z48812 Encounter for surgical aftercare following surgery on the circulatory system: Secondary | ICD-10-CM | POA: Diagnosis not present

## 2017-02-03 DIAGNOSIS — Z952 Presence of prosthetic heart valve: Secondary | ICD-10-CM

## 2017-02-03 DIAGNOSIS — Z953 Presence of xenogenic heart valve: Secondary | ICD-10-CM | POA: Diagnosis not present

## 2017-02-06 ENCOUNTER — Encounter (HOSPITAL_COMMUNITY)
Admission: RE | Admit: 2017-02-06 | Discharge: 2017-02-06 | Disposition: A | Discharge status: Home / Self Care | Payer: Medicare Other | Source: Ambulatory Visit | Attending: Internal Medicine | Admitting: Internal Medicine

## 2017-02-06 DIAGNOSIS — Z952 Presence of prosthetic heart valve: Secondary | ICD-10-CM

## 2017-02-06 DIAGNOSIS — C61 Malignant neoplasm of prostate: Secondary | ICD-10-CM | POA: Diagnosis not present

## 2017-02-06 DIAGNOSIS — D3000 Benign neoplasm of unspecified kidney: Secondary | ICD-10-CM | POA: Diagnosis not present

## 2017-02-06 DIAGNOSIS — Z953 Presence of xenogenic heart valve: Secondary | ICD-10-CM | POA: Diagnosis not present

## 2017-02-06 DIAGNOSIS — Z48812 Encounter for surgical aftercare following surgery on the circulatory system: Secondary | ICD-10-CM | POA: Diagnosis not present

## 2017-02-08 ENCOUNTER — Encounter (HOSPITAL_COMMUNITY)
Admission: RE | Admit: 2017-02-08 | Discharge: 2017-02-08 | Disposition: A | Discharge status: Home / Self Care | Payer: Medicare Other | Source: Ambulatory Visit | Attending: Internal Medicine | Admitting: Internal Medicine

## 2017-02-08 DIAGNOSIS — Z952 Presence of prosthetic heart valve: Secondary | ICD-10-CM

## 2017-02-08 DIAGNOSIS — Z48812 Encounter for surgical aftercare following surgery on the circulatory system: Secondary | ICD-10-CM | POA: Diagnosis not present

## 2017-02-08 DIAGNOSIS — Z953 Presence of xenogenic heart valve: Secondary | ICD-10-CM | POA: Diagnosis not present

## 2017-02-09 ENCOUNTER — Telehealth: Payer: Self-pay | Admitting: Internal Medicine

## 2017-02-09 ENCOUNTER — Encounter: Payer: Self-pay | Admitting: Internal Medicine

## 2017-02-09 ENCOUNTER — Ambulatory Visit (INDEPENDENT_AMBULATORY_CARE_PROVIDER_SITE_OTHER): Payer: Medicare Other | Admitting: Internal Medicine

## 2017-02-09 VITALS — BP 130/72 | HR 68 | Ht 67.5 in | Wt 207.0 lb

## 2017-02-09 DIAGNOSIS — E785 Hyperlipidemia, unspecified: Secondary | ICD-10-CM | POA: Diagnosis not present

## 2017-02-09 DIAGNOSIS — Z950 Presence of cardiac pacemaker: Secondary | ICD-10-CM | POA: Diagnosis not present

## 2017-02-09 DIAGNOSIS — I442 Atrioventricular block, complete: Secondary | ICD-10-CM

## 2017-02-09 DIAGNOSIS — I5022 Chronic systolic (congestive) heart failure: Secondary | ICD-10-CM

## 2017-02-09 DIAGNOSIS — I350 Nonrheumatic aortic (valve) stenosis: Secondary | ICD-10-CM

## 2017-02-09 DIAGNOSIS — I10 Essential (primary) hypertension: Secondary | ICD-10-CM

## 2017-02-09 DIAGNOSIS — Z953 Presence of xenogenic heart valve: Secondary | ICD-10-CM | POA: Diagnosis not present

## 2017-02-09 DIAGNOSIS — I2510 Atherosclerotic heart disease of native coronary artery without angina pectoris: Secondary | ICD-10-CM | POA: Diagnosis not present

## 2017-02-09 MED ORDER — CARVEDILOL 6.25 MG PO TABS: 6.2500 mg | ORAL_TABLET | Freq: Two times a day (BID) | ORAL | 6 refills | Status: DC

## 2017-02-09 NOTE — Progress Notes (Signed)
Follow-up Outpatient Visit Date: 02/09/2017  Primary Care Provider: [name], MD [address] [address]  Chief Complaint: Follow-up chronic systolic heart failure, left atrial mass, and aortic stenosis status post AVR  HPI:  James Curtis is a 76 y.o. year-old male with history of nonobstructive coronary artery disease, moderate aortic stenosis status post bioprosthetic aortic valve replacement (11/2016), left atrial mass status post excision (11/2016): TIA, complete heart block status post pacemaker, hypertension, hyperlipidemia, mild carotid artery stenosis, chronic kidney disease, prediabetes, prostate cancer, and mild cognitive impairment who presents for follow-up of heart failure, left atrial mass, and aortic stenosis. He was last seen in our office on 12/26/16 by [name], PA. Since that time, James Curtis notes that his energy has continued to improve. He still has occasional exertional dyspnea with mild activity. He denies chest pain, palpitations, lightheadedness, and edema. He notes that his chest wall seems numb, "as if it is not there." He has not had any other neurologic changes, though his son notes that James Curtis memory is still not back to baseline.  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 LAD with minimal diffuse disease. LCx with mild diffuse disease and 40% stenosis involving OM 3. RCA with 50%  proximal/mid vessel stenosis. Mild to moderate aortic stenosis with mean aortic valve gradient of 16-22 mmHg. RA 4, RV 35/5, PA 35/10 (19), and PCWP 9. LVEDP 10. AO sat 91%. PA sat 67%. Fick CO/CI 7.6/3.6. PVR 1.3.  CV Surgery:  Left atrial mass resection and bioprosthetic aortic valve replacement (12/02/16, Dr. Roxy Manns): Benign dystrophic fibrotic tissue with calcification attached to the posterior mitral annulus by a thin fibrotic stalk. AVR with Edwards Intuity Elite rapid deployment 23 mm bovine pericardial tissue valve.  EP Procedures and Devices:  Dual chamber pacemaker (11/21/16, Dr. Curt Bears): Medtronic Azure XT DR MRI SureScan pacemaker  Non-Invasive Evaluation(s):  TTE (01/16/17): LVEF 40-45% with inferior and inferoseptal hypokinesis. Normal LV size with mild LVH. Grade 1 diastolic dysfunction. Mitral and or calcification with mildly thickened leaflets. Bioprosthetic aortic valve present with mean gradient of 16 mmHg. Severely dilated left atrium. Mildly reduced RV function.  TEE (11/23/16): Normal LV size with severe LVH. LVEF 60-65%. Decreased cusp separation of the aortic valve with restricted mobility of the non-coronary cusp. Mitral annular calcification with leaflet thickening noted. 16 x 4 mm mobile calcified echodensity on the atrial basal surface of the posterior mitral valve leaflet noted. Mild MR. No left atrial appendage thrombus.  TTE (11/20/16): Normal LV size with mild LVH. LVEF 65-70% with normal wall motion. Grade 1 diastolic dysfunction. Moderate aortic stenosis (mean gradient 26 mmHg). Severely calcified aortic annulus with severe valve thickening and moderate calcification. Moderate mitral stenosis. Mild left atrial enlargement. Normal RV size and function. Mild TR.  Recent CV Pertinent Labs: Lab Results  Component Value Date   CHOL 106 11/19/2016   HDL 27 (L) 11/19/2016   LDLCALC 60 11/19/2016   TRIG 97 11/19/2016   CHOLHDL 3.9 11/19/2016   INR 1.53 12/02/2016   K 4.4  12/14/2016   MG 2.5 (H) 12/03/2016   BUN 16 12/14/2016  CREATININE 1.3 12/14/2016   CREATININE 1.46 (H) 12/08/2016    Past medical and surgical history were reviewed and updated in EPIC.  Current Meds  Medication Sig  . amLODipine (NORVASC) 10 MG tablet Take 10 mg by mouth daily.  Marland Kitchen Apoaequorin (PREVAGEN PO) Take 1 tablet by mouth daily. For Brain and Memory Health - OTC  . aspirin EC 325 MG tablet Take 1 tablet (325 mg total) by mouth daily.  . cholecalciferol (VITAMIN D) 1000 units tablet Take 1,000 Units by mouth daily.  . iron polysaccharides (NIFEREX) 150 MG capsule Take 1 capsule (150 mg total) by mouth daily.  . metoprolol tartrate (LOPRESSOR) 25 MG tablet Take 1 tablet (25 mg total) by mouth 2 (two) times daily.  . simvastatin (ZOCOR) 20 MG tablet Take 20 mg by mouth at bedtime.   . Tetrahydrozoline HCl (VISINE OP) Apply to eye as directed.  . valsartan (DIOVAN) 320 MG tablet Take 320 mg by mouth daily.    Allergies: Patient has no known allergies.  Social History   Social History  . Marital status: Widowed    Spouse name: N/A  . Number of children: 2  . Years of education: 12   Occupational History  .      retired   Social History Main Topics  . Smoking status: Former Smoker    Packs/day: 0.25    Types: Cigarettes    Quit date: 11/16/2016  . Smokeless tobacco: Never Used  . Alcohol use No  . Drug use: No  . Sexual activity: Not Currently   Other Topics Concern  . Not on file   Social History Narrative   Lives alone   Caffeine     No family history on file.  Review of Systems: A 12-system review of systems was performed and was negative except as noted in the HPI.  --------------------------------------------------------------------------------------------------  Physical Exam: BP 130/72   Pulse 68   Ht 5' 7.5" (1.715 m)   Wt 207 lb (93.9 kg)   BMI 31.94 kg/m   General:  Overweight man, seated comfortably on the exam table. He is accompanied  by his son. HEENT: No conjunctival pallor or scleral icterus. Moist mucous membranes.  OP clear. Neck: Supple without lymphadenopathy, thyromegaly, JVD, or HJR. Lungs: Normal work of breathing. Clear to auscultation bilaterally without wheezes or crackles. Heart: Regular rate and rhythm without murmurs, rubs, or gallops. Crisp S1 and S2 noted. Non-displaced PMI. Median sternotomy incision healing well without tenderness or skin breakdown. Abd: Bowel sounds present. Soft, NT/ND without hepatosplenomegaly Ext: No lower extremity edema. Radial, PT, and DP pulses are 2+ bilaterally. Skin: Warm and dry without rash.  EKG (12/26/16): Normal sinus rhythm with ventricular pacing.  Lab Results  Component Value Date   WBC 9.8 12/14/2016   HGB 8.8 (A) 12/14/2016   HCT 27 (A) 12/14/2016   MCV 88.6 12/08/2016   PLT 244 12/14/2016    Lab Results  Component Value Date   NA 146 12/14/2016   K 4.4 12/14/2016   CL 111 12/08/2016   CO2 23 12/08/2016   BUN 16 12/14/2016   CREATININE 1.3 12/14/2016   GLUCOSE 109 (H) 12/08/2016   ALT 18 12/05/2016    Lab Results  Component Value Date   CHOL 106 11/19/2016   HDL 27 (L) 11/19/2016   LDLCALC 60 11/19/2016   TRIG 97 11/19/2016   CHOLHDL 3.9 11/19/2016    --------------------------------------------------------------------------------------------------  ASSESSMENT AND PLAN: Chronic systolic heart failure Patient previously had preserved LV  function, though most recent echo notes moderately reduced LV contraction. I have reviewed the echo images and feel that his LVEF is likely low normal to mildly reduced. Wall motion is difficult to assess in the setting of significant LVH and dyssynchrony secondary to RV pacing. Mr. Fullilove appears euvolemic and well compensated with NYHA class II symptoms. An effort to optimize his evidence-based heart failure therapy, we will switch from metoprolol to carvedilol 6.25 mg twice a day, to be uptitrated in the  future as blood pressure allows. We will continue current dose of valsartan.  Aortic stenosis status post bioprosthetic aortic valve No evidence of valve dysfunction by exam or on recent echo. Patient should continue with aspirin 325 mg daily for at least 3 months following valve replacement, as per Dr. Roxy Manns. Continue follow-up with Dr. Roxy Manns, as previously arranged. Patient will need antibiotic prophylaxis for any dental procedures.  Left atrial mass Benign dystrophic fibrotic tissue removed. Patient tolerated the procedure well. Question if this may have caused his TIA due to an embolic event. Continue follow-up with Dr. Roxy Manns.  Hypertension Blood pressure is upper normal today. As above, we will switch metoprolol to carvedilol, which may have some beneficial blood pressure affect.  Hyperlipidemia Most recent LDL was 60 this spring. In the setting of CAD, continuation of current dose of moderate-intensity statin therapy is recommended.  Nonobstructive coronary artery disease No angina to suggest worsening coronary insufficiency. Continue with medical therapy to prevent progression.  Complete heart block Patient is status post dual-chamber pacemaker (continue follow-up and device monitoring with Dr. Curt Bears and the device clinic.  Follow-up: Return to clinic in 1 month.  Nelva Bush, MD 02/09/2017 11:44 AM

## 2017-02-09 NOTE — Patient Instructions (Signed)
Medication Instructions:  Your physician has recommended you make the following change in your medication:  Stop metoprolol. Start Carvedilol 6.25 mg by mouth twice daily.    Labwork: none  Testing/Procedures: none  Follow-Up: Your physician recommends that you schedule a follow-up appointment in:  One month with Dr. Saunders Revel or NP/PA    Any Other Special Instructions Will Be Listed Below (If Applicable).     If you need a refill on your cardiac medications before your next appointment, please call your pharmacy.

## 2017-02-09 NOTE — Telephone Encounter (Signed)
Records received from [facility]. Placed in chart Prep.

## 2017-02-10 ENCOUNTER — Encounter (HOSPITAL_COMMUNITY)
Admission: RE | Admit: 2017-02-10 | Discharge: 2017-02-10 | Disposition: A | Discharge status: Home / Self Care | Payer: Medicare Other | Source: Ambulatory Visit | Attending: Internal Medicine | Admitting: Internal Medicine

## 2017-02-10 DIAGNOSIS — Z952 Presence of prosthetic heart valve: Secondary | ICD-10-CM

## 2017-02-10 DIAGNOSIS — Z48812 Encounter for surgical aftercare following surgery on the circulatory system: Secondary | ICD-10-CM | POA: Diagnosis not present

## 2017-02-10 DIAGNOSIS — Z953 Presence of xenogenic heart valve: Secondary | ICD-10-CM | POA: Diagnosis not present

## 2017-02-10 NOTE — Progress Notes (Signed)
James Curtis 76 y.o. male Nutrition Note Spoke with pt. Pt's dx of dementia noted. Nutrition Plan and Nutrition Survey goals reviewed with pt. Pt is following Step 1 of the Therapeutic Lifestyle Changes diet. Pt is a diet-controlled diabetic. Last A1c indicates blood glucose well-controlled.   Lab Results  Component Value Date   HGBA1C 6.2 (H) 11/19/2016   Wt Readings from Last 3 Encounters:  02/09/17 207 lb (93.9 kg)  01/12/17 210 lb 5.1 oz (95.4 kg)  01/02/17 211 lb (95.7 kg)    Nutrition Diagnosis ? Food-and nutrition-related knowledge deficit related to lack of exposure to information as related to diagnosis of: ? CVD ? DM  Nutrition Intervention ? Pt's individual nutrition plan reviewed with pt. ? Benefits of adopting Therapeutic Lifestyle Changes discussed when Medficts reviewed.   ? Continue client-centered nutrition education by RD, as part of interdisciplinary care. Goal(s) ? Pt to maintain his wt given dx of dementia. If pt loses wt, wt loss of no more than 1-2 lb/week recommended.  Derek Mound, M.Ed, RD, LDN, CDE 02/10/2017 12:12 PM

## 2017-02-11 ENCOUNTER — Encounter: Payer: Self-pay | Admitting: Internal Medicine

## 2017-02-11 DIAGNOSIS — I5042 Chronic combined systolic (congestive) and diastolic (congestive) heart failure: Secondary | ICD-10-CM | POA: Insufficient documentation

## 2017-02-13 ENCOUNTER — Encounter (HOSPITAL_COMMUNITY)
Admission: RE | Admit: 2017-02-13 | Discharge: 2017-02-13 | Disposition: A | Discharge status: Home / Self Care | Payer: Medicare Other | Source: Ambulatory Visit | Attending: Internal Medicine | Admitting: Internal Medicine

## 2017-02-13 DIAGNOSIS — Z48812 Encounter for surgical aftercare following surgery on the circulatory system: Secondary | ICD-10-CM | POA: Diagnosis not present

## 2017-02-13 DIAGNOSIS — Z953 Presence of xenogenic heart valve: Secondary | ICD-10-CM | POA: Diagnosis not present

## 2017-02-13 DIAGNOSIS — Z952 Presence of prosthetic heart valve: Secondary | ICD-10-CM

## 2017-02-17 ENCOUNTER — Encounter (HOSPITAL_COMMUNITY)
Admission: RE | Admit: 2017-02-17 | Discharge: 2017-02-17 | Disposition: A | Discharge status: Home / Self Care | Payer: Medicare Other | Source: Ambulatory Visit | Attending: Internal Medicine | Admitting: Internal Medicine

## 2017-02-17 DIAGNOSIS — Z952 Presence of prosthetic heart valve: Secondary | ICD-10-CM | POA: Diagnosis not present

## 2017-02-17 DIAGNOSIS — Z953 Presence of xenogenic heart valve: Secondary | ICD-10-CM | POA: Diagnosis not present

## 2017-02-17 DIAGNOSIS — Z48812 Encounter for surgical aftercare following surgery on the circulatory system: Secondary | ICD-10-CM | POA: Diagnosis not present

## 2017-02-20 ENCOUNTER — Encounter (HOSPITAL_COMMUNITY)
Admission: RE | Admit: 2017-02-20 | Discharge: 2017-02-20 | Disposition: A | Discharge status: Home / Self Care | Payer: Medicare Other | Source: Ambulatory Visit | Attending: Internal Medicine | Admitting: Internal Medicine

## 2017-02-20 DIAGNOSIS — Z953 Presence of xenogenic heart valve: Secondary | ICD-10-CM | POA: Diagnosis not present

## 2017-02-20 DIAGNOSIS — Z48812 Encounter for surgical aftercare following surgery on the circulatory system: Secondary | ICD-10-CM | POA: Diagnosis not present

## 2017-02-20 DIAGNOSIS — Z952 Presence of prosthetic heart valve: Secondary | ICD-10-CM

## 2017-02-22 ENCOUNTER — Encounter (HOSPITAL_COMMUNITY)
Admission: RE | Admit: 2017-02-22 | Discharge: 2017-02-22 | Disposition: A | Discharge status: Home / Self Care | Payer: Medicare Other | Source: Ambulatory Visit | Attending: Internal Medicine | Admitting: Internal Medicine

## 2017-02-22 ENCOUNTER — Ambulatory Visit (INDEPENDENT_AMBULATORY_CARE_PROVIDER_SITE_OTHER): Payer: Medicare Other | Admitting: Cardiology

## 2017-02-22 ENCOUNTER — Encounter: Payer: Self-pay | Admitting: Cardiology

## 2017-02-22 VITALS — BP 126/68 | HR 61 | Ht 67.0 in | Wt 208.4 lb

## 2017-02-22 DIAGNOSIS — I10 Essential (primary) hypertension: Secondary | ICD-10-CM | POA: Diagnosis not present

## 2017-02-22 DIAGNOSIS — Z950 Presence of cardiac pacemaker: Secondary | ICD-10-CM

## 2017-02-22 DIAGNOSIS — Z953 Presence of xenogenic heart valve: Secondary | ICD-10-CM | POA: Diagnosis not present

## 2017-02-22 DIAGNOSIS — Z952 Presence of prosthetic heart valve: Secondary | ICD-10-CM | POA: Diagnosis not present

## 2017-02-22 DIAGNOSIS — E782 Mixed hyperlipidemia: Secondary | ICD-10-CM | POA: Diagnosis not present

## 2017-02-22 DIAGNOSIS — I350 Nonrheumatic aortic (valve) stenosis: Secondary | ICD-10-CM

## 2017-02-22 DIAGNOSIS — I2510 Atherosclerotic heart disease of native coronary artery without angina pectoris: Secondary | ICD-10-CM

## 2017-02-22 DIAGNOSIS — I442 Atrioventricular block, complete: Secondary | ICD-10-CM

## 2017-02-22 DIAGNOSIS — Z48812 Encounter for surgical aftercare following surgery on the circulatory system: Secondary | ICD-10-CM | POA: Diagnosis not present

## 2017-02-22 LAB — CUP PACEART INCLINIC DEVICE CHECK
Brady Statistic AP VS Percent: 0.02 %
Brady Statistic AS VP Percent: 68.72 %
Brady Statistic AS VS Percent: 0.02 %
Implantable Lead Implant Date: 20180409
Implantable Lead Location: 753859
Implantable Lead Model: 5076
Implantable Lead Model: 5076
Lead Channel Impedance Value: 247 Ohm
Lead Channel Impedance Value: 342 Ohm
Lead Channel Impedance Value: 456 Ohm
Lead Channel Pacing Threshold Amplitude: 0.5 V
Lead Channel Pacing Threshold Pulse Width: 0.4 ms
Lead Channel Sensing Intrinsic Amplitude: 15.375 mV
Lead Channel Setting Pacing Amplitude: 2 V
Lead Channel Setting Pacing Amplitude: 2.5 V
Lead Channel Setting Sensing Sensitivity: 4 mV
MDC IDC LEAD IMPLANT DT: 20180409
MDC IDC LEAD LOCATION: 753860
MDC IDC MSMT BATTERY REMAINING LONGEVITY: 131 mo
MDC IDC MSMT BATTERY VOLTAGE: 3.15 V
MDC IDC MSMT LEADCHNL RA PACING THRESHOLD AMPLITUDE: 1 V
MDC IDC MSMT LEADCHNL RA PACING THRESHOLD PULSEWIDTH: 0.4 ms
MDC IDC MSMT LEADCHNL RA SENSING INTR AMPL: 1.25 mV
MDC IDC MSMT LEADCHNL RV IMPEDANCE VALUE: 418 Ohm
MDC IDC PG IMPLANT DT: 20180409
MDC IDC SESS DTM: 20180711124532
MDC IDC SET LEADCHNL RV PACING PULSEWIDTH: 0.4 ms
MDC IDC STAT BRADY AP VP PERCENT: 31.24 %
MDC IDC STAT BRADY RA PERCENT PACED: 31.11 %
MDC IDC STAT BRADY RV PERCENT PACED: 99.96 %

## 2017-02-22 NOTE — Patient Instructions (Signed)
Medication Instructions:    Your physician recommends that you continue on your current medications as directed. Please refer to the Current Medication list given to you today.  --- If you need a refill on your cardiac medications before your next appointment, please call your pharmacy. ---  Labwork:  None ordered  Testing/Procedures:  None ordered  Follow-Up: Remote monitoring is used to monitor your Pacemaker of ICD from home. This monitoring reduces the number of office visits required to check your device to one time per year. It allows [place] to keep an eye on the functioning of your device to ensure it is working properly. You are scheduled for a device check from home on 05/24/2017. You may send your transmission at any time that day. If you have a wireless device, the transmission will be sent automatically. After your physician reviews your transmission, you will receive a postcard with your next transmission date.   Your physician wants you to follow-up in: 9 months with Dr. Curt Bears.  You will receive a reminder letter in the mail two months in advance. If you don't receive a letter, please call our office to schedule the follow-up appointment.   Any Other Special Instructions Will Be Listed Below (If Applicable).     Thank you for choosing [facility]!!   Trinidad Curet, RN [phone]

## 2017-02-22 NOTE — Progress Notes (Signed)
Electrophysiology Office Note   Date:  02/22/2017   ID:  James Curtis, DOB 1941-06-06, MRN [id number]  PCP:  [name], MD  Cardiologist:  End Primary Electrophysiologist:  Sandeep Delagarza [name], MD    Chief Complaint  Patient presents with  . Pacemaker Check    Complete Heart block     History of Present Illness: James Curtis is a 76 y.o. male who is being seen today for the evaluation of complete AV block at the request of [name], MD. Presenting today for electrophysiology evaluation. He has history of nonobstructive coronary disease, moderate aortic stenosis status post bioprosthetic aortic valve replacement, left atrial mass post excision (11/2016), TIA, complete heart block status post pacemaker, hypertension, hyperlipidemia, CAD, mild cognitive impairment.    Today, he denies symptoms of palpitations, chest pain, shortness of breath, orthopnea, PND, lower extremity edema, claudication, dizziness, presyncope, syncope, bleeding, or neurologic sequela. The patient is tolerating medications without difficulties.  He seems to be getting stronger after his operation. Physically, his son feels that he is doing much better. Unfortunately, his son also feels that his mental status is not back to where it was. He is more forgetful and is taking longer to answer complex questions.   Past Medical History:  Diagnosis Date  . Aortic stenosis    a. 11/20/2016 Echo: mod AS;  b. 11/23/2016 TEE restricted mobility of noncoronary cusp;  c. 11/2016 Cath: mean grad 583mHg w/ dual lumen catheter, AVA 2.0cm^2; mean gradient by pullback = 1683mg, AVA 2.3cm^2.  . Cardiomyopathy ([facility]   Echo 6/18: EF 40-45, inf/inf-septal HK-AK, Gr 1 DD, severe LVH, MAC, severe LAE, mildly reduced RVSF  . Carotid arterial disease ([facility]   a. 11/2016 Carotid U/S: bilat 1-39% ICA stenosis.  . Chronic kidney disease   . Complete heart block ([facility]   a. 11/21/2016 s/p MDT AzSantina EvansR MRI SureScan (ser #  [id number]).  . Dementia    mild  . Family history of adverse reaction to anesthesia    Son James Curtis a difficult time waking up  . Heart murmur   . Hypercholesteremia   . Hypertension    benign  . Incidental pulmonary nodule, greater than or equal to 83m41m needs f/u scan 3 months 11/28/2016   14 x 8 mm (mean diameter of 11 mm) left lower lobe pulmonary nodule with some surrounding ground-glass attenuation (axial image 42 of series 6). 9 x 5 mm (mean diameter of 7 mm) subpleural nodule in the periphery of the right upper lobe (axial image 20 of series 6). A few patchy areas of peripheral predominant ground-glass attenuation and subpleural reticulation are noted, scattered randomly throu  . Left atrial mass   . 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 11/2009 TEE: EF 60-65%, restricted mobility of noncoronary AoV cusp, 16x4 mm mobile Ca2+ echodensity on the atrial basal surface of the posterior MV leaflet, mild MR, no LA/LAA thrombus.  . Non-obstructive CAD (coronary artery disease)    a. 11/2016 Cath: LM large, nl, LAD min irregs, LCX large, min irregs, OM3 40, RCA large, 69m483m. Pre-diabetes   . [facility]  . Renal mass of unknown nature - needs f/u MRI 11/28/2016   Multiple renal lesions are noted in the kidneys bilaterally. Although several of these lesions are low-attenuation, compatible with cysts, other lesions are intermediate to high attenuation, potentially enhancing  . S/P aortic valve replacement with  bioprosthetic valve 12/02/2016   23 mm Edwards Intuity rapid deployment bovine pericardial tissue valve  . S/P resection of left atrial mass 12/02/2016   Benign dystrophic fibrotic tissue with calcification attached to posterior mitral annulus by thin fibrotic stalk  . TIA (transient ischemic attack)    a. 11/2016 in setting of complete heart block.   Past Surgical History:  Procedure Laterality Date  . AORTIC VALVE REPLACEMENT N/A 12/02/2016    Procedure: AORTIC VALVE REPLACEMENT (AVR) using a 23 Edwards Intuity Elite Aortic Valve;  Surgeon: Rexene Alberts, MD;  Location: [facility];  Service: Open Heart Surgery;  Laterality: N/A;  . EXCISION OF ATRIAL MYXOMA N/A 12/02/2016   Procedure: Resection of left atrial mass ;  Surgeon: Rexene Alberts, MD;  Location: [facility];  Service: Open Heart Surgery;  Laterality: N/A;  . PACEMAKER IMPLANT N/A 11/21/2016   Procedure: Pacemaker Implant;  Surgeon: Deisha Stull [name], MD;  Location: [facility] [facility];  Service: Cardiovascular;  Laterality: N/A;  . RIGHT/LEFT HEART CATH AND CORONARY ANGIOGRAPHY N/A 11/25/2016   Procedure: Right/Left Heart Cath and Coronary Angiography;  Surgeon: Nelva Bush, MD;  Location: [facility];  Service: Cardiovascular;  Laterality: N/A;  . TEE WITHOUT CARDIOVERSION N/A 11/23/2016   Procedure: TRANSESOPHAGEAL ECHOCARDIOGRAM (TEE);  Surgeon: Jerline Pain, MD;  Location: [facility];  Service: Cardiovascular;  Laterality: N/A;  . TEE WITHOUT CARDIOVERSION N/A 12/02/2016   Procedure: TRANSESOPHAGEAL ECHOCARDIOGRAM (TEE);  Surgeon: Rexene Alberts, MD;  Location: [place];  Service: Open Heart Surgery;  Laterality: N/A;     Current Outpatient Prescriptions  Medication Sig Dispense Refill  . amLODipine (NORVASC) 10 MG tablet Take 10 mg by mouth daily.  11  . Apoaequorin (PREVAGEN PO) Take 1 tablet by mouth daily. For Brain and Memory Health - OTC    . aspirin EC 325 MG tablet Take 1 tablet (325 mg total) by mouth daily.    . carvedilol (COREG) 6.25 MG tablet Take 1 tablet (6.25 mg total) by mouth 2 (two) times daily. 60 tablet 6  . cholecalciferol (VITAMIN D) 1000 units tablet Take 1,000 Units by mouth daily.    . iron polysaccharides (NIFEREX) 150 MG capsule Take 1 capsule (150 mg total) by mouth daily.    . simvastatin (ZOCOR) 20 MG tablet Take 20 mg by mouth at bedtime.     . Tetrahydrozoline HCl (VISINE OP) Apply to eye as directed.    . valsartan (DIOVAN) 320 MG  tablet Take 320 mg by mouth daily.     No current facility-administered medications for this visit.     Allergies:   Patient has no known allergies.   Social History:  The patient  reports that he quit smoking about 3 months ago. His smoking use included Cigarettes. He smoked 0.25 packs per day. He has never used smokeless tobacco. He reports that he does not drink alcohol or use drugs.   Family History:  The patient's family history includes Cancer in his father.    ROS:  Please see the history of present illness.   Otherwise, review of systems is positive for memory issues.   All other systems are reviewed and negative.    PHYSICAL EXAM: VS:  BP 126/68   Pulse 61   Ht _0  (1.702 m)   Wt 208 lb 6.4 oz (94.5 kg)   BMI 32.64 kg/m  , BMI Body mass index is 32.64 kg/m. GEN: Well nourished, well developed, in no acute distress  HEENT: normal  Neck: no JVD, carotid bruits, or masses Cardiac: RRR; no murmurs, rubs, or gallops,no edema  Respiratory:  clear to auscultation bilaterally, normal work of breathing GI: soft, nontender, nondistended, + BS MS: no deformity or atrophy  Skin: warm and dry, device pocket is well healed Neuro:  Strength and sensation are intact Psych: euthymic mood, full affect  EKG:  EKG is ordered today. Personal review of the ekg ordered shows A sense, V pace   Device interrogation is reviewed today in detail.  See PaceArt for details.   Recent Labs: 12/03/2016: Magnesium 2.5 12/05/2016: ALT 18 12/14/2016: BUN 16; Creatinine 1.3; Hemoglobin 8.8; Platelets 244; Potassium 4.4; Sodium 146    Lipid Panel     Component Value Date/Time   CHOL 106 11/19/2016 0323   TRIG 97 11/19/2016 0323   HDL 27 (L) 11/19/2016 0323   CHOLHDL 3.9 11/19/2016 0323   VLDL 19 11/19/2016 0323   LDLCALC 60 11/19/2016 0323     Wt Readings from Last 3 Encounters:  02/22/17 208 lb 6.4 oz (94.5 kg)  02/09/17 207 lb (93.9 kg)  01/12/17 210 lb 5.1 oz (95.4 kg)      Other  studies Reviewed: Additional studies/ records that were reviewed today include: TTE 01/26/17  Review of the above records today demonstrates:  - Left ventricle: LVEF is approximately 40 to 45% with   hypokinesis/akinesis of the mid/distal inferior and inferoseptal   walls and apex. The cavity size was normal. Wall thickness was   increased in a pattern of severe LVH. Doppler parameters are   consistent with abnormal left ventricular relaxation (grade 1   diastolic dysfunction). - Mitral valve: Calcified annulus. Mildly thickened leaflets . - Left atrium: The atrium was severely dilated. - Right ventricle: Systolic function was mildly reduced.   ASSESSMENT AND PLAN:  1.  Complete AV block: s/p Medtronic dual chamber pacemaker 11/21/16.   2. Aortic stenosis status post bioprosthetic aortic valve: No evidence of valve dysfunction on recent echo. Plan for aspirin for 3 months. Follow-up with Dr. Roxy Manns.  3. Chronic systolic heart failure: Have preserved LV function at the time of pacemaker implant and surgery for his aortic valve and left atrial mass. LV function has decreased. Recently switched to carvedilol 6.25 mg twice a day. If he does continue to be symptomatic, he may benefit from an LV lead in the future.  4. Left atrial mass: Benign dystrophic fibrotic tissue removed. Likely the cause of his TIA. Continue follow-up with Dr. Roxy Manns.  5. Hypertension: well controlled, no changes   6. Hyperlipidemia: continue statin  7. Nonobstructive coronary disease: No current angina. Continue with medical therapy    Current medicines are reviewed at length with the patient today.   The patient does not have concerns regarding his medicines.  The following changes were made today:  none  Labs/ tests ordered today include:  Orders Placed This Encounter  Procedures  . EKG 12-Lead     Disposition:   FU with Illianna Paschal 9 months  Signed, Layana Konkel [name], MD  02/22/2017 9:05 AM     [facility]  [facility] [address] [place] [address] [phone] (office) [phone] (fax)

## 2017-02-24 ENCOUNTER — Encounter (HOSPITAL_COMMUNITY): Payer: Medicare Other

## 2017-02-24 ENCOUNTER — Encounter: Payer: Self-pay | Admitting: Physician Assistant

## 2017-02-26 NOTE — Progress Notes (Signed)
Cardiac Individual Treatment Plan  Patient Details  Name: James Curtis MRN: [id number] Date of Birth: [birth year] Referring Provider:     [place] from 01/12/2017 in [place]  Referring Provider  End, [name]  MD      Initial Encounter Date:    CARDIAC REHAB PHASE II ORIENTATION from 01/12/2017 in [place]  Date  01/12/17  Referring Provider  End, [name]  MD      Visit Diagnosis: 12/02/16 S/P AVR (aortic valve replacement)  Patient's Home Medications on Admission:  Current Outpatient Prescriptions:  .  amLODipine (NORVASC) 10 MG tablet, Take 10 mg by mouth daily., Disp: , Rfl: 11 .  Apoaequorin (PREVAGEN PO), Take 1 tablet by mouth daily. For Brain and Memory Health - OTC, Disp: , Rfl:  .  aspirin EC 325 MG tablet, Take 1 tablet (325 mg total) by mouth daily., Disp: , Rfl:  .  carvedilol (COREG) 6.25 MG tablet, Take 1 tablet (6.25 mg total) by mouth 2 (two) times daily., Disp: 60 tablet, Rfl: 6 .  cholecalciferol (VITAMIN D) 1000 units tablet, Take 1,000 Units by mouth daily., Disp: , Rfl:  .  iron polysaccharides (NIFEREX) 150 MG capsule, Take 1 capsule (150 mg total) by mouth daily., Disp: , Rfl:  .  simvastatin (ZOCOR) 20 MG tablet, Take 20 mg by mouth at bedtime. , Disp: , Rfl:  .  Tetrahydrozoline HCl (VISINE OP), Apply to eye as directed., Disp: , Rfl:  .  valsartan (DIOVAN) 320 MG tablet, Take 320 mg by mouth daily., Disp: , Rfl:   Past Medical History: Past Medical History:  Diagnosis Date  . Aortic stenosis    a. 11/20/2016 Echo: mod AS;  b. 11/23/2016 TEE restricted mobility of noncoronary cusp;  c. 11/2016 Cath: mean grad 110mHg w/ dual lumen catheter, AVA 2.0cm^2; mean gradient by pullback = 141mg, AVA 2.3cm^2.  . Cardiomyopathy ([facility]   Echo 6/18: EF 40-45, inf/inf-septal HK-AK, Gr 1 DD, severe LVH, MAC, severe LAE, mildly reduced RVSF  . Carotid arterial disease ([facility]    a. 11/2016 Carotid U/S: bilat 1-39% ICA stenosis.  . Chronic kidney disease   . Complete heart block ([facility]   a. 11/21/2016 s/p MDT AzSantina EvansR MRI SureScan (ser # [id number]).  . Dementia    mild  . Family history of adverse reaction to anesthesia    Son [name] a difficult time waking up  . Heart murmur   . Hypercholesteremia   . Hypertension    benign  . Incidental pulmonary nodule, greater than or equal to 1m55m needs f/u scan 3 months 11/28/2016   14 x 8 mm (mean diameter of 11 mm) left lower lobe pulmonary nodule with some surrounding ground-glass attenuation (axial image 42 of series 6). 9 x 5 mm (mean diameter of 7 mm) subpleural nodule in the periphery of the right upper lobe (axial image 20 of series 6). A few patchy areas of peripheral predominant ground-glass attenuation and subpleural reticulation are noted, scattered randomly throu  . Left atrial mass   . 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 11/2009 TEE: EF 60-65%, restricted mobility of noncoronary AoV cusp, 16x4 mm mobile Ca2+ echodensity on the atrial basal surface of the posterior MV leaflet, mild MR, no LA/LAA thrombus.  . Non-obstructive CAD (coronary artery disease)    a. 11/2016 Cath: LM large, nl, LAD min irregs, LCX large, min  irregs, OM3 40, RCA large, 86m   . Pre-diabetes   . Prostate cancer ([facility]    s/p radiation  . Renal mass of unknown nature - needs f/u MRI 11/28/2016   Multiple renal lesions are noted in the kidneys bilaterally. Although several of these lesions are low-attenuation, compatible with cysts, other lesions are intermediate to high attenuation, potentially enhancing  . S/P aortic valve replacement with bioprosthetic valve 12/02/2016   23 mm Edwards Intuity rapid deployment bovine pericardial tissue valve  . S/P resection of left atrial mass 12/02/2016   Benign dystrophic fibrotic tissue with calcification attached to posterior mitral annulus by thin fibrotic stalk  . TIA (transient  ischemic attack)    a. 11/2016 in setting of complete heart block.    Tobacco Use: History  Smoking Status  . Former Smoker  . Packs/day: 0.25  . Types: Cigarettes  . Quit date: 11/16/2016  Smokeless Tobacco  . Never Used    Labs: Recent Review Flowsheet Data    Labs for ITP Cardiac and Pulmonary Rehab Latest Ref Rng & Units 12/02/2016 12/02/2016 12/02/2016 12/02/2016 12/03/2016   Cholestrol 0 - 200 mg/dL - - - - -   LDLCALC 0 - 99 mg/dL - - - - -   HDL >40 mg/dL - - - - -   Trlycerides <150 mg/dL - - - - -   Hemoglobin A1c 4.8 - 5.6 % - - - - -   PHART 7.350 - 7.450 7.320(L) - 7.292(L) 7.313(L) -   PCO2ART 32.0 - 48.0 mmHg 45.0 - 44.5 46.2 -   HCO3 20.0 - 28.0 mmol/L 23.5 - 21.5 23.5 -   TCO2 0 - 100 mmol/L _0 ACIDBASEDEF 0.0 - 2.0 mmol/L 3.0(H) - 5.0(H) 3.0(H) -   O2SAT % 99.0 - 91.0 91.0 -      Capillary Blood Glucose: Lab Results  Component Value Date   GLUCAP 128 (H) 01/20/2017   GLUCAP 89 01/20/2017   GLUCAP 156 (H) 01/16/2017   GLUCAP 75 12/05/2016   GLUCAP 66 12/05/2016     Exercise Target Goals:    Exercise Program Goal: Individual exercise prescription set with THRR, safety & activity barriers. Participant demonstrates ability to understand and report RPE using BORG scale, to self-measure pulse accurately, and to acknowledge the importance of the exercise prescription.  Exercise Prescription Goal: Starting with aerobic activity 30 plus minutes a day, 3 days per week for initial exercise prescription. Provide home exercise prescription and guidelines that participant acknowledges understanding prior to discharge.  Activity Barriers & Risk Stratification:   6 Minute Walk:     6 Minute Walk    Row Name 01/12/17 1238         6 Minute Walk   Phase Initial     Distance 1564 feet     Walk Time 6 minutes     # of Rest Breaks 0     MPH 2.96     METS 3.02     RPE 13     VO2 Peak 10.58     Symptoms Yes (comment)     Comments fatigue at end  of test     Resting HR 77 bpm     Resting BP 130/72     Max Ex. HR 109 bpm     Max Ex. BP 162/68     2 Minute Post BP 128/72        Oxygen Initial Assessment:   Oxygen Re-Evaluation:  Oxygen Discharge (Final Oxygen Re-Evaluation):   Initial Exercise Prescription:     Initial Exercise Prescription - 01/12/17 1300      Date of Initial Exercise RX and Referring Provider   Date 01/12/17   Referring Provider End, [name]  MD     Recumbant Bike   Level 2.5   Minutes 10   METs 2.3     NuStep   Level 3   SPM 70   Minutes 10   METs 2.2     Track   Laps 10   Minutes 10   METs 2.74     Prescription Details   Frequency (times per week) 3   Duration Progress to 30 minutes of continuous aerobic without signs/symptoms of physical distress     Intensity   THRR 40-80% of Max Heartrate 58-115   Ratings of Perceived Exertion 11-13   Perceived Dyspnea 0-4     Progression   Progression Continue to progress workloads to maintain intensity without signs/symptoms of physical distress.     Resistance Training   Training Prescription No   Weight 0      Perform Capillary Blood Glucose checks as needed.  Exercise Prescription Changes:     Exercise Prescription Changes    Row Name 01/19/17 1200             Response to Exercise   Blood Pressure (Admit) 120/72       Blood Pressure (Exercise) 122/70       Blood Pressure (Exit) 112/62       Heart Rate (Admit) 64 bpm       Heart Rate (Exercise) 87 bpm       Heart Rate (Exit) 64 bpm       Rating of Perceived Exertion (Exercise) 12       Symptoms none       Comments pt was oriented to exercise equipment       Duration Continue with 30 min of aerobic exercise without signs/symptoms of physical distress.       Intensity THRR unchanged         Progression   Progression Continue to progress workloads to maintain intensity without signs/symptoms of physical distress.       Average METs 2         Resistance  Training   Training Prescription No       Weight 0         Recumbant Bike   Level 2       Minutes 10       METs 1.6         NuStep   Level 3       SPM 70       Minutes 10       METs 1.7         Track   Laps 10       Minutes 10       METs 2.74          Exercise Comments:   Exercise Goals and Review:     Exercise Goals    Row Name 01/12/17 1323             Exercise Goals   Increase Physical Activity Yes       Intervention Provide advice, education, support and counseling about physical activity/exercise needs.;Develop an individualized exercise prescription for aerobic and resistive training based on initial evaluation findings, risk stratification, comorbidities and participant's personal goals.  Expected Outcomes Achievement of increased cardiorespiratory fitness and enhanced flexibility, muscular endurance and strength shown through measurements of functional capacity and personal statement of participant.       Increase Strength and Stamina Yes  Return to normal activities       Intervention Provide advice, education, support and counseling about physical activity/exercise needs.;Develop an individualized exercise prescription for aerobic and resistive training based on initial evaluation findings, risk stratification, comorbidities and participant's personal goals.       Expected Outcomes Achievement of increased cardiorespiratory fitness and enhanced flexibility, muscular endurance and strength shown through measurements of functional capacity and personal statement of participant.          Exercise Goals Re-Evaluation :     Exercise Goals Re-Evaluation    Row Name 01/30/17 1650 01/30/17 1651 02/01/17 1212         Exercise Goal Re-Evaluation   Exercise Goals Review Increase Physical Activity;Increase Strenth and Stamina  - Increase Strenth and Stamina;Increase Physical Activity     Comments  - Pt is tolerating exercise really well and is able to  exercise for 30 minutes without difficulty. Reviewed home exercise with pt today.  Pt plans to walk at local shopping center or with son around neighborhood for exercise, 2x/week in addition to coming to cardiac rehab. Reviewed exercise plan with son for understanding. Reviewed THR, pulse, RPE, sign and symptoms, and when to call 911 or MD.  Also discussed weather considerations and indoor options.  Pt and son voiced understanding.     Expected Outcomes  - Pt will continue to improve in cardiorespiratory fitness Pt will be compliant with home exercise and improve in cardiorespiratory fitness.         Discharge Exercise Prescription (Final Exercise Prescription Changes):     Exercise Prescription Changes - 01/19/17 1200      Response to Exercise   Blood Pressure (Admit) 120/72   Blood Pressure (Exercise) 122/70   Blood Pressure (Exit) 112/62   Heart Rate (Admit) 64 bpm   Heart Rate (Exercise) 87 bpm   Heart Rate (Exit) 64 bpm   Rating of Perceived Exertion (Exercise) 12   Symptoms none   Comments pt was oriented to exercise equipment   Duration Continue with 30 min of aerobic exercise without signs/symptoms of physical distress.   Intensity THRR unchanged     Progression   Progression Continue to progress workloads to maintain intensity without signs/symptoms of physical distress.   Average METs 2     Resistance Training   Training Prescription No   Weight 0     Recumbant Bike   Level 2   Minutes 10   METs 1.6     NuStep   Level 3   SPM 70   Minutes 10   METs 1.7     Track   Laps 10   Minutes 10   METs 2.74      Nutrition:  Target Goals: Understanding of nutrition guidelines, daily intake of sodium <1568m, cholesterol <2060m calories 30% from fat and 7% or less from saturated fats, daily to have 5 or more servings of fruits and vegetables.  Biometrics:     Pre Biometrics - 01/12/17 1242      Pre Biometrics   Waist Circumference 42 inches   Hip  Circumference 44 inches   Waist to Hip Ratio 0.95 %   Triceps Skinfold 21 mm   % Body Fat 32.5 %   Grip Strength 32 kg   Flexibility  10 in   Single Leg Stand 4.31 seconds       Nutrition Therapy Plan and Nutrition Goals:     Nutrition Therapy & Goals - 02/10/17 1211      Nutrition Therapy   Diet Carb Modified, Therapeutic Lifestyle Changes     Personal Nutrition Goals   Nutrition Goal Pt to maintain his wt given dx of dementia. If pt loses wt, wt loss of no more than 1-2 lb/week recommended.     Intervention Plan   Intervention Prescribe, educate and counsel regarding individualized specific dietary modifications aiming towards targeted core components such as weight, hypertension, lipid management, diabetes, heart failure and other comorbidities.   Expected Outcomes Short Term Goal: Understand basic principles of dietary content, such as calories, fat, sodium, cholesterol and nutrients.;Long Term Goal: Adherence to prescribed nutrition plan.      Nutrition Discharge: Nutrition Scores:     Nutrition Assessments - 02/10/17 1211      MEDFICTS Scores   Pre Score 45      Nutrition Goals Re-Evaluation:   Nutrition Goals Re-Evaluation:   Nutrition Goals Discharge (Final Nutrition Goals Re-Evaluation):   Psychosocial: Target Goals: Acknowledge presence or absence of significant depression and/or stress, maximize coping skills, provide positive support system. Participant is able to verbalize types and ability to use techniques and skills needed for reducing stress and depression.  Initial Review & Psychosocial Screening:     Initial Psych Review & Screening - 01/12/17 1539      Initial Review   Current issues with None Identified     Family Dynamics   Good Support System? Yes  Pt is accompanied by his son, [name] for this appt.     Barriers   Psychosocial barriers to participate in program There are no identifiable barriers or psychosocial needs.       Quality of Life Scores:     Quality of Life - 01/16/17 1113      Quality of Life Scores   Health/Function Pre 7.97 %   Socioeconomic Pre 11.21 %   Psych/Spiritual Pre 12.79 %   Family Pre 13.2 %   GLOBAL Pre 10.44 %      PHQ-9: Recent Review Flowsheet Data    Depression screen [facility] 2/9 01/16/2017   Decreased Interest 1   Down, Depressed, Hopeless 0   PHQ - 2 Score 1     Interpretation of Total Score  Total Score Depression Severity:  1-4 = Minimal depression, 5-9 = Mild depression, 10-14 = Moderate depression, 15-19 = Moderately severe depression, 20-27 = Severe depression   Psychosocial Evaluation and Intervention:     Psychosocial Evaluation - 02/26/17 2027      Psychosocial Evaluation & Interventions   Interventions Encouraged to exercise with the program and follow exercise prescription;Stress management education   Comments difficult to assesss at times due to pt dementia   Continue Psychosocial Services  Follow up required by staff      Psychosocial Re-Evaluation:     Psychosocial Re-Evaluation    [place] Name 01/31/17 0002 02/26/17 2027           Psychosocial Re-Evaluation   Comments Pt son cares for pt due to dementia. Pt son cares for pt due to dementia.      Interventions Stress management education;Relaxation education;Encouraged to attend Cardiac Rehabilitation for the exercise Stress management education;Relaxation education;Encouraged to attend Cardiac Rehabilitation for the exercise      Continue Psychosocial Services  Follow up required by staff  -  Psychosocial Discharge (Final Psychosocial Re-Evaluation):     Psychosocial Re-Evaluation - 02/26/17 2027      Psychosocial Re-Evaluation   Comments Pt son cares for pt due to dementia.   Interventions Stress management education;Relaxation education;Encouraged to attend Cardiac Rehabilitation for the exercise      Vocational Rehabilitation: Provide vocational rehab assistance to  qualifying candidates.   Vocational Rehab Evaluation & Intervention:   Education: Education Goals: Education classes will be provided on a weekly basis, covering required topics. Participant will state understanding/return demonstration of topics presented.  Learning Barriers/Preferences:     Learning Barriers/Preferences - 01/12/17 1237      Learning Barriers/Preferences   Learning Barriers Sight  memory deficit   Learning Preferences Verbal Instruction;Skilled Demonstration;Written Material      Education Topics: Count Your Pulse:  -Group instruction provided by verbal instruction, demonstration, patient participation and written materials to support subject.  Instructors address importance of being able to find your pulse and how to count your pulse when at home without a heart monitor.  Patients get hands on experience counting their pulse with staff help and individually.   CARDIAC REHAB PHASE II EXERCISE from 02/22/2017 in [place]  Date  02/17/17  Instruction Review Code  2- meets goals/outcomes      Heart Attack, Angina, and Risk Factor Modification:  -Group instruction provided by verbal instruction, video, and written materials to support subject.  Instructors address signs and symptoms of angina and heart attacks.    Also discuss risk factors for heart disease and how to make changes to improve heart health risk factors.   Functional Fitness:  -Group instruction provided by verbal instruction, demonstration, patient participation, and written materials to support subject.  Instructors address safety measures for doing things around the house.  Discuss how to get up and down off the floor, how to pick things up properly, how to safely get out of a chair without assistance, and balance training.   CARDIAC REHAB PHASE II EXERCISE from 02/22/2017 in [place]  Date  01/27/17  Instruction Review Code  1-  partially meets, needs review/practice      Meditation and Mindfulness:  -Group instruction provided by verbal instruction, patient participation, and written materials to support subject.  Instructor addresses importance of mindfulness and meditation practice to help reduce stress and improve awareness.  Instructor also leads participants through a meditation exercise.    Stretching for Flexibility and Mobility:  -Group instruction provided by verbal instruction, patient participation, and written materials to support subject.  Instructors lead participants through series of stretches that are designed to increase flexibility thus improving mobility.  These stretches are additional exercise for major muscle groups that are typically performed during regular warm up and cool down.   Hands Only CPR:  -Group verbal, video, and participation provides a basic overview of AHA guidelines for community CPR. Role-play of emergencies allow participants the opportunity to practice calling for help and chest compression technique with discussion of AED use.   Hypertension: -Group verbal and written instruction that provides a basic overview of hypertension including the most recent diagnostic guidelines, risk factor reduction with self-care instructions and medication management.   CARDIAC REHAB PHASE II EXERCISE from 02/22/2017 in [place]  Date  01/20/17  Instruction Review Code  2- meets goals/outcomes       Nutrition I class: Heart Healthy Eating:  -Group instruction provided by PowerPoint slides, verbal discussion,  and written materials to support subject matter. The instructor gives an explanation and review of the Therapeutic Lifestyle Changes diet recommendations, which includes a discussion on lipid goals, dietary fat, sodium, fiber, plant stanol/sterol esters, sugar, and the components of a well-balanced, healthy diet.   Nutrition II class: Lifestyle  Skills:  -Group instruction provided by PowerPoint slides, verbal discussion, and written materials to support subject matter. The instructor gives an explanation and review of label reading, grocery shopping for heart health, heart healthy recipe modifications, and ways to make healthier choices when eating out.   Diabetes Question & Answer:  -Group instruction provided by PowerPoint slides, verbal discussion, and written materials to support subject matter. The instructor gives an explanation and review of diabetes co-morbidities, pre- and post-prandial blood glucose goals, pre-exercise blood glucose goals, signs, symptoms, and treatment of hypoglycemia and hyperglycemia, and foot care basics.   Diabetes Blitz:  -Group instruction provided by PowerPoint slides, verbal discussion, and written materials to support subject matter. The instructor gives an explanation and review of the physiology behind type 1 and type 2 diabetes, diabetes medications and rational behind using different medications, pre- and post-prandial blood glucose recommendations and Hemoglobin A1c goals, diabetes diet, and exercise including blood glucose guidelines for exercising safely.    Portion Distortion:  -Group instruction provided by PowerPoint slides, verbal discussion, written materials, and food models to support subject matter. The instructor gives an explanation of serving size versus portion size, changes in portions sizes over the last 20 years, and what consists of a serving from each food group.   CARDIAC REHAB PHASE II EXERCISE from 02/22/2017 in [place]  Date  02/01/17  Educator  RD  Instruction Review Code  2- meets goals/outcomes      Stress Management:  -Group instruction provided by verbal instruction, video, and written materials to support subject matter.  Instructors review role of stress in heart disease and how to cope with stress positively.     CARDIAC REHAB  PHASE II EXERCISE from 02/22/2017 in [place]  Date  02/08/17  Instruction Review Code  2- meets goals/outcomes      Exercising on Your Own:  -Group instruction provided by verbal instruction, power point, and written materials to support subject.  Instructors discuss benefits of exercise, components of exercise, frequency and intensity of exercise, and end points for exercise.  Also discuss use of nitroglycerin and activating EMS.  Review options of places to exercise outside of rehab.  Review guidelines for sex with heart disease.   Cardiac Drugs I:  -Group instruction provided by verbal instruction and written materials to support subject.  Instructor reviews cardiac drug classes: antiplatelets, anticoagulants, beta blockers, and statins.  Instructor discusses reasons, side effects, and lifestyle considerations for each drug class.   Cardiac Drugs II:  -Group instruction provided by verbal instruction and written materials to support subject.  Instructor reviews cardiac drug classes: angiotensin converting enzyme inhibitors (ACE-I), angiotensin II receptor blockers (ARBs), nitrates, and calcium channel blockers.  Instructor discusses reasons, side effects, and lifestyle considerations for each drug class.   CARDIAC REHAB PHASE II EXERCISE from 02/22/2017 in [place]  Date  02/22/17  Instruction Review Code  2- meets goals/outcomes      Anatomy and Physiology of the Circulatory System:  Group verbal and written instruction and models provide basic cardiac anatomy and physiology, with the coronary electrical and arterial systems. Review of: AMI, Angina,  Valve disease, Heart Failure, Peripheral Artery Disease, Cardiac Arrhythmia, Pacemakers, and the ICD.   Other Education:  -Group or individual verbal, written, or video instructions that support the educational goals of the cardiac rehab program.   Knowledge Questionnaire  Score:     Knowledge Questionnaire Score - 01/16/17 1113      Knowledge Questionnaire Score   Pre Score 14/28      Core Components/Risk Factors/Patient Goals at Admission:     Personal Goals and Risk Factors at Admission - 01/12/17 1015      Core Components/Risk Factors/Patient Goals on Admission   Diabetes Yes   Intervention Provide education about signs/symptoms and action to take for hypo/hyperglycemia.;Provide education about proper nutrition, including hydration, and aerobic/resistive exercise prescription along with prescribed medications to achieve blood glucose in normal ranges: Fasting glucose 65-99 mg/dL   Expected Outcomes Short Term: Participant verbalizes understanding of the signs/symptoms and immediate care of hyper/hypoglycemia, proper foot care and importance of medication, aerobic/resistive exercise and nutrition plan for blood glucose control.;Long Term: Attainment of HbA1C < 7%.      Core Components/Risk Factors/Patient Goals Review:      Goals and Risk Factor Review    Row Name 01/31/17 0000 02/26/17 2026           Core Components/Risk Factors/Patient Goals Review   Personal Goals Review Weight Management/Obesity;Hypertension;Stress;Lipids Weight Management/Obesity;Hypertension;Stress;Lipids      Review Continue to encourage the attendance to cardiac education classess, reinforce often due to pt dementia. Continue to encourage the attendance to cardiac education classess, reinforce often due to pt dementia.      Expected Outcomes Pt will maintain healthy weigh, lipid values, htn and poistive and healthy coping skills. Pt will maintain healthy weigh, lipid values, htn and poistive and healthy coping skills.         Core Components/Risk Factors/Patient Goals at Discharge (Final Review):      Goals and Risk Factor Review - 02/26/17 2026      Core Components/Risk Factors/Patient Goals Review   Personal Goals Review Weight  Management/Obesity;Hypertension;Stress;Lipids   Review Continue to encourage the attendance to cardiac education classess, reinforce often due to pt dementia.   Expected Outcomes Pt will maintain healthy weigh, lipid values, htn and poistive and healthy coping skills.      ITP Comments:     ITP Comments    Row Name 01/12/17 1231           ITP Comments Medical Director, Dr. Fransico Him          Comments:  Ariana is making expected progress toward personal goals after completing 16 sessions. Psychosocial Assessment - Noted on his last office visit for pacer check that Physically, his son feels that he is doing much better. Unfortunately, his son also feels that his mental status is not back to where it was. He is more forgetful and is taking longer to answer complex questions. He seems to be getting stronger after his operation.  Continue to reinforce and encourage pt to repeat education classes to assist in reinforcing the information he has heard. Recommend continued exercise and life style modification education including  stress management and relaxation techniques to decrease cardiac risk profile. Cherre Huger, BSN Cardiac and Training and development officer

## 2017-02-27 ENCOUNTER — Encounter (HOSPITAL_COMMUNITY)
Admission: RE | Admit: 2017-02-27 | Discharge: 2017-02-27 | Disposition: A | Discharge status: Home / Self Care | Payer: Medicare Other | Source: Ambulatory Visit | Attending: Internal Medicine | Admitting: Internal Medicine

## 2017-02-27 DIAGNOSIS — Z952 Presence of prosthetic heart valve: Secondary | ICD-10-CM | POA: Diagnosis not present

## 2017-02-27 DIAGNOSIS — Z953 Presence of xenogenic heart valve: Secondary | ICD-10-CM | POA: Diagnosis not present

## 2017-02-27 DIAGNOSIS — Z48812 Encounter for surgical aftercare following surgery on the circulatory system: Secondary | ICD-10-CM | POA: Diagnosis not present

## 2017-03-01 ENCOUNTER — Encounter (HOSPITAL_COMMUNITY)
Admission: RE | Admit: 2017-03-01 | Discharge: 2017-03-01 | Disposition: A | Discharge status: Home / Self Care | Payer: Medicare Other | Source: Ambulatory Visit | Attending: Internal Medicine | Admitting: Internal Medicine

## 2017-03-01 DIAGNOSIS — Z952 Presence of prosthetic heart valve: Secondary | ICD-10-CM

## 2017-03-01 DIAGNOSIS — Z48812 Encounter for surgical aftercare following surgery on the circulatory system: Secondary | ICD-10-CM | POA: Diagnosis not present

## 2017-03-01 DIAGNOSIS — Z953 Presence of xenogenic heart valve: Secondary | ICD-10-CM | POA: Diagnosis not present

## 2017-03-03 ENCOUNTER — Encounter (HOSPITAL_COMMUNITY)
Admission: RE | Admit: 2017-03-03 | Discharge: 2017-03-03 | Disposition: A | Discharge status: Home / Self Care | Payer: Medicare Other | Source: Ambulatory Visit | Attending: Internal Medicine | Admitting: Internal Medicine

## 2017-03-03 DIAGNOSIS — Z952 Presence of prosthetic heart valve: Secondary | ICD-10-CM

## 2017-03-03 DIAGNOSIS — Z953 Presence of xenogenic heart valve: Secondary | ICD-10-CM | POA: Diagnosis not present

## 2017-03-03 DIAGNOSIS — Z48812 Encounter for surgical aftercare following surgery on the circulatory system: Secondary | ICD-10-CM | POA: Diagnosis not present

## 2017-03-06 ENCOUNTER — Encounter (HOSPITAL_COMMUNITY)
Admission: RE | Admit: 2017-03-06 | Discharge: 2017-03-06 | Disposition: A | Discharge status: Home / Self Care | Payer: Medicare Other | Source: Ambulatory Visit | Attending: Internal Medicine | Admitting: Internal Medicine

## 2017-03-06 DIAGNOSIS — Z953 Presence of xenogenic heart valve: Secondary | ICD-10-CM | POA: Diagnosis not present

## 2017-03-06 DIAGNOSIS — Z952 Presence of prosthetic heart valve: Secondary | ICD-10-CM

## 2017-03-06 DIAGNOSIS — Z48812 Encounter for surgical aftercare following surgery on the circulatory system: Secondary | ICD-10-CM | POA: Diagnosis not present

## 2017-03-07 ENCOUNTER — Ambulatory Visit (INDEPENDENT_AMBULATORY_CARE_PROVIDER_SITE_OTHER): Payer: Medicare Other | Admitting: Neurology

## 2017-03-07 ENCOUNTER — Encounter: Payer: Self-pay | Admitting: Neurology

## 2017-03-07 VITALS — BP 126/69 | HR 59 | Ht 67.0 in | Wt 205.0 lb

## 2017-03-07 DIAGNOSIS — Z952 Presence of prosthetic heart valve: Secondary | ICD-10-CM

## 2017-03-07 DIAGNOSIS — G451 Carotid artery syndrome (hemispheric): Secondary | ICD-10-CM | POA: Diagnosis not present

## 2017-03-07 DIAGNOSIS — I10 Essential (primary) hypertension: Secondary | ICD-10-CM | POA: Diagnosis not present

## 2017-03-07 DIAGNOSIS — I059 Rheumatic mitral valve disease, unspecified: Secondary | ICD-10-CM | POA: Diagnosis not present

## 2017-03-07 DIAGNOSIS — Z87891 Personal history of nicotine dependence: Secondary | ICD-10-CM | POA: Diagnosis not present

## 2017-03-07 DIAGNOSIS — E785 Hyperlipidemia, unspecified: Secondary | ICD-10-CM

## 2017-03-07 DIAGNOSIS — I2510 Atherosclerotic heart disease of native coronary artery without angina pectoris: Secondary | ICD-10-CM | POA: Diagnosis not present

## 2017-03-07 DIAGNOSIS — I058 Other rheumatic mitral valve diseases: Secondary | ICD-10-CM

## 2017-03-07 NOTE — Patient Instructions (Addendum)
-   continue ASA and zocor for stroke prevention - follow up with Dr. Curt Bears and Dr. Roxy Manns - Follow up with your primary care physician for stroke risk factor modification. Recommend maintain blood pressure goal <130/80, diabetes with hemoglobin A1c goal below 7.0% and lipids with LDL cholesterol goal below 70 mg/dL.  - check BP at home and record - continue abstain from smoking - healthy diet and regular exercise - encourage more physical activity and mental exercise at home.  - follow up in 6 months.

## 2017-03-07 NOTE — Progress Notes (Signed)
STROKE NEUROLOGY FOLLOW UP NOTE  NAME: James Curtis DOB: 08-Aug-1941  REASON FOR VISIT: stroke follow up HISTORY FROM: pt and son and chart  Today we had the pleasure of seeing James Curtis in follow-up at our [facility]. Pt was accompanied by son.   History Summary James Curtis is a 76 y.o. male with history of HTN, HLD, tobacco smoker and diet-controlled DB admitted on 11/18/16 for slurred speech, confusion and R facial droop. Symptoms resolved quickly. MRI no acute stroke. Stroke workup including MRA, carotid Doppler unremarkable. TTE showed EF 65-70% with mobile echodensity posterior mitral valve. TEE done again showed mitral wall mobile calcified structure with unclear etiology. LDL 60 and A1c 6.2. CVTS consulted and plan for resection. He was also found to have complete heart block confirmed on EKG, cardiology consulted and had a pacemaker placed. He was discharged with aspirin and simvastatin.   Readmitted on 12/02/16, had resection of mitral wall mass, pathology showed calcified febrile take nodule and mitral valve leaflets with extensive calcification. His admission was complicated by hematuria, urinary retention, urethral trauma due to Foley catheter. Eventually he was discharged to SNF.   Interval History During the interval time, the patient has been doing well. He is at home now. Followed with cardiology and Dr. Roxy Manns, doing well with cardiac function and pacemaker. Has quit smoking. Patient complains of dark stool but he is on iron pills. Family complains of patient lack of activity and social withdrawal. Patient refuses any medication for depression. BP today 126/69   REVIEW OF SYSTEMS: Full 14 system review of systems performed and notable only for those listed below and in HPI above, all others are negative:  Constitutional:   Cardiovascular:  Ear/Nose/Throat:   Skin:  Eyes:   loss of vision, blurry vision  Respiratory:   cough  Gastroitestinal:     Genitourinary:  frequency of urination  Hematology/Lymphatic:   Endocrine:  Musculoskeletal:   Allergy/Immunology:   Neurological:   memory loss  Psychiatric:  agitation and confusion  Sleep:   The following represents the patient's updated allergies and side effects list: No Known Allergies  The neurologically relevant items on the patient's problem list were reviewed on today's visit.  Neurologic Examination  A problem focused neurological exam (12 or more points of the single system neurologic examination, vital signs counts as 1 point, cranial nerves count for 8 points) was performed.  Blood pressure 126/69, pulse (!) 59, height 5\' 7"  (1.702 m), weight 205 lb (93 kg).  General - Well nourished, well developed, in no apparent distress.  Ophthalmologic - Fundi not visualized due to eye movement.  Cardiovascular - Regular rate and rhythm with no murmur.  Mental Status -  Level of arousal and orientation to time, place, and person were intact. Language including expression, naming, repetition, comprehension was assessed and found intact. Fund of Knowledge was assessed and was impaired.  Cranial Nerves II - XII - II - Visual field intact OU. III, IV, VI - Extraocular movements intact. V - Facial sensation intact bilaterally. VII - Facial movement intact bilaterally. VIII - Hearing & vestibular intact bilaterally. X - Palate elevates symmetrically. XI - Chin turning & shoulder shrug intact bilaterally. XII - Tongue protrusion intact.  Motor Strength - The patient's strength was normal in all extremities and pronator drift was absent.  Bulk was normal and fasciculations were absent.   Motor Tone - Muscle tone was assessed at the neck and appendages and was normal.  Reflexes -  The patient's reflexes were 1+ in all extremities and he had no pathological reflexes.  Sensory - Light touch, temperature/pinprick, vibration and proprioception, and Romberg testing were assessed  and were normal.    Coordination - The patient had normal movements in the hands and feet with no ataxia or dysmetria.  Tremor was absent.  Gait and Station - walk with cane, small stride and cautious gait   Functional score  mRS = 1   0 - No symptoms.   1 - No significant disability. Able to carry out all usual activities, despite some symptoms.   2 - Slight disability. Able to look after own affairs without assistance, but unable to carry out all previous activities.   3 - Moderate disability. Requires some help, but able to walk unassisted.   4 - Moderately severe disability. Unable to attend to own bodily needs without assistance, and unable to walk unassisted.   5 - Severe disability. Requires constant nursing care and attention, bedridden, incontinent.   6 - Dead.   NIH Stroke Scale = 0   Data reviewed: I personally reviewed the images and agree with the radiology interpretations.  Mr Curtis 8 Contrast [name] Head Wo Contrast 11/19/2016 1. No acute intracranial abnormality identified. 2. Stable mild chronic microvascular ischemic changes and mild parenchymal volume loss of the Curtis. Stable small chronic lacunar infarct in left hemi pons. 3. Patent circle of Willis. No high-grade stenosis, large vessel occlusion, or aneurysm identified.   2D Echocardiogram  Impressions:   Hyperdynamic LVEF. Moderate aortic stenosis. Moderate mitral stenosis. The mitral valve is severely thickenedand calcified with restricted leaflet opening. There are severemitral annular calcifications predominantly posterior.There is a mobile echodensity attached to the posterior mitralvalve leaflet/annulus from the left atrial side measuring 12 x 42mm. This might represent a vegetation vs detached calcifications.A TEE is recommended for further evaluation.  Carotid Doppler   There is 1-39% bilateral ICA stenosis. Vertebral artery flow is antegrade.    TEE Left ventricle: The cavity size  was normal. There was severe concentric hypertrophy. Systolic function was normal. The estimated ejection fraction was in the range of 60% to 65%. - Aortic valve: Cusp separation was reduced. Noncoronary cusp mobility was restricted. - Mitral valve: Mildly calcified annulus. Mildly thickened leaflets . There was a 16 x 4 mm mobile calcified echodensity on the atrial basal surface of the posterior mitral valve. Differential includes fibroelastoma or vegetation (checking blood cultures). Chronic vegetations are usually calcified as this mass currently demonstrates, however they are not as mobile as we are seeing. There was mild regurgitation. - Left atrium: No evidence of thrombus in the atrial cavity or appendage. No evidence of thrombus in the appendage. - Right atrium: Pacer wire or catheter noted in right atrium. No evidence of thrombus in the atrial cavity or appendage. - Tricuspid valve: No evidence of vegetation. - Pulmonic valve: No evidence of vegetation.  Component     Latest Ref Rng & Units 11/19/2016  Cholesterol     0 - 200 mg/dL 106  Triglycerides     <150 mg/dL 97  HDL Cholesterol     >40 mg/dL 27 (L)  Total CHOL/HDL Ratio     RATIO 3.9  VLDL     0 - 40 mg/dL 19  LDL (calc)     0 - 99 mg/dL 60  Hemoglobin A1C     4.8 - 5.6 % 6.2 (H)  Mean Plasma Glucose     mg/dL 131  Assessment: As you may recall, he is a 76 y.o. African American male with PMH of HTN, HLD, tobacco smoker and diet-controlled DB admitted on 11/18/16 for slurred speech, confusion and R facial droop. Symptoms resolved quickly. MRI no acute stroke. Stroke workup including MRA, carotid Doppler unremarkable. TTE showed EF 65-70% with mobile echodensity posterior mitral valve. TEE done again showed mitral wall mobile calcified structure with unclear etiology. LDL 60 and A1c 6.2. CVTS consulted and plan for resection. He was also found to have complete heart block confirmed on EKG,  cardiology consulted and had a pacemaker placed. He was discharged with aspirin and simvastatin. 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 Followed with cardiology and Dr. Roxy Manns, doing well with cardiac function and pacemaker. Has quit smoking. Patient refuses any medication for depression. Continued on ASA and zocor.   Plan:  - continue ASA and zocor for stroke prevention - follow up with Dr. Curt Bears and Dr. Roxy Manns - Follow up with your primary care physician for stroke risk factor modification. Recommend maintain blood pressure goal <130/80, diabetes with hemoglobin A1c goal below 7.0% and lipids with LDL cholesterol goal below 70 mg/dL.  - check BP at home and record - continue abstain from smoking - healthy diet and regular exercise - encourage more physical activity and mental exercise at home.  - follow up in 6 months.   No orders of the defined types were placed in this encounter.   No orders of the defined types were placed in this encounter.   Patient Instructions  - continue ASA and zocor for stroke prevention - follow up with Dr. Curt Bears and Dr. Roxy Manns - Follow up with your primary care physician for stroke risk factor modification. Recommend maintain blood pressure goal <130/80, diabetes with hemoglobin A1c goal below 7.0% and lipids with LDL cholesterol goal below 70 mg/dL.  - check BP at home and record - continue abstain from smoking - healthy diet and regular exercise - encourage more physical activity and mental exercise at home.  - follow up in 6 months.    Rosalin Hawking, MD PhD [facility] [address] [place], [place] [address] [phone]

## 2017-03-08 ENCOUNTER — Encounter (HOSPITAL_COMMUNITY)
Admission: RE | Admit: 2017-03-08 | Discharge: 2017-03-08 | Disposition: A | Discharge status: Home / Self Care | Payer: Medicare Other | Source: Ambulatory Visit | Attending: Internal Medicine | Admitting: Internal Medicine

## 2017-03-08 DIAGNOSIS — Z952 Presence of prosthetic heart valve: Secondary | ICD-10-CM

## 2017-03-08 DIAGNOSIS — Z48812 Encounter for surgical aftercare following surgery on the circulatory system: Secondary | ICD-10-CM | POA: Diagnosis not present

## 2017-03-08 DIAGNOSIS — Z953 Presence of xenogenic heart valve: Secondary | ICD-10-CM | POA: Diagnosis not present

## 2017-03-10 ENCOUNTER — Encounter (HOSPITAL_COMMUNITY): Payer: Medicare Other

## 2017-03-13 ENCOUNTER — Encounter (HOSPITAL_COMMUNITY)
Admission: RE | Admit: 2017-03-13 | Discharge: 2017-03-13 | Disposition: A | Discharge status: Home / Self Care | Payer: Medicare Other | Source: Ambulatory Visit | Attending: Internal Medicine | Admitting: Internal Medicine

## 2017-03-13 DIAGNOSIS — Z952 Presence of prosthetic heart valve: Secondary | ICD-10-CM | POA: Diagnosis not present

## 2017-03-13 DIAGNOSIS — Z48812 Encounter for surgical aftercare following surgery on the circulatory system: Secondary | ICD-10-CM | POA: Diagnosis not present

## 2017-03-13 DIAGNOSIS — Z953 Presence of xenogenic heart valve: Secondary | ICD-10-CM | POA: Diagnosis not present

## 2017-03-15 ENCOUNTER — Encounter (HOSPITAL_COMMUNITY)
Admission: RE | Admit: 2017-03-15 | Discharge: 2017-03-15 | Disposition: A | Discharge status: Home / Self Care | Payer: Medicare Other | Source: Ambulatory Visit | Attending: Internal Medicine | Admitting: Internal Medicine

## 2017-03-15 DIAGNOSIS — Z48812 Encounter for surgical aftercare following surgery on the circulatory system: Secondary | ICD-10-CM | POA: Insufficient documentation

## 2017-03-15 DIAGNOSIS — Z953 Presence of xenogenic heart valve: Secondary | ICD-10-CM | POA: Diagnosis not present

## 2017-03-15 DIAGNOSIS — Z952 Presence of prosthetic heart valve: Secondary | ICD-10-CM | POA: Diagnosis not present

## 2017-03-17 ENCOUNTER — Encounter (HOSPITAL_COMMUNITY)
Admission: RE | Admit: 2017-03-17 | Discharge: 2017-03-17 | Disposition: A | Discharge status: Home / Self Care | Payer: Medicare Other | Source: Ambulatory Visit | Attending: Internal Medicine | Admitting: Internal Medicine

## 2017-03-17 ENCOUNTER — Ambulatory Visit: Payer: Medicare Other | Admitting: Physician Assistant

## 2017-03-17 DIAGNOSIS — Z953 Presence of xenogenic heart valve: Secondary | ICD-10-CM | POA: Diagnosis not present

## 2017-03-17 DIAGNOSIS — Z952 Presence of prosthetic heart valve: Secondary | ICD-10-CM

## 2017-03-17 DIAGNOSIS — Z48812 Encounter for surgical aftercare following surgery on the circulatory system: Secondary | ICD-10-CM | POA: Diagnosis not present

## 2017-03-20 ENCOUNTER — Encounter (HOSPITAL_COMMUNITY)
Admission: RE | Admit: 2017-03-20 | Discharge: 2017-03-20 | Disposition: A | Discharge status: Home / Self Care | Payer: Medicare Other | Source: Ambulatory Visit | Attending: Internal Medicine | Admitting: Internal Medicine

## 2017-03-20 DIAGNOSIS — Z952 Presence of prosthetic heart valve: Secondary | ICD-10-CM

## 2017-03-22 ENCOUNTER — Encounter (HOSPITAL_COMMUNITY)
Admission: RE | Admit: 2017-03-22 | Discharge: 2017-03-22 | Disposition: A | Discharge status: Home / Self Care | Payer: Medicare Other | Source: Ambulatory Visit | Attending: Internal Medicine | Admitting: Internal Medicine

## 2017-03-22 DIAGNOSIS — Z952 Presence of prosthetic heart valve: Secondary | ICD-10-CM

## 2017-03-22 DIAGNOSIS — Z48812 Encounter for surgical aftercare following surgery on the circulatory system: Secondary | ICD-10-CM | POA: Diagnosis not present

## 2017-03-22 DIAGNOSIS — Z953 Presence of xenogenic heart valve: Secondary | ICD-10-CM | POA: Diagnosis not present

## 2017-03-24 ENCOUNTER — Encounter (HOSPITAL_COMMUNITY)
Admission: RE | Admit: 2017-03-24 | Discharge: 2017-03-24 | Disposition: A | Discharge status: Home / Self Care | Payer: Medicare Other | Source: Ambulatory Visit | Attending: Internal Medicine | Admitting: Internal Medicine

## 2017-03-24 DIAGNOSIS — Z953 Presence of xenogenic heart valve: Secondary | ICD-10-CM | POA: Diagnosis not present

## 2017-03-24 DIAGNOSIS — Z48812 Encounter for surgical aftercare following surgery on the circulatory system: Secondary | ICD-10-CM | POA: Diagnosis not present

## 2017-03-24 DIAGNOSIS — Z952 Presence of prosthetic heart valve: Secondary | ICD-10-CM

## 2017-03-27 ENCOUNTER — Encounter (HOSPITAL_COMMUNITY)
Admission: RE | Admit: 2017-03-27 | Discharge: 2017-03-27 | Disposition: A | Discharge status: Home / Self Care | Payer: Medicare Other | Source: Ambulatory Visit | Attending: Internal Medicine | Admitting: Internal Medicine

## 2017-03-27 DIAGNOSIS — Z48812 Encounter for surgical aftercare following surgery on the circulatory system: Secondary | ICD-10-CM | POA: Diagnosis not present

## 2017-03-27 DIAGNOSIS — Z952 Presence of prosthetic heart valve: Secondary | ICD-10-CM | POA: Diagnosis not present

## 2017-03-27 DIAGNOSIS — Z953 Presence of xenogenic heart valve: Secondary | ICD-10-CM | POA: Diagnosis not present

## 2017-03-27 NOTE — Progress Notes (Signed)
Cardiac Individual Treatment Plan  Patient Details  Name: James Curtis MRN: [id number] Date of Birth: July 06, 1941 Referring Provider:     [place] from 01/12/2017 in [place]  Referring Provider  End, [name]  MD      Initial Encounter Date:    CARDIAC REHAB PHASE II ORIENTATION from 01/12/2017 in [place]  Date  01/12/17  Referring Provider  End, [name]  MD      Visit Diagnosis: 12/02/16 S/P AVR (aortic valve replacement)  Patient's Home Medications on Admission:  Current Outpatient Prescriptions:  .  amLODipine (NORVASC) 10 MG tablet, Take 10 mg by mouth daily., Disp: , Rfl: 11 .  Apoaequorin (PREVAGEN PO), Take 1 tablet by mouth daily. For Brain and Memory Health - OTC, Disp: , Rfl:  .  aspirin EC 325 MG tablet, Take 1 tablet (325 mg total) by mouth daily., Disp: , Rfl:  .  carvedilol (COREG) 6.25 MG tablet, Take 1 tablet (6.25 mg total) by mouth 2 (two) times daily., Disp: 60 tablet, Rfl: 6 .  cholecalciferol (VITAMIN D) 1000 units tablet, Take 1,000 Units by mouth daily., Disp: , Rfl:  .  iron polysaccharides (NIFEREX) 150 MG capsule, Take 1 capsule (150 mg total) by mouth daily., Disp: , Rfl:  .  losartan (COZAAR) 100 MG tablet, Take 1 tablet (100 mg total) by mouth daily., Disp: 90 tablet, Rfl: 3 .  simvastatin (ZOCOR) 20 MG tablet, Take 20 mg by mouth at bedtime. , Disp: , Rfl:  .  Tetrahydrozoline HCl (VISINE OP), Apply to eye as directed., Disp: , Rfl:   Past Medical History: Past Medical History:  Diagnosis Date  . Aortic stenosis    a. 11/20/2016 Echo: mod AS;  b. 11/23/2016 TEE restricted mobility of noncoronary cusp;  c. 11/2016 Cath: mean grad 49mHg w/ dual lumen catheter, AVA 2.0cm^2; mean gradient by pullback = 131mg, AVA 2.3cm^2.  . [place]omy[place] ([facility] [place]   Echo 6/18: EF 40-45, inf/inf-septal HK-AK, Gr 1 DD, severe LVH, MAC, severe LAE, mildly reduced RVSF  .  [place] arterial [place] ([place]   a. 11/2016 Carotid U/S: bilat 1-39% ICA stenosis.  . Chronic kidney disease   . [place] heart block ([facility]   a. 11/21/2016 s/p MDT AzSantina EvansR MRI SureScan (ser # [id number]).  . Dementia    mild  . Family history of adverse reaction to anesthesia    Son [name] a difficult time waking up  . Heart murmur   . Hypercholesteremia   . Hypertension    benign  . Incidental pulmonary nodule, greater than or equal to 24m56m needs f/u scan 3 months 11/28/2016   14 x 8 mm (mean diameter of 11 mm) left lower lobe pulmonary nodule with some surrounding ground-glass attenuation (axial image 42 of series 6). 9 x 5 mm (mean diameter of 7 mm) subpleural nodule in the periphery of the right upper lobe (axial image 20 of series 6). A few patchy areas of peripheral predominant ground-glass attenuation and subpleural reticulation are noted, scattered randomly throu  . Left atrial mass   . 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 11/2009 TEE: EF 60-65%, restricted mobility of noncoronary AoV cusp, 16x4 mm mobile Ca2+ echodensity on the atrial basal surface of the posterior MV leaflet, mild MR, no LA/LAA thrombus.  . Non-obstructive CAD (coronary artery disease)    a. 11/2016 Cath: LM large, nl, LAD min  irregs, LCX large, min irregs, OM3 40, RCA large, 25m   . Pre-diabetes   . Prostate cancer ([facility]    s/p radiation  . Renal mass of unknown nature - needs f/u MRI 11/28/2016   Multiple renal lesions are noted in the kidneys bilaterally. Although several of these lesions are low-attenuation, compatible with cysts, other lesions are intermediate to high attenuation, potentially enhancing  . S/P aortic valve replacement with bioprosthetic valve 12/02/2016   23 mm Edwards Intuity rapid deployment bovine pericardial tissue valve  . S/P resection of left atrial mass 12/02/2016   Benign dystrophic fibrotic tissue with calcification attached to posterior mitral annulus by thin  fibrotic stalk  . TIA (transient ischemic attack)    a. 11/2016 in setting of complete heart block.    Tobacco Use: History  Smoking Status  . Former Smoker  . Packs/day: 0.25  . Types: Cigarettes  . Quit date: 11/16/2016  Smokeless Tobacco  . Never Used    Labs: Recent Review Flowsheet Data    Labs for ITP Cardiac and Pulmonary Rehab Latest Ref Rng & Units 12/02/2016 12/02/2016 12/02/2016 12/02/2016 12/03/2016   Cholestrol 0 - 200 mg/dL - - - - -   LDLCALC 0 - 99 mg/dL - - - - -   HDL >40 mg/dL - - - - -   Trlycerides <150 mg/dL - - - - -   Hemoglobin A1c 4.8 - 5.6 % - - - - -   PHART 7.350 - 7.450 7.320(L) - 7.292(L) 7.313(L) -   PCO2ART 32.0 - 48.0 mmHg 45.0 - 44.5 46.2 -   HCO3 20.0 - 28.0 mmol/L 23.5 - 21.5 23.5 -   TCO2 0 - 100 mmol/L 25 23 23 25 24    ACIDBASEDEF 0.0 - 2.0 mmol/L 3.0(H) - 5.0(H) 3.0(H) -   O2SAT % 99.0 - 91.0 91.0 -      Capillary Blood Glucose: Lab Results  Component Value Date   GLUCAP 128 (H) 01/20/2017   GLUCAP 89 01/20/2017   GLUCAP 156 (H) 01/16/2017   GLUCAP 75 12/05/2016   GLUCAP 66 12/05/2016     Exercise Target Goals:    Exercise Program Goal: Individual exercise prescription set with THRR, safety & activity barriers. Participant demonstrates ability to understand and report RPE using BORG scale, to self-measure pulse accurately, and to acknowledge the importance of the exercise prescription.  Exercise Prescription Goal: Starting with aerobic activity 30 plus minutes a day, 3 days per week for initial exercise prescription. Provide home exercise prescription and guidelines that participant acknowledges understanding prior to discharge.  Activity Barriers & Risk Stratification:   6 Minute Walk:     6 Minute Walk    Row Name 01/12/17 1238         6 Minute Walk   Phase Initial     Distance 1564 feet     Walk Time 6 minutes     # of Rest Breaks 0     MPH 2.96     METS 3.02     RPE 13     VO2 Peak 10.58     Symptoms Yes  (comment)     Comments fatigue at end of test     Resting HR 77 bpm     Resting BP 130/72     Max Ex. HR 109 bpm     Max Ex. BP 162/68     2 Minute Post BP 128/72        Oxygen Initial Assessment:  Oxygen Re-Evaluation:   Oxygen Discharge (Final Oxygen Re-Evaluation):   Initial Exercise Prescription:     Initial Exercise Prescription - 01/12/17 1300      Date of Initial Exercise RX and Referring Provider   Date 01/12/17   Referring Provider End, [name]  MD     Recumbant Bike   Level 2.5   Minutes 10   METs 2.3     NuStep   Level 3   SPM 70   Minutes 10   METs 2.2     Track   Laps 10   Minutes 10   METs 2.74     Prescription Details   Frequency (times per week) 3   Duration Progress to 30 minutes of continuous aerobic without signs/symptoms of physical distress     Intensity   THRR 40-80% of Max Heartrate 58-115   Ratings of Perceived Exertion 11-13   Perceived Dyspnea 0-4     Progression   Progression Continue to progress workloads to maintain intensity without signs/symptoms of physical distress.     Resistance Training   Training Prescription No   Weight 0      Perform Capillary Blood Glucose checks as needed.  Exercise Prescription Changes:      Exercise Prescription Changes    Row Name 01/19/17 1200 02/10/17 1418 02/27/17 1400 03/13/17 1632 03/28/17 1600     Response to Exercise   Blood Pressure (Admit) 120/72 138/70 122/64 140/60 136/70   Blood Pressure (Exercise) 122/70 128/70 142/72 142/68 150/80   Blood Pressure (Exit) 112/62 132/72 118/76 118/60 102/70   Heart Rate (Admit) 64 bpm 76 bpm 59 bpm 65 bpm 73 bpm   Heart Rate (Exercise) 87 bpm 123 bpm 92 bpm 95 bpm 86 bpm   Heart Rate (Exit) 64 bpm 76 bpm 58 bpm 65 bpm 65 bpm   Rating of Perceived Exertion (Exercise) 12 11 11 11 13    Symptoms none none none none none   Comments pt was oriented to exercise equipment  - -  -  -   Duration Continue with 30 min of aerobic exercise  without signs/symptoms of physical distress. Continue with 30 min of aerobic exercise without signs/symptoms of physical distress. Continue with 30 min of aerobic exercise without signs/symptoms of physical distress. Continue with 30 min of aerobic exercise without signs/symptoms of physical distress. Continue with 30 min of aerobic exercise without signs/symptoms of physical distress.   Intensity THRR unchanged THRR unchanged THRR unchanged THRR unchanged THRR unchanged     Progression   Progression Continue to progress workloads to maintain intensity without signs/symptoms of physical distress. Continue to progress workloads to maintain intensity without signs/symptoms of physical distress. Continue to progress workloads to maintain intensity without signs/symptoms of physical distress. Continue to progress workloads to maintain intensity without signs/symptoms of physical distress. Continue to progress workloads to maintain intensity without signs/symptoms of physical distress.   Average METs 2 2.2 2.2 2.4 2.7     Resistance Training   Training Prescription No Yes Yes Yes Yes   Weight 0 2lbs 2lbs 3lbs 3lbs   Reps  - 10-15 10-15 10-15 10-15   Time  - 10 Minutes 10 Minutes 10 Minutes 10 Minutes     Recumbant Bike   Level 2 3 3 4 4    Minutes 10 10 10 10 10    METs 1.6 1.9 1.9 2.1 3.1     NuStep   Level 3 3 4 5 5    SPM 70 80 80 80 90  Minutes 10 10 10 10 10    METs 1.7 2.1 1.9 2.1 2.4     Track   Laps 10 9 10 10 10    Minutes 10 10 10 10 10    METs 2.74 2.57 2.74 2.74 2.74     Home Exercise Plan   Plans to continue exercise at  [facility] (comment)  walking at home and at [place] (comment)  walking at home and in grocery stores with son [employer] (comment)  walking at home and in grocery store with son [employer] (comment)  walking at home and in grocery stores with son   Frequency  - Add 2 additional days to program exercise  sessions. Add 2 additional days to program exercise sessions. Add 2 additional days to program exercise sessions. Add 2 additional days to program exercise sessions.   Initial Home Exercises Provided  - 02/01/17 02/01/17 02/01/17 02/01/17      Exercise Comments:      Exercise Comments    Row Name 02/27/17 1425 03/27/17 1644         Exercise Comments Reviewed METs and goals. Pt is tolerating exercise fairly well; will continue to monitor exercise progression. Reviewed METs and goals. Pt is tolerating exercise fairly well; will continue to monitor exercise progression.         Exercise Goals and Review:      Exercise Goals    Row Name 01/12/17 1323             Exercise Goals   Increase Physical Activity Yes       Intervention Provide advice, education, support and counseling about physical activity/exercise needs.;Develop an individualized exercise prescription for aerobic and resistive training based on initial evaluation findings, risk stratification, comorbidities and participant's personal goals.       Expected Outcomes Achievement of increased cardiorespiratory fitness and enhanced flexibility, muscular endurance and strength shown through measurements of functional capacity and personal statement of participant.       Increase Strength and Stamina Yes  Return to normal activities       Intervention Provide advice, education, support and counseling about physical activity/exercise needs.;Develop an individualized exercise prescription for aerobic and resistive training based on initial evaluation findings, risk stratification, comorbidities and participant's personal goals.       Expected Outcomes Achievement of increased cardiorespiratory fitness and enhanced flexibility, muscular endurance and strength shown through measurements of functional capacity and personal statement of participant.          Exercise Goals Re-Evaluation :     Exercise Goals Re-Evaluation    Row  Name 01/30/17 1650 01/30/17 1651 02/01/17 1212 02/27/17 1426 03/27/17 1642     Exercise Goal Re-Evaluation   Exercise Goals Review Increase Physical Activity;Increase Strenth and Stamina  - Increase Strenth and Stamina;Increase Physical Activity Increase Physical Activity;Increase Strenth and Stamina Increase Physical Activity;Increase Strenth and Stamina   Comments  - Pt is tolerating exercise really well and is able to exercise for 30 minutes without difficulty. Reviewed home exercise with pt today.  Pt plans to walk at local shopping center or with son around neighborhood for exercise, 2x/week in addition to coming to cardiac rehab. Reviewed exercise plan with son for understanding. Reviewed THR, pulse, RPE, sign and symptoms, and when to call 911 or MD.  Also discussed weather considerations and indoor options.  Pt and son voiced understanding. Pt is able to exercise for 30 minutes of aerobic activity per session. Pt tolerates exercise  very well without signs of physical distress, CP or unusual SOB. Pt is walking at home for exercise 1x/week. Encouraged an additional day of activity for a total of 5x/week of aerobic activity, in addition to coming to cardiac rehab.    Expected Outcomes  - Pt will continue to improve in cardiorespiratory fitness Pt will be compliant with home exercise and improve in cardiorespiratory fitness. Pt will be compliant with home exercise and improve in cardiorespiratory fitness. Pt will be compliant with home exercise and improve in cardiorespiratory fitness.       Discharge Exercise Prescription (Final Exercise Prescription Changes):     Exercise Prescription Changes - 03/28/17 1600      Response to Exercise   Blood Pressure (Admit) 136/70   Blood Pressure (Exercise) 150/80   Blood Pressure (Exit) 102/70   Heart Rate (Admit) 73 bpm   Heart Rate (Exercise) 86 bpm   Heart Rate (Exit) 65 bpm   Rating of Perceived Exertion (Exercise) 13   Symptoms none   Duration  Continue with 30 min of aerobic exercise without signs/symptoms of physical distress.   Intensity THRR unchanged     Progression   Progression Continue to progress workloads to maintain intensity without signs/symptoms of physical distress.   Average METs 2.7     Resistance Training   Training Prescription Yes   Weight 3lbs   Reps 10-15   Time 10 Minutes     Recumbant Bike   Level 4   Minutes 10   METs 3.1     NuStep   Level 5   SPM 90   Minutes 10   METs 2.4     Track   Laps 10   Minutes 10   METs 2.74     Home Exercise Plan   Plans to continue exercise at [employer] (comment)  walking at home and in grocery stores with son   Frequency Add 2 additional days to program exercise sessions.   Initial Home Exercises Provided 02/01/17      Nutrition:  Target Goals: Understanding of nutrition guidelines, daily intake of sodium <1523m, cholesterol <2040m calories 30% from fat and 7% or less from saturated fats, daily to have 5 or more servings of fruits and vegetables.  Biometrics:     Pre Biometrics - 01/12/17 1242      Pre Biometrics   Waist Circumference 42 inches   Hip Circumference 44 inches   Waist to Hip Ratio 0.95 %   Triceps Skinfold 21 mm   % Body Fat 32.5 %   Grip Strength 32 kg   Flexibility 10 in   Single Leg Stand 4.31 seconds       Nutrition Therapy Plan and Nutrition Goals:     Nutrition Therapy & Goals - 02/10/17 1211      Nutrition Therapy   Diet Carb Modified, Therapeutic Lifestyle Changes     Personal Nutrition Goals   Nutrition Goal Pt to maintain his wt given dx of dementia. If pt loses wt, wt loss of no more than 1-2 lb/week recommended.     Intervention Plan   Intervention Prescribe, educate and counsel regarding individualized specific dietary modifications aiming towards targeted core components such as weight, hypertension, lipid management, diabetes, heart failure and other comorbidities.   Expected Outcomes Short  Term Goal: Understand basic principles of dietary content, such as calories, fat, sodium, cholesterol and nutrients.;Long Term Goal: Adherence to prescribed nutrition plan.      Nutrition Discharge: Nutrition Scores:  Nutrition Assessments - 02/10/17 1211      MEDFICTS Scores   Pre Score 45      Nutrition Goals Re-Evaluation:   Nutrition Goals Re-Evaluation:   Nutrition Goals Discharge (Final Nutrition Goals Re-Evaluation):   Psychosocial: Target Goals: Acknowledge presence or absence of significant depression and/or stress, maximize coping skills, provide positive support system. Participant is able to verbalize types and ability to use techniques and skills needed for reducing stress and depression.  Initial Review & Psychosocial Screening:     Initial Psych Review & Screening - 01/12/17 1539      Initial Review   Current issues with None Identified     Family Dynamics   Good Support System? Yes  Pt is accompanied by his son, [name] for this appt.     Barriers   Psychosocial barriers to participate in program There are no identifiable barriers or psychosocial needs.      Quality of Life Scores:     Quality of Life - 01/16/17 1113      Quality of Life Scores   Health/Function Pre 7.97 %   Socioeconomic Pre 11.21 %   Psych/Spiritual Pre 12.79 %   Family Pre 13.2 %   GLOBAL Pre 10.44 %      PHQ-9: Recent Review Flowsheet Data    Depression screen PHQ 2/9 01/16/2017   Decreased Interest 1   Down, Depressed, Hopeless 0   PHQ - 2 Score 1     Interpretation of Total Score  Total Score Depression Severity:  1-4 = Minimal depression, 5-9 = Mild depression, 10-14 = Moderate depression, 15-19 = Moderately severe depression, 20-27 = Severe depression   Psychosocial Evaluation and Intervention:     Psychosocial Evaluation - 03/27/17 1329      Psychosocial Evaluation & Interventions   Interventions Encouraged to exercise with the program and follow  exercise prescription;Stress management education   Comments difficult to assesss at times due to pt dementia   Continue Psychosocial Services  Follow up required by staff      Psychosocial Re-Evaluation:     Psychosocial Re-Evaluation    [place] Name 01/31/17 0002 02/26/17 2027           Psychosocial Re-Evaluation   Comments Pt son cares for pt due to dementia. Pt son cares for pt due to dementia.      Interventions Stress management education;Relaxation education;Encouraged to attend Cardiac Rehabilitation for the exercise Stress management education;Relaxation education;Encouraged to attend Cardiac Rehabilitation for the exercise      Continue Psychosocial Services  Follow up required by staff  -         Psychosocial Discharge (Final Psychosocial Re-Evaluation):     Psychosocial Re-Evaluation - 02/26/17 2027      Psychosocial Re-Evaluation   Comments Pt son cares for pt due to dementia.   Interventions Stress management education;Relaxation education;Encouraged to attend Cardiac Rehabilitation for the exercise      Vocational Rehabilitation: Provide vocational rehab assistance to qualifying candidates.   Vocational Rehab Evaluation & Intervention:   Education: Education Goals: Education classes will be provided on a weekly basis, covering required topics. Participant will state understanding/return demonstration of topics presented.  Learning Barriers/Preferences:     Learning Barriers/Preferences - 01/12/17 1237      Learning Barriers/Preferences   Learning Barriers Sight  memory deficit   Learning Preferences Verbal Instruction;Skilled Demonstration;Written Material      Education Topics: Count Your Pulse:  -Group instruction provided by verbal instruction, demonstration, patient participation and  written materials to support subject.  Instructors address importance of being able to find your pulse and how to count your pulse when at home without a heart  monitor.  Patients get hands on experience counting their pulse with staff help and individually.   CARDIAC REHAB PHASE II EXERCISE from 03/24/2017 in [employer]  Date  02/17/17  Instruction Review Code  2- meets goals/outcomes      Heart Attack, Angina, and Risk Factor Modification:  -Group instruction provided by verbal instruction, video, and written materials to support subject.  Instructors address signs and symptoms of angina and heart attacks.    Also discuss risk factors for heart disease and how to make changes to improve heart health risk factors.   CARDIAC REHAB PHASE II EXERCISE from 03/24/2017 in [place]  Date  03/08/17  Instruction Review Code  2- meets goals/outcomes      Functional Fitness:  -Group instruction provided by verbal instruction, demonstration, patient participation, and written materials to support subject.  Instructors address safety measures for doing things around the house.  Discuss how to get up and down off the floor, how to pick things up properly, how to safely get out of a chair without assistance, and balance training.   CARDIAC REHAB PHASE II EXERCISE from 03/24/2017 in [place]  Date  03/03/17  Instruction Review Code  1- partially meets, needs review/practice      Meditation and Mindfulness:  -Group instruction provided by verbal instruction, patient participation, and written materials to support subject.  Instructor addresses importance of mindfulness and meditation practice to help reduce stress and improve awareness.  Instructor also leads participants through a meditation exercise.    Stretching for Flexibility and Mobility:  -Group instruction provided by verbal instruction, patient participation, and written materials to support subject.  Instructors lead participants through series of stretches that are designed to increase flexibility thus  improving mobility.  These stretches are additional exercise for major muscle groups that are typically performed during regular warm up and cool down.   Hands Only CPR:  -Group verbal, video, and participation provides a basic overview of AHA guidelines for community CPR. Role-play of emergencies allow participants the opportunity to practice calling for help and chest compression technique with discussion of AED use.   Hypertension: -Group verbal and written instruction that provides a basic overview of hypertension including the most recent diagnostic guidelines, risk factor reduction with self-care instructions and medication management.   CARDIAC REHAB PHASE II EXERCISE from 03/24/2017 in [place]  Date  03/24/17  Instruction Review Code  2- meets goals/outcomes       Nutrition I class: Heart Healthy Eating:  -Group instruction provided by PowerPoint slides, verbal discussion, and written materials to support subject matter. The instructor gives an explanation and review of the Therapeutic Lifestyle Changes diet recommendations, which includes a discussion on lipid goals, dietary fat, sodium, fiber, plant stanol/sterol esters, sugar, and the components of a well-balanced, healthy diet.   Nutrition II class: Lifestyle Skills:  -Group instruction provided by PowerPoint slides, verbal discussion, and written materials to support subject matter. The instructor gives an explanation and review of label reading, grocery shopping for heart health, heart healthy recipe modifications, and ways to make healthier choices when eating out.   Diabetes Question & Answer:  -Group instruction provided by PowerPoint slides, verbal discussion, and written materials to support subject matter.  The instructor gives an explanation and review of diabetes co-morbidities, pre- and post-prandial blood glucose goals, pre-exercise blood glucose goals, signs, symptoms, and treatment  of hypoglycemia and hyperglycemia, and foot care basics.   CARDIAC REHAB PHASE II EXERCISE from 03/24/2017 in [place]  Date  03/17/17  Educator  RD  Instruction Review Code  2- meets goals/outcomes      Diabetes Blitz:  -Group instruction provided by PowerPoint slides, verbal discussion, and written materials to support subject matter. The instructor gives an explanation and review of the physiology behind type 1 and type 2 diabetes, diabetes medications and rational behind using different medications, pre- and post-prandial blood glucose recommendations and Hemoglobin A1c goals, diabetes diet, and exercise including blood glucose guidelines for exercising safely.    Portion Distortion:  -Group instruction provided by PowerPoint slides, verbal discussion, written materials, and food models to support subject matter. The instructor gives an explanation of serving size versus portion size, changes in portions sizes over the last 20 years, and what consists of a serving from each food group.   CARDIAC REHAB PHASE II EXERCISE from 03/24/2017 in [place]  Date  02/01/17  Educator  RD  Instruction Review Code  2- meets goals/outcomes      Stress Management:  -Group instruction provided by verbal instruction, video, and written materials to support subject matter.  Instructors review role of stress in heart disease and how to cope with stress positively.     CARDIAC REHAB PHASE II EXERCISE from 03/24/2017 in [place]  Date  02/08/17  Instruction Review Code  2- meets goals/outcomes      Exercising on Your Own:  -Group instruction provided by verbal instruction, power point, and written materials to support subject.  Instructors discuss benefits of exercise, components of exercise, frequency and intensity of exercise, and end points for exercise.  Also discuss use of nitroglycerin and activating  EMS.  Review options of places to exercise outside of rehab.  Review guidelines for sex with heart disease.   CARDIAC REHAB PHASE II EXERCISE from 03/24/2017 in [place]  Date  03/01/17  Instruction Review Code  2- meets goals/outcomes      Cardiac Drugs I:  -Group instruction provided by verbal instruction and written materials to support subject.  Instructor reviews cardiac drug classes: antiplatelets, anticoagulants, beta blockers, and statins.  Instructor discusses reasons, side effects, and lifestyle considerations for each drug class.   CARDIAC REHAB PHASE II EXERCISE from 03/24/2017 in [place]  Date  03/22/17  Instruction Review Code  2- meets goals/outcomes      Cardiac Drugs II:  -Group instruction provided by verbal instruction and written materials to support subject.  Instructor reviews cardiac drug classes: angiotensin converting enzyme inhibitors (ACE-I), angiotensin II receptor blockers (ARBs), nitrates, and calcium channel blockers.  Instructor discusses reasons, side effects, and lifestyle considerations for each drug class.   CARDIAC REHAB PHASE II EXERCISE from 03/24/2017 in [place]  Date  02/22/17  Instruction Review Code  2- meets goals/outcomes      Anatomy and Physiology of the Circulatory System:  Group verbal and written instruction and models provide basic cardiac anatomy and physiology, with the coronary electrical and arterial systems. Review of: AMI, Angina, Valve disease, Heart Failure, Peripheral Artery Disease, Cardiac Arrhythmia, Pacemakers, and the ICD.   CARDIAC REHAB PHASE II EXERCISE  from 03/24/2017 in [place]  Date  03/15/17  Educator  RN  Instruction Review Code  2- meets goals/outcomes      Other Education:  -Group or individual verbal, written, or video instructions that support the educational goals of the  cardiac rehab program.   Knowledge Questionnaire Score:     Knowledge Questionnaire Score - 01/16/17 1113      Knowledge Questionnaire Score   Pre Score 14/28      Core Components/Risk Factors/Patient Goals at Admission:     Personal Goals and Risk Factors at Admission - 01/12/17 1015      Core Components/Risk Factors/Patient Goals on Admission   Diabetes Yes   Intervention Provide education about signs/symptoms and action to take for hypo/hyperglycemia.;Provide education about proper nutrition, including hydration, and aerobic/resistive exercise prescription along with prescribed medications to achieve blood glucose in normal ranges: Fasting glucose 65-99 mg/dL   Expected Outcomes Short Term: Participant verbalizes understanding of the signs/symptoms and immediate care of hyper/hypoglycemia, proper foot care and importance of medication, aerobic/resistive exercise and nutrition plan for blood glucose control.;Long Term: Attainment of HbA1C < 7%.      Core Components/Risk Factors/Patient Goals Review:      Goals and Risk Factor Review    Row Name 01/31/17 0000 02/26/17 2026 03/27/17 1328         Core Components/Risk Factors/Patient Goals Review   Personal Goals Review Weight Management/Obesity;Hypertension;Stress;Lipids Weight Management/Obesity;Hypertension;Stress;Lipids Weight Management/Obesity;Hypertension;Stress;Lipids     Review Continue to encourage the attendance to cardiac education classess, reinforce often due to pt dementia. Continue to encourage the attendance to cardiac education classess, reinforce often due to pt dementia. Continue to encourage the attendance to cardiac education classess, reinforce often due to pt dementia.     Expected Outcomes Pt will maintain healthy weigh, lipid values, htn and poistive and healthy coping skills. Pt will maintain healthy weigh, lipid values, htn and poistive and healthy coping skills. Pt will maintain healthy weigh, lipid  values, htn and poistive and healthy coping skills.        Core Components/Risk Factors/Patient Goals at Discharge (Final Review):      Goals and Risk Factor Review - 03/27/17 1328      Core Components/Risk Factors/Patient Goals Review   Personal Goals Review Weight Management/Obesity;Hypertension;Stress;Lipids   Review Continue to encourage the attendance to cardiac education classess, reinforce often due to pt dementia.   Expected Outcomes Pt will maintain healthy weigh, lipid values, htn and poistive and healthy coping skills.      ITP Comments:     ITP Comments    Row Name 01/12/17 1231           ITP Comments Medical Director, Dr. Fransico Him          Comments: Creighton is making expected progress toward personal goals after completing  28 sessions.Psychosocial Assessment - Noted on his last office visit for pacer check that Physically, his son feels that he is doing much better. Unfortunately, his son also feels that his mental status is not back to where it was. He is more forgetful and is taking longer to answer complex questions. He seems to be getting stronger after his operation. Pt recently seen by neurologist who suggested to continue with physical activity and mental exercise.  Continue to reinforce and encourage pt to repeat education classes to assist in reinforcing the information he has heard.  Recommend continued exercise and life style modification education including  stress management and  relaxation techniques to decrease cardiac risk profile. Cherre Huger, BSN Cardiac and Training and development officer

## 2017-03-28 DIAGNOSIS — G459 Transient cerebral ischemic attack, unspecified: Secondary | ICD-10-CM | POA: Diagnosis not present

## 2017-03-28 DIAGNOSIS — N183 Chronic kidney disease, stage 3 (moderate): Secondary | ICD-10-CM | POA: Diagnosis not present

## 2017-03-28 DIAGNOSIS — D649 Anemia, unspecified: Secondary | ICD-10-CM | POA: Diagnosis not present

## 2017-03-28 DIAGNOSIS — E1122 Type 2 diabetes mellitus with diabetic chronic kidney disease: Secondary | ICD-10-CM | POA: Diagnosis not present

## 2017-03-28 DIAGNOSIS — E139 Other specified diabetes mellitus without complications: Secondary | ICD-10-CM | POA: Diagnosis not present

## 2017-03-28 DIAGNOSIS — F0390 Unspecified dementia without behavioral disturbance: Secondary | ICD-10-CM | POA: Diagnosis not present

## 2017-03-28 DIAGNOSIS — Z8546 Personal history of malignant neoplasm of prostate: Secondary | ICD-10-CM | POA: Diagnosis not present

## 2017-03-28 DIAGNOSIS — I2510 Atherosclerotic heart disease of native coronary artery without angina pectoris: Secondary | ICD-10-CM | POA: Diagnosis not present

## 2017-03-28 DIAGNOSIS — I10 Essential (primary) hypertension: Secondary | ICD-10-CM | POA: Diagnosis not present

## 2017-03-28 DIAGNOSIS — E782 Mixed hyperlipidemia: Secondary | ICD-10-CM | POA: Diagnosis not present

## 2017-03-29 ENCOUNTER — Encounter (INDEPENDENT_AMBULATORY_CARE_PROVIDER_SITE_OTHER): Payer: Self-pay

## 2017-03-29 ENCOUNTER — Encounter: Payer: Self-pay | Admitting: Physician Assistant

## 2017-03-29 ENCOUNTER — Encounter (HOSPITAL_COMMUNITY)
Admission: RE | Admit: 2017-03-29 | Discharge: 2017-03-29 | Disposition: A | Discharge status: Home / Self Care | Payer: Medicare Other | Source: Ambulatory Visit | Attending: Internal Medicine | Admitting: Internal Medicine

## 2017-03-29 ENCOUNTER — Ambulatory Visit (INDEPENDENT_AMBULATORY_CARE_PROVIDER_SITE_OTHER): Payer: Medicare Other | Admitting: Physician Assistant

## 2017-03-29 VITALS — BP 132/70 | HR 63 | Ht 67.0 in | Wt 210.8 lb

## 2017-03-29 DIAGNOSIS — Z952 Presence of prosthetic heart valve: Secondary | ICD-10-CM

## 2017-03-29 DIAGNOSIS — E785 Hyperlipidemia, unspecified: Secondary | ICD-10-CM

## 2017-03-29 DIAGNOSIS — I5042 Chronic combined systolic (congestive) and diastolic (congestive) heart failure: Secondary | ICD-10-CM

## 2017-03-29 DIAGNOSIS — Z953 Presence of xenogenic heart valve: Secondary | ICD-10-CM | POA: Diagnosis not present

## 2017-03-29 DIAGNOSIS — I10 Essential (primary) hypertension: Secondary | ICD-10-CM | POA: Diagnosis not present

## 2017-03-29 DIAGNOSIS — Z48812 Encounter for surgical aftercare following surgery on the circulatory system: Secondary | ICD-10-CM | POA: Diagnosis not present

## 2017-03-29 DIAGNOSIS — I2510 Atherosclerotic heart disease of native coronary artery without angina pectoris: Secondary | ICD-10-CM

## 2017-03-29 DIAGNOSIS — Z950 Presence of cardiac pacemaker: Secondary | ICD-10-CM

## 2017-03-29 MED ORDER — LOSARTAN POTASSIUM 100 MG PO TABS: 100.0000 mg | ORAL_TABLET | Freq: Every day | ORAL | 3 refills | Status: AC

## 2017-03-29 NOTE — Progress Notes (Signed)
Cardiology Office Note:    Date:  03/29/2017   ID:  James Curtis, DOB 03-Feb-1941, MRN [id number]  PCP:  James Carol, MD  Cardiologist:  Dr. [name] Curtis   Electrophysiologist: Dr. Allegra Curtis   Referring MD: James Carol, MD   Chief Complaint  Patient presents with  . Follow-up    CHF, s/p AVR    History of Present Illness:    James Curtis is a 75 y.o. male with a hx of HTN, HL, diet-controlled diabetes, tobacco abuse, prior stroke, complete heart block status post pacemaker in 4/18. He was admitted in 4/18 with symptoms of a TIA and complete heart block.  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  He ultimately underwent left atrial mass resection along with bioprosthetic AVR.  Follow-up echocardiogram in 6/18 demonstrated reduced LV function with an EF of 40-45%. His metoprolol was changed to carvedilol at last visit with Dr. Saunders Curtis 6/18.  James Curtis returns for Cardiology follow up.  He is here with his son.  He is overall doing well.  He has a mild cough with clear sputum.  He denies fever, hemoptysis. He denies significant dyspnea on exertion.  He denies chest pain other than incisional soreness from time to time.  He denies orthopnea, paroxysmal nocturnal dyspnea, edema, syncope.    Prior CV studies:   The following studies were reviewed today:  Echo 01/26/17 EF 40-45, inferior and inferoseptal hypokinesis/akinesis, severe LVH, grade 1 diastolic dysfunction, s/p AVR with mean 16, MAC, severe LAE, mildly reduced RVSF  Intraoperative TEE 12/02/16 Severe concentric LVH, EF 60-65, mild to moderate aortic stenosis, mild MR   R/L heart cath 11/25/16 LAD irregularities LCx mild disease; OM3 40 RCA proximal 50 RA 4, mean PA 19, PCWP mean 9, PVR 1.3 WU Mild to mod AS - Mean AV gradient 22, AVA 2 cm; mean gradient on pullback 16, AVA 2.3 cm   ABIs 11/25/16 ABI&'s of the right lower extremity indicate mild/boarderline moderate peripheral arterial disease  with monophasic flow. ABI&'s of the left lower extremity are within normal limits however the dorsal pedis demonstrates dampened monophasic flow.  R 0.8' L 1.06   TEE 11/23/16 Severe concentric LVH, EF 60-65, MAC, 16 x 4 mm mobile echodensity on atrial basal surface of posterior mitral valve, mild MR   Carotid US 11/20/16 Bilateral ICA 1-39   Echo 11/20/16 Mild concentric LVH, vigorous LVF, EF 65-70, normal wall motion, grade 1 diastolic dysfunction, moderate aortic stenosis (mean 26, peak 62), severe MAC, moderate mitral stenosis (mean 5), mild MR, 12 x 7 mm mobile echodensity attached to the posterior mitral valve leaflet, mild LAE, mild TR   Past Medical History:  Diagnosis Date  . Aortic stenosis    a. 11/20/2016 Echo: mod AS;  b. 11/23/2016 TEE restricted mobility of noncoronary cusp;  c. 11/2016 Cath: mean grad 64mHg w/ dual lumen catheter, AVA 2.0cm^2; mean gradient by pullback = 117mg, AVA 2.3cm^2.  . Cardiomyopathy ([facility]   Echo 6/18: EF 40-45, inf/inf-septal HK-AK, Gr 1 DD, severe LVH, MAC, severe LAE, mildly reduced RVSF  . Carotid arterial disease ([facility]   a. 11/2016 Carotid U/S: bilat 1-39% ICA stenosis.  . Chronic kidney disease   . Complete heart block ([facility]   a. 11/21/2016 s/p MDT AzSantina EvansR MRI SureScan (ser # [id number]).  . Dementia    mild  . Family history of adverse reaction to anesthesia    Son James Curtis a difficult time waking up  .  Heart murmur   . Hypercholesteremia   . Hypertension    benign  . Incidental pulmonary nodule, greater than or equal to 37m - needs f/u scan 3 months 11/28/2016   14 x 8 mm (mean diameter of 11 mm) left lower lobe pulmonary nodule with some surrounding ground-glass attenuation (axial image 42 of series 6). 9 x 5 mm (mean diameter of 7 mm) subpleural nodule in the periphery of the right upper lobe (axial image 20 of series 6). A few patchy areas of peripheral predominant ground-glass attenuation and subpleural reticulation are noted,  scattered randomly throu  . Left atrial mass   . 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 11/2009 TEE: EF 60-65%, restricted mobility of noncoronary AoV cusp, 16x4 mm mobile Ca2+ echodensity on the atrial basal surface of the posterior MV leaflet, mild MR, no LA/LAA thrombus.  . Non-obstructive CAD (coronary artery disease)    a. 11/2016 Cath: LM large, nl, LAD min irregs, LCX large, min irregs, OM3 40, RCA large, 578m  . Pre-diabetes   . [facility] ([facility]   s/p radiation  . Renal mass of unknown nature - needs f/u MRI 11/28/2016   Multiple renal lesions are noted in the kidneys bilaterally. Although several of these lesions are low-attenuation, compatible with cysts, other lesions are intermediate to high attenuation, potentially enhancing  . S/P aortic valve replacement with bioprosthetic valve 12/02/2016   23 mm Edwards Intuity rapid deployment bovine pericardial tissue valve  . S/P resection of left atrial mass 12/02/2016   Benign dystrophic fibrotic tissue with calcification attached to posterior mitral annulus by thin fibrotic stalk  . TIA (transient ischemic attack)    a. 11/2016 in setting of complete heart block.    Past Surgical History:  Procedure Laterality Date  . AORTIC VALVE REPLACEMENT N/A 12/02/2016   Procedure: AORTIC VALVE REPLACEMENT (AVR) using a 23 Edwards Intuity Elite Aortic Valve;  Surgeon: ClRexene AlbertsMD;  [place] Service: Open Heart Surgery;  Laterality: N/A;  . EXCISION OF ATRIAL MYXOMA N/A 12/02/2016   Procedure: Resection of left atrial mass ;  Surgeon: ClRexene AlbertsMD;  [facility]: [facility] Service: Open Heart Surgery;  Laterality: N/A;  . PACEMAKER IMPLANT N/A 11/21/2016   Procedure: Pacemaker Implant;  Surgeon: Will MaMeredith LeedsMD;  [facility];  Service: Cardiovascular;  Laterality: N/A;  . RIGHT/LEFT HEART CATH AND CORONARY ANGIOGRAPHY N/A 11/25/2016   Procedure: Right/Left Heart Cath and Coronary Angiography;   Surgeon: ChNelva BushMD;  [facility];  Service: Cardiovascular;  Laterality: N/A;  . TEE WITHOUT CARDIOVERSION N/A 11/23/2016   Procedure: TRANSESOPHAGEAL ECHOCARDIOGRAM (TEE);  Surgeon: MaJerline PainMD;  [place] [facility] Service: Cardiovascular;  Laterality: N/A;  . TEE WITHOUT CARDIOVERSION N/A 12/02/2016   Procedure: TRANSESOPHAGEAL ECHOCARDIOGRAM (TEE);  Surgeon: ClRexene AlbertsMD;  Location: [facility] Service: Open Heart Surgery;  Laterality: N/A;    Current Medications: Current Meds  Medication Sig  . amLODipine (NORVASC) 10 MG tablet Take 10 mg by mouth daily.  . Marland Kitchenpoaequorin (PREVAGEN PO) Take 1 tablet by mouth daily. For Brain and Memory Health - OTC  . aspirin EC 325 MG tablet Take 1 tablet (325 mg total) by mouth daily.  . carvedilol (COREG) 6.25 MG tablet Take 1 tablet (6.25 mg total) by mouth 2 (two) times daily.  . cholecalciferol (VITAMIN D) 1000 units tablet Take 1,000 Units by mouth  daily.  . iron polysaccharides (NIFEREX) 150 MG capsule Take 1 capsule (150 mg total) by mouth daily.  . simvastatin (ZOCOR) 20 MG tablet Take 20 mg by mouth at bedtime.   . Tetrahydrozoline HCl (VISINE OP) Apply to eye as directed.  . [DISCONTINUED] valsartan (DIOVAN) 320 MG tablet Take 320 mg by mouth daily.     Allergies:   Patient has no known allergies.   Social History   Social History  . Marital status: Widowed    Spouse name: N/A  . Number of children: 2  . Years of education: 12   Occupational History  .      retired   Social History Main Topics  . Smoking status: Former Smoker    Packs/day: 0.25    Types: Cigarettes    Quit date: 11/16/2016  . Smokeless tobacco: Never Used  . Alcohol use No  . Drug use: No  . Sexual activity: Not Currently   Other Topics Concern  . None   Social History Narrative   Lives alone   Caffeine      Family Hx: The patient's family history includes Cancer in his father.  ROS:   Please see the history  of present illness.    ROS All other systems reviewed and are negative.   EKGs/Labs/Other Test Reviewed:    EKG:  EKG is  ordered today.  The ekg ordered today demonstrates AV paced, HR 63  Recent Labs: 12/03/2016: Magnesium 2.5 12/05/2016: ALT 18 12/14/2016: BUN 16; Creatinine 1.3; Hemoglobin 8.8; Platelets 244; Potassium 4.4; Sodium 146   Recent Lipid Panel Lab Results  Component Value Date/Time   CHOL 106 11/19/2016 03:23 AM   TRIG 97 11/19/2016 03:23 AM   HDL 27 (L) 11/19/2016 03:23 AM   CHOLHDL 3.9 11/19/2016 03:23 AM   LDLCALC 60 11/19/2016 03:23 AM    Physical Exam:    VS:  BP 132/70   Pulse 63   Ht 5' 7"  (1.702 m)   Wt 210 lb 12.8 oz (95.6 kg)   BMI 33.02 kg/m     Wt Readings from Last 3 Encounters:  03/29/17 210 lb 12.8 oz (95.6 kg)  03/07/17 205 lb (93 kg)  02/22/17 208 lb 6.4 oz (94.5 kg)     Physical Exam  Constitutional: He is oriented to person, place, and time. He appears well-developed and well-nourished. No distress.  HENT:  Head: Normocephalic and atraumatic.  Eyes: No scleral icterus.  Neck: Normal range of motion. No JVD present.  Cardiovascular: Normal rate, regular rhythm, S1 normal and S2 normal.   Murmur heard.  Medium-pitched early systolic murmur is present with a grade of 2/6  at the upper right sternal border Pulmonary/Chest: Effort normal and breath sounds normal. He has no wheezes. He has no rhonchi. He has no rales.  Abdominal: Soft. There is no hepatomegaly. There is no tenderness.  Musculoskeletal: He exhibits no edema.  Neurological: He is alert and oriented to person, place, and time.  Skin: Skin is warm and dry.  Psychiatric: He has a normal mood and affect.    ASSESSMENT:    1. Chronic combined systolic and diastolic heart failure ([place])   2. S/P AVR (aortic valve replacement)   3. Essential hypertension   4. Coronary artery disease involving native coronary artery of native heart without angina pectoris   5. Hyperlipidemia,  unspecified hyperlipidemia type   6. Cardiac pacemaker in situ    PLAN:    In order of problems listed above:  1. Chronic combined systolic and diastolic heart failure (HCC) -  NYHA 2.  EF after AVR reduced at 40-45.  Volume stable on exam.  Continue current dose of beta-blocker.  With recent recall on Valsartan, I will change him to Losartan.  -  DC Valsartan  -  Start Losartan 100 mg QD  -  Monitor BP at cardiac rehabilitation; increase Coreg if BP above target  -  BMET 2 weeks  2. S/P AVR (aortic valve replacement) -  Well functioning valve by recent echocardiogram.  Continue SBE prophylaxis.  Recommend continuing ASAS 325 QD until seen by Dr. Roxy Manns.  Would reduce ASA to 81 mg QD when ok with Dr. Roxy Manns.   3. Essential hypertension The patient's blood pressure is controlled on his current regimen.     4. Coronary artery disease involving native coronary artery of native heart without angina pectoris -  Non-obstructive CAD prior to AVR.  No angina.  Continue ASA, beta-blocker, statin.   5. Hyperlipidemia, unspecified hyperlipidemia type Managed by PCP.   6. Cardiac pacemaker in situ FU with EP as planned.   Dispo:  Return in about 3 months (around 06/29/2017) for Routine Follow Up, w/ Dr. Saunders Curtis, or [name], PA-C.   Medication Adjustments/Labs and Tests Ordered: Current medicines are reviewed at length with the patient today.  Concerns regarding medicines are outlined above.  Tests Ordered: Orders Placed This Encounter  Procedures  . Basic Metabolic Panel (BMET)  . EKG 12-Lead   Medication Changes: Meds ordered this encounter  Medications  . losartan (COZAAR) 100 MG tablet    Sig: Take 1 tablet (100 mg total) by mouth daily.    Dispense:  90 tablet    Refill:  3    Signed, [name], PA-C  03/29/2017 2:43 PM    [address], [place], [place]  [address] Phone: [phone]; Fax: [phone]

## 2017-03-29 NOTE — Patient Instructions (Signed)
Medication Instructions:  1. STOP VALSARTAN  2. START LOSARTAN 100 MG DAILY; RX HAS BEEN SENT IN  Labwork: IN 2 WEEKS BMET TO BE DONE  Testing/Procedures: NONE ORDERED TODAY  Follow-Up: 1. 3-4 MONTHS WITH DR. END   Any Other Special Instructions Will Be Listed Below (If Applicable). CALL IF BLOOD PRESSURE AT CARDIAC REHAB IS CONSISTENTLY 140/920 OR HIGHER AFTER CHANGING TO LOSARTAN   MAKE SURE TO ASK DR. OWEN AT YOUR NEXT APPT WHEN IT IS OK TO DECREASE ASPIRIN TO 50 MG DAILY; PLEASE LET OUR OFFICE KNOW SO THAT WE MAY STAY UP TO DATE AS WELL [phone]    If you need a refill on your cardiac medications before your next appointment, please call your pharmacy.

## 2017-03-31 ENCOUNTER — Encounter (HOSPITAL_COMMUNITY)
Admission: RE | Admit: 2017-03-31 | Discharge: 2017-03-31 | Disposition: A | Discharge status: Home / Self Care | Payer: Medicare Other | Source: Ambulatory Visit | Attending: Internal Medicine | Admitting: Internal Medicine

## 2017-03-31 DIAGNOSIS — Z952 Presence of prosthetic heart valve: Secondary | ICD-10-CM

## 2017-03-31 DIAGNOSIS — Z953 Presence of xenogenic heart valve: Secondary | ICD-10-CM | POA: Diagnosis not present

## 2017-03-31 DIAGNOSIS — Z48812 Encounter for surgical aftercare following surgery on the circulatory system: Secondary | ICD-10-CM | POA: Diagnosis not present

## 2017-04-03 ENCOUNTER — Encounter: Payer: Self-pay | Admitting: Thoracic Surgery (Cardiothoracic Vascular Surgery)

## 2017-04-03 ENCOUNTER — Encounter (HOSPITAL_COMMUNITY)
Admission: RE | Admit: 2017-04-03 | Discharge: 2017-04-03 | Disposition: A | Discharge status: Home / Self Care | Payer: Medicare Other | Source: Ambulatory Visit | Attending: Internal Medicine | Admitting: Internal Medicine

## 2017-04-03 ENCOUNTER — Other Ambulatory Visit: Payer: Self-pay | Admitting: *Deleted

## 2017-04-03 ENCOUNTER — Ambulatory Visit (INDEPENDENT_AMBULATORY_CARE_PROVIDER_SITE_OTHER): Payer: Medicare Other | Admitting: Thoracic Surgery (Cardiothoracic Vascular Surgery)

## 2017-04-03 VITALS — BP 131/73 | HR 60 | Resp 16 | Ht 67.0 in | Wt 212.0 lb

## 2017-04-03 DIAGNOSIS — I5189 Other ill-defined heart diseases: Secondary | ICD-10-CM

## 2017-04-03 DIAGNOSIS — I519 Heart disease, unspecified: Secondary | ICD-10-CM

## 2017-04-03 DIAGNOSIS — Z953 Presence of xenogenic heart valve: Secondary | ICD-10-CM | POA: Diagnosis not present

## 2017-04-03 DIAGNOSIS — Z952 Presence of prosthetic heart valve: Secondary | ICD-10-CM

## 2017-04-03 DIAGNOSIS — I2510 Atherosclerotic heart disease of native coronary artery without angina pectoris: Secondary | ICD-10-CM

## 2017-04-03 DIAGNOSIS — R918 Other nonspecific abnormal finding of lung field: Secondary | ICD-10-CM

## 2017-04-03 DIAGNOSIS — R911 Solitary pulmonary nodule: Secondary | ICD-10-CM

## 2017-04-03 DIAGNOSIS — Z48812 Encounter for surgical aftercare following surgery on the circulatory system: Secondary | ICD-10-CM | POA: Diagnosis not present

## 2017-04-03 NOTE — Progress Notes (Signed)
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  He was last seen here in our office on 01/02/2017 at which time he was recovering reasonably well. Since then he has been seen in follow-up by Dr. Saunders Revel, Dr. Curt Bears, and more recently by [name] at [facility].  Follow-up echocardiogram performed 01/30/2017 revealed normal functioning bioprosthetic tissue valve in the aortic position with mean transvalvular gradient estimated 16 mmHg. Left ventricular systolic function was mild to moderately reduced with ejection fraction estimated 40-45%.  The patient returns to the office today with his son present. He reports that overall he is doing quite well. He has been participating in outpatient cardiac rehabilitation program and apparently is close to completion. He reports no significant exertional shortness of breath. He has no significant pain in his chest. He is concerned about the fact that his blood pressure medication was just changed recently.   Current Outpatient Prescriptions  Medication Sig Dispense Refill  . amLODipine (NORVASC) 10 MG tablet Take 10 mg by mouth daily.  11  . Apoaequorin (PREVAGEN PO) Take 1 tablet by mouth daily. For Brain and Memory Health - OTC    .  aspirin EC 325 MG tablet Take 1 tablet (325 mg total) by mouth daily.    . carvedilol (COREG) 6.25 MG tablet Take 1 tablet (6.25 mg total) by mouth 2 (two) times daily. 60 tablet 6  . cholecalciferol (VITAMIN D) 1000 units tablet Take 1,000 Units by mouth daily.    . iron polysaccharides (NIFEREX) 150 MG capsule Take 1 capsule (150 mg total) by mouth daily.    Marland Kitchen losartan (COZAAR) 100 MG tablet Take 1 tablet (100 mg total) by mouth daily. 90 tablet 3  . simvastatin (ZOCOR) 20 MG tablet Take 20 mg by mouth at bedtime.     . Tetrahydrozoline HCl (VISINE OP) Apply to eye as directed.     No current facility-administered medications for this visit.       Physical Exam:   BP 131/73 (BP Location: Left Arm, Patient Position: Sitting, Cuff Size: Large)   Pulse 60   Resp 16   Ht 5\' 7"  (1.702 m)   Wt 212 lb (96.2 kg)   SpO2 96% Comment: RA  BMI 33.20 kg/m   General:  Well-appearing  Chest:   Clear to auscultation  CV:   Regular rate and rhythm with soft systolic murmur  Incisions:  Completely healed, sternum is stable  Abdomen:  Soft and nontender  Extremities:  Warm and well-perfused  Diagnostic Tests:  Transthoracic Echocardiography  Patient:    James Curtis, James Curtis MR #:       [id number] Study Date: 01/26/2017 Gender:     M Age:        28 Height:  168.9 cm Weight:     95.4 kg BSA:        2.15 m^2 Pt. Status: Room:   ATTENDING    Letha Cape, Scott T  REFERRING    [name] T  SONOGRAPHER  Cindy Hazy, RDCS  PERFORMING   [facility], Outpatient  cc:  -------------------------------------------------------------------  ------------------------------------------------------------------- Indications:      I35.9 Aortic Valve Disorder.  ------------------------------------------------------------------- History:   PMH:  Acquired from the patient and from the patient&'s chart.  PMH:  History of Aortic Stenosis. History of Complete  Heart Block. Dementia.  Risk factors:  Current tobacco use. Hypertension. Diabetes mellitus. Dyslipidemia.  ------------------------------------------------------------------- Study Conclusions  - Left ventricle: LVEF is approximately 40 to 45% with   hypokinesis/akinesis of the mid/distal inferior and inferoseptal   walls and apex. The cavity size was normal. Wall thickness was   increased in a pattern of severe LVH. Doppler parameters are   consistent with abnormal left ventricular relaxation (grade 1   diastolic dysfunction). - Mitral valve: Calcified annulus. Mildly thickened leaflets . - Left atrium: The atrium was severely dilated. - Right ventricle: Systolic function was mildly reduced.  Impressions:  - Compared to echo from April 2018 LVEF is depressed and wall   motion changes are new.  ------------------------------------------------------------------- Labs, prior tests, procedures, and surgery: Status post Aortic Valve Replacement.  Permanent pacemaker system implantation.  ------------------------------------------------------------------- Study data:   Study status:  Routine.  Procedure:  The patient reported no pain pre or post test. Transthoracic echocardiography for left ventricular function evaluation, for right ventricular function evaluation, and for assessment of valvular function. Image quality was adequate.  Study completion:  There were no complications.          Transthoracic echocardiography.  M-mode, complete 2D, spectral Doppler, and color Doppler.  Birthdate: Patient birthdate: 1941-02-04.  Age:  Patient is 76 yr old.  Sex: Gender: male.    BMI: 33.4 kg/m^2.  Blood pressure:     133/77 Patient status:  Outpatient.  Study date:  Study date: 01/26/2017. Study time: 11:31 AM.  Location:  [facility]  3  -------------------------------------------------------------------  ------------------------------------------------------------------- Left ventricle:  LVEF is approximately 40 to 45% with hypokinesis/akinesis of the mid/distal inferior and inferoseptal walls and apex. The cavity size was normal. Wall thickness was increased in a pattern of severe LVH. Doppler parameters are consistent with abnormal left ventricular relaxation (grade 1 diastolic dysfunction).  ------------------------------------------------------------------- Aortic valve:  AV is thickened, calcified Difficult to see well Peak and mean gradiehts through the valve are 29 and 20 mm Hg respectively consistent with mod AS.  Doppler:     VTI ratio of LVOT to aortic valve: 0.45. Peak velocity ratio of LVOT to aortic valve: 0.38. Mean velocity ratio of LVOT to aortic valve: 0.44. Mean gradient (S): 16 mm Hg. Peak gradient (S): 30 mm Hg.  ------------------------------------------------------------------- Aorta:  Ascending aorta is mildly dilated at 43 mm.  ------------------------------------------------------------------- Mitral valve:  Mitral valve is thickened with severe annular calcification. Mobile echodensity on atrial side of annulus suspicious for vegeitation. Peak and mean gradients through the valve are 15 and 6 mm Hg respectively MVA by P T1/2 is 1.43 cm2 consistent with mild mitral stenosis.  Calcified annulus. Mildly thickened leaflets .  Doppler:     Valve area by pressure half-time: 1.37 cm^2. Indexed valve area by pressure half-time: 0.64 cm^2/m^2.    Mean gradient (D): 6 mm Hg. Peak gradient (D): 4 mm  Hg.  ------------------------------------------------------------------- Left atrium:  The atrium was severely dilated.  ------------------------------------------------------------------- Right ventricle:  The cavity size was normal. Systolic function was mildly  reduced.  ------------------------------------------------------------------- Pulmonic valve:    Structurally normal valve.   Cusp separation was normal.  Doppler:  Transvalvular velocity was within the normal range. There was mild regurgitation.  ------------------------------------------------------------------- Tricuspid valve:   Structurally normal valve.   Leaflet separation was normal.  Doppler:  Transvalvular velocity was within the normal range. There was trivial regurgitation.  ------------------------------------------------------------------- Right atrium:  The atrium was normal in size.  ------------------------------------------------------------------- Pericardium:  There was no pericardial effusion.  ------------------------------------------------------------------- Systemic veins: Inferior vena cava: The vessel was normal in size. The respirophasic diameter changes were in the normal range (>= 50%), consistent with normal central venous pressure.  ------------------------------------------------------------------- Post procedure conclusions Ascending Aorta:  - Ascending aorta is mildly dilated at 43 mm.  ------------------------------------------------------------------- Measurements   Left ventricle                           Value          Reference  LV ID, ED, PLAX chordal                  44.1  mm       43 - 52  LV ID, ES, PLAX chordal                  27.9  mm       23 - 38  LV fx shortening, PLAX chordal           37    %        >=29  LV PW thickness, ED                      16.5  mm       ---------  IVS/LV PW ratio, ED                      1.15           <=1.3  LV e&', lateral                           6.47  cm/s     ---------  LV E/e&', lateral                         14.84          ---------  LV e&', medial                            4.93  cm/s     ---------  LV E/e&', medial                          19.47          ---------  LV e&',  average                           5.7   cm/s     ---------  LV E/e&', average                         16.84          ---------  Ventricular septum                       Value          Reference  IVS thickness, ED                        19    mm       ---------    LVOT                                     Value          Reference  LVOT peak velocity, S                    102   cm/s     ---------  LVOT mean velocity, S                    82.3  cm/s     ---------  LVOT VTI, S                              22.4  cm       ---------  LVOT peak gradient, S                    4     mm Hg    ---------    Aortic valve                             Value          Reference  Aortic valve peak velocity, S            272   cm/s     ---------  Aortic valve mean velocity, S            186   cm/s     ---------  Aortic valve VTI, S                      50    cm       ---------  Aortic mean gradient, S                  16    mm Hg    ---------  Aortic peak gradient, S                  30    mm Hg    ---------  VTI ratio, LVOT/AV                       0.45           ---------  Velocity ratio, peak, LVOT/AV            0.38           ---------  Velocity ratio, mean, LVOT/AV            0.44           ---------  Aortic regurg pressure half-time         443   ms       ---------    Aorta  Value          Reference  Aortic root ID, ED                       33    mm       ---------  Ascending aorta ID, A-P, S               43    mm       ---------    Left atrium                              Value          Reference  LA ID, A-P, ES                           53    mm       ---------  LA ID/bsa, A-P                   (H)     2.46  cm/m^2   <=2.2  LA volume, S                             110   ml       ---------  LA volume/bsa, S                         51.1  ml/m^2   ---------  LA volume, ES, 1-p A4C                   113   ml       ---------  LA volume/bsa, ES, 1-p A4C                52.5  ml/m^2   ---------  LA volume, ES, 1-p A2C                   102   ml       ---------  LA volume/bsa, ES, 1-p A2C               47.4  ml/m^2   ---------    Mitral valve                             Value          Reference  Mitral E-wave peak velocity              96    cm/s     ---------  Mitral A-wave peak velocity              178   cm/s     ---------  Mitral mean velocity, D                  109   cm/s     ---------  Mitral deceleration time         (H)     352   ms       150 - 230  Mitral pressure half-time                154   ms       ---------  Mitral mean gradient, D  6     mm Hg    ---------  Mitral peak gradient, D                  4     mm Hg    ---------  Mitral E/A ratio, peak                   0.5            ---------  Mitral valve area, PHT, DP               1.37  cm^2     ---------  Mitral valve area/bsa, PHT, DP           0.64  cm^2/m^2 ---------  Mitral annulus VTI, D                    53.5  cm       ---------    Tricuspid valve                          Value          Reference  Tricuspid regurg peak velocity           256   cm/s     ---------  Tricuspid peak RV-RA gradient            26    mm Hg    ---------    Right ventricle                          Value          Reference  RV s&', lateral, S                        9.43  cm/s     ---------  Legend: (L)  and  (H)  mark values outside specified reference range.  ------------------------------------------------------------------- Prepared and Electronically Authenticated by  Dorris Carnes, M.D. 2018-06-14T20:30:08   Impression:  Patient is doing well more than 4 months status post resection of left atrial mass and aortic valve replacement using a bioprosthetic tissue valve.  Plan:  I have encouraged the patient to continue to gradually increase his physical activity as tolerated without any particular limitations at this time we have not recommended any changes to his current  medications. We will obtain a noncontrast CT scan of the chest to follow-up the numerous small benign-appearing nodule seen on previous CT scan last spring.  The patient has been reminded regarding the importance of dental hygiene and the lifelong need for antibiotic prophylaxis for all dental cleanings and other related invasive procedures.  The patient will return to our office for routine follow-up next April, approximately 1 year following his surgery. All of their questions have been addressed.  I spent in excess of 15 minutes during the conduct of this office consultation and >50% of this time involved direct face-to-face encounter with the patient for counseling and/or coordination of their care.    Valentina Gu. Roxy Manns, MD 04/03/2017 3:24 PM

## 2017-04-03 NOTE — Patient Instructions (Addendum)
Continue all previous medications without any changes at this time.  It is okay to decrease aspirin to 81 mg/day  You may resume unrestricted physical activity without any particular limitations at this time.  Endocarditis is a potentially serious infection of heart valves or inside lining of the heart.  It occurs more commonly in patients with diseased heart valves (such as patient's with aortic or mitral valve disease) and in patients who have undergone heart valve repair or replacement.  Certain surgical and dental procedures may put you at risk, such as dental cleaning, other dental procedures, or any surgery involving the respiratory, urinary, gastrointestinal tract, gallbladder or prostate gland.   To minimize your chances for develooping endocarditis, maintain good oral health and seek prompt medical attention for any infections involving the mouth, teeth, gums, skin or urinary tract.    Always notify your doctor or dentist about your underlying heart valve condition before having any invasive procedures. You will need to take antibiotics before certain procedures, including all routine dental cleanings or other dental procedures.  Your cardiologist or dentist should prescribe these antibiotics for you to be taken ahead of time.

## 2017-04-05 ENCOUNTER — Encounter (HOSPITAL_COMMUNITY)
Admission: RE | Admit: 2017-04-05 | Discharge: 2017-04-05 | Disposition: A | Discharge status: Home / Self Care | Payer: Medicare Other | Source: Ambulatory Visit | Attending: Internal Medicine | Admitting: Internal Medicine

## 2017-04-05 DIAGNOSIS — Z48812 Encounter for surgical aftercare following surgery on the circulatory system: Secondary | ICD-10-CM | POA: Diagnosis not present

## 2017-04-05 DIAGNOSIS — Z952 Presence of prosthetic heart valve: Secondary | ICD-10-CM | POA: Diagnosis not present

## 2017-04-05 DIAGNOSIS — Z953 Presence of xenogenic heart valve: Secondary | ICD-10-CM | POA: Diagnosis not present

## 2017-04-07 ENCOUNTER — Encounter (HOSPITAL_COMMUNITY)
Admission: RE | Admit: 2017-04-07 | Discharge: 2017-04-07 | Disposition: A | Discharge status: Home / Self Care | Payer: Medicare Other | Source: Ambulatory Visit | Attending: Internal Medicine | Admitting: Internal Medicine

## 2017-04-07 DIAGNOSIS — Z953 Presence of xenogenic heart valve: Secondary | ICD-10-CM | POA: Diagnosis not present

## 2017-04-07 DIAGNOSIS — Z952 Presence of prosthetic heart valve: Secondary | ICD-10-CM

## 2017-04-07 DIAGNOSIS — Z48812 Encounter for surgical aftercare following surgery on the circulatory system: Secondary | ICD-10-CM | POA: Diagnosis not present

## 2017-04-10 ENCOUNTER — Encounter (HOSPITAL_COMMUNITY): Payer: Medicare Other

## 2017-04-12 ENCOUNTER — Other Ambulatory Visit: Payer: Medicare Other | Admitting: *Deleted

## 2017-04-12 ENCOUNTER — Encounter (HOSPITAL_COMMUNITY)
Admission: RE | Admit: 2017-04-12 | Discharge: 2017-04-12 | Disposition: A | Discharge status: Home / Self Care | Payer: Medicare Other | Source: Ambulatory Visit | Attending: Internal Medicine | Admitting: Internal Medicine

## 2017-04-12 DIAGNOSIS — Z952 Presence of prosthetic heart valve: Secondary | ICD-10-CM

## 2017-04-12 DIAGNOSIS — Z953 Presence of xenogenic heart valve: Secondary | ICD-10-CM | POA: Diagnosis not present

## 2017-04-12 DIAGNOSIS — I5042 Chronic combined systolic (congestive) and diastolic (congestive) heart failure: Secondary | ICD-10-CM | POA: Diagnosis not present

## 2017-04-12 DIAGNOSIS — Z48812 Encounter for surgical aftercare following surgery on the circulatory system: Secondary | ICD-10-CM | POA: Diagnosis not present

## 2017-04-12 NOTE — Progress Notes (Signed)
Discharge Progress Report  Patient Details  Name: James Curtis MRN: [id number] Date of Birth: 1940/09/21 Referring Provider:     [facility] from 01/12/2017 in [place]  Referring Provider  End, [name]  MD       Number of Visits: 73  Reason for Discharge:  Patient reached a stable level of exercise.  Smoking History:  History  Smoking Status  . Former Smoker  . Packs/day: 0.25  . Types: Cigarettes  . Quit date: 11/16/2016  Smokeless Tobacco  . Never Used    Diagnosis:  12/02/16 S/P AVR (aortic valve replacement)  ADL UCSD:   Initial Exercise Prescription:     Initial Exercise Prescription - 01/12/17 1300      Date of Initial Exercise RX and Referring Provider   Date 01/12/17   Referring Provider End, [name]  MD     Recumbant Bike   Level 2.5   Minutes 10   METs 2.3     NuStep   Level 3   SPM 70   Minutes 10   METs 2.2     Track   Laps 10   Minutes 10   METs 2.74     Prescription Details   Frequency (times per week) 3   Duration Progress to 30 minutes of continuous aerobic without signs/symptoms of physical distress     Intensity   THRR 40-80% of Max Heartrate 58-115   Ratings of Perceived Exertion 11-13   Perceived Dyspnea 0-4     Progression   Progression Continue to progress workloads to maintain intensity without signs/symptoms of physical distress.     Resistance Training   Training Prescription No   Weight 0      Discharge Exercise Prescription (Final Exercise Prescription Changes):     Exercise Prescription Changes - 04/14/17 1614      Response to Exercise   Blood Pressure (Admit) 124/80   Blood Pressure (Exercise) 138/68   Blood Pressure (Exit) 124/70   Heart Rate (Admit) 61 bpm   Heart Rate (Exercise) 90 bpm   Heart Rate (Exit) 56 bpm   Rating of Perceived Exertion (Exercise) 12   Symptoms none   Duration Continue with 30 min of aerobic exercise without  signs/symptoms of physical distress.   Intensity THRR unchanged     Progression   Progression Continue to progress workloads to maintain intensity without signs/symptoms of physical distress.   Average METs 2.7     Resistance Training   Training Prescription Yes   Weight 3lbs   Reps 10-15   Time 10 Minutes     Recumbant Bike   Level 4   Minutes 10   METs 2.2     NuStep   Level 5   SPM 90   Minutes 10   METs 2.4     Track   Laps 14   Minutes 10   METs 3.43     Home Exercise Plan   Plans to continue exercise at [employer] (comment)  walking at home or in grocery stores with son   Frequency Add 2 additional days to program exercise sessions.   Initial Home Exercises Provided 02/01/17      Functional Capacity:     6 Minute Walk    Row Name 01/12/17 1238 04/07/17 1104 05/05/17 1619     6 Minute Walk   Phase Initial Discharge  -   Distance 1564 feet 1600 feet  -   Distance %  Change  - 2.3 %  -   Distance Feet Change  -  - 36 ft   Walk Time 6 minutes 6 minutes  -   # of Rest Breaks 0 0  -   MPH 2.96 3.03  -   METS 3.02 2.88  -   RPE 13 13  -   VO2 Peak 10.58 10.09  -   Symptoms Yes (comment) No  -   Comments fatigue at end of test fatigue at end of test  -   Resting HR 77 bpm 67 bpm  -   Resting BP 130/72 142/62  -   Max Ex. HR 109 bpm 96 bpm  -   Max Ex. BP 162/68 156/62  -   2 Minute Post BP 128/72 122/72  -      Psychological, QOL, Others - Outcomes: PHQ 2/9: Depression screen [facility] 2/9 04/12/2017 01/16/2017  Decreased Interest 0 1  Down, Depressed, Hopeless 0 0  PHQ - 2 Score 0 1    Quality of Life:     Quality of Life - 05/09/17 1612      Quality of Life Scores   Health/Function Post 7.97 %   Socioeconomic Post 11.21 %   Psych/Spiritual Post 12.79 %   Family Post 13.5 %   GLOBAL Post 10.44 %      Personal Goals: Goals established at orientation with interventions provided to work toward goal.     Personal Goals and Risk  Factors at Admission - 01/12/17 1015      Core Components/Risk Factors/Patient Goals on Admission   Diabetes Yes   Intervention Provide education about signs/symptoms and action to take for hypo/hyperglycemia.;Provide education about proper nutrition, including hydration, and aerobic/resistive exercise prescription along with prescribed medications to achieve blood glucose in normal ranges: Fasting glucose 65-99 mg/dL   Expected Outcomes Short Term: Participant verbalizes understanding of the signs/symptoms and immediate care of hyper/hypoglycemia, proper foot care and importance of medication, aerobic/resistive exercise and nutrition plan for blood glucose control.;Long Term: Attainment of HbA1C < 7%.       Personal Goals Discharge:     Goals and Risk Factor Review    Row Name 01/31/17 0000 02/26/17 2026 03/27/17 1328         Core Components/Risk Factors/Patient Goals Review   Personal Goals Review Weight Management/Obesity;Hypertension;Stress;Lipids Weight Management/Obesity;Hypertension;Stress;Lipids Weight Management/Obesity;Hypertension;Stress;Lipids     Review Continue to encourage the attendance to cardiac education classess, reinforce often due to pt dementia. Continue to encourage the attendance to cardiac education classess, reinforce often due to pt dementia. Continue to encourage the attendance to cardiac education classess, reinforce often due to pt dementia.     Expected Outcomes Pt will maintain healthy weigh, lipid values, htn and poistive and healthy coping skills. Pt will maintain healthy weigh, lipid values, htn and poistive and healthy coping skills. Pt will maintain healthy weigh, lipid values, htn and poistive and healthy coping skills.        Exercise Goals and Review:     Exercise Goals    Row Name 01/12/17 1323             Exercise Goals   Increase Physical Activity Yes       Intervention Provide advice, education, support and counseling about physical  activity/exercise needs.;Develop an individualized exercise prescription for aerobic and resistive training based on initial evaluation findings, risk stratification, comorbidities and participant's personal goals.       Expected Outcomes Achievement of increased cardiorespiratory  fitness and enhanced flexibility, muscular endurance and strength shown through measurements of functional capacity and personal statement of participant.       Increase Strength and Stamina Yes  Return to normal activities       Intervention Provide advice, education, support and counseling about physical activity/exercise needs.;Develop an individualized exercise prescription for aerobic and resistive training based on initial evaluation findings, risk stratification, comorbidities and participant's personal goals.       Expected Outcomes Achievement of increased cardiorespiratory fitness and enhanced flexibility, muscular endurance and strength shown through measurements of functional capacity and personal statement of participant.          Nutrition & Weight - Outcomes:     Pre Biometrics - 01/12/17 1242      Pre Biometrics   Waist Circumference 42 inches   Hip Circumference 44 inches   Waist to Hip Ratio 0.95 %   Triceps Skinfold 21 mm   % Body Fat 32.5 %   Grip Strength 32 kg   Flexibility 10 in   Single Leg Stand 4.31 seconds         Post Biometrics - 05/05/17 1617       Post  Biometrics   Height 5' 6.5" (1.689 m)   Weight 210 lb 12.2 oz (95.6 kg)   Waist Circumference 41.75 inches   Hip Circumference 43.5 inches   Waist to Hip Ratio 0.96 %   BMI (Calculated) 33.51   Triceps Skinfold 21 mm   % Body Fat 32.2 %   Grip Strength 36 kg   Flexibility 12 in   Single Leg Stand 9.25 seconds      Nutrition:     Nutrition Therapy & Goals - 02/10/17 1211      Nutrition Therapy   Diet Carb Modified, Therapeutic Lifestyle Changes     Personal Nutrition Goals   Nutrition Goal Pt to maintain his  wt given dx of dementia. If pt loses wt, wt loss of no more than 1-2 lb/week recommended.     Intervention Plan   Intervention Prescribe, educate and counsel regarding individualized specific dietary modifications aiming towards targeted core components such as weight, hypertension, lipid management, diabetes, heart failure and other comorbidities.   Expected Outcomes Short Term Goal: Understand basic principles of dietary content, such as calories, fat, sodium, cholesterol and nutrients.;Long Term Goal: Adherence to prescribed nutrition plan.      Nutrition Discharge:     Nutrition Assessments - 02/10/17 1211      MEDFICTS Scores   Pre Score 45      Education Questionnaire Score:     Knowledge Questionnaire Score - 01/16/17 1113      Knowledge Questionnaire Score   Pre Score 14/28      Goals reviewed with patient. Pt will graduate from cardiac rehab program om 8/30 with completion of 34 exercise sessions in Phase II. Pt maintained good attendance and progressed nicely during his participation in rehab as evidenced by increased MET level.   Medication list reconciled with pt son. Repeat  PHQ score-0 which is a decrease from 1 . Pt completed post assessment quality of life survey.  Pt scored the following:      Quality of Life - 05/09/17 1612      Quality of Life Scores   Health/Function Post 7.97 %   Socioeconomic Post 11.21 %   Psych/Spiritual Post 12.79 %   Family Post 13.5 %   GLOBAL Post 10.44 %      Pt  has made  lifestyle changes and should be commended for his success. Pt feels he has made progress toward his goals during cardiac rehab.  Pt has returned to his activities he did prior to his cardiac event.  Pt has not yet returned to driving.  Pt son is unsure if cognitively and vision wise pt can return to driving. Pt has short term memory loss and lacks judgement at times. Pt plans to continue exercise in cardiac maintenance program once we receive the okay from Dr.  Saunders Revel. Cherre Huger, BSN Cardiac and Training and development officer

## 2017-04-13 ENCOUNTER — Telehealth: Payer: Self-pay | Admitting: *Deleted

## 2017-04-13 DIAGNOSIS — I5042 Chronic combined systolic (congestive) and diastolic (congestive) heart failure: Secondary | ICD-10-CM

## 2017-04-13 LAB — BASIC METABOLIC PANEL
BUN/Creatinine Ratio: 12 (ref 10–24)
BUN: 20 mg/dL (ref 8–27)
CALCIUM: 9.1 mg/dL (ref 8.6–10.2)
CHLORIDE: 107 mmol/L — AB (ref 96–106)
CO2: 21 mmol/L (ref 20–29)
Creatinine, Ser: 1.62 mg/dL — ABNORMAL HIGH (ref 0.76–1.27)
GFR calc non Af Amer: 41 mL/min/{1.73_m2} — ABNORMAL LOW (ref >59)
GFR, EST AFRICAN AMERICAN: 47 mL/min/{1.73_m2} — AB (ref >59)
Glucose: 99 mg/dL (ref 65–99)
POTASSIUM: 4.2 mmol/L (ref 3.5–5.2)
Sodium: 145 mmol/L — ABNORMAL HIGH (ref 134–144)

## 2017-04-13 NOTE — Telephone Encounter (Signed)
Pt's son POA (DPR) aware of lab results by phone with verbal understanding. Son agreeable to repeat lab work to be done in 1 month 05/15/17.

## 2017-04-13 NOTE — Telephone Encounter (Signed)
-----   Message from Liliane Shi, [place] sent at 04/13/2017  2:05 PM EDT ----- Please call the patient The Kidney function is stable. Continue with current treatment plan. Repeat BMET 4 weeks. [name], PA-C   04/13/2017 2:05 PM

## 2017-04-13 NOTE — Telephone Encounter (Signed)
Lmtcb to go over lab results 

## 2017-04-14 ENCOUNTER — Encounter (HOSPITAL_COMMUNITY)
Admission: RE | Admit: 2017-04-14 | Discharge: 2017-04-14 | Disposition: A | Discharge status: Home / Self Care | Payer: Medicare Other | Source: Ambulatory Visit | Attending: Internal Medicine | Admitting: Internal Medicine

## 2017-04-14 ENCOUNTER — Ambulatory Visit
Admission: RE | Admit: 2017-04-14 | Discharge: 2017-04-14 | Disposition: A | Discharge status: Home / Self Care | Payer: Medicare Other | Source: Ambulatory Visit | Attending: Thoracic Surgery (Cardiothoracic Vascular Surgery) | Admitting: Thoracic Surgery (Cardiothoracic Vascular Surgery)

## 2017-04-14 VITALS — Ht 66.5 in | Wt 210.8 lb

## 2017-04-14 DIAGNOSIS — Z952 Presence of prosthetic heart valve: Secondary | ICD-10-CM

## 2017-04-14 DIAGNOSIS — Z953 Presence of xenogenic heart valve: Secondary | ICD-10-CM | POA: Diagnosis not present

## 2017-04-14 DIAGNOSIS — R918 Other nonspecific abnormal finding of lung field: Secondary | ICD-10-CM | POA: Diagnosis not present

## 2017-04-14 DIAGNOSIS — R911 Solitary pulmonary nodule: Secondary | ICD-10-CM

## 2017-04-14 DIAGNOSIS — Z48812 Encounter for surgical aftercare following surgery on the circulatory system: Secondary | ICD-10-CM | POA: Diagnosis not present

## 2017-04-19 ENCOUNTER — Encounter (HOSPITAL_COMMUNITY): Payer: Medicare Other

## 2017-05-08 NOTE — Addendum Note (Signed)
Encounter addended by: Jewel Baize, RD on: 05/08/2017  9:09 AM<BR>    Actions taken: Flowsheet data copied forward, Visit Navigator Flowsheet section accepted

## 2017-05-09 ENCOUNTER — Encounter (HOSPITAL_COMMUNITY): Payer: Self-pay | Admitting: *Deleted

## 2017-05-11 DIAGNOSIS — H25811 Combined forms of age-related cataract, right eye: Secondary | ICD-10-CM | POA: Diagnosis not present

## 2017-05-11 DIAGNOSIS — H25812 Combined forms of age-related cataract, left eye: Secondary | ICD-10-CM | POA: Diagnosis not present

## 2017-05-11 DIAGNOSIS — E119 Type 2 diabetes mellitus without complications: Secondary | ICD-10-CM | POA: Diagnosis not present

## 2017-05-15 ENCOUNTER — Other Ambulatory Visit: Payer: Medicare Other

## 2017-05-16 ENCOUNTER — Other Ambulatory Visit: Payer: Medicare Other

## 2017-05-16 DIAGNOSIS — I5042 Chronic combined systolic (congestive) and diastolic (congestive) heart failure: Secondary | ICD-10-CM

## 2017-05-17 ENCOUNTER — Telehealth: Payer: Self-pay | Admitting: *Deleted

## 2017-05-17 LAB — BASIC METABOLIC PANEL
BUN / CREAT RATIO: 12 (ref 10–24)
BUN: 21 mg/dL (ref 8–27)
CALCIUM: 9.4 mg/dL (ref 8.6–10.2)
CHLORIDE: 105 mmol/L (ref 96–106)
CO2: 27 mmol/L (ref 20–29)
Creatinine, Ser: 1.73 mg/dL — ABNORMAL HIGH (ref 0.76–1.27)
GFR calc Af Amer: 43 mL/min/{1.73_m2} — ABNORMAL LOW (ref >59)
GFR calc non Af Amer: 38 mL/min/{1.73_m2} — ABNORMAL LOW (ref >59)
GLUCOSE: 161 mg/dL — AB (ref 65–99)
Potassium: 4.4 mmol/L (ref 3.5–5.2)
Sodium: 143 mmol/L (ref 134–144)

## 2017-05-17 NOTE — Telephone Encounter (Signed)
DPR ok to s/w pt's son [name]. [name] has been notified of lab results for the pt by phone with verbal understanding. [name] thanked me for the call.

## 2017-05-17 NOTE — Telephone Encounter (Signed)
-----   Message from Liliane Shi, [place] sent at 05/17/2017  5:51 PM EDT ----- Please call the patient Kidney function is stable. Continue with current treatment plan. [name], PA-C   05/17/2017 5:51 PM

## 2017-05-24 ENCOUNTER — Ambulatory Visit (INDEPENDENT_AMBULATORY_CARE_PROVIDER_SITE_OTHER): Payer: Medicare Other | Admitting: *Deleted

## 2017-05-24 DIAGNOSIS — I442 Atrioventricular block, complete: Secondary | ICD-10-CM | POA: Diagnosis not present

## 2017-05-24 NOTE — Progress Notes (Signed)
Remote pacemaker transmission.   

## 2017-05-25 LAB — CUP PACEART REMOTE DEVICE CHECK
Battery Voltage: 3.09 V
Brady Statistic AP VP Percent: 59.82 %
Brady Statistic RA Percent Paced: 59.69 %
Brady Statistic RV Percent Paced: 100 %
Implantable Lead Implant Date: 20180409
Implantable Lead Location: 753859
Implantable Lead Location: 753860
Implantable Pulse Generator Implant Date: 20180409
Lead Channel Impedance Value: 361 Ohm
Lead Channel Impedance Value: 399 Ohm
Lead Channel Impedance Value: 437 Ohm
Lead Channel Pacing Threshold Amplitude: 0.875 V
Lead Channel Pacing Threshold Pulse Width: 0.4 ms
Lead Channel Setting Pacing Amplitude: 2 V
Lead Channel Setting Sensing Sensitivity: 4 mV
MDC IDC LEAD IMPLANT DT: 20180409
MDC IDC MSMT BATTERY REMAINING LONGEVITY: 127 mo
MDC IDC MSMT LEADCHNL RA IMPEDANCE VALUE: 247 Ohm
MDC IDC MSMT LEADCHNL RA SENSING INTR AMPL: 1.5 mV
MDC IDC MSMT LEADCHNL RA SENSING INTR AMPL: 1.5 mV
MDC IDC MSMT LEADCHNL RV PACING THRESHOLD AMPLITUDE: 0.625 V
MDC IDC MSMT LEADCHNL RV PACING THRESHOLD PULSEWIDTH: 0.4 ms
MDC IDC MSMT LEADCHNL RV SENSING INTR AMPL: 15.375 mV
MDC IDC MSMT LEADCHNL RV SENSING INTR AMPL: 16.75 mV
MDC IDC SESS DTM: 20181010050926
MDC IDC SET LEADCHNL RV PACING AMPLITUDE: 2.5 V
MDC IDC SET LEADCHNL RV PACING PULSEWIDTH: 0.4 ms
MDC IDC STAT BRADY AP VS PERCENT: 0 %
MDC IDC STAT BRADY AS VP PERCENT: 40.18 %
MDC IDC STAT BRADY AS VS PERCENT: 0 %

## 2017-05-26 ENCOUNTER — Encounter: Payer: Self-pay | Admitting: Cardiology

## 2017-05-29 ENCOUNTER — Encounter (HOSPITAL_COMMUNITY)
Admission: RE | Admit: 2017-05-29 | Discharge: 2017-05-29 | Disposition: A | Discharge status: Home / Self Care | Payer: Self-pay | Source: Ambulatory Visit | Attending: Internal Medicine | Admitting: Internal Medicine

## 2017-05-29 DIAGNOSIS — Z953 Presence of xenogenic heart valve: Secondary | ICD-10-CM | POA: Insufficient documentation

## 2017-05-31 ENCOUNTER — Encounter (HOSPITAL_COMMUNITY): Payer: Self-pay

## 2017-05-31 DIAGNOSIS — H25812 Combined forms of age-related cataract, left eye: Secondary | ICD-10-CM | POA: Diagnosis not present

## 2017-05-31 DIAGNOSIS — H2512 Age-related nuclear cataract, left eye: Secondary | ICD-10-CM | POA: Diagnosis not present

## 2017-06-02 ENCOUNTER — Encounter (HOSPITAL_COMMUNITY)
Admission: RE | Admit: 2017-06-02 | Discharge: 2017-06-02 | Disposition: A | Discharge status: Home / Self Care | Payer: Self-pay | Source: Ambulatory Visit | Attending: Internal Medicine | Admitting: Internal Medicine

## 2017-06-05 ENCOUNTER — Encounter (HOSPITAL_COMMUNITY)
Admission: RE | Admit: 2017-06-05 | Discharge: 2017-06-05 | Disposition: A | Discharge status: Home / Self Care | Payer: Self-pay | Source: Ambulatory Visit | Attending: Internal Medicine | Admitting: Internal Medicine

## 2017-06-07 ENCOUNTER — Encounter (HOSPITAL_COMMUNITY): Payer: Self-pay

## 2017-06-09 ENCOUNTER — Encounter (HOSPITAL_COMMUNITY)
Admission: RE | Admit: 2017-06-09 | Discharge: 2017-06-09 | Disposition: A | Discharge status: Home / Self Care | Payer: Self-pay | Source: Ambulatory Visit | Attending: Internal Medicine | Admitting: Internal Medicine

## 2017-06-09 DIAGNOSIS — I10 Essential (primary) hypertension: Secondary | ICD-10-CM | POA: Diagnosis not present

## 2017-06-09 DIAGNOSIS — N2889 Other specified disorders of kidney and ureter: Secondary | ICD-10-CM | POA: Diagnosis not present

## 2017-06-09 DIAGNOSIS — D649 Anemia, unspecified: Secondary | ICD-10-CM | POA: Diagnosis not present

## 2017-06-09 DIAGNOSIS — N183 Chronic kidney disease, stage 3 (moderate): Secondary | ICD-10-CM | POA: Diagnosis not present

## 2017-06-09 DIAGNOSIS — Z952 Presence of prosthetic heart valve: Secondary | ICD-10-CM | POA: Diagnosis not present

## 2017-06-09 DIAGNOSIS — N281 Cyst of kidney, acquired: Secondary | ICD-10-CM | POA: Diagnosis not present

## 2017-06-09 DIAGNOSIS — E1122 Type 2 diabetes mellitus with diabetic chronic kidney disease: Secondary | ICD-10-CM | POA: Diagnosis not present

## 2017-06-09 DIAGNOSIS — E7800 Pure hypercholesterolemia, unspecified: Secondary | ICD-10-CM | POA: Diagnosis not present

## 2017-06-09 DIAGNOSIS — F0390 Unspecified dementia without behavioral disturbance: Secondary | ICD-10-CM | POA: Diagnosis not present

## 2017-06-12 ENCOUNTER — Encounter (HOSPITAL_COMMUNITY): Payer: Self-pay

## 2017-06-14 ENCOUNTER — Encounter (HOSPITAL_COMMUNITY): Payer: Self-pay

## 2017-06-16 ENCOUNTER — Encounter (HOSPITAL_COMMUNITY)
Admission: RE | Admit: 2017-06-16 | Discharge: 2017-06-16 | Disposition: A | Discharge status: Home / Self Care | Payer: Self-pay | Source: Ambulatory Visit | Attending: Internal Medicine | Admitting: Internal Medicine

## 2017-06-16 DIAGNOSIS — Z953 Presence of xenogenic heart valve: Secondary | ICD-10-CM | POA: Insufficient documentation

## 2017-06-19 ENCOUNTER — Encounter (HOSPITAL_COMMUNITY)
Admission: RE | Admit: 2017-06-19 | Discharge: 2017-06-19 | Disposition: A | Discharge status: Home / Self Care | Payer: Self-pay | Source: Ambulatory Visit | Attending: Internal Medicine | Admitting: Internal Medicine

## 2017-06-20 ENCOUNTER — Other Ambulatory Visit: Payer: Self-pay | Admitting: Urology

## 2017-06-20 DIAGNOSIS — D3000 Benign neoplasm of unspecified kidney: Secondary | ICD-10-CM

## 2017-06-21 ENCOUNTER — Encounter (HOSPITAL_COMMUNITY)
Admission: RE | Admit: 2017-06-21 | Discharge: 2017-06-21 | Disposition: A | Discharge status: Home / Self Care | Payer: Self-pay | Source: Ambulatory Visit | Attending: Internal Medicine | Admitting: Internal Medicine

## 2017-06-23 ENCOUNTER — Encounter (HOSPITAL_COMMUNITY)
Admission: RE | Admit: 2017-06-23 | Discharge: 2017-06-23 | Disposition: A | Discharge status: Home / Self Care | Payer: Self-pay | Source: Ambulatory Visit | Attending: Internal Medicine | Admitting: Internal Medicine

## 2017-06-26 ENCOUNTER — Encounter (HOSPITAL_COMMUNITY)
Admission: RE | Admit: 2017-06-26 | Discharge: 2017-06-26 | Disposition: A | Discharge status: Home / Self Care | Payer: Self-pay | Source: Ambulatory Visit | Attending: Internal Medicine | Admitting: Internal Medicine

## 2017-06-28 ENCOUNTER — Encounter (HOSPITAL_COMMUNITY): Payer: Self-pay

## 2017-06-30 ENCOUNTER — Encounter (HOSPITAL_COMMUNITY)
Admission: RE | Admit: 2017-06-30 | Discharge: 2017-06-30 | Disposition: A | Discharge status: Home / Self Care | Payer: Self-pay | Source: Ambulatory Visit | Attending: Internal Medicine | Admitting: Internal Medicine

## 2017-07-03 ENCOUNTER — Encounter (HOSPITAL_COMMUNITY)
Admission: RE | Admit: 2017-07-03 | Discharge: 2017-07-03 | Disposition: A | Discharge status: Home / Self Care | Payer: Self-pay | Source: Ambulatory Visit | Attending: Internal Medicine | Admitting: Internal Medicine

## 2017-07-03 DIAGNOSIS — Z1211 Encounter for screening for malignant neoplasm of colon: Secondary | ICD-10-CM | POA: Diagnosis not present

## 2017-07-03 DIAGNOSIS — D649 Anemia, unspecified: Secondary | ICD-10-CM | POA: Diagnosis not present

## 2017-07-03 DIAGNOSIS — H2511 Age-related nuclear cataract, right eye: Secondary | ICD-10-CM | POA: Diagnosis not present

## 2017-07-05 ENCOUNTER — Encounter (HOSPITAL_COMMUNITY): Payer: Self-pay

## 2017-07-05 DIAGNOSIS — H25811 Combined forms of age-related cataract, right eye: Secondary | ICD-10-CM | POA: Diagnosis not present

## 2017-07-05 DIAGNOSIS — H2511 Age-related nuclear cataract, right eye: Secondary | ICD-10-CM | POA: Diagnosis not present

## 2017-07-10 ENCOUNTER — Encounter (HOSPITAL_COMMUNITY)
Admission: RE | Admit: 2017-07-10 | Discharge: 2017-07-10 | Disposition: A | Discharge status: Home / Self Care | Payer: Self-pay | Source: Ambulatory Visit | Attending: Internal Medicine | Admitting: Internal Medicine

## 2017-07-12 ENCOUNTER — Encounter (HOSPITAL_COMMUNITY): Payer: Self-pay

## 2017-07-14 ENCOUNTER — Encounter (HOSPITAL_COMMUNITY)
Admission: RE | Admit: 2017-07-14 | Discharge: 2017-07-14 | Disposition: A | Discharge status: Home / Self Care | Payer: Self-pay | Source: Ambulatory Visit | Attending: Internal Medicine | Admitting: Internal Medicine

## 2017-07-17 ENCOUNTER — Encounter (HOSPITAL_COMMUNITY)
Admission: RE | Admit: 2017-07-17 | Discharge: 2017-07-17 | Disposition: A | Discharge status: Home / Self Care | Payer: Self-pay | Source: Ambulatory Visit | Attending: Internal Medicine | Admitting: Internal Medicine

## 2017-07-17 DIAGNOSIS — Z953 Presence of xenogenic heart valve: Secondary | ICD-10-CM | POA: Insufficient documentation

## 2017-07-19 ENCOUNTER — Encounter (HOSPITAL_COMMUNITY)
Admission: RE | Admit: 2017-07-19 | Discharge: 2017-07-19 | Disposition: A | Discharge status: Home / Self Care | Payer: Self-pay | Source: Ambulatory Visit | Attending: Internal Medicine | Admitting: Internal Medicine

## 2017-07-21 ENCOUNTER — Encounter (HOSPITAL_COMMUNITY)
Admission: RE | Admit: 2017-07-21 | Discharge: 2017-07-21 | Disposition: A | Discharge status: Home / Self Care | Payer: Medicare Other | Source: Ambulatory Visit | Attending: Internal Medicine | Admitting: Internal Medicine

## 2017-07-24 ENCOUNTER — Encounter (HOSPITAL_COMMUNITY): Payer: Self-pay

## 2017-07-24 ENCOUNTER — Ambulatory Visit (HOSPITAL_COMMUNITY)
Admission: RE | Admit: 2017-07-24 | Discharge: 2017-07-24 | Disposition: A | Discharge status: Home / Self Care | Payer: Medicare Other | Source: Ambulatory Visit | Attending: Urology | Admitting: Urology

## 2017-07-26 ENCOUNTER — Encounter (HOSPITAL_COMMUNITY): Payer: Self-pay

## 2017-07-27 ENCOUNTER — Ambulatory Visit: Payer: Medicare Other | Admitting: Internal Medicine

## 2017-07-27 NOTE — Progress Notes (Deleted)
Follow-up Outpatient Visit Date: 07/27/2017  Primary Care Provider: [name], MD [address] [place] [address]  Chief Complaint: ***  HPI:  [name] is a 76 y.o. year-old male with history of nonobstructive coronary artery disease, moderate aortic stenosis status post bioprosthetic aortic valve replacement (11/2016), left atrial mass status post excision (11/2016): TIA, complete heart block status post pacemaker, hypertension, hyperlipidemia, mild carotid artery stenosis, chronic kidney disease, prediabetes, prostate cancer, and mild cognitive impairment, who presents for follow-up of hronic systolic heart failure and structural heart disease. Last saw him in June, at which time he was recovering from his cardiac surgery in April. He was most recently seen in our office in August by [name], PA, at which time doing well without chest pain or significant shortness of breath. He complained only of an intermittent mild cough as well as occasional incisional soreness. He was switched from valsartan to losartan at that visit due to medication recall.  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 LAD with minimal diffuse disease. LCx with mild diffuse disease and 40% stenosis involving OM 3. RCA with 50% proximal/mid vessel stenosis. Mild to moderate aortic stenosis with mean aortic valve gradient of 16-22 mmHg. RA 4, RV 35/5,  PA 35/10 (19), and PCWP 9. LVEDP 10. AO sat 91%. PA sat 67%. Fick CO/CI 7.6/3.6. PVR 1.3.  CV Surgery:  Left atrial mass resection and bioprosthetic aortic valve replacement (12/02/16, Dr. Roxy Manns): Benign dystrophic fibrotic tissue with calcification attached to the posterior mitral annulus by a thin fibrotic stalk. AVR with Edwards Intuity Elite rapid deployment 23 mm bovine pericardial tissue valve.  EP Procedures and Devices:  Dual chamber pacemaker (11/21/16, Dr. Curt Bears): Medtronic Azure XT DR MRI SureScan pacemaker  Non-Invasive Evaluation(s):  TTE (01/16/17): LVEF 40-45% with inferior and inferoseptal hypokinesis. Normal LV size with mild LVH. Grade 1 diastolic dysfunction. Mitral and or calcification with mildly thickened leaflets. Bioprosthetic aortic valve present with mean gradient of 16 mmHg. Severely dilated left atrium. Mildly reduced RV function.  TEE (11/23/16): Normal LV size with severe LVH. LVEF 60-65%. Decreased cusp separation of the aortic valve with restricted mobility of the non-coronary cusp. Mitral annular calcification with leaflet thickening noted. 16 x 4 mm mobile calcified echodensity on the atrial basal surface of the posterior mitral valve leaflet noted. Mild MR. No left atrial appendage thrombus.  TTE (11/20/16): Normal LV size with mild LVH. LVEF 65-70% with normal wall motion. Grade 1 diastolic dysfunction. Moderate aortic stenosis (mean gradient 26 mmHg). Severely calcified aortic annulus with severe valve thickening and moderate calcification. Moderate mitral stenosis. Mild left atrial enlargement. Normal RV size and function. Mild TR.  Recent CV Pertinent Labs: Lab Results  Component Value Date   CHOL 106 11/19/2016   HDL 27 (L) 11/19/2016   LDLCALC 60 11/19/2016   TRIG 97 11/19/2016   CHOLHDL 3.9 11/19/2016   INR 1.53 12/02/2016   K 4.4 05/16/2017   MG 2.5 (H) 12/03/2016   BUN 21 05/16/2017   CREATININE 1.73 (H) 05/16/2017    Past medical and surgical  history were reviewed and updated in EPIC.  No outpatient medications have been marked as taking for the 07/27/17 encounter (Appointment) with Christos Mixson, [name], MD.    Allergies: Patient has no known allergies.  Social History   Socioeconomic History  . Marital status: Widowed    Spouse name: Not on file  . Number of children: 2  . Years of education: 5  . Highest education level: Not on file  Social Needs  . Financial resource strain: Not on file  . Food insecurity - worry: Not on file  . Food insecurity - inability: Not on file  . Transportation needs - medical: Not on file  . Transportation needs - non-medical: Not on file  Occupational History    Comment: retired  Tobacco Use  . Smoking status: Former Smoker    Packs/day: 0.25    Types: Cigarettes    Last attempt to quit: 11/16/2016    Years since quitting: 0.6  . Smokeless tobacco: Never Used  Substance and Sexual Activity  . Alcohol use: No  . Drug use: No  . Sexual activity: Not Currently  Other Topics Concern  . Not on file  Social History Narrative   Lives alone   Caffeine     Family History  Problem Relation Age of Onset  . Cancer Father     Review of Systems: A 12-system review of systems was performed and was negative except as noted in the HPI.  --------------------------------------------------------------------------------------------------  Physical Exam: There were no vitals taken for this visit.  General:  *** HEENT: No conjunctival pallor or scleral icterus. Moist mucous membranes.  OP clear. Neck: Supple without lymphadenopathy, thyromegaly, JVD, or HJR. No carotid bruit. Lungs: Normal work of breathing. Clear to auscultation bilaterally without wheezes or crackles. Heart: Regular rate and rhythm without murmurs, rubs, or gallops. Non-displaced PMI. Abd: Bowel sounds present. Soft, NT/ND without hepatosplenomegaly Ext: No lower extremity edema. Radial, PT, and DP pulses are 2+  bilaterally. Skin: Warm and dry without rash.  EKG:  ***  Lab Results  Component Value Date   WBC 9.8 12/14/2016   HGB 8.8 (A) 12/14/2016   HCT 27 (A) 12/14/2016   MCV 88.6 12/08/2016   PLT 244 12/14/2016    Lab Results  Component Value Date   NA 143 05/16/2017   K 4.4 05/16/2017   CL 105 05/16/2017   CO2 27 05/16/2017   BUN 21 05/16/2017   CREATININE 1.73 (H) 05/16/2017   GLUCOSE 161 (H) 05/16/2017   ALT 18 12/05/2016    Lab Results  Component Value Date   CHOL 106 11/19/2016   HDL 27 (L) 11/19/2016   LDLCALC 60 11/19/2016   TRIG 97 11/19/2016   CHOLHDL 3.9 11/19/2016    --------------------------------------------------------------------------------------------------  ASSESSMENT AND PLAN: [name] Tanay Misuraca, MD 07/27/2017 9:45 AM

## 2017-07-28 ENCOUNTER — Encounter (HOSPITAL_COMMUNITY): Payer: Self-pay

## 2017-07-28 ENCOUNTER — Encounter: Payer: Self-pay | Admitting: Internal Medicine

## 2017-07-31 ENCOUNTER — Encounter (HOSPITAL_COMMUNITY)
Admission: RE | Admit: 2017-07-31 | Discharge: 2017-07-31 | Disposition: A | Discharge status: Home / Self Care | Payer: Self-pay | Source: Ambulatory Visit | Attending: Internal Medicine | Admitting: Internal Medicine

## 2017-08-02 ENCOUNTER — Encounter (HOSPITAL_COMMUNITY)
Admission: RE | Admit: 2017-08-02 | Discharge: 2017-08-02 | Disposition: A | Discharge status: Home / Self Care | Payer: Medicare Other | Source: Ambulatory Visit | Attending: Internal Medicine | Admitting: Internal Medicine

## 2017-08-04 ENCOUNTER — Encounter (HOSPITAL_COMMUNITY)
Admission: RE | Admit: 2017-08-04 | Discharge: 2017-08-04 | Disposition: A | Discharge status: Home / Self Care | Payer: Self-pay | Source: Ambulatory Visit | Attending: Internal Medicine | Admitting: Internal Medicine

## 2017-08-09 ENCOUNTER — Encounter (HOSPITAL_COMMUNITY): Payer: Self-pay

## 2017-08-09 ENCOUNTER — Ambulatory Visit (HOSPITAL_COMMUNITY)
Admission: RE | Admit: 2017-08-09 | Discharge: 2017-08-09 | Disposition: A | Discharge status: Home / Self Care | Payer: Medicare Other | Source: Ambulatory Visit | Attending: Urology | Admitting: Urology

## 2017-08-09 DIAGNOSIS — N2889 Other specified disorders of kidney and ureter: Secondary | ICD-10-CM | POA: Insufficient documentation

## 2017-08-09 DIAGNOSIS — C649 Malignant neoplasm of unspecified kidney, except renal pelvis: Secondary | ICD-10-CM | POA: Diagnosis not present

## 2017-08-09 DIAGNOSIS — D3000 Benign neoplasm of unspecified kidney: Secondary | ICD-10-CM | POA: Diagnosis present

## 2017-08-09 DIAGNOSIS — N281 Cyst of kidney, acquired: Secondary | ICD-10-CM | POA: Diagnosis not present

## 2017-08-09 LAB — CREATININE, SERUM
CREATININE: 1.64 mg/dL — AB (ref 0.61–1.24)
GFR calc Af Amer: 45 mL/min — ABNORMAL LOW (ref >60)
GFR calc non Af Amer: 39 mL/min — ABNORMAL LOW (ref >60)

## 2017-08-09 MED ORDER — GADOBENATE DIMEGLUMINE 529 MG/ML IV SOLN
20.0000 mL | Freq: Once | INTRAVENOUS | Status: AC | PRN
  Administered 2017-08-09: 20 mL via INTRAVENOUS

## 2017-08-10 ENCOUNTER — Encounter: Payer: Self-pay | Admitting: Internal Medicine

## 2017-08-11 ENCOUNTER — Encounter (HOSPITAL_COMMUNITY)
Admission: RE | Admit: 2017-08-11 | Discharge: 2017-08-11 | Disposition: A | Discharge status: Home / Self Care | Payer: Self-pay | Source: Ambulatory Visit | Attending: Internal Medicine | Admitting: Internal Medicine

## 2017-08-14 ENCOUNTER — Encounter (HOSPITAL_COMMUNITY): Payer: Self-pay

## 2017-08-16 ENCOUNTER — Encounter (HOSPITAL_COMMUNITY)
Admission: RE | Admit: 2017-08-16 | Discharge: 2017-08-16 | Disposition: A | Discharge status: Home / Self Care | Payer: Self-pay | Source: Ambulatory Visit | Attending: Internal Medicine | Admitting: Internal Medicine

## 2017-08-16 DIAGNOSIS — Z953 Presence of xenogenic heart valve: Secondary | ICD-10-CM | POA: Insufficient documentation

## 2017-08-18 ENCOUNTER — Encounter (HOSPITAL_COMMUNITY)
Admission: RE | Admit: 2017-08-18 | Discharge: 2017-08-18 | Disposition: A | Discharge status: Home / Self Care | Payer: Self-pay | Source: Ambulatory Visit | Attending: Internal Medicine | Admitting: Internal Medicine

## 2017-08-21 ENCOUNTER — Encounter (HOSPITAL_COMMUNITY)
Admission: RE | Admit: 2017-08-21 | Discharge: 2017-08-21 | Disposition: A | Discharge status: Home / Self Care | Payer: Self-pay | Source: Ambulatory Visit | Attending: Internal Medicine | Admitting: Internal Medicine

## 2017-08-21 ENCOUNTER — Other Ambulatory Visit: Payer: Self-pay | Admitting: Internal Medicine

## 2017-08-21 NOTE — Telephone Encounter (Signed)
Please review for refill. Thanks!  

## 2017-08-23 ENCOUNTER — Encounter (HOSPITAL_COMMUNITY)
Admission: RE | Admit: 2017-08-23 | Discharge: 2017-08-23 | Disposition: A | Discharge status: Home / Self Care | Payer: Self-pay | Source: Ambulatory Visit | Attending: Internal Medicine | Admitting: Internal Medicine

## 2017-08-23 ENCOUNTER — Ambulatory Visit (INDEPENDENT_AMBULATORY_CARE_PROVIDER_SITE_OTHER): Payer: Medicare Other | Admitting: *Deleted

## 2017-08-23 DIAGNOSIS — I442 Atrioventricular block, complete: Secondary | ICD-10-CM

## 2017-08-23 NOTE — Progress Notes (Signed)
Remote pacemaker transmission.   

## 2017-08-24 ENCOUNTER — Encounter: Payer: Self-pay | Admitting: Cardiology

## 2017-08-24 ENCOUNTER — Ambulatory Visit (INDEPENDENT_AMBULATORY_CARE_PROVIDER_SITE_OTHER): Payer: Medicare Other | Admitting: Internal Medicine

## 2017-08-24 ENCOUNTER — Encounter: Payer: Self-pay | Admitting: Internal Medicine

## 2017-08-24 VITALS — BP 156/76 | HR 66 | Ht 66.5 in | Wt 212.0 lb

## 2017-08-24 DIAGNOSIS — Z952 Presence of prosthetic heart valve: Secondary | ICD-10-CM | POA: Diagnosis not present

## 2017-08-24 DIAGNOSIS — I5022 Chronic systolic (congestive) heart failure: Secondary | ICD-10-CM | POA: Diagnosis not present

## 2017-08-24 DIAGNOSIS — I38 Endocarditis, valve unspecified: Secondary | ICD-10-CM | POA: Diagnosis not present

## 2017-08-24 DIAGNOSIS — I5189 Other ill-defined heart diseases: Secondary | ICD-10-CM

## 2017-08-24 DIAGNOSIS — I428 Other cardiomyopathies: Secondary | ICD-10-CM | POA: Diagnosis not present

## 2017-08-24 DIAGNOSIS — I519 Heart disease, unspecified: Secondary | ICD-10-CM | POA: Diagnosis not present

## 2017-08-24 DIAGNOSIS — N183 Chronic kidney disease, stage 3 unspecified: Secondary | ICD-10-CM

## 2017-08-24 DIAGNOSIS — I2510 Atherosclerotic heart disease of native coronary artery without angina pectoris: Secondary | ICD-10-CM

## 2017-08-24 DIAGNOSIS — I10 Essential (primary) hypertension: Secondary | ICD-10-CM | POA: Diagnosis not present

## 2017-08-24 DIAGNOSIS — I442 Atrioventricular block, complete: Secondary | ICD-10-CM | POA: Diagnosis not present

## 2017-08-24 LAB — CUP PACEART REMOTE DEVICE CHECK
Battery Remaining Longevity: 119 mo
Brady Statistic AP VS Percent: 0.01 %
Brady Statistic RA Percent Paced: 60.28 %
Brady Statistic RV Percent Paced: 99.98 %
Date Time Interrogation Session: 20190109051258
Implantable Lead Implant Date: 20180409
Implantable Lead Location: 753859
Implantable Lead Location: 753860
Implantable Lead Model: 5076
Implantable Pulse Generator Implant Date: 20180409
Lead Channel Impedance Value: 418 Ohm
Lead Channel Pacing Threshold Pulse Width: 0.4 ms
Lead Channel Sensing Intrinsic Amplitude: 1.625 mV
Lead Channel Sensing Intrinsic Amplitude: 16.75 mV
Lead Channel Setting Pacing Amplitude: 2.5 V
Lead Channel Setting Pacing Pulse Width: 0.4 ms
Lead Channel Setting Sensing Sensitivity: 2.8 mV
MDC IDC LEAD IMPLANT DT: 20180409
MDC IDC MSMT BATTERY VOLTAGE: 3.04 V
MDC IDC MSMT LEADCHNL RA IMPEDANCE VALUE: 247 Ohm
MDC IDC MSMT LEADCHNL RA IMPEDANCE VALUE: 399 Ohm
MDC IDC MSMT LEADCHNL RA PACING THRESHOLD AMPLITUDE: 1.125 V
MDC IDC MSMT LEADCHNL RA SENSING INTR AMPL: 1.625 mV
MDC IDC MSMT LEADCHNL RV IMPEDANCE VALUE: 342 Ohm
MDC IDC MSMT LEADCHNL RV PACING THRESHOLD AMPLITUDE: 0.625 V
MDC IDC MSMT LEADCHNL RV PACING THRESHOLD PULSEWIDTH: 0.4 ms
MDC IDC MSMT LEADCHNL RV SENSING INTR AMPL: 15.375 mV
MDC IDC SET LEADCHNL RA PACING AMPLITUDE: 2.25 V
MDC IDC STAT BRADY AP VP PERCENT: 60.36 %
MDC IDC STAT BRADY AS VP PERCENT: 39.62 %
MDC IDC STAT BRADY AS VS PERCENT: 0.01 %

## 2017-08-24 MED ORDER — AMOXICILLIN 500 MG PO TABS: ORAL_TABLET | ORAL | 1 refills | Status: AC

## 2017-08-24 NOTE — Progress Notes (Signed)
Follow-up Outpatient Visit Date: 08/24/2017  Primary Care Provider: [name], MD [address] [place] [address]  Chief Complaint: Follow-up valvular heart disease and cardiomyopathy  HPI:  James Curtis is a 77 y.o. year-old male with history of nonobstructive coronary artery disease, moderate aortic stenosis status post bioprosthetic aortic valve replacement (11/2016), left atrial mass status post excision (11/2016): TIA, complete heart block status post pacemaker, hypertension, hyperlipidemia, mild carotid artery stenosis, chronic kidney disease, prediabetes, prostate cancer, and mild cognitive impairment, who presents for follow-up of chronic systolic heart failure and structural heart disease. I last saw him in June, at which time he was recovering from his cardiac surgery in April. He was most recently seen in our office in August by [name], PA, at which time doing well without chest pain or significant shortness of breath. He complained only of an intermittent mild cough as well as occasional incisional soreness. He was switched from valsartan to losartan at that visit due to medication recall. He was scheduled to follow-up with me last month but missed his appointment.  Today, James Curtis reports feeling well. However, most of the history if provided by his son. The patient has not had any chest pain, shortness of breath, palpitations, or lightheadedness. Residual stroke deficits have resolved, and both the patient and his son feel like James Curtis is back to his baseline. The patient's son is concerned about renal insufficiency and gradual memory loss. James Curtis is undergoing workup of renal mass by urology. He has not seen a nephrologist. Since his hospitalization this past spring, his renal function has been stable but moderately reduced with a GFR around 45. The patient's son has done research on the internet and is very concerned that statins and ARBs  may be contributing to his father's kidney and memory issues. He believes that minimizing medications will resolve this issues, as James Curtis was doing better several years ago before simvastatin and subsequently valsartan/losartan were added. 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 LAD with minimal diffuse disease. LCx with mild diffuse disease and 40% stenosis involving OM 3. RCA with 50% proximal/mid vessel stenosis. Mild to moderate aortic stenosis with mean aortic valve gradient of 16-22 mmHg. RA 4, RV 35/5, PA 35/10 (19), and PCWP 9. LVEDP 10. AO sat 91%. PA sat 67%. Fick CO/CI 7.6/3.6. PVR 1.3.  CV Surgery:  Left atrial mass resection and bioprosthetic aortic valve replacement (12/02/16, Dr. Roxy Manns): Benign dystrophic fibrotic tissue with calcification attached to the posterior mitral annulus by a thin fibrotic stalk. AVR with Edwards Intuity Elite rapid deployment 23 mm bovine pericardial tissue valve.  EP Procedures and Devices:  Dual chamber pacemaker (11/21/16, Dr. Curt Bears): Medtronic Azure XT DR MRI SureScan pacemaker  Non-Invasive Evaluation(s):  TTE (01/16/17): LVEF 40-45% with inferior and inferoseptal hypokinesis. Normal LV size with mild LVH. Grade 1 diastolic dysfunction. Mitral and or calcification with mildly thickened leaflets. Bioprosthetic aortic valve present with mean gradient of 16 mmHg. Severely dilated left atrium. Mildly reduced RV  function.  TEE (11/23/16): Normal LV size with severe LVH. LVEF 60-65%. Decreased cusp separation of the aortic valve with  restricted mobility of the non-coronary cusp. Mitral annular calcification with leaflet thickening noted. 16 x 4 mm mobile calcified echodensity on the atrial basal surface of the posterior mitral valve leaflet noted. Mild MR. No left atrial appendage thrombus.  TTE (11/20/16): Normal LV size with mild LVH. LVEF 65-70% with normal wall motion. Grade 1 diastolic dysfunction. Moderate aortic stenosis (mean gradient 26 mmHg). Severely calcified aortic annulus with severe valve thickening and moderate calcification. Moderate mitral stenosis. Mild left atrial enlargement. Normal RV size and function. Mild TR.  Recent CV Pertinent Labs: Lab Results  Component Value Date   CHOL 106 11/19/2016   HDL 27 (L) 11/19/2016   LDLCALC 60 11/19/2016   TRIG 97 11/19/2016   CHOLHDL 3.9 11/19/2016   INR 1.53 12/02/2016   K 4.4 05/16/2017   MG 2.5 (H) 12/03/2016   BUN 21 05/16/2017   CREATININE 1.64 (H) 08/09/2017    Past medical and surgical history were reviewed and updated in EPIC.  Current Meds  Medication Sig  . amLODipine (NORVASC) 10 MG tablet Take 10 mg by mouth daily.  Marland Kitchen Apoaequorin (PREVAGEN PO) Take 1 tablet by mouth daily. For Brain and Memory Health - OTC  . aspirin EC 81 MG tablet Take 81 mg by mouth daily.  . carvedilol (COREG) 6.25 MG tablet TAKE 1 TABLET BY MOUTH TWICE A DAY  . cholecalciferol (VITAMIN D) 1000 units tablet Take 1,000 Units by mouth daily.  . iron polysaccharides (NIFEREX) 150 MG capsule Take 1 capsule (150 mg total) by mouth daily.  . simvastatin (ZOCOR) 20 MG tablet Take 20 mg by mouth at bedtime.   . Tetrahydrozoline HCl (VISINE OP) Apply to eye as directed.    Allergies: Patient has no known allergies.  Social History   Socioeconomic History  . Marital status: Widowed    Spouse name: Not on file  . Number of children: 2  . Years of  education: 32  . Highest education level: Not on file  Social Needs  . Financial resource strain: Not on file  . Food insecurity - worry: Not on file  . Food insecurity - inability: Not on file  . Transportation needs - medical: Not on file  . Transportation needs - non-medical: Not on file  Occupational History    Comment: retired  Tobacco Use  . Smoking status: Former Smoker    Packs/day: 0.25    Types: Cigarettes    Last attempt to quit: 11/16/2016    Years since quitting: 0.7  . Smokeless tobacco: Never Used  Substance and Sexual Activity  . Alcohol use: No  . Drug use: No  . Sexual activity: Not Currently  Other Topics Concern  . Not on file  Social History Narrative   Lives alone   Caffeine     Family History  Problem Relation Age of Onset  . Cancer Father     Review of Systems: James Curtis notes occasional left shoulder stiffness near his pacemaker pocked. Otherwise, a 12-system review of systems was performed and was negative except as noted in the HPI.  --------------------------------------------------------------------------------------------------  Physical Exam: BP (!) 156/76 (BP Location: Left Arm, Patient Position: Sitting, Cuff Size: Normal)   Pulse 66   Ht 5' 6.5" (1.689 m)   Wt 212 lb (96.2 kg)   SpO2 96%   BMI 33.71 kg/m   General:  Obese man, seated comfortably in the exam room. He is accompanied by his son. HEENT: No conjunctival pallor or scleral icterus. Moist mucous membranes.  OP clear. Neck: Supple without lymphadenopathy, thyromegaly, JVD, or HJR.  Lungs: Normal work of breathing. Clear to auscultation bilaterally without wheezes or crackles. Heart: Regular rate and rhythm with 1/6 systolic murmur. No rubs or gallops. Abd: Bowel sounds present. Soft, NT/ND without hepatosplenomegaly Ext: No lower extremity edema. Radial, PT, and DP pulses are 2+ bilaterally. Skin: Warm and dry without rash.  Lab Results  Component Value Date   WBC  9.8 12/14/2016   HGB 8.8 (A) 12/14/2016   HCT 27 (A) 12/14/2016   MCV 88.6 12/08/2016   PLT 244 12/14/2016    Lab Results  Component Value Date   NA 143 05/16/2017   K 4.4 05/16/2017   CL 105 05/16/2017   CO2 27 05/16/2017   BUN 21 05/16/2017   CREATININE 1.64 (H) 08/09/2017   GLUCOSE 161 (H) 05/16/2017   ALT 18 12/05/2016    Lab Results  Component Value Date   CHOL 106 11/19/2016   HDL 27 (L) 11/19/2016   LDLCALC 60 11/19/2016   TRIG 97 11/19/2016   CHOLHDL 3.9 11/19/2016    --------------------------------------------------------------------------------------------------  ASSESSMENT AND PLAN: Aortic stenosis status post bioprosthetic AVR James Curtis has recovered well from aortic valve replacement in 11/2016. He will continue with indefinite aspirin. I have also counseling him about the importance of antibiotic prophylaxis before any dental procedures. Prescription for amoxicillin 2 gm PO x 1 30-60 min before dental cleaning/procedures has been provided.  Chronic systolic heart failure secondary to non-ischemic cardiomyopathy James Curtis appears euvolemic and well-compensated. We will continue his current doses of carvedilol and losartan. He will need better blood pressure control, though we will defer medication changes today (as outlined below).  Left atrial mass No new neurologic events follow resection (presented with stoke - cardioembolic).  Complete heart block Continue routine device follow-up.  Non-obstructive coronary artery disease Continue aspirin. Medical therapy and risk factor modification discussed at length. Given concern for memory issues and renal dysfunction, James Curtis and his son would like to try a statin holiday. I have instructed them to hold simvastatin so that we can reassess his symptoms when he follows up in 1 month.  Hypertension Blood pressure is poorly controlled today despite being on max doses of losartan and amlodopine as well  as carvedilol 6.25 mg BID. We discussed medication and lifestyle modifications in order to better control James Curtis blood pressure to prevent further strokes and worsening heart failure. James Curtis son is adamant that medications are causing his father's kidney and memory problems. I discussed that natural history of hypertension and potential for worsening kidney and heart disease in the setting of untreated or undertreated hypertension. I pointed out that ARB's like losartan have protective effects on the kidneys. The patient and his son have agreed to continue the current BP regimen but refuse any adjustments or additions today. I have encouraged James Curtis to minimize his salt intake and have provided him with information on the DASH diet. Renal artery Doppler could be considered in the future.  Chronic kidney disease stage III Creatine stable around 1.5 since hospitalization/surgery in 11/2016. I suspect this is his new baseline. James Curtis is currently undergoing urologic surveillance of renal mass. We discussed referral to nephrology, though James Curtis son declined, as he did not feel that there was utility in seeing urology and nephrology. This should be readdressed at follow-up.  Follow-up: Return to clinic in 1 month to see [name], PA.  Nelva Bush, MD 08/24/2017 11:19 AM

## 2017-08-24 NOTE — Patient Instructions (Signed)
Medication Instructions:   Stop Simvastatin  Take Amoxicillin 30-60 minutes before dental appointment. This will be 4 Amoxicillin 500mg  tablets at the same time 30-60 minutes before dental appointment.  -- If you need a refill on your cardiac medications before your next appointment, please call your pharmacy. --  Labwork: None ordered  Testing/Procedures: None ordered  Follow-Up: Your physician wants you to follow-up in: 1 month with [name] PA.   Thank you for choosing [facility]!!    Any Other Special Instructions Will Be Listed Below (If Applicable).   DASH Eating Plan DASH stands for "Dietary Approaches to Stop Hypertension." The DASH eating plan is a healthy eating plan that has been shown to reduce high blood pressure (hypertension). It may also reduce your risk for type 2 diabetes, heart disease, and stroke. The DASH eating plan may also help with weight loss. What are tips for following this plan? General guidelines  Avoid eating more than 2,300 mg (milligrams) of salt (sodium) a day. If you have hypertension, you may need to reduce your sodium intake to 1,500 mg a day.  Limit alcohol intake to no more than 1 drink a day for nonpregnant women and 2 drinks a day for men. One drink equals 12 oz of beer, 5 oz of wine, or 1 oz of hard liquor.  Work with your health care provider to maintain a healthy body weight or to lose weight. Ask what an ideal weight is for you.  Get at least 30 minutes of exercise that causes your heart to beat faster (aerobic exercise) most days of the week. Activities may include walking, swimming, or biking.  Work with your health care provider or diet and nutrition specialist (dietitian) to adjust your eating plan to your individual calorie needs. Reading food labels  Check food labels for the amount of sodium per serving. Choose foods with less than 5 percent of the Daily Value of sodium. Generally, foods with less than 300 mg of  sodium per serving fit into this eating plan.  To find whole grains, look for the word "whole" as the first word in the ingredient list. Shopping  Buy products labeled as "low-sodium" or "no salt added."  Buy fresh foods. Avoid canned foods and premade or frozen meals. Cooking  Avoid adding salt when cooking. Use salt-free seasonings or herbs instead of table salt or sea salt. Check with your health care provider or pharmacist before using salt substitutes.  Do not fry foods. Cook foods using healthy methods such as baking, boiling, grilling, and broiling instead.  Cook with heart-healthy oils, such as olive, canola, soybean, or sunflower oil. Meal planning   Eat a balanced diet that includes: ? 5 or more servings of fruits and vegetables each day. At each meal, try to fill half of your plate with fruits and vegetables. ? Up to 6-8 servings of whole grains each day. ? Less than 6 oz of lean meat, poultry, or fish each day. A 3-oz serving of meat is about the same size as a deck of cards. One egg equals 1 oz. ? 2 servings of low-fat dairy each day. ? A serving of nuts, seeds, or beans 5 times each week. ? Heart-healthy fats. Healthy fats called Omega-3 fatty acids are found in foods such as flaxseeds and coldwater fish, like sardines, salmon, and mackerel.  Limit how much you eat of the following: ? Canned or prepackaged foods. ? Food that is high in trans fat, such as fried foods. ?  Food that is high in saturated fat, such as fatty meat. ? Sweets, desserts, sugary drinks, and other foods with added sugar. ? Full-fat dairy products.  Do not salt foods before eating.  Try to eat at least 2 vegetarian meals each week.  Eat more home-cooked food and less restaurant, buffet, and fast food.  When eating at a restaurant, ask that your food be prepared with less salt or no salt, if possible. What foods are recommended? The items listed may not be a complete list. Talk with your  dietitian about what dietary choices are best for you. Grains Whole-grain or whole-wheat bread. Whole-grain or whole-wheat pasta. Brown rice. [place]row. Bulgur. Whole-grain and low-sodium cereals. Pita bread. Low-fat, low-sodium crackers. Whole-wheat flour tortillas. Vegetables Fresh or frozen vegetables (raw, steamed, roasted, or grilled). Low-sodium or reduced-sodium tomato and vegetable juice. Low-sodium or reduced-sodium tomato sauce and tomato paste. Low-sodium or reduced-sodium canned vegetables. Fruits All fresh, dried, or frozen fruit. Canned fruit in natural juice (without added sugar). Meat and other protein foods Skinless chicken or [place]. Ground chicken or [place]. Pork with fat trimmed off. Fish and seafood. Egg whites. Dried beans, peas, or lentils. Unsalted nuts, nut butters, and seeds. Unsalted canned beans. Lean cuts of beef with fat trimmed off. Low-sodium, lean deli meat. Dairy Low-fat (1%) or fat-free (skim) milk. Fat-free, low-fat, or reduced-fat cheeses. Nonfat, low-sodium ricotta or cottage cheese. Low-fat or nonfat yogurt. Low-fat, low-sodium cheese. Fats and oils Soft margarine without trans fats. Vegetable oil. Low-fat, reduced-fat, or light mayonnaise and salad dressings (reduced-sodium). Canola, safflower, olive, soybean, and sunflower oils. Avocado. Seasoning and other foods Herbs. Spices. Seasoning mixes without salt. Unsalted popcorn and pretzels. Fat-free sweets. What foods are not recommended? The items listed may not be a complete list. Talk with your dietitian about what dietary choices are best for you. Grains Baked goods made with fat, such as croissants, muffins, or some breads. Dry pasta or rice meal packs. Vegetables Creamed or fried vegetables. Vegetables in a cheese sauce. Regular canned vegetables (not low-sodium or reduced-sodium). Regular canned tomato sauce and paste (not low-sodium or reduced-sodium). Regular tomato and vegetable juice (not  low-sodium or reduced-sodium). Angie Fava. Olives. Fruits Canned fruit in a light or heavy syrup. Fried fruit. Fruit in cream or butter sauce. Meat and other protein foods Fatty cuts of meat. Ribs. Fried meat. Berniece Salines. Sausage. Bologna and other processed lunch meats. Salami. Fatback. Hotdogs. Bratwurst. Salted nuts and seeds. Canned beans with added salt. Canned or smoked fish. Whole eggs or egg yolks. Chicken or [place] with skin. Dairy Whole or 2% milk, cream, and half-and-half. Whole or full-fat cream cheese. Whole-fat or sweetened yogurt. Full-fat cheese. Nondairy creamers. Whipped toppings. Processed cheese and cheese spreads. Fats and oils Butter. Stick margarine. Lard. Shortening. Ghee. Bacon fat. Tropical oils, such as coconut, palm kernel, or palm oil. Seasoning and other foods Salted popcorn and pretzels. Onion salt, garlic salt, seasoned salt, table salt, and sea salt. Worcestershire sauce. Tartar sauce. Barbecue sauce. Teriyaki sauce. Soy sauce, including reduced-sodium. Steak sauce. Canned and packaged gravies. Fish sauce. Oyster sauce. Cocktail sauce. Horseradish that you find on the shelf. Ketchup. Mustard. Meat flavorings and tenderizers. Bouillon cubes. Hot sauce and Tabasco sauce. Premade or packaged marinades. Premade or packaged taco seasonings. Relishes. Regular salad dressings. Where to find more information:  National Heart, Lung, and [place]: https://wilson-eaton.com/  American Heart Association: www.heart.org Summary  The DASH eating plan is a healthy eating plan that has been shown to reduce high blood  pressure (hypertension). It may also reduce your risk for type 2 diabetes, heart disease, and stroke.  With the DASH eating plan, you should limit salt (sodium) intake to 2,300 mg a day. If you have hypertension, you may need to reduce your sodium intake to 1,500 mg a day.  When on the DASH eating plan, aim to eat more fresh fruits and vegetables, whole grains, lean proteins,  low-fat dairy, and heart-healthy fats.  Work with your health care provider or diet and nutrition specialist (dietitian) to adjust your eating plan to your individual calorie needs. This information is not intended to replace advice given to you by your health care provider. Make sure you discuss any questions you have with your health care provider. Document Released: 07/21/2011 Document Revised: 07/25/2016 Document Reviewed: 07/25/2016 Elsevier Interactive Patient Education  [employer].

## 2017-08-25 ENCOUNTER — Encounter: Payer: Self-pay | Admitting: Internal Medicine

## 2017-08-25 ENCOUNTER — Encounter (HOSPITAL_COMMUNITY)
Admission: RE | Admit: 2017-08-25 | Discharge: 2017-08-25 | Disposition: A | Discharge status: Home / Self Care | Payer: Self-pay | Source: Ambulatory Visit | Attending: Internal Medicine | Admitting: Internal Medicine

## 2017-08-25 DIAGNOSIS — I38 Endocarditis, valve unspecified: Secondary | ICD-10-CM | POA: Insufficient documentation

## 2017-08-28 ENCOUNTER — Other Ambulatory Visit: Payer: Self-pay | Admitting: Adult Health

## 2017-08-29 DIAGNOSIS — D3000 Benign neoplasm of unspecified kidney: Secondary | ICD-10-CM | POA: Diagnosis not present

## 2017-08-30 ENCOUNTER — Encounter (HOSPITAL_COMMUNITY)
Admission: RE | Admit: 2017-08-30 | Discharge: 2017-08-30 | Disposition: A | Discharge status: Home / Self Care | Payer: Self-pay | Source: Ambulatory Visit | Attending: Internal Medicine | Admitting: Internal Medicine

## 2017-09-04 ENCOUNTER — Telehealth: Payer: Self-pay | Admitting: Neurology

## 2017-09-04 ENCOUNTER — Ambulatory Visit (INDEPENDENT_AMBULATORY_CARE_PROVIDER_SITE_OTHER): Payer: Medicare Other | Admitting: Neurology

## 2017-09-04 ENCOUNTER — Encounter (HOSPITAL_COMMUNITY)
Admission: RE | Admit: 2017-09-04 | Discharge: 2017-09-04 | Disposition: A | Discharge status: Home / Self Care | Payer: Self-pay | Source: Ambulatory Visit | Attending: Internal Medicine | Admitting: Internal Medicine

## 2017-09-04 ENCOUNTER — Encounter: Payer: Self-pay | Admitting: Neurology

## 2017-09-04 VITALS — BP 135/69 | HR 78 | Ht 66.5 in | Wt 214.4 lb

## 2017-09-04 DIAGNOSIS — I2510 Atherosclerotic heart disease of native coronary artery without angina pectoris: Secondary | ICD-10-CM | POA: Diagnosis not present

## 2017-09-04 DIAGNOSIS — I058 Other rheumatic mitral valve diseases: Secondary | ICD-10-CM

## 2017-09-04 DIAGNOSIS — E785 Hyperlipidemia, unspecified: Secondary | ICD-10-CM | POA: Diagnosis not present

## 2017-09-04 DIAGNOSIS — I059 Rheumatic mitral valve disease, unspecified: Secondary | ICD-10-CM

## 2017-09-04 DIAGNOSIS — G451 Carotid artery syndrome (hemispheric): Secondary | ICD-10-CM

## 2017-09-04 DIAGNOSIS — Z87891 Personal history of nicotine dependence: Secondary | ICD-10-CM

## 2017-09-04 DIAGNOSIS — Z952 Presence of prosthetic heart valve: Secondary | ICD-10-CM | POA: Diagnosis not present

## 2017-09-04 DIAGNOSIS — I10 Essential (primary) hypertension: Secondary | ICD-10-CM

## 2017-09-04 NOTE — Telephone Encounter (Signed)
Patient refused to see NP Hassell Done, was very persistent that he saw Dr. Erlinda Hong before he leaves [facility].

## 2017-09-04 NOTE — Progress Notes (Signed)
STROKE NEUROLOGY FOLLOW UP NOTE  NAME: James Curtis DOB: 02/02/1941  REASON FOR VISIT: stroke follow up HISTORY FROM: pt and son and chart  Today we had the pleasure of seeing James Curtis in follow-up at our Neurology Clinic. Pt was accompanied by son.   History Summary James Curtis is a 77 y.o. male with history of HTN, HLD, tobacco smoker and diet-controlled DB admitted on 11/18/16 for slurred speech, confusion and R facial droop. Symptoms resolved quickly. MRI no acute stroke. Stroke workup including MRA, carotid Doppler unremarkable. TTE showed EF 65-70% with mobile echodensity posterior mitral valve. TEE done again showed mitral wall mobile calcified structure with unclear etiology. LDL 60 and A1c 6.2. CVTS consulted and plan for resection. He was also found to have complete heart block confirmed on EKG, cardiology consulted and had a pacemaker placed. He was discharged with aspirin and simvastatin.   Readmitted on 12/02/16, had resection of mitral wall mass, pathology showed calcified febrile take nodule and mitral valve leaflets with extensive calcification. His admission was complicated by hematuria, urinary retention, urethral trauma due to Foley catheter. Eventually he was discharged to SNF.   03/07/17 follow-up - the patient has been doing well. He is at home now. Followed with cardiology and Dr. Roxy Manns, doing well with cardiac function and pacemaker. Has quit smoking. Patient complains of dark stool but he is on iron pills. Family complains of patient lack of activity and social withdrawal. Patient refuses any medication for depression. BP today 126/69   Interval History During the interval time, patient has been doing well.  No recurrent strokelike symptoms.  Family complains of memory loss, easy forget, need time to remember things.  Follow with cardiology and Zocor stopped due to concerns of cognitive impairment, liver or kidney injury.  Patient still has extra PT/OT 3 times  per week.  Had both eye cataract surgery in 07/04/17 and 08/03/17.  BP today in clinic 135/69.  Still on aspirin 81.  REVIEW OF SYSTEMS: Full 14 system review of systems performed and notable only for those listed below and in HPI above, all others are negative:  Constitutional:   Cardiovascular:  Ear/Nose/Throat:   Skin:  Eyes:    Respiratory:     Gastroitestinal:   Genitourinary:  Hematology/Lymphatic:   Endocrine:  Musculoskeletal:   Allergy/Immunology:   Neurological:   memory loss  Psychiatric:    Sleep:   The following represents the patient's updated allergies and side effects list: No Known Allergies  The neurologically relevant items on the patient's problem list were reviewed on today's visit.  Neurologic Examination  A problem focused neurological exam (12 or more points of the single system neurologic examination, vital signs counts as 1 point, cranial nerves count for 8 points) was performed.  Blood pressure 135/69, pulse 78, height 5' 6.5" (1.689 m), weight 214 lb 6.4 oz (97.3 kg).  General - Well nourished, well developed, in no apparent distress.  Ophthalmologic - Fundi not visualized due to eye movement.  Cardiovascular - Regular rate and rhythm with no murmur.  Mental Status -  Level of arousal and orientation to time, place, and person were intact. Language including expression, naming, repetition, comprehension was assessed and found intact. Fund of Knowledge was assessed and was impaired.  Cranial Nerves II - XII - II - Visual field intact OU. III, IV, VI - Extraocular movements intact. V - Facial sensation intact bilaterally. VII - Facial movement intact bilaterally. VIII - Hearing & vestibular intact bilaterally.  X - Palate elevates symmetrically. XI - Chin turning & shoulder shrug intact bilaterally. XII - Tongue protrusion intact.  Motor Strength - The patient's strength was normal in all extremities and pronator drift was absent.  Bulk was  normal and fasciculations were absent.   Motor Tone - Muscle tone was assessed at the neck and appendages and was normal.  Reflexes - The patient's reflexes were 1+ in all extremities and he had no pathological reflexes.  Sensory - Light touch, temperature/pinprick, vibration and proprioception, and Romberg testing were assessed and were normal.    Coordination - The patient had normal movements in the hands and feet with no ataxia or dysmetria.  Tremor was absent.  Gait and Station - walk with cane, small stride and cautious gait   Data reviewed: I personally reviewed the images and agree with the radiology interpretations.  Mr Brain 107 Contrast [name] Head Wo Contrast 11/19/2016 1. No acute intracranial abnormality identified. 2. Stable mild chronic microvascular ischemic changes and mild parenchymal volume loss of the brain. Stable small chronic lacunar infarct in left hemi pons. 3. Patent circle of Willis. No high-grade stenosis, large vessel occlusion, or aneurysm identified.   2D Echocardiogram  Impressions:   Hyperdynamic LVEF. Moderate aortic stenosis. Moderate mitral stenosis. The mitral valve is severely thickenedand calcified with restricted leaflet opening. There are severemitral annular calcifications predominantly posterior.There is a mobile echodensity attached to the posterior mitralvalve leaflet/annulus from the left atrial side measuring 12 x 66mm. This might represent a vegetation vs detached calcifications.A TEE is recommended for further evaluation.  Carotid Doppler   There is 1-39% bilateral ICA stenosis. Vertebral artery flow is antegrade.    TEE Left ventricle: The cavity size was normal. There was severe concentric hypertrophy. Systolic function was normal. The estimated ejection fraction was in the range of 60% to 65%. - Aortic valve: Cusp separation was reduced. Noncoronary cusp mobility was restricted. - Mitral valve: Mildly calcified  annulus. Mildly thickened leaflets . There was a 16 x 4 mm mobile calcified echodensity on the atrial basal surface of the posterior mitral valve. Differential includes fibroelastoma or vegetation (checking blood cultures). Chronic vegetations are usually calcified as this mass currently demonstrates, however they are not as mobile as we are seeing. There was mild regurgitation. - Left atrium: No evidence of thrombus in the atrial cavity or appendage. No evidence of thrombus in the appendage. - Right atrium: Pacer wire or catheter noted in right atrium. No evidence of thrombus in the atrial cavity or appendage. - Tricuspid valve: No evidence of vegetation. - Pulmonic valve: No evidence of vegetation.  Component     Latest Ref Rng & Units 11/19/2016  Cholesterol     0 - 200 mg/dL 106  Triglycerides     <150 mg/dL 97  HDL Cholesterol     >40 mg/dL 27 (L)  Total CHOL/HDL Ratio     RATIO 3.9  VLDL     0 - 40 mg/dL 19  LDL (calc)     0 - 99 mg/dL 60  Hemoglobin A1C     4.8 - 5.6 % 6.2 (H)  Mean Plasma Glucose     mg/dL 131    Assessment: As you may recall, he is a 77 y.o. African American male with PMH of HTN, HLD, tobacco smoker and diet-controlled DB admitted on 11/18/16 for slurred speech, confusion and R facial droop. Symptoms resolved quickly. MRI no acute stroke. Stroke workup including MRA, carotid Doppler unremarkable. TTE showed  EF 65-70% with mobile echodensity posterior mitral valve. TEE done again showed mitral wall mobile calcified structure with unclear etiology. LDL 60 and A1c 6.2. CVTS consulted and plan for resection. He was also found to have complete heart block confirmed on EKG, cardiology consulted and had a pacemaker placed. He was discharged with aspirin and simvastatin. Readmitted on 12/02/16 for resection of mitral wall mass and aortic valve replacement, pathology showed calcified febrile take nodule and mitral valve leaflets with extensive  calcification. Followed with cardiology and Dr. Roxy Manns, doing well with cardiac function and pacemaker. Has quit smoking. Patient refuses any medication for depression. Continued on ASA. Zocor d/c'ed by cardiology for concerning of side effects of memory loss, liver and kidney injury.    Plan:  - continue ASA for stroke prevention - follow up with cardiology and follow up with pacemaker. If afib found, will need stronger blood thinners - Follow up with your primary care physician for stroke risk factor modification. Recommend maintain blood pressure goal <130/80, diabetes with hemoglobin A1c goal below 7.0% and lipids with LDL cholesterol goal below 70 mg/dL.  - check BP at home and record - continue abstain from smoking - healthy diet, relaxation, de-stress yourself and regular exercise - No restriction for driving, but recommend to drive with family members on board for 2-3 times initially. If both parties feel comfortable of your driving, you can drive alone after. However, you are recommended to drive during the day not at night, no long distance and drive in familiar roads. - follow up in 6 months.   I spent more than 25 minutes of face to face time with the patient. Greater than 50% of time was spent in counseling and coordination of care. We discussed medication management, memory difficulty, regular exercises and relaxation skills.   No orders of the defined types were placed in this encounter.   No orders of the defined types were placed in this encounter.   Patient Instructions  - continue ASA for stroke prevention - follow up with cardiology and follow up with pacemaker. If afib found, will need stronger blood thinners - Follow up with your primary care physician for stroke risk factor modification. Recommend maintain blood pressure goal <130/80, diabetes with hemoglobin A1c goal below 7.0% and lipids with LDL cholesterol goal below 70 mg/dL.  - check BP at home and record -  continue abstain from smoking - healthy diet, relaxation, de-stress yourself and regular exercise - No restriction for driving, but recommend to drive with family members on board for 2-3 times initially. If both parties feel comfortable of your driving, you can drive alone after. However, you are recommended to drive during the day not at night, no long distance and drive in familiar roads. - follow up in 6 months.    Rosalin Hawking, MD PhD [facility] [address] [place], [place] [address] [phone]

## 2017-09-04 NOTE — Patient Instructions (Addendum)
-   continue ASA for stroke prevention - follow up with cardiology and follow up with pacemaker. If afib found, will need stronger blood thinners - Follow up with your primary care physician for stroke risk factor modification. Recommend maintain blood pressure goal <130/80, diabetes with hemoglobin A1c goal below 7.0% and lipids with LDL cholesterol goal below 70 mg/dL.  - check BP at home and record - continue abstain from smoking - healthy diet, relaxation, de-stress yourself and regular exercise - No restriction for driving, but recommend to drive with family members on board for 2-3 times initially. If both parties feel comfortable of your driving, you can drive alone after. However, you are recommended to drive during the day not at night, no long distance and drive in familiar roads. - follow up in 6 months.

## 2017-09-04 NOTE — Telephone Encounter (Signed)
That is ok. I will see him on 01/10/18. Thanks.   Rosalin Hawking, MD PhD Stroke Neurology 09/04/2017 5:37 PM

## 2017-09-06 ENCOUNTER — Encounter (HOSPITAL_COMMUNITY)
Admission: RE | Admit: 2017-09-06 | Discharge: 2017-09-06 | Disposition: A | Discharge status: Home / Self Care | Payer: Self-pay | Source: Ambulatory Visit | Attending: Internal Medicine | Admitting: Internal Medicine

## 2017-09-08 ENCOUNTER — Encounter (HOSPITAL_COMMUNITY)
Admission: RE | Admit: 2017-09-08 | Discharge: 2017-09-08 | Disposition: A | Discharge status: Home / Self Care | Payer: Self-pay | Source: Ambulatory Visit | Attending: Internal Medicine | Admitting: Internal Medicine

## 2017-09-11 ENCOUNTER — Encounter (HOSPITAL_COMMUNITY)
Admission: RE | Admit: 2017-09-11 | Discharge: 2017-09-11 | Disposition: A | Discharge status: Home / Self Care | Payer: Self-pay | Source: Ambulatory Visit | Attending: Internal Medicine | Admitting: Internal Medicine

## 2017-09-13 ENCOUNTER — Encounter (HOSPITAL_COMMUNITY)
Admission: RE | Admit: 2017-09-13 | Discharge: 2017-09-13 | Disposition: A | Discharge status: Home / Self Care | Payer: Self-pay | Source: Ambulatory Visit | Attending: Internal Medicine | Admitting: Internal Medicine

## 2017-09-15 ENCOUNTER — Encounter (HOSPITAL_COMMUNITY)
Admission: RE | Admit: 2017-09-15 | Discharge: 2017-09-15 | Disposition: A | Discharge status: Home / Self Care | Payer: Self-pay | Source: Ambulatory Visit | Attending: Internal Medicine | Admitting: Internal Medicine

## 2017-09-15 DIAGNOSIS — Z953 Presence of xenogenic heart valve: Secondary | ICD-10-CM | POA: Insufficient documentation

## 2017-09-18 ENCOUNTER — Encounter (HOSPITAL_COMMUNITY)
Admission: RE | Admit: 2017-09-18 | Discharge: 2017-09-18 | Disposition: A | Discharge status: Home / Self Care | Payer: Self-pay | Source: Ambulatory Visit | Attending: Internal Medicine | Admitting: Internal Medicine

## 2017-09-20 ENCOUNTER — Encounter (HOSPITAL_COMMUNITY)
Admission: RE | Admit: 2017-09-20 | Discharge: 2017-09-20 | Disposition: A | Discharge status: Home / Self Care | Payer: Self-pay | Source: Ambulatory Visit | Attending: Internal Medicine | Admitting: Internal Medicine

## 2017-09-20 NOTE — Progress Notes (Signed)
OUTPATIENT CARDIAC REHAB  PMH:  Primary Cardiologist:  Dr.  Saunders Revel   Pt arrived at cardiac rehab maintenance with BP:  160/100.  Pt denies missed medication doses. Recheck BP:  160/84.  Pt participated in light exercise with BP:  180/.  Exercise stopped. Pt son notified.  Pt son states pt has DC amlodipine.  Pt son states per his online research he believes normal blood pressure to be higher and does not want his father taking more medication than necessary due to fear of side effects.   Pt son states during recent neurology f/u appt Dr. Erlinda Hong stated it was ok to discontinue amlodipine if pt BP normal without it.  Last office note reviewed with no order to discontinue amlodipine and recommendation to maintain resting BP <130/80.  This Information and current guidelines with hypertension treatment recommendations reviewed with pt son.  Pt son would like to review his own  research before making decision to resume amlodipine.  Concern for pt untoward effects from hypertension expressed to son.  Rehab reports faxed for Dr. Saunders Revel to review.   Copy of rehab reports given to pt son after obtaining signed release of medical records.   Andi Hence, RN, BSN, Cardiac Pulmonary Rehab

## 2017-09-22 ENCOUNTER — Encounter (HOSPITAL_COMMUNITY)
Admission: RE | Admit: 2017-09-22 | Discharge: 2017-09-22 | Disposition: A | Discharge status: Home / Self Care | Payer: Self-pay | Source: Ambulatory Visit | Attending: Internal Medicine | Admitting: Internal Medicine

## 2017-09-25 ENCOUNTER — Encounter (HOSPITAL_COMMUNITY): Payer: Self-pay

## 2017-09-27 ENCOUNTER — Telehealth: Payer: Self-pay

## 2017-09-27 ENCOUNTER — Encounter (HOSPITAL_COMMUNITY)
Admission: RE | Admit: 2017-09-27 | Discharge: 2017-09-27 | Disposition: A | Discharge status: Home / Self Care | Payer: Self-pay | Source: Ambulatory Visit | Attending: Internal Medicine | Admitting: Internal Medicine

## 2017-09-27 NOTE — Telephone Encounter (Signed)
Spoke with patient's son [name]) in regards to a fax sent over from Cardiac Rehab displaying his father's BP.Marland Kitchen The son indicated that the patient has resumed his Amlodipine medication.

## 2017-09-29 ENCOUNTER — Encounter (HOSPITAL_COMMUNITY)
Admission: RE | Admit: 2017-09-29 | Discharge: 2017-09-29 | Disposition: A | Discharge status: Home / Self Care | Payer: Self-pay | Source: Ambulatory Visit | Attending: Internal Medicine | Admitting: Internal Medicine

## 2017-10-02 ENCOUNTER — Encounter (HOSPITAL_COMMUNITY): Payer: Self-pay

## 2017-10-04 ENCOUNTER — Encounter (HOSPITAL_COMMUNITY)
Admission: RE | Admit: 2017-10-04 | Discharge: 2017-10-04 | Disposition: A | Discharge status: Home / Self Care | Payer: Self-pay | Source: Ambulatory Visit | Attending: Internal Medicine | Admitting: Internal Medicine

## 2017-10-06 ENCOUNTER — Encounter (HOSPITAL_COMMUNITY)
Admission: RE | Admit: 2017-10-06 | Discharge: 2017-10-06 | Disposition: A | Discharge status: Home / Self Care | Payer: Medicare Other | Source: Ambulatory Visit | Attending: Internal Medicine | Admitting: Internal Medicine

## 2017-10-09 ENCOUNTER — Encounter (HOSPITAL_COMMUNITY)
Admission: RE | Admit: 2017-10-09 | Discharge: 2017-10-09 | Disposition: A | Discharge status: Home / Self Care | Payer: Self-pay | Source: Ambulatory Visit | Attending: Internal Medicine | Admitting: Internal Medicine

## 2017-10-09 NOTE — Progress Notes (Signed)
Cardiology Office Note:    Date:  10/10/2017   ID:  James Curtis, DOB 1941/08/05, MRN [id number]  PCP:  James Carol, MD  Cardiologist:  James Bush, MD  Electrophysiologist: Dr. Allegra Curtis    Referring MD: James Carol, MD   Chief Complaint  Patient presents with  . Follow-up    Hypertension, hyperlipidemia, heart failure    History of Present Illness:    James Curtis is a 77 y.o. 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  He was last seen by Dr. [name] Curtis in 1/19.    James Curtis returns for follow up on hypertension and hyperlipidemia.  He is here with his James.  He continues to attend cardiac rehabilitation 3 times a week.  He had stopped his amlodipine for some time.  His blood pressure was significantly elevated.  He is now back on amlodipine in addition to carvedilol and losartan.  He notes that his blood pressures at home typically range 409 systolic.  He has not noticed much difference since stopping his simvastatin.  He denies any chest discomfort, significant shortness of breath, syncope, PND or edema.  He denies any bleeding issues.  Prior CV studies:   The following studies were reviewed today:  Echo 01/26/17 EF 40-45, inferior and inferoseptal hypokinesis/akinesis, severe LVH, grade 1 diastolic dysfunction, s/p AVR with mean 16, MAC, severe LAE, mildly reduced RVSF  Intraoperative TEE 12/02/16 Severe concentric LVH, EF 60-65, mild to moderate aortic stenosis, mild MR  R/L heart cath 11/25/16 LAD irregularities LCx mild disease; OM3 40 RCA proximal 50 RA 4, mean PA 19, PCWP mean 9, PVR 1.3 WU Mild to mod AS - Mean AV gradient 22, AVA 2 cm; mean gradient on pullback 16, AVA 2.3 cm  ABIs 11/25/16 ABI&'s of the  right lower extremity indicate mild/boarderline moderate peripheral arterial disease with monophasic flow. ABI&'s of the left lower extremity are within normal limits however the dorsal pedis demonstrates dampened monophasic flow.  R 0.8' L 1.06  TEE 11/23/16 Severe concentric LVH, EF 60-65, MAC, 16 x 4 mm mobile echodensity on atrial basal surface of posterior mitral valve, mild MR  Carotid US 11/20/16 Bilateral ICA 1-39  Echo 11/20/16 Mild concentric LVH, vigorous LVF, EF 65-70, normal wall motion, grade 1 diastolic dysfunction, moderate aortic stenosis (mean 26, peak 62), severe MAC, moderate mitral stenosis (mean 5), mild MR, 12 x 7 mm mobile echodensity attached to the posterior mitral valve leaflet,mild LAE, mild TR  Past Medical History:  Diagnosis Date  . Aortic stenosis    a. 11/20/2016 Echo: mod AS;  b. 11/23/2016 TEE restricted mobility of noncoronary cusp;  c. 11/2016 Cath: mean grad 4mHg w/ dual lumen catheter, AVA 2.0cm^2; mean gradient by pullback = 149mg, AVA 2.3cm^2.  . Cardiomyopathy ([facility]   Echo 6/18: EF 40-45, inf/inf-septal HK-AK, Gr 1 DD, severe LVH, MAC, severe LAE, mildly reduced RVSF  . Carotid arterial disease ([facility]   a. 11/2016 Carotid U/S: bilat 1-39% ICA stenosis.  . Chronic kidney disease   . Complete heart block ([facility]   a. 11/21/2016 s/p MDT AzSantina EvansR MRI SureScan (ser # [id number]).  . Dementia    mild  . Family history of adverse reaction to anesthesia    James Curtis a difficult time waking up  . Heart murmur   .  Hypercholesteremia   . Hypertension    benign  . Incidental pulmonary nodule, greater than or equal to 44m - needs f/u scan 3 months 11/28/2016   14 x 8 mm (mean diameter of 11 mm) left lower lobe pulmonary nodule with some surrounding ground-glass attenuation (axial image 42 of series 6). 9 x 5 mm (mean diameter of 7 mm) subpleural nodule in the periphery of the right upper lobe (axial image 20 of series 6). A few patchy areas of  peripheral predominant ground-glass attenuation and subpleural reticulation are noted, scattered randomly throu  . Left atrial mass   . 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 11/2009 TEE: EF 60-65%, restricted mobility of noncoronary AoV cusp, 16x4 mm mobile Ca2+ echodensity on the atrial basal surface of the posterior MV leaflet, mild MR, no LA/LAA thrombus.  . Non-obstructive CAD (coronary artery disease)    a. 11/2016 Cath: LM large, nl, LAD min irregs, LCX large, min irregs, OM3 40, RCA large, 548m  . Pre-diabetes   . [facility]   s/p radiation  . Renal mass of unknown nature - needs f/u MRI 11/28/2016   Multiple renal lesions are noted in the kidneys bilaterally. Although several of these lesions are low-attenuation, compatible with cysts, other lesions are intermediate to high attenuation, potentially enhancing  . S/P aortic valve replacement with bioprosthetic valve 12/02/2016   23 mm Edwards Intuity rapid deployment bovine pericardial tissue valve  . S/P resection of left atrial mass 12/02/2016   Benign dystrophic fibrotic tissue with calcification attached to posterior mitral annulus by thin fibrotic stalk  . TIA (transient ischemic attack)    a. 11/2016 in setting of complete heart block.    Past Surgical History:  Procedure Laterality Date  . AORTIC VALVE REPLACEMENT N/A 12/02/2016   Procedure: AORTIC VALVE REPLACEMENT (AVR) using a 23 Edwards Intuity Elite Aortic Valve;  Surgeon: ClRexene AlbertsMD;  [place] [facility]: [facility];  Laterality: N/A;  . EXCISION OF ATRIAL MYXOMA N/A 12/02/2016   Procedure: Resection of left atrial mass ;  Surgeon: ClRexene AlbertsMD;  [facility]: [facility];  Laterality: N/A;  . PACEMAKER IMPLANT N/A 11/21/2016   Procedure: Pacemaker Implant;  Surgeon: Will MaMeredith LeedsMD;  [facility];  Service: Cardiovascular;  Laterality: N/A;  . RIGHT/LEFT HEART CATH AND CORONARY  ANGIOGRAPHY N/A 11/25/2016   Procedure: Right/Left Heart Cath and Coronary Angiography;  Surgeon: ChNelva BushMD;  [facility];  Service: Cardiovascular;  Laterality: N/A;  . TEE WITHOUT CARDIOVERSION N/A 11/23/2016   Procedure: TRANSESOPHAGEAL ECHOCARDIOGRAM (TEE);  Surgeon: MaJerline PainMD;  [facility] Service: Cardiovascular;  Laterality: N/A;  . TEE WITHOUT CARDIOVERSION N/A 12/02/2016   Procedure: TRANSESOPHAGEAL ECHOCARDIOGRAM (TEE);  Surgeon: ClRexene AlbertsMD;  [facility]: Open Heart Surgery;  Laterality: N/A;    Current Medications: Current Meds  Medication Sig  . amLODipine (NORVASC) 10 MG tablet Take 10 mg by mouth daily.  . Marland Kitchenmoxicillin (AMOXIL) 500 MG tablet Take 4 tablets (2 grams) by mouth 30-60 minutes prior to dental visit.  . Marland Kitchenspirin EC 81 MG tablet Take 81 mg by mouth daily.  . carvedilol (COREG) 6.25 MG tablet TAKE 1 TABLET BY MOUTH TWICE A DAY  . cholecalciferol (VITAMIN D) 1000 units tablet Take 1,000 Units by mouth daily.  . iron polysaccharides (NIFEREX) 150 MG capsule Take  1 capsule (150 mg total) by mouth daily.  . Tetrahydrozoline HCl (VISINE OP) Apply to eye as directed.     Allergies:   Patient has no known allergies.   Social History   Tobacco Use  . Smoking status: Former Smoker    Packs/day: 0.25    Types: Cigarettes    Last attempt to quit: 11/16/2016    Years since quitting: 0.8  . Smokeless tobacco: Never Used  Substance Use Topics  . Alcohol use: No  . Drug use: No     Family Hx: The patient's family history includes Cancer in his father.  ROS:   Please see the history of present illness.    ROS All other systems reviewed and are negative.   EKGs/Labs/Other Test Reviewed:    EKG:  EKG is  ordered today.  The ekg ordered today demonstrates AV paced, HR 60  Recent Labs: 12/03/2016: Magnesium 2.5 12/05/2016: ALT 18 12/14/2016: Hemoglobin 8.8; Platelets 244 05/16/2017: BUN 21; Potassium  4.4; Sodium 143 08/09/2017: Creatinine, Ser 1.64   Recent Lipid Panel Lab Results  Component Value Date/Time   CHOL 106 11/19/2016 03:23 AM   TRIG 97 11/19/2016 03:23 AM   HDL 27 (L) 11/19/2016 03:23 AM   CHOLHDL 3.9 11/19/2016 03:23 AM   LDLCALC 60 11/19/2016 03:23 AM    Physical Exam:    VS:  BP (!) 142/70   Pulse 60   Ht 5' 7.5" (1.715 m)   Wt 202 lb 1.9 oz (91.7 kg)   SpO2 94%   BMI 31.19 kg/m     Wt Readings from Last 3 Encounters:  10/10/17 202 lb 1.9 oz (91.7 kg)  09/04/17 214 lb 6.4 oz (97.3 kg)  08/24/17 212 lb (96.2 kg)     Physical Exam  Constitutional: He is oriented to person, place, and time. He appears well-developed and well-nourished. No distress.  HENT:  Head: Normocephalic and atraumatic.  Neck: No JVD present.  Cardiovascular: Normal rate and regular rhythm.  Murmur heard.  Harsh systolic murmur is present with a grade of 3/6 at the upper right sternal border. Pulmonary/Chest: Effort normal. He has no rales.  Abdominal: Soft.  Musculoskeletal: He exhibits no edema.  Neurological: He is alert and oriented to person, place, and time.  Skin: Skin is warm and dry.    ASSESSMENT & PLAN:    1.  Essential hypertension Blood pressure remains above target.  I had a long discussion with the patient and his James today regarding the rationale for strict blood pressure control.  We discussed that his current target is <130/80.  He prefers to not add any medications at this time.  His James notes that he is trying to get him off of all medications.  I have suggested that he try to further limit his salt intake.  He does note eating frozen dinners as well as canned soup.  I have asked him to continue to monitor his blood pressure at home and to bring in readings with him to his next visit.  I have suggested that if his blood pressure does not reach goal by continuing to limit his salt, we should consider adding an additional agent to get a target.  2.  Chronic  combined systolic and diastolic CHF (congestive heart failure) (HCC) EF 40-45 by echocardiogram June 2018.  NYHA 2.  Volume appears stable.  Continue beta-blocker, ARB.  3.  Coronary artery disease Mild to moderate nonobstructive CAD by cardiac catheterization April 2018.  He denies  symptoms of angina.  Continue aspirin.  I have recommended resuming statin therapy for risk factor modification.  He is currently not interested in resuming statin therapy.  4.  S/P AVR (aortic valve replacement) Status post left atrial mass resection and bioprosthetic AVR in April 2018.  Gradients were stable by echocardiogram June 2018.  Murmur is noticeable on exam.  He does not have symptoms to suggest worsening stenosis.  Consider arranging follow-up echo at next office visit.  5.  Chronic kidney disease, stage III (moderate) (HCC) Renal function has remained fairly stable.  Continue ARB.  We did discuss the importance of good blood pressure control as it relates to preventing advanced kidney disease and, ultimately, the need for dialysis.  6.  Cardiac pacemaker in situ Follow-up with EP as planned.   Dispo:  Return in about 3 months (around 01/07/2018) for Routine Follow Up, w/ [name], PA-C.   Medication Adjustments/Labs and Tests Ordered: Current medicines are reviewed at length with the patient today.  Concerns regarding medicines are outlined above.  Tests Ordered: Orders Placed This Encounter  Procedures  . EKG 12-Lead   Medication Changes: No orders of the defined types were placed in this encounter.   Signed, [name], PA-C  10/10/2017 12:19 PM    [facility] [address], [place], [place]  [id number] Phone: [phone]; Fax: [phone]

## 2017-10-10 ENCOUNTER — Ambulatory Visit (INDEPENDENT_AMBULATORY_CARE_PROVIDER_SITE_OTHER): Payer: Medicare Other | Admitting: Physician Assistant

## 2017-10-10 ENCOUNTER — Encounter: Payer: Self-pay | Admitting: Physician Assistant

## 2017-10-10 VITALS — BP 142/70 | HR 60 | Ht 67.5 in | Wt 202.1 lb

## 2017-10-10 DIAGNOSIS — N183 Chronic kidney disease, stage 3 unspecified: Secondary | ICD-10-CM

## 2017-10-10 DIAGNOSIS — I5042 Chronic combined systolic (congestive) and diastolic (congestive) heart failure: Secondary | ICD-10-CM | POA: Diagnosis not present

## 2017-10-10 DIAGNOSIS — I10 Essential (primary) hypertension: Secondary | ICD-10-CM | POA: Diagnosis not present

## 2017-10-10 DIAGNOSIS — Z950 Presence of cardiac pacemaker: Secondary | ICD-10-CM | POA: Diagnosis not present

## 2017-10-10 DIAGNOSIS — Z952 Presence of prosthetic heart valve: Secondary | ICD-10-CM

## 2017-10-10 DIAGNOSIS — I2510 Atherosclerotic heart disease of native coronary artery without angina pectoris: Secondary | ICD-10-CM

## 2017-10-10 NOTE — Patient Instructions (Addendum)
Medication Instructions:  1. Your physician recommends that you continue on your current medications as directed. Please refer to the Current Medication list given to you today.   Labwork: NONE ORDERED TODAY  Testing/Procedures: NONE ORDERED TODAY  Follow-Up: Jan 09, 2018 @ 9:15 WITH SCOTT WEAVER, PAC   Any Other Special Instructions Will Be Listed Below (If Applicable).  BRING BLOOD PRESSURE READINGS TO YOUR NEXT APPT    If you need a refill on your cardiac medications before your next appointment, please call your pharmacy.   Low-Sodium Eating Plan Sodium, which is an element that makes up salt, helps you maintain a healthy balance of fluids in your body. Too much sodium can increase your blood pressure and cause fluid and waste to be held in your body. Your health care provider or dietitian may recommend following this plan if you have high blood pressure (hypertension), kidney disease, liver disease, or heart failure. Eating less sodium can help lower your blood pressure, reduce swelling, and protect your heart, liver, and kidneys. What are tips for following this plan? General guidelines  Most people on this plan should limit their sodium intake to 1,500-2,000 mg (milligrams) of sodium each day. Reading food labels  The Nutrition Facts label lists the amount of sodium in one serving of the food. If you eat more than one serving, you must multiply the listed amount of sodium by the number of servings.  Choose foods with less than 140 mg of sodium per serving.  Avoid foods with 300 mg of sodium or more per serving. Shopping  Look for lower-sodium products, often labeled as "low-sodium" or "no salt added."  Always check the sodium content even if foods are labeled as "unsalted" or "no salt added".  Buy fresh foods. ? Avoid canned foods and premade or frozen meals. ? Avoid canned, cured, or processed meats  Buy breads that have less than 80 mg of sodium per  slice. Cooking  Eat more home-cooked food and less restaurant, buffet, and fast food.  Avoid adding salt when cooking. Use salt-free seasonings or herbs instead of table salt or sea salt. Check with your health care provider or pharmacist before using salt substitutes.  Cook with plant-based oils, such as canola, sunflower, or olive oil. Meal planning  When eating at a restaurant, ask that your food be prepared with less salt or no salt, if possible.  Avoid foods that contain MSG (monosodium glutamate). MSG is sometimes added to [place] food, bouillon, and some canned foods. What foods are recommended? The items listed may not be a complete list. Talk with your dietitian about what dietary choices are best for you. Grains Low-sodium cereals, including oats, puffed wheat and rice, and shredded wheat. Low-sodium crackers. Unsalted rice. Unsalted pasta. Low-sodium bread. Whole-grain breads and whole-grain pasta. Vegetables Fresh or frozen vegetables. "No salt added" canned vegetables. "No salt added" tomato sauce and paste. Low-sodium or reduced-sodium tomato and vegetable juice. Fruits Fresh, frozen, or canned fruit. Fruit juice. Meats and other protein foods Fresh or frozen (no salt added) meat, poultry, seafood, and fish. Low-sodium canned tuna and salmon. Unsalted nuts. Dried peas, beans, and lentils without added salt. Unsalted canned beans. Eggs. Unsalted nut butters. Dairy Milk. Soy milk. Cheese that is naturally low in sodium, such as ricotta cheese, fresh mozzarella, or Swiss cheese Low-sodium or reduced-sodium cheese. Cream cheese. Yogurt. Fats and oils Unsalted butter. Unsalted margarine with no trans fat. Vegetable oils such as canola or olive oils. Seasonings and other foods  Fresh and dried herbs and spices. Salt-free seasonings. Low-sodium mustard and ketchup. Sodium-free salad dressing. Sodium-free light mayonnaise. Fresh or refrigerated horseradish. Lemon juice. Vinegar.  Homemade, reduced-sodium, or low-sodium soups. Unsalted popcorn and pretzels. Low-salt or salt-free chips. What foods are not recommended? The items listed may not be a complete list. Talk with your dietitian about what dietary choices are best for you. Grains Instant hot cereals. Bread stuffing, pancake, and biscuit mixes. Croutons. Seasoned rice or pasta mixes. Noodle soup cups. Boxed or frozen macaroni and cheese. Regular salted crackers. Self-rising flour. Vegetables Sauerkraut, pickled vegetables, and relishes. Olives. [place] fries. Onion rings. Regular canned vegetables (not low-sodium or reduced-sodium). Regular canned tomato sauce and paste (not low-sodium or reduced-sodium). Regular tomato and vegetable juice (not low-sodium or reduced-sodium). Frozen vegetables in sauces. Meats and other protein foods Meat or fish that is salted, canned, smoked, spiced, or pickled. Bacon, ham, sausage, hotdogs, corned beef, chipped beef, packaged lunch meats, salt pork, jerky, pickled herring, anchovies, regular canned tuna, sardines, salted nuts. Dairy Processed cheese and cheese spreads. Cheese curds. Blue cheese. Feta cheese. String cheese. Regular cottage cheese. Buttermilk. Canned milk. Fats and oils Salted butter. Regular margarine. Ghee. Bacon fat. Seasonings and other foods Onion salt, garlic salt, seasoned salt, table salt, and sea salt. Canned and packaged gravies. Worcestershire sauce. Tartar sauce. Barbecue sauce. Teriyaki sauce. Soy sauce, including reduced-sodium. Steak sauce. Fish sauce. Oyster sauce. Cocktail sauce. Horseradish that you find on the shelf. Regular ketchup and mustard. Meat flavorings and tenderizers. Bouillon cubes. Hot sauce and Tabasco sauce. Premade or packaged marinades. Premade or packaged taco seasonings. Relishes. Regular salad dressings. Salsa. Potato and tortilla chips. Corn chips and puffs. Salted popcorn and pretzels. Canned or dried soups. Pizza. Frozen entrees and  pot pies. Summary  Eating less sodium can help lower your blood pressure, reduce swelling, and protect your heart, liver, and kidneys.  Most people on this plan should limit their sodium intake to 1,500-2,000 mg (milligrams) of sodium each day.  Canned, boxed, and frozen foods are high in sodium. Restaurant foods, fast foods, and pizza are also very high in sodium. You also get sodium by adding salt to food.  Try to cook at home, eat more fresh fruits and vegetables, and eat less fast food, canned, processed, or prepared foods. This information is not intended to replace advice given to you by your health care provider. Make sure you discuss any questions you have with your health care provider. Document Released: 01/21/2002 Document Revised: 07/25/2016 Document Reviewed: 07/25/2016 Elsevier Interactive Patient Education  [employer].

## 2017-10-11 ENCOUNTER — Encounter (HOSPITAL_COMMUNITY): Payer: Self-pay

## 2017-10-13 ENCOUNTER — Encounter (HOSPITAL_COMMUNITY)
Admission: RE | Admit: 2017-10-13 | Discharge: 2017-10-13 | Disposition: A | Discharge status: Home / Self Care | Payer: Self-pay | Source: Ambulatory Visit | Attending: Internal Medicine | Admitting: Internal Medicine

## 2017-10-13 DIAGNOSIS — Z953 Presence of xenogenic heart valve: Secondary | ICD-10-CM | POA: Insufficient documentation

## 2017-10-16 ENCOUNTER — Encounter (HOSPITAL_COMMUNITY)
Admission: RE | Admit: 2017-10-16 | Discharge: 2017-10-16 | Disposition: A | Discharge status: Home / Self Care | Payer: Self-pay | Source: Ambulatory Visit | Attending: Internal Medicine | Admitting: Internal Medicine

## 2017-10-18 ENCOUNTER — Encounter (HOSPITAL_COMMUNITY)
Admission: RE | Admit: 2017-10-18 | Discharge: 2017-10-18 | Disposition: A | Discharge status: Home / Self Care | Payer: Self-pay | Source: Ambulatory Visit | Attending: Internal Medicine | Admitting: Internal Medicine

## 2017-10-20 ENCOUNTER — Encounter (HOSPITAL_COMMUNITY): Payer: Self-pay

## 2017-10-23 ENCOUNTER — Encounter (HOSPITAL_COMMUNITY)
Admission: RE | Admit: 2017-10-23 | Discharge: 2017-10-23 | Disposition: A | Discharge status: Home / Self Care | Payer: Self-pay | Source: Ambulatory Visit | Attending: Internal Medicine | Admitting: Internal Medicine

## 2017-10-25 ENCOUNTER — Encounter (HOSPITAL_COMMUNITY)
Admission: RE | Admit: 2017-10-25 | Discharge: 2017-10-25 | Disposition: A | Discharge status: Home / Self Care | Payer: Self-pay | Source: Ambulatory Visit | Attending: Internal Medicine | Admitting: Internal Medicine

## 2017-10-27 ENCOUNTER — Encounter (HOSPITAL_COMMUNITY): Payer: Self-pay

## 2017-10-30 ENCOUNTER — Encounter (HOSPITAL_COMMUNITY)
Admission: RE | Admit: 2017-10-30 | Discharge: 2017-10-30 | Disposition: A | Discharge status: Home / Self Care | Payer: Self-pay | Source: Ambulatory Visit | Attending: Internal Medicine | Admitting: Internal Medicine

## 2017-11-01 ENCOUNTER — Encounter (HOSPITAL_COMMUNITY)
Admission: RE | Admit: 2017-11-01 | Discharge: 2017-11-01 | Disposition: A | Discharge status: Home / Self Care | Payer: Self-pay | Source: Ambulatory Visit | Attending: Internal Medicine | Admitting: Internal Medicine

## 2017-11-03 ENCOUNTER — Encounter (HOSPITAL_COMMUNITY)
Admission: RE | Admit: 2017-11-03 | Discharge: 2017-11-03 | Disposition: A | Discharge status: Home / Self Care | Payer: Self-pay | Source: Ambulatory Visit | Attending: Internal Medicine | Admitting: Internal Medicine

## 2017-11-06 ENCOUNTER — Encounter (HOSPITAL_COMMUNITY)
Admission: RE | Admit: 2017-11-06 | Discharge: 2017-11-06 | Disposition: A | Discharge status: Home / Self Care | Payer: Self-pay | Source: Ambulatory Visit | Attending: Internal Medicine | Admitting: Internal Medicine

## 2017-11-08 ENCOUNTER — Encounter (HOSPITAL_COMMUNITY)
Admission: RE | Admit: 2017-11-08 | Discharge: 2017-11-08 | Disposition: A | Discharge status: Home / Self Care | Payer: Self-pay | Source: Ambulatory Visit | Attending: Internal Medicine | Admitting: Internal Medicine

## 2017-11-10 ENCOUNTER — Encounter (HOSPITAL_COMMUNITY)
Admission: RE | Admit: 2017-11-10 | Discharge: 2017-11-10 | Disposition: A | Discharge status: Home / Self Care | Payer: Self-pay | Source: Ambulatory Visit | Attending: Internal Medicine | Admitting: Internal Medicine

## 2017-11-13 ENCOUNTER — Encounter (HOSPITAL_COMMUNITY)
Admission: RE | Admit: 2017-11-13 | Discharge: 2017-11-13 | Disposition: A | Discharge status: Home / Self Care | Payer: Self-pay | Source: Ambulatory Visit | Attending: Internal Medicine | Admitting: Internal Medicine

## 2017-11-13 DIAGNOSIS — D151 Benign neoplasm of heart: Secondary | ICD-10-CM | POA: Insufficient documentation

## 2017-11-13 DIAGNOSIS — Z954 Presence of other heart-valve replacement: Secondary | ICD-10-CM | POA: Insufficient documentation

## 2017-11-15 ENCOUNTER — Encounter (HOSPITAL_COMMUNITY)
Admission: RE | Admit: 2017-11-15 | Discharge: 2017-11-15 | Disposition: A | Discharge status: Home / Self Care | Payer: Self-pay | Source: Ambulatory Visit | Attending: Internal Medicine | Admitting: Internal Medicine

## 2017-11-17 ENCOUNTER — Encounter (HOSPITAL_COMMUNITY)
Admission: RE | Admit: 2017-11-17 | Discharge: 2017-11-17 | Disposition: A | Discharge status: Home / Self Care | Payer: Self-pay | Source: Ambulatory Visit | Attending: Internal Medicine | Admitting: Internal Medicine

## 2017-11-20 ENCOUNTER — Encounter (HOSPITAL_COMMUNITY): Payer: Self-pay

## 2017-11-22 ENCOUNTER — Ambulatory Visit (INDEPENDENT_AMBULATORY_CARE_PROVIDER_SITE_OTHER): Payer: Medicare Other | Admitting: *Deleted

## 2017-11-22 ENCOUNTER — Encounter (HOSPITAL_COMMUNITY)
Admission: RE | Admit: 2017-11-22 | Discharge: 2017-11-22 | Disposition: A | Discharge status: Home / Self Care | Payer: Self-pay | Source: Ambulatory Visit | Attending: Internal Medicine | Admitting: Internal Medicine

## 2017-11-22 DIAGNOSIS — I442 Atrioventricular block, complete: Secondary | ICD-10-CM

## 2017-11-22 NOTE — Progress Notes (Signed)
Remote pacemaker transmission.   

## 2017-11-23 ENCOUNTER — Encounter: Payer: Self-pay | Admitting: Cardiology

## 2017-11-24 ENCOUNTER — Encounter (HOSPITAL_COMMUNITY): Payer: Self-pay

## 2017-11-27 ENCOUNTER — Encounter (HOSPITAL_COMMUNITY): Payer: Self-pay

## 2017-11-27 DIAGNOSIS — Z1389 Encounter for screening for other disorder: Secondary | ICD-10-CM | POA: Diagnosis not present

## 2017-11-27 DIAGNOSIS — Z952 Presence of prosthetic heart valve: Secondary | ICD-10-CM | POA: Diagnosis not present

## 2017-11-27 DIAGNOSIS — N2889 Other specified disorders of kidney and ureter: Secondary | ICD-10-CM | POA: Diagnosis not present

## 2017-11-27 DIAGNOSIS — F0390 Unspecified dementia without behavioral disturbance: Secondary | ICD-10-CM | POA: Diagnosis not present

## 2017-11-27 DIAGNOSIS — E1122 Type 2 diabetes mellitus with diabetic chronic kidney disease: Secondary | ICD-10-CM | POA: Diagnosis not present

## 2017-11-27 DIAGNOSIS — E7800 Pure hypercholesterolemia, unspecified: Secondary | ICD-10-CM | POA: Diagnosis not present

## 2017-11-27 DIAGNOSIS — N183 Chronic kidney disease, stage 3 (moderate): Secondary | ICD-10-CM | POA: Diagnosis not present

## 2017-11-27 DIAGNOSIS — I10 Essential (primary) hypertension: Secondary | ICD-10-CM | POA: Diagnosis not present

## 2017-11-27 DIAGNOSIS — Z0000 Encounter for general adult medical examination without abnormal findings: Secondary | ICD-10-CM | POA: Diagnosis not present

## 2017-11-29 ENCOUNTER — Encounter (HOSPITAL_COMMUNITY)
Admission: RE | Admit: 2017-11-29 | Discharge: 2017-11-29 | Disposition: A | Discharge status: Home / Self Care | Payer: Self-pay | Source: Ambulatory Visit | Attending: Internal Medicine | Admitting: Internal Medicine

## 2017-12-01 ENCOUNTER — Encounter (HOSPITAL_COMMUNITY): Payer: Self-pay

## 2017-12-04 ENCOUNTER — Encounter (HOSPITAL_COMMUNITY)
Admission: RE | Admit: 2017-12-04 | Discharge: 2017-12-04 | Disposition: A | Discharge status: Home / Self Care | Payer: Self-pay | Source: Ambulatory Visit | Attending: Internal Medicine | Admitting: Internal Medicine

## 2017-12-04 ENCOUNTER — Encounter: Payer: Medicare Other | Admitting: Thoracic Surgery (Cardiothoracic Vascular Surgery)

## 2017-12-06 ENCOUNTER — Encounter (HOSPITAL_COMMUNITY): Payer: Self-pay

## 2017-12-08 ENCOUNTER — Encounter (HOSPITAL_COMMUNITY): Payer: Self-pay

## 2017-12-11 ENCOUNTER — Encounter (HOSPITAL_COMMUNITY)
Admission: RE | Admit: 2017-12-11 | Discharge: 2017-12-11 | Disposition: A | Discharge status: Home / Self Care | Payer: Self-pay | Source: Ambulatory Visit | Attending: Internal Medicine | Admitting: Internal Medicine

## 2017-12-11 ENCOUNTER — Encounter: Payer: Self-pay | Admitting: Thoracic Surgery (Cardiothoracic Vascular Surgery)

## 2017-12-11 ENCOUNTER — Ambulatory Visit (INDEPENDENT_AMBULATORY_CARE_PROVIDER_SITE_OTHER): Payer: Medicare Other | Admitting: Thoracic Surgery (Cardiothoracic Vascular Surgery)

## 2017-12-11 VITALS — BP 150/74 | HR 60 | Resp 20 | Ht 67.5 in | Wt 209.0 lb

## 2017-12-11 DIAGNOSIS — Z952 Presence of prosthetic heart valve: Secondary | ICD-10-CM | POA: Diagnosis not present

## 2017-12-11 DIAGNOSIS — I2510 Atherosclerotic heart disease of native coronary artery without angina pectoris: Secondary | ICD-10-CM | POA: Diagnosis not present

## 2017-12-11 DIAGNOSIS — R911 Solitary pulmonary nodule: Secondary | ICD-10-CM

## 2017-12-11 DIAGNOSIS — I5189 Other ill-defined heart diseases: Secondary | ICD-10-CM

## 2017-12-11 DIAGNOSIS — I519 Heart disease, unspecified: Secondary | ICD-10-CM

## 2017-12-11 DIAGNOSIS — Z953 Presence of xenogenic heart valve: Secondary | ICD-10-CM | POA: Diagnosis not present

## 2017-12-11 NOTE — Progress Notes (Signed)
[address]       [place] [address]             [phone]     CARDIOTHORACIC SURGERY OFFICE NOTE  Referring Provider is Constance Haw, MD  Primary Cardiologist is End, [name], MD PCP is [name], MD   HPI:  Patient is a 77 year old African-American male with history of aortic stenosis, complete heart block status post permanent pacemaker placement, long-standing hypertension, hyperlipidemia, tobacco abuse, type 2 diabetes mellitus, and mild dementia who returns to the office today for routine follow-up status post resection of left atrial mass which turned out to be benign dystrophic fibrotic tissue with calcification and aortic valve replacement using a rapid deployment bovine pericardial tissue valve on 12/02/2016.  He was last seen in our office on April 03, 2017 at which time he was doing well from a cardiac standpoint.  Shortly after that he underwent CT scan of the chest to follow-up multiple small benign-appearing pulmonary nodule seen on preoperative imaging studies.  All nodules remain stable at that time.  Since then he has remained quite stable from a cardiac standpoint and has been seen in follow-up by Dr. Saunders Revel more recently by [name] at [facility].  He returns to our office today with his son for routine follow-up.  He reports stable symptoms of mild exertional shortness of breath.  He states that he is only "slightly limited" by exertional shortness of breath.  He was recently in [place] visiting family and reportedly did fairly well although he did get out of breath with long walks.  He denies resting shortness of breath, PND, orthopnea, or lower extremity edema.  He is never had any chest pain or chest tightness either with activity or at rest.  He is living at home alone and is gone back to driving an automobile.  He admits to some problems with memory loss but he claims to be getting along reasonably well.    Current  Outpatient Medications  Medication Sig Dispense Refill  . amLODipine (NORVASC) 10 MG tablet Take 10 mg by mouth daily.  11  . amoxicillin (AMOXIL) 500 MG tablet Take 4 tablets (2 grams) by mouth 30-60 minutes prior to dental visit. 12 tablet 1  . aspirin EC 81 MG tablet Take 81 mg by mouth daily.    . carvedilol (COREG) 6.25 MG tablet TAKE 1 TABLET BY MOUTH TWICE A DAY 60 tablet 6  . cholecalciferol (VITAMIN D) 1000 units tablet Take 1,000 Units by mouth daily.    . iron polysaccharides (NIFEREX) 150 MG capsule Take 1 capsule (150 mg total) by mouth daily.    Marland Kitchen losartan (COZAAR) 100 MG tablet Take 1 tablet (100 mg total) by mouth daily. 90 tablet 3  . Tetrahydrozoline HCl (VISINE OP) Apply to eye as directed.     No current facility-administered medications for this visit.       Physical Exam:   BP (!) 150/74   Pulse 60   Resp 20   Ht 5' 7.5" (1.715 m)   Wt 209 lb (94.8 kg)   SpO2 99% Comment: RA  BMI 32.25 kg/m   General:  Elderly but well-appearing  Chest:   Clear to auscultation  CV:   Regular rate and rhythm with grade 3/6 systolic murmur heard best at the right sternal border  Incisions:  Completely healed  Abdomen:  Soft nontender  Extremities:  Warm and well-perfused  Diagnostic Tests:  CT CHEST WITHOUT CONTRAST  TECHNIQUE: Multidetector CT imaging of the chest was performed following the standard protocol without IV contrast.  COMPARISON:  Chest CT 11/26/2016.  FINDINGS: Cardiovascular: Heart size is normal. Mild diffuse pericardial thickening, new compared to the prior study. No pericardial calcifications. There is aortic atherosclerosis, as well as atherosclerosis of the great vessels of the mediastinum and the coronary arteries, including calcified atherosclerotic plaque in the left main, left anterior descending, left circumflex and right coronary arteries. Implanted aortic valve. Calcifications of the mitral annulus. Left-sided pacemaker device in  place with lead tips terminating in the right atrial appendage and right ventricular apex.  Mediastinum/Nodes: No pathologically enlarged mediastinal or hilar lymph nodes. Please note that accurate exclusion of hilar adenopathy is limited on noncontrast CT scans. Esophagus is unremarkable in appearance. No axillary lymphadenopathy.  Lungs/Pleura: Previously noted left lower lobe nodule appears stable to similar in size, currently measuring 12 x 6 mm with persistent surrounding halo of ground-glass attenuation. 9 x 5 mm (mean diameter of 7 mm) subpleural nodule in the periphery of the right upper lobe (axial image 36 of series 5) appears unchanged. Several other scattered tiny pulmonary nodules are noted elsewhere in the lungs bilaterally, likely present on the prior study, but better demonstrated on today's examination (2.5 mm axial reconstructions on today's study versus 5 mm reconstructions on the prior study). Patchy areas of peripheral predominant ground-glass attenuation, septal thickening, cylindrical bronchiectasis and peripheral bronchiolectasis are noted throughout the lungs bilaterally, concerning for underlying interstitial lung disease. Mild diffuse bronchial wall thickening with mild centrilobular and paraseptal emphysema. No pleural effusions.  Upper Abdomen: Aortic atherosclerosis. Multiple renal lesions bilaterally, incompletely characterized on today's noncontrast CT examination, but similar to the prior study (please see report for MRI of the abdomen 02/02/2017 for better characterization). Aortic atherosclerosis.  Musculoskeletal: Median sternotomy wires. There are no aggressive appearing lytic or blastic lesions noted in the visualized portions of the skeleton.  IMPRESSION: 1. Multiple pulmonary nodules in the lungs bilaterally, stable compared to the prior examination. This stability is reassuring, however, close attention on followup studies is again  recommended. Specifically, repeat high-resolution chest CT is recommended in 12 months to assess for the stability these nodules, and to re-evaluate the interstitial lung changes which are concerning for interstitial lung disease. 2. Aortic atherosclerosis, in addition to left main and 3 vessel coronary artery disease. Assessment for potential risk factor modification, dietary therapy or pharmacologic therapy may be warranted, if clinically indicated. 3. Mild diffuse bronchial wall thickening with mild centrilobular and paraseptal emphysema. 4. Additional incidental findings, as above.  Aortic Atherosclerosis (ICD10-I70.0) and Emphysema (ICD10-J43.9).   Electronically Signed   By: Vinnie Langton M.D.   On: 04/14/2017 13:46   Impression:  Patient is doing well approximately 1 year status post aortic valve replacement using bioprosthetic tissue valve with resection of aortic valve mass.  Plan:  We have not recommended any change the patient's current medications.  I have encouraged the patient to continue to increase his activity and we have discussed the many benefits of regular exercise and heart healthy diet.  The patient has been reminded regarding the importance of dental hygiene and the lifelong need for antibiotic prophylaxis for all dental cleanings and other related invasive procedures.  The patient will continue to follow-up intermittently with Dr. Saunders Revel and return to see [place] should specific problems or questions arise.  We will have the patient return in 6 months for one final follow-up CT scan of the  chest to ensure stability of his benign appearing pulmonary nodules.  I spent in excess of 15 minutes during the conduct of this office consultation and >50% of this time involved direct face-to-face encounter with the patient for counseling and/or coordination of their care.    Valentina Gu. Roxy Manns, MD 12/11/2017 1:48 PM

## 2017-12-11 NOTE — Patient Instructions (Addendum)

## 2017-12-13 ENCOUNTER — Encounter (HOSPITAL_COMMUNITY)
Admission: RE | Admit: 2017-12-13 | Discharge: 2017-12-13 | Disposition: A | Discharge status: Home / Self Care | Payer: Self-pay | Source: Ambulatory Visit | Attending: Internal Medicine | Admitting: Internal Medicine

## 2017-12-13 DIAGNOSIS — Z954 Presence of other heart-valve replacement: Secondary | ICD-10-CM | POA: Insufficient documentation

## 2017-12-13 DIAGNOSIS — D151 Benign neoplasm of heart: Secondary | ICD-10-CM | POA: Insufficient documentation

## 2017-12-15 ENCOUNTER — Encounter (HOSPITAL_COMMUNITY)
Admission: RE | Admit: 2017-12-15 | Discharge: 2017-12-15 | Disposition: A | Discharge status: Home / Self Care | Payer: Self-pay | Source: Ambulatory Visit | Attending: Internal Medicine | Admitting: Internal Medicine

## 2017-12-18 ENCOUNTER — Encounter (HOSPITAL_COMMUNITY)
Admission: RE | Admit: 2017-12-18 | Discharge: 2017-12-18 | Disposition: A | Discharge status: Home / Self Care | Payer: Self-pay | Source: Ambulatory Visit | Attending: Internal Medicine | Admitting: Internal Medicine

## 2017-12-20 ENCOUNTER — Encounter (HOSPITAL_COMMUNITY)
Admission: RE | Admit: 2017-12-20 | Discharge: 2017-12-20 | Disposition: A | Discharge status: Home / Self Care | Payer: Self-pay | Source: Ambulatory Visit | Attending: Internal Medicine | Admitting: Internal Medicine

## 2017-12-21 LAB — CUP PACEART REMOTE DEVICE CHECK
Battery Remaining Longevity: 119 mo
Brady Statistic AP VP Percent: 66.8 %
Brady Statistic AP VS Percent: 0.01 %
Brady Statistic AS VP Percent: 33.18 %
Implantable Lead Location: 753859
Implantable Lead Location: 753860
Implantable Lead Model: 5076
Implantable Lead Model: 5076
Lead Channel Impedance Value: 247 Ohm
Lead Channel Impedance Value: 361 Ohm
Lead Channel Pacing Threshold Amplitude: 0.625 V
Lead Channel Pacing Threshold Pulse Width: 0.4 ms
Lead Channel Sensing Intrinsic Amplitude: 1.75 mV
Lead Channel Sensing Intrinsic Amplitude: 1.75 mV
Lead Channel Sensing Intrinsic Amplitude: 15.375 mV
Lead Channel Sensing Intrinsic Amplitude: 16.75 mV
Lead Channel Setting Pacing Amplitude: 2.5 V
Lead Channel Setting Pacing Pulse Width: 0.4 ms
MDC IDC LEAD IMPLANT DT: 20180409
MDC IDC LEAD IMPLANT DT: 20180409
MDC IDC MSMT BATTERY VOLTAGE: 3.02 V
MDC IDC MSMT LEADCHNL RA IMPEDANCE VALUE: 399 Ohm
MDC IDC MSMT LEADCHNL RA PACING THRESHOLD AMPLITUDE: 1 V
MDC IDC MSMT LEADCHNL RA PACING THRESHOLD PULSEWIDTH: 0.4 ms
MDC IDC MSMT LEADCHNL RV IMPEDANCE VALUE: 418 Ohm
MDC IDC PG IMPLANT DT: 20180409
MDC IDC SESS DTM: 20190410052004
MDC IDC SET LEADCHNL RA PACING AMPLITUDE: 2 V
MDC IDC SET LEADCHNL RV SENSING SENSITIVITY: 2.8 mV
MDC IDC STAT BRADY AS VS PERCENT: 0.02 %
MDC IDC STAT BRADY RA PERCENT PACED: 66.75 %
MDC IDC STAT BRADY RV PERCENT PACED: 99.97 %

## 2017-12-22 ENCOUNTER — Encounter (HOSPITAL_COMMUNITY): Payer: Self-pay

## 2017-12-25 ENCOUNTER — Encounter (HOSPITAL_COMMUNITY): Payer: Self-pay

## 2017-12-27 ENCOUNTER — Encounter (HOSPITAL_COMMUNITY)
Admission: RE | Admit: 2017-12-27 | Discharge: 2017-12-27 | Disposition: A | Discharge status: Home / Self Care | Payer: Self-pay | Source: Ambulatory Visit | Attending: Internal Medicine | Admitting: Internal Medicine

## 2017-12-29 ENCOUNTER — Encounter (HOSPITAL_COMMUNITY)
Admission: RE | Admit: 2017-12-29 | Discharge: 2017-12-29 | Disposition: A | Discharge status: Home / Self Care | Payer: Medicare Other | Source: Ambulatory Visit | Attending: Internal Medicine | Admitting: Internal Medicine

## 2018-01-01 ENCOUNTER — Encounter (HOSPITAL_COMMUNITY): Payer: Self-pay

## 2018-01-03 ENCOUNTER — Encounter (HOSPITAL_COMMUNITY)
Admission: RE | Admit: 2018-01-03 | Discharge: 2018-01-03 | Disposition: A | Discharge status: Home / Self Care | Payer: Self-pay | Source: Ambulatory Visit | Attending: Internal Medicine | Admitting: Internal Medicine

## 2018-01-05 ENCOUNTER — Encounter (HOSPITAL_COMMUNITY)
Admission: RE | Admit: 2018-01-05 | Discharge: 2018-01-05 | Disposition: A | Discharge status: Home / Self Care | Payer: Self-pay | Source: Ambulatory Visit | Attending: Internal Medicine | Admitting: Internal Medicine

## 2018-01-08 NOTE — Progress Notes (Deleted)
Cardiology Office Note:    Date:  01/08/2018   ID:  James Curtis, DOB 01-07-1941, MRN [id number]  PCP:  James Carol, MD  Cardiologist:  Nelva Bush, MD  Electrophysiologist:  Allegra Lai, MD  Referring MD: James Carol, MD   No chief complaint on file. ***  History of Present Illness:    James Curtis is a 77 y.o. 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  He was last seen in 09/2017.     James Curtis returns for follow up on his blood pressure.  ***  Prior CV studies:   The following studies were reviewed today:  ***Echo 01/26/17 EF 40-45, inferior and inferoseptal hypokinesis/akinesis, severe LVH, grade 1 diastolic dysfunction,s/p AVR with mean 16,MAC, severe LAE, mildly reduced RVSF  Intraoperative TEE 12/02/16 Severe concentric LVH, EF 60-65, mild to moderate aortic stenosis, mild MR  R/L heart cath 11/25/16 LAD irregularities LCx mild disease; OM3 40 RCA proximal 50 RA 4, mean PA 19, PCWP mean 9, PVR 1.3 WU Mild to mod AS - Mean AV gradient 22, AVA 2 cm; mean gradient on pullback 16, AVA 2.3 cm  ABIs 11/25/16 ABI&'s of the right lower extremity indicate mild/boarderline moderate peripheral arterial disease with monophasic flow. ABI&'s of the left lower extremity are within normal limits however the dorsal pedis demonstrates dampened monophasic flow.  R 0.8' L 1.06  TEE 11/23/16 Severe concentric LVH, EF 60-65, MAC, 16 x 4 mm mobile echodensity on atrial basal surface of posterior mitral valve, mild MR  Carotid US 11/20/16 Bilateral ICA 1-39  Echo 11/20/16 Mild concentric LVH, vigorous LVF, EF 65-70, normal wall motion, grade 1 diastolic dysfunction, moderate aortic stenosis (mean 26, peak 62), severe MAC,  moderate mitral stenosis (mean 5), mild MR, 12 x 7 mm mobile echodensity attached to the posterior mitral valve leaflet,mild LAE, mild TR  Past Medical History:  Diagnosis Date  . Aortic stenosis    a. 11/20/2016 Echo: mod AS;  b. 11/23/2016 TEE restricted mobility of noncoronary cusp;  c. 11/2016 Cath: mean grad 61mHg w/ dual lumen catheter, AVA 2.0cm^2; mean gradient by pullback = 136mg, AVA 2.3cm^2.  . Cardiomyopathy ([facility]   Echo 6/18: EF 40-45, inf/inf-septal HK-AK, Gr 1 DD, severe LVH, MAC, severe LAE, mildly reduced RVSF  . Car[place]id arterial disease ([facility]   a. 11/2016 Carotid U/S: bilat 1-39% ICA stenosis.  . Chronic kidney disease   . Complete heart block ([facility] [place]   a. 11/21/2016 s/p MDT AzSantina EvansR MRI SureScan (ser # [id number]).  . Dementia    mild  . Family history of adverse reaction to anesthesia    Son [name] a difficult time waking up  . Heart murmur   . Hypercholesteremia   . Hypertension    benign  . Incidental pulmonary nodule, greater than or equal to 85m53m needs f/u scan 3 months 11/28/2016   14 x 8 mm (mean diameter of 11 mm) left lower lobe pulmonary nodule with some surrounding ground-glass attenuation (axial image 42 of series 6). 9 x 5 mm (mean diameter of 7 mm) subpleural nodule in the periphery of the right upper lobe (axial image 20 of series 6). A few patchy areas of peripheral predominant ground-glass attenuation and subpleural reticulation are noted, scattered randomly throu  . Left atrial mass   . 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 11/2009 TEE: EF 60-65%, restricted mobility of noncoronary AoV cusp, 16x4 mm mobile Ca2+ echodensity on the atrial basal surface of the posterior MV leaflet, mild MR, no LA/LAA thrombus.  . Non-obstructive CAD (coronary artery disease)    a. 11/2016 Cath: LM large, nl, LAD min irregs, LCX large, min irregs, OM3 40, RCA large, 65m   . Pre-diabetes   . Prostate cancer ([facility]    s/p radiation  . Renal mass of unknown  nature - needs f/u MRI 11/28/2016   Multiple renal lesions are noted in the kidneys bilaterally. Although several of these lesions are low-attenuation, compatible with cysts, other lesions are intermediate to high attenuation, potentially enhancing  . S/P aortic valve replacement with bioprosthetic valve 12/02/2016   23 mm Edwards Intuity rapid deployment bovine pericardial tissue valve  . S/P resection of left atrial mass 12/02/2016   Benign dystrophic fibrotic tissue with calcification attached to posterior mitral annulus by thin fibrotic stalk  . TIA (transient ischemic attack)    a. 11/2016 in setting of complete heart block.   Surgical Hx: The patient  has a past surgical history that includes PACEMAKER IMPLANT (N/A, 11/21/2016); TEE without cardioversion (N/A, 11/23/2016); RIGHT/LEFT HEART CATH AND CORONARY ANGIOGRAPHY (N/A, 11/25/2016); Excision of atrial myxoma (N/A, 12/02/2016); TEE without cardioversion (N/A, 12/02/2016); and Aortic valve replacement (N/A, 12/02/2016).   Current Medications: No outpatient medications have been marked as taking for the 01/09/18 encounter (Appointment) with WRichardson DoppT, PA-C.     Allergies:   Patient has no known allergies.   Social History   Tobacco Use  . Smoking status: Former Smoker    Packs/day: 0.25    Types: Cigarettes    Last attempt to quit: 11/16/2016    Years since quitting: 1.1  . Smokeless tobacco: Never Used  Substance Use Topics  . Alcohol use: No  . Drug use: No     Family Hx: The patient's family history includes Cancer in his father.  ROS:   Please see the history of present illness.    ROS All other systems reviewed and are negative.   EKGs/Labs/Other Test Reviewed:    EKG:  EKG is *** ordered today.  The ekg ordered today demonstrates ***  Recent Labs: 05/16/2017: BUN 21; Potassium 4.4; Sodium 143 08/09/2017: Creatinine, Ser 1.64   Recent Lipid Panel Lab Results  Component Value Date/Time   CHOL 106 11/19/2016 03:23  AM   TRIG 97 11/19/2016 03:23 AM   HDL 27 (L) 11/19/2016 03:23 AM   CHOLHDL 3.9 11/19/2016 03:23 AM   LDLCALC 60 11/19/2016 03:23 AM    Physical Exam:    VS:  There were no vitals taken for this visit.    Wt Readings from Last 3 Encounters:  12/11/17 209 lb (94.8 kg)  10/10/17 202 lb 1.9 oz (91.7 kg)  09/04/17 214 lb 6.4 oz (97.3 kg)     ***Physical Exam  ASSESSMENT & PLAN:    Essential hypertension  Chronic combined systolic and diastolic CHF (congestive heart failure) (HCC)  Coronary artery disease involving native coronary artery of native heart without angina pectoris  S/P AVR (aortic valve replacement)  Chronic kidney disease, stage III (moderate) ([facility]***  1.  Essential hypertension Blood pressure remains above target.  I had a long discussion with the patient and his son today regarding the rationale for strict blood pressure control.  We discussed that his current target is <130/80.  He prefers to not  add any medications at this time.  His son notes that he is trying to get him off of all medications.  I have suggested that he try to further limit his salt intake.  He does note eating frozen dinners as well as canned soup.  I have asked him to continue to monitor his blood pressure at home and to bring in readings with him to his next visit.  I have suggested that if his blood pressure does not reach goal by continuing to limit his salt, we should consider adding an additional agent to get a target.  2.  Chronic combined systolic and diastolic CHF (congestive heart failure) (HCC) EF 40-45 by echocardiogram June 2018.  NYHA 2.  Volume appears stable.  Continue beta-blocker, ARB.  3.  Coronary artery disease Mild to moderate nonobstructive CAD by cardiac catheterization April 2018.  He denies symptoms of angina.  Continue aspirin.  I have recommended resuming statin therapy for risk factor modification.  He is currently not interested in resuming statin therapy.  4.   S/P AVR (aortic valve replacement) Status post left atrial mass resection and bioprosthetic AVR in April 2018.  Gradients were stable by echocardiogram June 2018.  Murmur is noticeable on exam.  He does not have symptoms to suggest worsening stenosis.  Consider arranging follow-up echo at next office visit.  5.  Chronic kidney disease, stage III (moderate) (HCC) Renal function has remained fairly stable.  Continue ARB.  We did discuss the importance of good blood pressure control as it relates to preventing advanced kidney disease and, ultimately, the need for dialysis.  6.  Cardiac pacemaker in situ Follow-up with EP as planned.  Dispo:  No follow-ups on file.   Medication Adjustments/Labs and Tests Ordered: Current medicines are reviewed at length with the patient today.  Concerns regarding medicines are outlined above.  Tests Ordered: No orders of the defined types were placed in this encounter.  Medication Changes: No orders of the defined types were placed in this encounter.   Signed, [name], PA-C  01/08/2018 7:47 PM    [facility] [address], [place], [place]  [address] Phone: [phone]; Fax: [phone]

## 2018-01-09 ENCOUNTER — Encounter: Payer: Self-pay | Admitting: Physician Assistant

## 2018-01-09 ENCOUNTER — Ambulatory Visit: Payer: Medicare Other | Admitting: Physician Assistant

## 2018-01-10 ENCOUNTER — Ambulatory Visit (INDEPENDENT_AMBULATORY_CARE_PROVIDER_SITE_OTHER): Payer: Medicare Other | Admitting: Neurology

## 2018-01-10 ENCOUNTER — Encounter: Payer: Self-pay | Admitting: Neurology

## 2018-01-10 ENCOUNTER — Encounter (HOSPITAL_COMMUNITY)
Admission: RE | Admit: 2018-01-10 | Discharge: 2018-01-10 | Disposition: A | Discharge status: Home / Self Care | Payer: Medicare Other | Source: Ambulatory Visit | Attending: Internal Medicine | Admitting: Internal Medicine

## 2018-01-10 VITALS — BP 126/61 | HR 59 | Ht 67.5 in | Wt 206.6 lb

## 2018-01-10 DIAGNOSIS — I059 Rheumatic mitral valve disease, unspecified: Secondary | ICD-10-CM

## 2018-01-10 DIAGNOSIS — Z952 Presence of prosthetic heart valve: Secondary | ICD-10-CM | POA: Diagnosis not present

## 2018-01-10 DIAGNOSIS — I10 Essential (primary) hypertension: Secondary | ICD-10-CM

## 2018-01-10 DIAGNOSIS — E785 Hyperlipidemia, unspecified: Secondary | ICD-10-CM

## 2018-01-10 DIAGNOSIS — G451 Carotid artery syndrome (hemispheric): Secondary | ICD-10-CM

## 2018-01-10 DIAGNOSIS — I058 Other rheumatic mitral valve diseases: Secondary | ICD-10-CM

## 2018-01-10 DIAGNOSIS — Z87891 Personal history of nicotine dependence: Secondary | ICD-10-CM

## 2018-01-10 DIAGNOSIS — I2510 Atherosclerotic heart disease of native coronary artery without angina pectoris: Secondary | ICD-10-CM | POA: Diagnosis not present

## 2018-01-10 NOTE — Patient Instructions (Signed)
-   continue ASA for stroke prevention - follow up with cardiology and follow up with pacemaker. If afib found, will need stronger blood thinners - Follow up with your primary care physician for stroke risk factor modification. Recommend maintain blood pressure goal <130/80, diabetes with hemoglobin A1c goal below 7.0% and lipids with LDL cholesterol goal below 70 mg/dL.  - check BP at home and record - healthy diet and regular exercise - follow up as needed.

## 2018-01-10 NOTE — Progress Notes (Signed)
STROKE NEUROLOGY FOLLOW UP NOTE  NAME: James Curtis DOB: 1941/02/12  REASON FOR VISIT: stroke follow up HISTORY FROM: pt and son and chart  Today we had the pleasure of seeing James Curtis in follow-up at our Neurology Clinic. Pt was accompanied by son.   History Summary James Curtis is a 77 y.o. male with history of HTN, HLD, tobacco smoker and diet-controlled DB admitted on 11/18/16 for slurred speech, confusion and R facial droop. Symptoms resolved quickly. MRI no acute stroke. Stroke workup including MRA, carotid Doppler unremarkable. TTE showed EF 65-70% with mobile echodensity posterior mitral valve. TEE done again showed mitral wall mobile calcified structure with unclear etiology. LDL 60 and A1c 6.2. CVTS consulted and plan for resection. He was also found to have complete heart block confirmed on EKG, cardiology consulted and had a pacemaker placed. He was discharged with aspirin and simvastatin.   Readmitted on 12/02/16, had resection of mitral wall mass, pathology showed calcified febrile take nodule and mitral valve leaflets with extensive calcification. His admission was complicated by hematuria, urinary retention, urethral trauma due to Foley catheter. Eventually he was discharged to SNF.   03/07/17 follow-up - the patient has been doing well. He is at home now. Followed with cardiology and Dr. Roxy Manns, doing well with cardiac function and pacemaker. Has quit smoking. Patient complains of dark stool but he is on iron pills. Family complains of patient lack of activity and social withdrawal. Patient refuses any medication for depression. BP today 126/69   09/04/17 follow up - patient has been doing well.  No recurrent strokelike symptoms.  Family complains of memory loss, easy forget, need time to remember things.  Follow with cardiology and Zocor stopped due to concerns of cognitive impairment, liver or kidney injury.  Patient still has extra PT/OT 3 times per week.  Had both eye  cataract surgery in 07/04/17 and 08/03/17.  BP today in clinic 135/69.  Still on aspirin 81.  Interval History During the interval time, pt has been doing well. No complains today. He follows with Dr. Roxy Manns last month and will follow up in 6 months. He followed with cardiology 3 months ago and no change of medications. Pt back to driving without issue. Still has cardio rehab maintenance program 2-3/week. BP today 126/61.   REVIEW OF SYSTEMS: Full 14 system review of systems performed and notable only for those listed below and in HPI above, all others are negative:  Constitutional:   Cardiovascular:  Ear/Nose/Throat:   Skin:  Eyes:    Respiratory:     Gastroitestinal:   Genitourinary:  Hematology/Lymphatic:   Endocrine:  Musculoskeletal:   Allergy/Immunology:   Neurological:    Psychiatric:    Sleep:   The following represents the patient's updated allergies and side effects list: No Known Allergies  The neurologically relevant items on the patient's problem list were reviewed on today's visit.  Neurologic Examination  A problem focused neurological exam (12 or more points of the single system neurologic examination, vital signs counts as 1 point, cranial nerves count for 8 points) was performed.  Blood pressure 126/61, pulse (!) 59, height 5' 7.5" (1.715 m), weight 206 lb 9.6 oz (93.7 kg).  General - Well nourished, well developed, in no apparent distress.  Ophthalmologic - Fundi not visualized due to eye movement.  Cardiovascular - Regular rate and rhythm with no murmur.  Mental Status -  Level of arousal and orientation to time, place, and person were intact. Language including expression,  naming, repetition, comprehension was assessed and found intact. Fund of Knowledge was assessed and was impaired.  Cranial Nerves II - XII - II - Visual field intact OU. III, IV, VI - Extraocular movements intact. V - Facial sensation intact bilaterally. VII - Facial movement  intact bilaterally. VIII - Hearing & vestibular intact bilaterally. X - Palate elevates symmetrically. XI - Chin turning & shoulder shrug intact bilaterally. XII - Tongue protrusion intact.  Motor Strength - The patient's strength was normal in all extremities and pronator drift was absent.  Bulk was normal and fasciculations were absent.   Motor Tone - Muscle tone was assessed at the neck and appendages and was normal.  Reflexes - The patient's reflexes were 1+ in all extremities and he had no pathological reflexes.  Sensory - Light touch, temperature/pinprick, vibration and proprioception, and Romberg testing were assessed and were normal.    Coordination - The patient had normal movements in the hands and feet with no ataxia or dysmetria.  Tremor was absent.  Gait and Station - walk independently without device, normal gait   Data reviewed: I personally reviewed the images and agree with the radiology interpretations.  Mr Brain 84 Contrast [name] Head Wo Contrast 11/19/2016 1. No acute intracranial abnormality identified. 2. Stable mild chronic microvascular ischemic changes and mild parenchymal volume loss of the brain. Stable small chronic lacunar infarct in left hemi pons. 3. Patent circle of Willis. No high-grade stenosis, large vessel occlusion, or aneurysm identified.   2D Echocardiogram  Impressions:   Hyperdynamic LVEF. Moderate aortic stenosis. Moderate mitral stenosis. The mitral valve is severely thickenedand calcified with restricted leaflet opening. There are severemitral annular calcifications predominantly posterior.There is a mobile echodensity attached to the posterior mitralvalve leaflet/annulus from the left atrial side measuring 12 x 37mm. This might represent a vegetation vs detached calcifications.A TEE is recommended for further evaluation.  Carotid Doppler   There is 1-39% bilateral ICA stenosis. Vertebral artery flow is antegrade.    TEE Left  ventricle: The cavity size was normal. There was severe concentric hypertrophy. Systolic function was normal. The estimated ejection fraction was in the range of 60% to 65%. - Aortic valve: Cusp separation was reduced. Noncoronary cusp mobility was restricted. - Mitral valve: Mildly calcified annulus. Mildly thickened leaflets . There was a 16 x 4 mm mobile calcified echodensity on the atrial basal surface of the posterior mitral valve. Differential includes fibroelastoma or vegetation (checking blood cultures). Chronic vegetations are usually calcified as this mass currently demonstrates, however they are not as mobile as we are seeing. There was mild regurgitation. - Left atrium: No evidence of thrombus in the atrial cavity or appendage. No evidence of thrombus in the appendage. - Right atrium: Pacer wire or catheter noted in right atrium. No evidence of thrombus in the atrial cavity or appendage. - Tricuspid valve: No evidence of vegetation. - Pulmonic valve: No evidence of vegetation.  Component     Latest Ref Rng & Units 11/19/2016  Cholesterol     0 - 200 mg/dL 106  Triglycerides     <150 mg/dL 97  HDL Cholesterol     >40 mg/dL 27 (L)  Total CHOL/HDL Ratio     RATIO 3.9  VLDL     0 - 40 mg/dL 19  LDL (calc)     0 - 99 mg/dL 60  Hemoglobin A1C     4.8 - 5.6 % 6.2 (H)  Mean Plasma Glucose     mg/dL 131  Assessment: As you may recall, he is a 77 y.o. African American male with PMH of HTN, HLD, tobacco smoker and diet-controlled DB admitted on 11/18/16 for slurred speech, confusion and R facial droop. Symptoms resolved quickly. MRI no acute stroke. Stroke workup including MRA, carotid Doppler unremarkable. TTE showed EF 65-70% with mobile echodensity posterior mitral valve. TEE done again showed mitral wall mobile calcified structure with unclear etiology. LDL 60 and A1c 6.2. CVTS consulted and plan for resection. He was also found to have complete heart  block confirmed on EKG, cardiology consulted and had a pacemaker placed. He was discharged with aspirin and simvastatin. 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 Followed with cardiology and Dr. Roxy Manns, doing well with cardiac function and pacemaker. Has quit smoking. Patient refuses any medication for depression. Continued on ASA. Zocor d/c'ed by cardiology for concerning of side effects of memory loss, liver and kidney injury. No complains. Back to driving without issue.   Plan:  - continue ASA for stroke prevention - follow up with cardiology and follow up with pacemaker. If afib found, will need stronger blood thinners - Follow up with your primary care physician for stroke risk factor modification. Recommend maintain blood pressure goal <130/80, diabetes with hemoglobin A1c goal below 7.0% and lipids with LDL cholesterol goal below 70 mg/dL.  - check BP at home and record - healthy diet and regular exercise - follow up as needed.   No orders of the defined types were placed in this encounter.   No orders of the defined types were placed in this encounter.   Patient Instructions  - continue ASA for stroke prevention - follow up with cardiology and follow up with pacemaker. If afib found, will need stronger blood thinners - Follow up with your primary care physician for stroke risk factor modification. Recommend maintain blood pressure goal <130/80, diabetes with hemoglobin A1c goal below 7.0% and lipids with LDL cholesterol goal below 70 mg/dL.  - check BP at home and record - healthy diet and regular exercise - follow up as needed.   Rosalin Hawking, MD PhD [facility] [address] [place], [place] [address] [phone]

## 2018-01-12 ENCOUNTER — Encounter (HOSPITAL_COMMUNITY)
Admission: RE | Admit: 2018-01-12 | Discharge: 2018-01-12 | Disposition: A | Discharge status: Home / Self Care | Payer: Medicare Other | Source: Ambulatory Visit | Attending: Internal Medicine | Admitting: Internal Medicine

## 2018-01-15 ENCOUNTER — Encounter (HOSPITAL_COMMUNITY)
Admission: RE | Admit: 2018-01-15 | Discharge: 2018-01-15 | Disposition: A | Discharge status: Home / Self Care | Payer: Self-pay | Source: Ambulatory Visit | Attending: Internal Medicine | Admitting: Internal Medicine

## 2018-01-15 DIAGNOSIS — Z954 Presence of other heart-valve replacement: Secondary | ICD-10-CM | POA: Insufficient documentation

## 2018-01-15 DIAGNOSIS — D151 Benign neoplasm of heart: Secondary | ICD-10-CM | POA: Insufficient documentation

## 2018-01-17 ENCOUNTER — Encounter (HOSPITAL_COMMUNITY): Payer: Self-pay

## 2018-01-19 ENCOUNTER — Encounter (HOSPITAL_COMMUNITY)
Admission: RE | Admit: 2018-01-19 | Discharge: 2018-01-19 | Disposition: A | Discharge status: Home / Self Care | Payer: Self-pay | Source: Ambulatory Visit | Attending: Internal Medicine | Admitting: Internal Medicine

## 2018-01-22 ENCOUNTER — Encounter (HOSPITAL_COMMUNITY): Payer: Self-pay

## 2018-01-24 ENCOUNTER — Encounter (HOSPITAL_COMMUNITY)
Admission: RE | Admit: 2018-01-24 | Discharge: 2018-01-24 | Disposition: A | Discharge status: Home / Self Care | Payer: Self-pay | Source: Ambulatory Visit | Attending: Internal Medicine | Admitting: Internal Medicine

## 2018-01-26 ENCOUNTER — Encounter (HOSPITAL_COMMUNITY)
Admission: RE | Admit: 2018-01-26 | Discharge: 2018-01-26 | Disposition: A | Discharge status: Home / Self Care | Payer: Self-pay | Source: Ambulatory Visit | Attending: Internal Medicine | Admitting: Internal Medicine

## 2018-01-29 ENCOUNTER — Encounter (HOSPITAL_COMMUNITY)
Admission: RE | Admit: 2018-01-29 | Discharge: 2018-01-29 | Disposition: A | Discharge status: Home / Self Care | Payer: Self-pay | Source: Ambulatory Visit | Attending: Internal Medicine | Admitting: Internal Medicine

## 2018-01-31 ENCOUNTER — Encounter (HOSPITAL_COMMUNITY): Payer: Self-pay

## 2018-02-02 ENCOUNTER — Encounter (HOSPITAL_COMMUNITY)
Admission: RE | Admit: 2018-02-02 | Discharge: 2018-02-02 | Disposition: A | Discharge status: Home / Self Care | Payer: Self-pay | Source: Ambulatory Visit | Attending: Internal Medicine | Admitting: Internal Medicine

## 2018-02-05 ENCOUNTER — Encounter (HOSPITAL_COMMUNITY): Payer: Self-pay

## 2018-02-07 ENCOUNTER — Encounter (HOSPITAL_COMMUNITY): Payer: Self-pay

## 2018-02-09 ENCOUNTER — Encounter (HOSPITAL_COMMUNITY)
Admission: RE | Admit: 2018-02-09 | Discharge: 2018-02-09 | Disposition: A | Discharge status: Home / Self Care | Payer: Self-pay | Source: Ambulatory Visit | Attending: Internal Medicine | Admitting: Internal Medicine

## 2018-02-12 ENCOUNTER — Encounter (HOSPITAL_COMMUNITY): Payer: Self-pay

## 2018-02-12 DIAGNOSIS — Z954 Presence of other heart-valve replacement: Secondary | ICD-10-CM | POA: Insufficient documentation

## 2018-02-12 DIAGNOSIS — D151 Benign neoplasm of heart: Secondary | ICD-10-CM | POA: Insufficient documentation

## 2018-02-14 ENCOUNTER — Encounter (HOSPITAL_COMMUNITY)
Admission: RE | Admit: 2018-02-14 | Discharge: 2018-02-14 | Disposition: A | Discharge status: Home / Self Care | Payer: Self-pay | Source: Ambulatory Visit | Attending: Internal Medicine | Admitting: Internal Medicine

## 2018-02-16 ENCOUNTER — Encounter (HOSPITAL_COMMUNITY): Payer: Self-pay

## 2018-02-19 ENCOUNTER — Encounter (HOSPITAL_COMMUNITY): Payer: Self-pay

## 2018-02-21 ENCOUNTER — Encounter (HOSPITAL_COMMUNITY)
Admission: RE | Admit: 2018-02-21 | Discharge: 2018-02-21 | Disposition: A | Discharge status: Home / Self Care | Payer: Self-pay | Source: Ambulatory Visit | Attending: Internal Medicine | Admitting: Internal Medicine

## 2018-02-21 ENCOUNTER — Ambulatory Visit (INDEPENDENT_AMBULATORY_CARE_PROVIDER_SITE_OTHER): Payer: Medicare Other | Admitting: *Deleted

## 2018-02-21 DIAGNOSIS — I442 Atrioventricular block, complete: Secondary | ICD-10-CM | POA: Diagnosis not present

## 2018-02-21 NOTE — Progress Notes (Signed)
Remote pacemaker transmission.   

## 2018-02-23 ENCOUNTER — Encounter (HOSPITAL_COMMUNITY)
Admission: RE | Admit: 2018-02-23 | Discharge: 2018-02-23 | Disposition: A | Discharge status: Home / Self Care | Payer: Self-pay | Source: Ambulatory Visit | Attending: Internal Medicine | Admitting: Internal Medicine

## 2018-02-26 ENCOUNTER — Encounter (HOSPITAL_COMMUNITY): Payer: Self-pay

## 2018-02-28 ENCOUNTER — Encounter (HOSPITAL_COMMUNITY)
Admission: RE | Admit: 2018-02-28 | Discharge: 2018-02-28 | Disposition: A | Discharge status: Home / Self Care | Payer: Self-pay | Source: Ambulatory Visit | Attending: Internal Medicine | Admitting: Internal Medicine

## 2018-03-02 ENCOUNTER — Encounter (HOSPITAL_COMMUNITY): Payer: Self-pay

## 2018-03-05 ENCOUNTER — Encounter (HOSPITAL_COMMUNITY): Payer: Self-pay

## 2018-03-07 ENCOUNTER — Encounter (HOSPITAL_COMMUNITY)
Admission: RE | Admit: 2018-03-07 | Discharge: 2018-03-07 | Disposition: A | Discharge status: Home / Self Care | Payer: Self-pay | Source: Ambulatory Visit | Attending: Internal Medicine | Admitting: Internal Medicine

## 2018-03-09 ENCOUNTER — Encounter (HOSPITAL_COMMUNITY)
Admission: RE | Admit: 2018-03-09 | Discharge: 2018-03-09 | Disposition: A | Discharge status: Home / Self Care | Payer: Self-pay | Source: Ambulatory Visit | Attending: Internal Medicine | Admitting: Internal Medicine

## 2018-03-12 ENCOUNTER — Encounter (HOSPITAL_COMMUNITY)
Admission: RE | Admit: 2018-03-12 | Discharge: 2018-03-12 | Disposition: A | Discharge status: Home / Self Care | Payer: Self-pay | Source: Ambulatory Visit | Attending: Internal Medicine | Admitting: Internal Medicine

## 2018-03-14 ENCOUNTER — Encounter (HOSPITAL_COMMUNITY): Payer: Self-pay

## 2018-03-16 ENCOUNTER — Encounter (HOSPITAL_COMMUNITY)
Admission: RE | Admit: 2018-03-16 | Discharge: 2018-03-16 | Disposition: A | Discharge status: Home / Self Care | Payer: Self-pay | Source: Ambulatory Visit | Attending: Internal Medicine | Admitting: Internal Medicine

## 2018-03-16 ENCOUNTER — Other Ambulatory Visit: Payer: Self-pay | Admitting: Internal Medicine

## 2018-03-16 DIAGNOSIS — D151 Benign neoplasm of heart: Secondary | ICD-10-CM | POA: Insufficient documentation

## 2018-03-16 DIAGNOSIS — Z954 Presence of other heart-valve replacement: Secondary | ICD-10-CM | POA: Insufficient documentation

## 2018-03-16 NOTE — Telephone Encounter (Signed)
Refill Request.  

## 2018-03-19 ENCOUNTER — Encounter (HOSPITAL_COMMUNITY): Payer: Self-pay

## 2018-03-21 ENCOUNTER — Encounter (HOSPITAL_COMMUNITY): Payer: Self-pay

## 2018-03-22 LAB — CUP PACEART REMOTE DEVICE CHECK
Battery Voltage: 3.01 V
Brady Statistic AP VP Percent: 68.62 %
Brady Statistic AS VP Percent: 31.35 %
Brady Statistic RA Percent Paced: 68.58 %
Date Time Interrogation Session: 20190710051452
Implantable Lead Implant Date: 20180409
Implantable Lead Location: 753860
Implantable Lead Model: 5076
Implantable Pulse Generator Implant Date: 20180409
Lead Channel Impedance Value: 323 Ohm
Lead Channel Pacing Threshold Amplitude: 1 V
Lead Channel Pacing Threshold Pulse Width: 0.4 ms
Lead Channel Sensing Intrinsic Amplitude: 15.375 mV
Lead Channel Setting Pacing Amplitude: 2.25 V
Lead Channel Setting Pacing Pulse Width: 0.4 ms
Lead Channel Setting Sensing Sensitivity: 2.8 mV
MDC IDC LEAD IMPLANT DT: 20180409
MDC IDC LEAD LOCATION: 753859
MDC IDC MSMT BATTERY REMAINING LONGEVITY: 109 mo
MDC IDC MSMT LEADCHNL RA IMPEDANCE VALUE: 247 Ohm
MDC IDC MSMT LEADCHNL RA IMPEDANCE VALUE: 380 Ohm
MDC IDC MSMT LEADCHNL RA SENSING INTR AMPL: 1.625 mV
MDC IDC MSMT LEADCHNL RA SENSING INTR AMPL: 1.625 mV
MDC IDC MSMT LEADCHNL RV IMPEDANCE VALUE: 380 Ohm
MDC IDC MSMT LEADCHNL RV PACING THRESHOLD AMPLITUDE: 0.625 V
MDC IDC MSMT LEADCHNL RV PACING THRESHOLD PULSEWIDTH: 0.4 ms
MDC IDC MSMT LEADCHNL RV SENSING INTR AMPL: 16.75 mV
MDC IDC SET LEADCHNL RV PACING AMPLITUDE: 2.5 V
MDC IDC STAT BRADY AP VS PERCENT: 0.02 %
MDC IDC STAT BRADY AS VS PERCENT: 0.01 %
MDC IDC STAT BRADY RV PERCENT PACED: 99.97 %

## 2018-03-23 ENCOUNTER — Encounter (HOSPITAL_COMMUNITY)
Admission: RE | Admit: 2018-03-23 | Discharge: 2018-03-23 | Disposition: A | Discharge status: Home / Self Care | Payer: Self-pay | Source: Ambulatory Visit | Attending: Internal Medicine | Admitting: Internal Medicine

## 2018-03-26 ENCOUNTER — Encounter (HOSPITAL_COMMUNITY): Payer: Self-pay

## 2018-03-28 ENCOUNTER — Encounter (HOSPITAL_COMMUNITY): Payer: Self-pay

## 2018-03-30 ENCOUNTER — Encounter (HOSPITAL_COMMUNITY)
Admission: RE | Admit: 2018-03-30 | Discharge: 2018-03-30 | Disposition: A | Discharge status: Home / Self Care | Payer: Self-pay | Source: Ambulatory Visit | Attending: Internal Medicine | Admitting: Internal Medicine

## 2018-04-02 ENCOUNTER — Encounter (HOSPITAL_COMMUNITY): Payer: Self-pay

## 2018-04-04 ENCOUNTER — Encounter (HOSPITAL_COMMUNITY): Payer: Self-pay

## 2018-04-06 ENCOUNTER — Encounter (HOSPITAL_COMMUNITY): Payer: Self-pay

## 2018-04-09 ENCOUNTER — Encounter (HOSPITAL_COMMUNITY)
Admission: RE | Admit: 2018-04-09 | Discharge: 2018-04-09 | Disposition: A | Discharge status: Home / Self Care | Payer: Medicare Other | Source: Ambulatory Visit | Attending: Internal Medicine | Admitting: Internal Medicine

## 2018-04-10 ENCOUNTER — Telehealth: Payer: Self-pay | Admitting: *Deleted

## 2018-04-10 NOTE — Telephone Encounter (Signed)
LMTCB/sss  Called to request manual transmission.

## 2018-04-10 NOTE — Telephone Encounter (Signed)
-----   Message from Michae Kava, [place] sent at 04/10/2018  9:35 AM EDT ----- Regarding: FYI Hi Ladies,  Pt is coming in to see [place] tomorrow 04/11/18. Pt had device check 11/2017 and has one coming up 05/2018. Just wanted to give heads up incase he ask for a check. I appreciate all of your help in this matter.  Thank you Arbie Cookey

## 2018-04-11 ENCOUNTER — Encounter: Payer: Self-pay | Admitting: Physician Assistant

## 2018-04-11 ENCOUNTER — Ambulatory Visit (INDEPENDENT_AMBULATORY_CARE_PROVIDER_SITE_OTHER): Payer: Medicare Other | Admitting: Physician Assistant

## 2018-04-11 ENCOUNTER — Encounter (HOSPITAL_COMMUNITY)
Admission: RE | Admit: 2018-04-11 | Discharge: 2018-04-11 | Disposition: A | Discharge status: Home / Self Care | Payer: Medicare Other | Source: Ambulatory Visit | Attending: Internal Medicine | Admitting: Internal Medicine

## 2018-04-11 VITALS — BP 158/74 | HR 62 | Ht 67.5 in | Wt 209.2 lb

## 2018-04-11 DIAGNOSIS — Z950 Presence of cardiac pacemaker: Secondary | ICD-10-CM | POA: Diagnosis not present

## 2018-04-11 DIAGNOSIS — N183 Chronic kidney disease, stage 3 unspecified: Secondary | ICD-10-CM

## 2018-04-11 DIAGNOSIS — I5042 Chronic combined systolic (congestive) and diastolic (congestive) heart failure: Secondary | ICD-10-CM | POA: Diagnosis not present

## 2018-04-11 DIAGNOSIS — I2510 Atherosclerotic heart disease of native coronary artery without angina pectoris: Secondary | ICD-10-CM | POA: Diagnosis not present

## 2018-04-11 DIAGNOSIS — Z952 Presence of prosthetic heart valve: Secondary | ICD-10-CM

## 2018-04-11 DIAGNOSIS — I10 Essential (primary) hypertension: Secondary | ICD-10-CM | POA: Diagnosis not present

## 2018-04-11 NOTE — Progress Notes (Signed)
Cardiology Office Note:    Date:  04/11/2018   ID:  [name], DOB [birth year], MRN [id number]  PCP:  [name], MD  Cardiologist:  Nelva Bush, MD  Electrophysiologist:  Allegra Lai, MD  Referring MD: [name], MD   Chief Complaint  Patient presents with  . Follow-up    CHF, HTN, hx of AVR    History of Present Illness:    James Curtis is a 77 y.o. 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  He was last seen in February 2019.     James Curtis returns for follow-up.  He is here with his son.  He has overall been doing well.  He denies chest discomfort or significant shortness of breath.  He continues to go to the maintenance program at cardiac rehabilitation.  He denies orthopnea, PND.  He does have some mild pedal edema.  He denies syncope.  Prior CV studies:   The following studies were reviewed today:  Echo 01/26/17 EF 40-45, inferior and inferoseptal hypokinesis/akinesis, severe LVH, grade 1 diastolic dysfunction,s/p AVR with mean 16,MAC, severe LAE, mildly reduced RVSF  Intraoperative TEE 12/02/16 Severe concentric LVH, EF 60-65, mild to moderate aortic stenosis, mild MR  R/L heart cath 11/25/16 LAD irregularities LCx mild disease; OM3 40 RCA proximal 50 RA 4, mean PA 19, PCWP mean 9, PVR 1.3 WU Mild to mod AS - Mean AV gradient 22, AVA 2 cm; mean gradient on pullback 16, AVA 2.3 cm  ABIs 11/25/16 ABI&'s of the right lower extremity indicate mild/boarderline moderate peripheral arterial disease with monophasic flow. ABI&'s of the left lower extremity are within normal limits however the dorsal pedis demonstrates dampened monophasic flow.  R 0.8' L 1.06  TEE 11/23/16 Severe concentric LVH,  EF 60-65, MAC, 16 x 4 mm mobile echodensity on atrial basal surface of posterior mitral valve, mild MR  Carotid US 11/20/16 Bilateral ICA 1-39  Echo 11/20/16 Mild concentric LVH, vigorous LVF, EF 65-70, normal wall motion, grade 1 diastolic dysfunction, moderate aortic stenosis (mean 26, peak 62), severe MAC, moderate mitral stenosis (mean 5), mild MR, 12 x 7 mm mobile echodensity attached to the posterior mitral valve leaflet,mild LAE, mild TR  Past Medical History:  Diagnosis Date  . Aortic stenosis    a. 11/20/2016 Echo: mod AS;  b. 11/23/2016 TEE restricted mobility of noncoronary cusp;  c. 11/2016 Cath: mean grad 65mHg w/ dual lumen catheter, AVA 2.0cm^2; mean gradient by pullback = 1943mg, AVA 2.3cm^2.  . [place] ([place]   Echo 6/18: EF 40-45, inf/inf-septal HK-AK, Gr 1 DD, severe LVH, MAC, severe LAE, mildly reduced RVSF  . Car[place]id arterial disease ([place]   a. 11/2016 Carotid U/S: bilat 1-39% ICA stenosis.  . Chronic kidney disease   . [place] heart block ([place]   a. 11/21/2016 s/p MDT AzSantina EvansR MRI SureScan (ser # [id number]).  . Dementia    mild  . Family history of adverse reaction to anesthesia    Son [name] a difficult time waking up  . Heart murmur   . Hypercholesteremia   . Hypertension    benign  . Incidental pulmonary nodule, greater than or equal to 43m59m needs f/u scan 3 months 11/28/2016   14 x 8 mm (mean diameter of 11 mm) left lower lobe pulmonary  nodule with some surrounding ground-glass attenuation (axial image 42 of series 6). 9 x 5 mm (mean diameter of 7 mm) subpleural nodule in the periphery of the right upper lobe (axial image 20 of series 6). A few patchy areas of peripheral predominant ground-glass attenuation and subpleural reticulation are noted, scattered randomly throu  . Left atrial mass   . 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 11/2009 TEE: EF 60-65%, restricted mobility of noncoronary AoV cusp, 16x4 mm mobile Ca2+ echodensity on the  atrial basal surface of the posterior MV leaflet, mild MR, no LA/LAA thrombus.  . Non-obstructive CAD (coronary artery disease)    a. 11/2016 Cath: LM large, nl, LAD min irregs, LCX large, min irregs, OM3 40, RCA large, 15m   . Pre-diabetes   . Prostate cancer ([facility]    s/p radiation  . Renal mass of unknown nature - needs f/u MRI 11/28/2016   Multiple renal lesions are noted in the kidneys bilaterally. Although several of these lesions are low-attenuation, compatible with cysts, other lesions are intermediate to high attenuation, potentially enhancing  . S/P aortic valve replacement with bioprosthetic valve 12/02/2016   23 mm Edwards Intuity rapid deployment bovine pericardial tissue valve  . S/P resection of left atrial mass 12/02/2016   Benign dystrophic fibrotic tissue with calcification attached to posterior mitral annulus by thin fibrotic stalk  . Stroke ([place]   . TIA (transient ischemic attack)    a. 11/2016 in setting of complete heart block.   Surgical Hx: The patient  has a past surgical history that includes PACEMAKER IMPLANT (N/A, 11/21/2016); TEE without cardioversion (N/A, 11/23/2016); RIGHT/LEFT HEART CATH AND CORONARY ANGIOGRAPHY (N/A, 11/25/2016); Excision of atrial myxoma (N/A, 12/02/2016); TEE without cardioversion (N/A, 12/02/2016); and Aortic valve replacement (N/A, 12/02/2016).   Current Medications: Current Meds  Medication Sig  . amLODipine (NORVASC) 10 MG tablet Take 10 mg by mouth daily.  .Marland Kitchenamoxicillin (AMOXIL) 500 MG tablet Take 4 tablets (2 grams) by mouth 30-60 minutes prior to dental visit.  .Marland Kitchenaspirin EC 81 MG tablet Take 81 mg by mouth daily.  . Capsicum, Cayenne, (CAYENNE PEPPER PO) Take by mouth. Takes 1 tablet po as directed. This is a complex tablet along with ginko biloba  . carvedilol (COREG) 6.25 MG tablet TAKE 1 TABLET BY MOUTH TWICE A DAY  . cholecalciferol (VITAMIN D) 1000 units tablet Take 1,000 Units by mouth daily.  . Ginkgo Biloba 40 MG TABS Take by mouth.  Unsure dose. Complex tablet along with cayenne pepper. Takes as directed.  . iron polysaccharides (NIFEREX) 150 MG capsule Take 1 capsule (150 mg total) by mouth daily.  .Lia HoppingLeaf POWD by Does not apply route as directed.  . Tetrahydrozoline HCl (VISINE OP) Apply to eye as directed.     Allergies:   Patient has no known allergies.   Social History   Tobacco Use  . Smoking status: Former Smoker    Packs/day: 0.25    Types: Cigarettes    Last attempt to quit: 11/16/2016    Years since quitting: 1.4  . Smokeless tobacco: Never Used  Substance Use Topics  . Alcohol use: No  . Drug use: No     Family Hx: The patient's family history includes Cancer in his father.  ROS:   Please see the history of present illness.    ROS All other systems reviewed and are negative.   EKGs/Labs/Other Test Reviewed:    EKG:  EKG is  ordered today.  The ekg ordered today demonstrates A sensed, V paced, HR 61  Recent Labs: 05/16/2017: BUN 21; Potassium 4.4; Sodium 143 08/09/2017: Creatinine, Ser 1.64   Recent Lipid Panel Lab Results  Component Value Date/Time   CHOL 106 11/19/2016 03:23 AM   TRIG 97 11/19/2016 03:23 AM   HDL 27 (L) 11/19/2016 03:23 AM   CHOLHDL 3.9 11/19/2016 03:23 AM   LDLCALC 60 11/19/2016 03:23 AM    From [facility] Tool  Cholesterol, total  172.000  11/27/2017 HDL    36.000  11/27/2017 LDL    118.000  11/27/2017 Triglycerides  85.000  11/27/2017 A1C    4.000   11/27/2017 Hemoglobin   9.000   06/09/2017 Creatinine, Serum  1.530   11/27/2017 Potassium   4.300   11/27/2017 ALT (SGPT)   12.000  11/27/2017 TSH    1.450   12/27/2016 INR    1.530   12/02/2016 Platelets   244.000  12/14/2016  Physical Exam:    VS:  BP (!) 158/74   Pulse 62   Ht 5' 7.5" (1.715 m)   Wt 209 lb 4 oz (94.9 kg)   SpO2 98%   BMI 32.29 kg/m     Wt Readings from Last 3 Encounters:  04/11/18 209 lb 4 oz (94.9 kg)  01/10/18 206 lb 9.6 oz (93.7 kg)  12/11/17 209 lb (94.8 kg)     Physical Exam    Constitutional: He is oriented to person, place, and time. He appears well-developed and well-nourished. No distress.  HENT:  Head: Normocephalic and atraumatic.  Eyes: No scleral icterus.  Neck: No JVD present. No thyromegaly present.  Cardiovascular: Normal rate, regular rhythm, S1 normal and S2 normal.  Murmur heard.  Harsh systolic murmur is present with a grade of 2/6 at the upper right sternal border and upper left sternal border. Pulmonary/Chest: Effort normal. He has no wheezes. He has no rales.  Abdominal: Soft. He exhibits no distension.  Musculoskeletal: He exhibits no edema.  Lymphadenopathy:    He has no cervical adenopathy.  Neurological: He is alert and oriented to person, place, and time.  Skin: Skin is warm and dry.  Psychiatric: He has a normal mood and affect.    ASSESSMENT & PLAN:    Chronic combined systolic and diastolic CHF (congestive heart failure) (HCC)  EF 40-45 by echocardiogram June 2018.  NYHA 2.  Volume appears stable.  Continue beta-blocker, ARB.  Arrange follow-up echocardiogram  Coronary artery disease involving native coronary artery of native heart without angina pectoris Mild to moderate nonobstructive coronary disease by cardiac catheterization prior to aortic valve replacement.  He denies angina.  Continue aspirin.  He has declined statin therapy in the past.  S/P AVR (aortic valve replacement)  Arrange follow-up echocardiogram as outlined above.  Continue SBE prophylaxis.  Essential hypertension Repeat blood pressure by me 164/70 on the right, 160/70 on the left.  I had a long discussion with patient and his son regarding optimal blood pressure control.  Goal blood pressure is <130/80.  We discussed several options for blood pressure medication adjustment.  I have recommended increasing carvedilol to 12.5 mg twice daily.  However, the patient's son declines increasing medications any further.  He feels since he is doing well, no changes should  be made.  I tried to stress the importance of good blood pressure control to prevent renal failure, stroke, heart attack, heart failure.  He would like to try to manage salt intake and monitor his blood pressure at home.  I will try to obtain blood pressure readings in cardiac rehabilitation.  If his blood pressure still remains above target after dietary modifications, we should increase his carvedilol to 12.5 mg twice daily.  Chronic kidney disease, stage III (moderate) (HCC) Recent creatinine stable.  Cardiac pacemaker in situ Continue follow-up with EP as planned.   Dispo:  Return in about 6 months (around 10/12/2018) for Routine Follow Up, w/ [name], PA-C.   Medication Adjustments/Labs and Tests Ordered: Current medicines are reviewed at length with the patient today.  Concerns regarding medicines are outlined above.  Tests Ordered: Orders Placed This Encounter  Procedures  . EKG 12-Lead  . ECHOCARDIOGRAM COMPLETE   Medication Changes: No orders of the defined types were placed in this encounter.   Signed, [name], PA-C  04/11/2018 4:49 PM    [facility] [place], [place], [place]  [address] Phone: [phone]; Fax: [phone]

## 2018-04-11 NOTE — Patient Instructions (Signed)
Medication Instructions:  1. Your physician recommends that you continue on your current medications as directed. Please refer to the Current Medication list given to you today.   Labwork: NONE ORDERED TODAY  Testing/Procedures: Your physician has requested that you have an echocardiogram. Echocardiography is a painless test that uses sound waves to create images of your heart. It provides your doctor with information about the size and shape of your heart and how well your heart's chambers and valves are working. This procedure takes approximately one hour. There are no restrictions for this procedure.    Follow-Up: 6 MONTHS WITH SCOTT WEAVER, [facility]   Any Other Special Instructions Will Be Listed Below (If Applicable). MONITOR BLOOD PRESSURE DAILY FOR 2 WEEKS ; AFTER 2 WEEKS SEND OR CALL WITH READINGS   If you need a refill on your cardiac medications before your next appointment, please call your pharmacy.  Low-Sodium Eating Plan Sodium, which is an element that makes up salt, helps you maintain a healthy balance of fluids in your body. Too much sodium can increase your blood pressure and cause fluid and waste to be held in your body. Your health care provider or dietitian may recommend following this plan if you have high blood pressure (hypertension), kidney disease, liver disease, or heart failure. Eating less sodium can help lower your blood pressure, reduce swelling, and protect your heart, liver, and kidneys. What are tips for following this plan? General guidelines  Most people on this plan should limit their sodium intake to 1,500-2,000 mg (milligrams) of sodium each day. Reading food labels  The Nutrition Facts label lists the amount of sodium in one serving of the food. If you eat more than one serving, you must multiply the listed amount of sodium by the number of servings.  Choose foods with less than 140 mg of sodium per serving.  Avoid foods with 300 mg of sodium or  more per serving. Shopping  Look for lower-sodium products, often labeled as "low-sodium" or "no salt added."  Always check the sodium content even if foods are labeled as "unsalted" or "no salt added".  Buy fresh foods. ? Avoid canned foods and premade or frozen meals. ? Avoid canned, cured, or processed meats  Buy breads that have less than 80 mg of sodium per slice. Cooking  Eat more home-cooked food and less restaurant, buffet, and fast food.  Avoid adding salt when cooking. Use salt-free seasonings or herbs instead of table salt or sea salt. Check with your health care provider or pharmacist before using salt substitutes.  Cook with plant-based oils, such as canola, sunflower, or olive oil. Meal planning  When eating at a restaurant, ask that your food be prepared with less salt or no salt, if possible.  Avoid foods that contain MSG (monosodium glutamate). MSG is sometimes added to [place] food, bouillon, and some canned foods. What foods are recommended? The items listed may not be a complete list. Talk with your dietitian about what dietary choices are best for you. Grains Low-sodium cereals, including oats, puffed wheat and rice, and shredded wheat. Low-sodium crackers. Unsalted rice. Unsalted pasta. Low-sodium bread. Whole-grain breads and whole-grain pasta. Vegetables Fresh or frozen vegetables. "No salt added" canned vegetables. "No salt added" tomato sauce and paste. Low-sodium or reduced-sodium tomato and vegetable juice. Fruits Fresh, frozen, or canned fruit. Fruit juice. Meats and other protein foods Fresh or frozen (no salt added) meat, poultry, seafood, and fish. Low-sodium canned tuna and salmon. Unsalted nuts. Dried peas, beans,  and lentils without added salt. Unsalted canned beans. Eggs. Unsalted nut butters. Dairy Milk. Soy milk. Cheese that is naturally low in sodium, such as ricotta cheese, fresh mozzarella, or Swiss cheese Low-sodium or reduced-sodium  cheese. Cream cheese. Yogurt. Fats and oils Unsalted butter. Unsalted margarine with no trans fat. Vegetable oils such as canola or olive oils. Seasonings and other foods Fresh and dried herbs and spices. Salt-free seasonings. Low-sodium mustard and ketchup. Sodium-free salad dressing. Sodium-free light mayonnaise. Fresh or refrigerated horseradish. Lemon juice. Vinegar. Homemade, reduced-sodium, or low-sodium soups. Unsalted popcorn and pretzels. Low-salt or salt-free chips. What foods are not recommended? The items listed may not be a complete list. Talk with your dietitian about what dietary choices are best for you. Grains Instant hot cereals. Bread stuffing, pancake, and biscuit mixes. Croutons. Seasoned rice or pasta mixes. Noodle soup cups. Boxed or frozen macaroni and cheese. Regular salted crackers. Self-rising flour. Vegetables Sauerkraut, pickled vegetables, and relishes. Olives. [place] fries. Onion rings. Regular canned vegetables (not low-sodium or reduced-sodium). Regular canned tomato sauce and paste (not low-sodium or reduced-sodium). Regular tomato and vegetable juice (not low-sodium or reduced-sodium). Frozen vegetables in sauces. Meats and other protein foods Meat or fish that is salted, canned, smoked, spiced, or pickled. Bacon, ham, sausage, hotdogs, corned beef, chipped beef, packaged lunch meats, salt pork, jerky, pickled herring, anchovies, regular canned tuna, sardines, salted nuts. Dairy Processed cheese and cheese spreads. Cheese curds. Blue cheese. Feta cheese. String cheese. Regular cottage cheese. Buttermilk. Canned milk. Fats and oils Salted butter. Regular margarine. Ghee. Bacon fat. Seasonings and other foods Onion salt, garlic salt, seasoned salt, table salt, and sea salt. Canned and packaged gravies. Worcestershire sauce. Tartar sauce. Barbecue sauce. Teriyaki sauce. Soy sauce, including reduced-sodium. Steak sauce. Fish sauce. Oyster sauce. Cocktail sauce.  Horseradish that you find on the shelf. Regular ketchup and mustard. Meat flavorings and tenderizers. Bouillon cubes. Hot sauce and Tabasco sauce. Premade or packaged marinades. Premade or packaged taco seasonings. Relishes. Regular salad dressings. Salsa. Potato and tortilla chips. Corn chips and puffs. Salted popcorn and pretzels. Canned or dried soups. Pizza. Frozen entrees and pot pies. Summary  Eating less sodium can help lower your blood pressure, reduce swelling, and protect your heart, liver, and kidneys.  Most people on this plan should limit their sodium intake to 1,500-2,000 mg (milligrams) of sodium each day.  Canned, boxed, and frozen foods are high in sodium. Restaurant foods, fast foods, and pizza are also very high in sodium. You also get sodium by adding salt to food.  Try to cook at home, eat more fresh fruits and vegetables, and eat less fast food, canned, processed, or prepared foods. This information is not intended to replace advice given to you by your health care provider. Make sure you discuss any questions you have with your health care provider. Document Released: 01/21/2002 Document Revised: 07/25/2016 Document Reviewed: 07/25/2016 Elsevier Interactive Patient Education  [employer].

## 2018-04-13 ENCOUNTER — Encounter (HOSPITAL_COMMUNITY)
Admission: RE | Admit: 2018-04-13 | Discharge: 2018-04-13 | Disposition: A | Discharge status: Home / Self Care | Payer: Medicare Other | Source: Ambulatory Visit | Attending: Internal Medicine | Admitting: Internal Medicine

## 2018-04-17 ENCOUNTER — Other Ambulatory Visit: Payer: Self-pay

## 2018-04-17 ENCOUNTER — Encounter: Payer: Self-pay | Admitting: Physician Assistant

## 2018-04-17 ENCOUNTER — Ambulatory Visit (HOSPITAL_COMMUNITY): Payer: Medicare Other | Attending: Cardiovascular Disease

## 2018-04-17 DIAGNOSIS — E119 Type 2 diabetes mellitus without complications: Secondary | ICD-10-CM | POA: Diagnosis not present

## 2018-04-17 DIAGNOSIS — E785 Hyperlipidemia, unspecified: Secondary | ICD-10-CM | POA: Insufficient documentation

## 2018-04-17 DIAGNOSIS — I5042 Chronic combined systolic (congestive) and diastolic (congestive) heart failure: Secondary | ICD-10-CM | POA: Diagnosis not present

## 2018-04-17 DIAGNOSIS — I110 Hypertensive heart disease with heart failure: Secondary | ICD-10-CM | POA: Insufficient documentation

## 2018-04-17 DIAGNOSIS — Z720 Tobacco use: Secondary | ICD-10-CM | POA: Diagnosis not present

## 2018-04-17 DIAGNOSIS — Z952 Presence of prosthetic heart valve: Secondary | ICD-10-CM | POA: Insufficient documentation

## 2018-04-17 DIAGNOSIS — I082 Rheumatic disorders of both aortic and tricuspid valves: Secondary | ICD-10-CM | POA: Insufficient documentation

## 2018-04-18 ENCOUNTER — Encounter (HOSPITAL_COMMUNITY): Payer: Self-pay

## 2018-04-18 DIAGNOSIS — Z954 Presence of other heart-valve replacement: Secondary | ICD-10-CM | POA: Insufficient documentation

## 2018-04-18 DIAGNOSIS — D151 Benign neoplasm of heart: Secondary | ICD-10-CM | POA: Insufficient documentation

## 2018-04-19 ENCOUNTER — Telehealth: Payer: Self-pay | Admitting: Physician Assistant

## 2018-04-19 DIAGNOSIS — I351 Nonrheumatic aortic (valve) insufficiency: Secondary | ICD-10-CM

## 2018-04-19 DIAGNOSIS — Z952 Presence of prosthetic heart valve: Secondary | ICD-10-CM

## 2018-04-19 NOTE — Telephone Encounter (Signed)
-----   Message from Liliane Shi, [place] sent at 04/18/2018  5:38 PM EDT ----- This study demonstrates:  Normal heart function (ejection fraction).  This is improved from 40-45 on the last echocardiogram.  The prosthetic aortic valve does show some leakage around it.  I had one of the cardiologists review this.  We just need to repeat an echo in 6 mos to make sure it is not getting worse. Medication changes / Follow up studies / Other recommendations:    - Continue current medications  - Arrange repeat Echo in 6 mos (Dx: Hx of AVR, aortic insufficiency) Please send results to the PCP:  [name], MD  [name], PA-C 04/18/2018 5:36 PM

## 2018-04-19 NOTE — Telephone Encounter (Signed)
-----   Message from Liliane Shi, [place] sent at 04/18/2018  5:38 PM EDT ----- This study demonstrates:  Normal heart function (ejection fraction).  This is improved from 40-45 on the last echocardiogram.  The prosthetic aortic valve does show some leakage around it.  I had one of the cardiologists review this.  We just need to repeat an echo in 6 mos to make sure it is not getting worse. Medication changes / Follow up studies / Other recommendations:    - Continue current medications  - Arrange repeat Echo in 6 mos (Dx: Hx of AVR, aortic insufficiency) Please send results to the PCP:  Seward , MD  [name], PA-C 04/18/2018 5:36 PM

## 2018-04-19 NOTE — Telephone Encounter (Signed)
Reviewed blood pressure readings from cardiac rehabilitation. Most blood pressure readings are at or close to goal. Continue current medications and recommendations from last office visit. [name], PA-C    04/19/2018 9:14 AM

## 2018-04-19 NOTE — Telephone Encounter (Signed)
DPR on file to s/w pt's son [name]. [name] has been notified of echo results and findings. Advised of recommendation to repeat echo in 6 months to monitor prosthetic AV which does show some leakage around it. Pt's son is agreeable to echo in 6 months to monitor leakage. Pt's son is agreeable to plan of care. I will forward a copy of results to PCP to Dr. Seward .   I also advised pt's son BP readings from Cardiac Rehab look ok and to continue current medications.

## 2018-04-19 NOTE — Telephone Encounter (Signed)
Left message to go over echo results and recommendations. Will also let pt know [name], PA reviewed BP readings from Cardiac Rehab. PA recommends no changes to be made with medications at this time. BP readings are at or close to goal.

## 2018-04-20 ENCOUNTER — Encounter (HOSPITAL_COMMUNITY): Payer: Self-pay

## 2018-04-23 ENCOUNTER — Encounter (HOSPITAL_COMMUNITY): Payer: Self-pay

## 2018-04-25 ENCOUNTER — Encounter (HOSPITAL_COMMUNITY): Payer: Self-pay

## 2018-04-25 ENCOUNTER — Other Ambulatory Visit: Payer: Self-pay | Admitting: *Deleted

## 2018-04-25 DIAGNOSIS — R911 Solitary pulmonary nodule: Secondary | ICD-10-CM

## 2018-04-27 ENCOUNTER — Encounter (HOSPITAL_COMMUNITY)
Admission: RE | Admit: 2018-04-27 | Discharge: 2018-04-27 | Disposition: A | Discharge status: Home / Self Care | Payer: Self-pay | Source: Ambulatory Visit | Attending: Internal Medicine | Admitting: Internal Medicine

## 2018-04-30 ENCOUNTER — Encounter (HOSPITAL_COMMUNITY)
Admission: RE | Admit: 2018-04-30 | Discharge: 2018-04-30 | Disposition: A | Discharge status: Home / Self Care | Payer: Self-pay | Source: Ambulatory Visit | Attending: Internal Medicine | Admitting: Internal Medicine

## 2018-05-02 ENCOUNTER — Encounter (HOSPITAL_COMMUNITY): Payer: Self-pay

## 2018-05-04 ENCOUNTER — Encounter (HOSPITAL_COMMUNITY)
Admission: RE | Admit: 2018-05-04 | Discharge: 2018-05-04 | Disposition: A | Discharge status: Home / Self Care | Payer: Medicare Other | Source: Ambulatory Visit | Attending: Internal Medicine | Admitting: Internal Medicine

## 2018-05-07 ENCOUNTER — Encounter (HOSPITAL_COMMUNITY): Payer: Self-pay

## 2018-05-09 ENCOUNTER — Encounter (HOSPITAL_COMMUNITY): Payer: Self-pay

## 2018-05-11 ENCOUNTER — Encounter (HOSPITAL_COMMUNITY)
Admission: RE | Admit: 2018-05-11 | Discharge: 2018-05-11 | Disposition: A | Discharge status: Home / Self Care | Payer: Self-pay | Source: Ambulatory Visit | Attending: Internal Medicine | Admitting: Internal Medicine

## 2018-05-14 ENCOUNTER — Encounter (HOSPITAL_COMMUNITY): Payer: Medicare Other

## 2018-05-18 ENCOUNTER — Encounter (HOSPITAL_COMMUNITY)
Admission: RE | Admit: 2018-05-18 | Discharge: 2018-05-18 | Disposition: A | Discharge status: Home / Self Care | Payer: Self-pay | Source: Ambulatory Visit | Attending: Internal Medicine | Admitting: Internal Medicine

## 2018-05-18 DIAGNOSIS — Z954 Presence of other heart-valve replacement: Secondary | ICD-10-CM | POA: Insufficient documentation

## 2018-05-18 DIAGNOSIS — D151 Benign neoplasm of heart: Secondary | ICD-10-CM | POA: Insufficient documentation

## 2018-05-23 ENCOUNTER — Ambulatory Visit (INDEPENDENT_AMBULATORY_CARE_PROVIDER_SITE_OTHER): Payer: Medicare Other | Admitting: *Deleted

## 2018-05-23 DIAGNOSIS — I442 Atrioventricular block, complete: Secondary | ICD-10-CM

## 2018-05-24 NOTE — Progress Notes (Signed)
Remote pacemaker transmission.   

## 2018-05-25 ENCOUNTER — Encounter (HOSPITAL_COMMUNITY)
Admission: RE | Admit: 2018-05-25 | Discharge: 2018-05-25 | Disposition: A | Discharge status: Home / Self Care | Payer: Self-pay | Source: Ambulatory Visit | Attending: Internal Medicine | Admitting: Internal Medicine

## 2018-05-26 ENCOUNTER — Other Ambulatory Visit: Payer: Self-pay | Admitting: Internal Medicine

## 2018-05-28 NOTE — Telephone Encounter (Signed)
Please review for refill.  

## 2018-05-30 DIAGNOSIS — Z950 Presence of cardiac pacemaker: Secondary | ICD-10-CM | POA: Diagnosis not present

## 2018-05-30 DIAGNOSIS — Z952 Presence of prosthetic heart valve: Secondary | ICD-10-CM | POA: Diagnosis not present

## 2018-05-30 DIAGNOSIS — N183 Chronic kidney disease, stage 3 (moderate): Secondary | ICD-10-CM | POA: Diagnosis not present

## 2018-05-30 DIAGNOSIS — I10 Essential (primary) hypertension: Secondary | ICD-10-CM | POA: Diagnosis not present

## 2018-05-30 DIAGNOSIS — D649 Anemia, unspecified: Secondary | ICD-10-CM | POA: Diagnosis not present

## 2018-05-30 DIAGNOSIS — F0390 Unspecified dementia without behavioral disturbance: Secondary | ICD-10-CM | POA: Diagnosis not present

## 2018-05-30 DIAGNOSIS — G459 Transient cerebral ischemic attack, unspecified: Secondary | ICD-10-CM | POA: Diagnosis not present

## 2018-05-30 DIAGNOSIS — E1122 Type 2 diabetes mellitus with diabetic chronic kidney disease: Secondary | ICD-10-CM | POA: Diagnosis not present

## 2018-05-30 DIAGNOSIS — I2510 Atherosclerotic heart disease of native coronary artery without angina pectoris: Secondary | ICD-10-CM | POA: Diagnosis not present

## 2018-06-01 ENCOUNTER — Encounter (HOSPITAL_COMMUNITY)
Admission: RE | Admit: 2018-06-01 | Discharge: 2018-06-01 | Disposition: A | Discharge status: Home / Self Care | Payer: Self-pay | Source: Ambulatory Visit | Attending: Internal Medicine | Admitting: Internal Medicine

## 2018-06-06 ENCOUNTER — Encounter (HOSPITAL_COMMUNITY)
Admission: RE | Admit: 2018-06-06 | Discharge: 2018-06-06 | Disposition: A | Discharge status: Home / Self Care | Payer: Self-pay | Source: Ambulatory Visit | Attending: Cardiology | Admitting: Cardiology

## 2018-06-07 DIAGNOSIS — F0390 Unspecified dementia without behavioral disturbance: Secondary | ICD-10-CM | POA: Diagnosis not present

## 2018-06-07 DIAGNOSIS — D649 Anemia, unspecified: Secondary | ICD-10-CM | POA: Diagnosis not present

## 2018-06-08 ENCOUNTER — Encounter (HOSPITAL_COMMUNITY)
Admission: RE | Admit: 2018-06-08 | Discharge: 2018-06-08 | Disposition: A | Discharge status: Home / Self Care | Payer: Self-pay | Source: Ambulatory Visit | Attending: Cardiology | Admitting: Cardiology

## 2018-06-11 ENCOUNTER — Ambulatory Visit
Admission: RE | Admit: 2018-06-11 | Discharge: 2018-06-11 | Disposition: A | Discharge status: Home / Self Care | Payer: Medicare Other | Source: Ambulatory Visit | Attending: Thoracic Surgery (Cardiothoracic Vascular Surgery) | Admitting: Thoracic Surgery (Cardiothoracic Vascular Surgery)

## 2018-06-11 ENCOUNTER — Encounter: Payer: Medicare Other | Admitting: Thoracic Surgery (Cardiothoracic Vascular Surgery)

## 2018-06-11 DIAGNOSIS — R911 Solitary pulmonary nodule: Secondary | ICD-10-CM

## 2018-06-15 ENCOUNTER — Encounter (HOSPITAL_COMMUNITY)
Admission: RE | Admit: 2018-06-15 | Discharge: 2018-06-15 | Disposition: A | Discharge status: Home / Self Care | Payer: Self-pay | Source: Ambulatory Visit | Attending: Cardiology | Admitting: Cardiology

## 2018-06-15 DIAGNOSIS — D151 Benign neoplasm of heart: Secondary | ICD-10-CM | POA: Insufficient documentation

## 2018-06-15 DIAGNOSIS — Z954 Presence of other heart-valve replacement: Secondary | ICD-10-CM | POA: Insufficient documentation

## 2018-06-20 ENCOUNTER — Ambulatory Visit (INDEPENDENT_AMBULATORY_CARE_PROVIDER_SITE_OTHER): Payer: Medicare Other | Admitting: Thoracic Surgery (Cardiothoracic Vascular Surgery)

## 2018-06-20 ENCOUNTER — Other Ambulatory Visit: Payer: Self-pay | Admitting: *Deleted

## 2018-06-20 ENCOUNTER — Encounter: Payer: Self-pay | Admitting: Thoracic Surgery (Cardiothoracic Vascular Surgery)

## 2018-06-20 VITALS — BP 146/74 | HR 66 | Resp 20 | Ht 67.5 in | Wt 209.0 lb

## 2018-06-20 DIAGNOSIS — I2510 Atherosclerotic heart disease of native coronary artery without angina pectoris: Secondary | ICD-10-CM | POA: Diagnosis not present

## 2018-06-20 DIAGNOSIS — D508 Other iron deficiency anemias: Secondary | ICD-10-CM

## 2018-06-20 DIAGNOSIS — R918 Other nonspecific abnormal finding of lung field: Secondary | ICD-10-CM | POA: Diagnosis not present

## 2018-06-20 DIAGNOSIS — R911 Solitary pulmonary nodule: Secondary | ICD-10-CM | POA: Diagnosis not present

## 2018-06-20 NOTE — Progress Notes (Signed)
James Curtis,James Curtis     CARDIOTHORACIC SURGERY OFFICE NOTE  Referring Provider is James Haw, MD  Primary Cardiologist is James Curtis, James Gave, MD PCP is James Carol, MD   HPI:  Patient is a 77 year old African-American male with history of aortic stenosis, complete heart block status post permanent pacemaker placement, long-standing hypertension, hyperlipidemia, tobacco abuse, type 2 diabetes mellitus, and mild dementia who returns to the office today for follow-up of incidental pulmonary nodules seen on CT scan.  He originally underwent resection of left atrial mass which turned out to be benign dystrophic fibrotic tissue with calcification and aortic valve replacement using a rapid deployment bovine pericardial tissue valve on 12/02/2016.  He was last seen here in our office on December 11, 2017 at which time he was doing well.  He returns for office today and is accompanied by his son.  He reports that he is doing quite well and he claims to be more active than he had been in the past.  He denies problems with exertional shortness of breath.  He states that he was evaluated recently by his primary care physician and found to be anemic.  He has been referred to a hematologist.  Recent follow-up echocardiogram also demonstrated the presence of a paravalvular leak.  The patient specifically denies any fevers, chills, or productive cough.  He denies shortness of breath.  He states that he occasionally has some pains around his right side.  Both he and his son state that he has been doing reasonably well, although his son has lots of questions about his recent echo report.   Current Outpatient Medications  Medication Sig Dispense Refill  . amLODipine (NORVASC) 10 MG tablet Take 10 mg by mouth daily.  11  . amoxicillin (AMOXIL) 500 MG tablet Take 4 tablets (2 grams) by mouth 30-60 minutes prior to dental visit. 12 tablet 1    . aspirin EC 81 MG tablet Take 81 mg by mouth daily.    . Capsicum, Cayenne, (CAYENNE PEPPER PO) Take by mouth. Takes 1 tablet po as directed. This is a complex tablet along with ginko biloba    . carvedilol (COREG) 6.25 MG tablet TAKE 1 TABLET BY MOUTH TWICE A DAY 180 tablet 2  . cholecalciferol (VITAMIN D) 1000 units tablet Take 1,000 Units by mouth daily.    . Ginkgo Biloba 40 MG TABS Take by mouth. Unsure dose. Complex tablet along with cayenne pepper. Takes as directed.    . iron polysaccharides (NIFEREX) 150 MG capsule Take 1 capsule (150 mg total) by mouth daily.    Lia Hopping Leaf POWD by Does not apply route as directed.    . Tetrahydrozoline HCl (VISINE OP) Apply to eye as directed.    Marland Kitchen losartan (COZAAR) 100 MG tablet Take 1 tablet (100 mg total) by mouth daily. 90 tablet 3   No current facility-administered medications for this visit.       Physical Exam:   BP (!) 146/74   Pulse 66   Resp 20   Ht 5' 7.5" (1.715 m)   Wt 209 lb (94.8 kg)   SpO2 95% Comment: RA  BMI 32.25 kg/m   General:  Well-appearing  Chest:   Clear to auscultation  CV:   Regular rate and rhythm with grade 2/6 systolic murmur heard along the right sternal border.  No diastolic murmurs noted  Incisions:  n/a  Abdomen:  Soft nontender  Extremities:  Warm and well-perfused  Diagnostic Tests:  Echocardiography  Patient:    James Curtis, James Curtis MR #:       [id number] Study Date: 04/17/2018 Gender:     M Age:        49 Height:     171.5 cm Weight:     94.9 kg BSA:        2.16 m^2 Pt. Status: Room:   SONOGRAPHER  Reidland, Will  ATTENDING    Kathlen Mody, Scott T  Faylene Million, Scott T  REFERRING    Kathlen Mody, Scott T  PERFORMING   [facility]  cc:  ------------------------------------------------------------------- LV EF: 55% -   60%  ------------------------------------------------------------------- Indications:       (I50.42).  ------------------------------------------------------------------- History:   PMH:  S/p AVR (73mm Edwards Intuity rapid deployment bovine pericardial tissue valve). Acquired from the patient and from the patient&'s chart.  Congestive heart failure.  Risk factors:  Current tobacco use. Hypertension. Diabetes mellitus. Dyslipidemia.  ------------------------------------------------------------------- Study Conclusions  - Left ventricle: The cavity size was normal. There was severe   concentric hypertrophy. Systolic function was normal. The   estimated ejection fraction was in the range of 55% to 60%. Wall   motion was normal; there were no regional wall motion   abnormalities. Features are consistent with a pseudonormal left   ventricular filling pattern, with concomitant abnormal relaxation   and increased filling pressure (grade 2 diastolic dysfunction). - Ventricular septum: Septal motion showed abnormal function,   dyssynergy, and paradox. These changes are consistent with right   ventricular pacing. - Aortic valve: A bioprosthesis was present. There was   mild-moderate perivalvular regurgitation. - Mitral valve: Moderately to severely calcified annulus. - Left atrium: The atrium was severely dilated. - Atrial septum: No defect or patent foramen ovale was identified. - Tricuspid valve: There was mild-moderate regurgitation directed   centrally. - Pulmonary arteries: Systolic pressure was mildly increased. PA   peak pressure: 42 mm Hg (S).  Impressions:  - Compared to the June 2018 study:   - left ventricular systolic function is better; no ischemic   appearing wall motion abnormalities are seen, but there is   pacing-related apical dyssynchrony;   - aortic insufficiency is substantially worse and appears to be   perivalvular, at the medial aspect of the valve annulus;   - transvalvular gradients are not meaningfully changed;   - left ventricular filling  pressures are higher.  Recommendations:  Consider transesophageal echocardiography to evaluate periprosthetic aortic insufficiency, especially if there is concern for endocarditis.  ------------------------------------------------------------------- Labs, prior tests, procedures, and surgery: Permanent pacemaker system implantation.  ------------------------------------------------------------------- Study data:  Comparison was made to the study of 01/26/2017.  Study status:  Routine.  Procedure:  The patient reported no pain pre or post test. Transthoracic echocardiography. Image quality was adequate.  Study completion:  There were no complications. Echocardiography.  M-mode, complete 2D, spectral Doppler, and color Doppler.  Birthdate:  Patient birthdate: 1941-01-05.  Age:  Patient is 77 yr old.  Sex:  Gender: male.    BMI: 32.3 kg/m^2.  Blood pressure:     158/74  Patient status:  Outpatient.  Study date: Study date: 04/17/2018. Study time: 01:06 PM.  Location:  [facility]  -------------------------------------------------------------------  ------------------------------------------------------------------- Left ventricle:  The cavity size was normal. There was severe concentric hypertrophy. Systolic function was normal. The estimated ejection fraction was  in the range of 55% to 60%. Wall motion was normal; there were no regional wall motion abnormalities. Features are consistent with a pseudonormal left ventricular filling pattern, with concomitant abnormal relaxation and increased filling pressure (grade 2 diastolic dysfunction).  ------------------------------------------------------------------- Aortic valve:  A bioprosthesis was present.  Doppler:   There was mild-moderate perivalvular regurgitation.      VTI ratio of LVOT to aortic valve: 0.4. Valve area (VTI): 1.25 cm^2. Indexed valve area (VTI): 0.58 cm^2/m^2. Peak velocity ratio of LVOT to aortic  valve: 0.37. Valve area (Vmax): 1.17 cm^2. Indexed valve area (Vmax): 0.54 cm^2/m^2. Mean velocity ratio of LVOT to aortic valve: 0.42. Valve area (Vmean): 1.31 cm^2. Indexed valve area (Vmean): 0.61 cm^2/m^2.    Mean gradient (S): 18 mm Hg. Peak gradient (S): 35 mm Hg.  ------------------------------------------------------------------- Aorta:  Aortic root: The aortic root was normal in size. Ascending aorta: The ascending aorta was mildly dilated.  ------------------------------------------------------------------- Mitral valve:   Moderately to severely calcified annulus. Leaflet separation was normal.  Doppler:  Transvalvular velocity was within the normal range. There was no evidence for stenosis. There was no regurgitation.    Valve area by pressure half-time: 2.62 cm^2. Indexed valve area by pressure half-time: 1.21 cm^2/m^2.    Peak gradient (D): 8 mm Hg.  ------------------------------------------------------------------- Left atrium:  The atrium was severely dilated.  ------------------------------------------------------------------- Atrial septum:  No defect or patent foramen ovale was identified.   ------------------------------------------------------------------- Right ventricle:  The cavity size was normal. Pacer wire or catheter noted in right ventricle. Systolic function was normal.   ------------------------------------------------------------------- Ventricular septum:   Septal motion showed abnormal function, dyssynergy, and paradox. These changes are consistent with right ventricular pacing.  ------------------------------------------------------------------- Pulmonic valve:    Structurally normal valve.   Cusp separation was normal.  Doppler:  Transvalvular velocity was within the normal range. There was mild regurgitation.  ------------------------------------------------------------------- Tricuspid valve:   Structurally normal valve.   Leaflet  separation was normal.  Doppler:  Transvalvular velocity was within the normal range. There was mild-moderate regurgitation directed centrally.   ------------------------------------------------------------------- Pulmonary artery:   Systolic pressure was mildly increased.  ------------------------------------------------------------------- Right atrium:  The atrium was normal in size.  ------------------------------------------------------------------- Pericardium:  There was no pericardial effusion.  ------------------------------------------------------------------- Measurements   Left ventricle                           Value          Reference  LV ID, ED, PLAX chordal                  52    mm       43 - 52  LV ID, ES, PLAX chordal                  37    mm       23 - 38  LV fx shortening, PLAX chordal           29    %        >=29  LV PW thickness, ED                      19.58 mm       ----------  IVS/LV PW ratio, ED                      1.02           <=  1.3  Stroke volume, 2D                        81    ml       ----------  Stroke volume/bsa, 2D                    38    ml/m^2   ----------  LV e&', lateral                           5.72  cm/s     ----------  LV E/e&', lateral                         24.83          ----------  LV e&', medial                            4.13  cm/s     ----------  LV E/e&', medial                          34.38          ----------  LV e&', average                           4.93  cm/s     ----------  LV E/e&', average                         28.83          ----------    Ventricular septum                       Value          Reference  IVS thickness, ED                        19.95 mm       ----------    LVOT                                     Value          Reference  LVOT ID, S                               20    mm       ----------  LVOT area                                3.14  cm^2     ----------  LVOT ID                                   20    mm       ----------  LVOT peak velocity, S                    111   cm/s     ----------  LVOT mean velocity, S                    81.5  cm/s     ----------  LVOT VTI, S                              25.8  cm       ----------  Stroke volume (SV), LVOT DP              81.1  ml       ----------  Stroke index (SV/bsa), LVOT DP           37.6  ml/m^2   ----------    Aortic valve                             Value          Reference  Aortic valve peak velocity, S            297   cm/s     ----------  Aortic valve mean velocity, S            195   cm/s     ----------  Aortic valve VTI, S                      64.9  cm       ----------  Aortic mean gradient, S                  18    mm Hg    ----------  Aortic peak gradient, S                  35    mm Hg    ----------  VTI ratio, LVOT/AV                       0.4            ----------  Aortic valve area, VTI                   1.25  cm^2     ----------  Aortic valve area/bsa, VTI               0.58  cm^2/m^2 ----------  Velocity ratio, peak, LVOT/AV            0.37           ----------  Aortic valve area, peak velocity         1.17  cm^2     ----------  Aortic valve area/bsa, peak              0.54  cm^2/m^2 ----------  velocity  Velocity ratio, mean, LVOT/AV            0.42           ----------  Aortic valve area, mean velocity         1.31  cm^2     ----------  Aortic valve area/bsa, mean              0.61  cm^2/m^2 ----------  velocity  Aortic regurg pressure half-time         420   ms       ----------    Aorta  Value          Reference  Aortic root ID, ED                       39    mm       ----------  Ascending aorta ID, A-P, S               39    mm       ----------    Left atrium                              Value          Reference  LA ID, A-P, ES                           51    mm       ----------  LA ID/bsa, A-P                   (H)     2.36  cm/m^2   <=2.2  LA volume, ES, 1-p A4C                    63.5  ml       ----------  LA volume/bsa, ES, 1-p A4C               29.4  ml/m^2   ----------  LA volume, ES, 1-p A2C                   101   ml       ----------  LA volume/bsa, ES, 1-p A2C               46.8  ml/m^2   ----------    Mitral valve                             Value          Reference  Mitral E-wave peak velocity              142   cm/s     ----------  Mitral A-wave peak velocity              160   cm/s     ----------  Mitral deceleration time         (H)     288   ms       150 - 230  Mitral pressure half-time                84    ms       ----------  Mitral peak gradient, D                  8     mm Hg    ----------  Mitral E/A ratio, peak                   0.9            ----------  Mitral valve area, PHT, DP               2.62  cm^2     ----------  Mitral valve area/bsa, PHT, DP           1.21  cm^2/m^2 ----------  Pulmonary arteries                       Value          Reference  PA pressure, S, DP               (H)     42    mm Hg    <=30    Tricuspid valve                          Value          Reference  Tricuspid regurg peak velocity           312   cm/s     ----------  Tricuspid peak RV-RA gradient            39    mm Hg    ----------    Right atrium                             Value          Reference  RA ID, S-I, ES, A4C              (H)     67.2  mm       34 - 49  RA area, ES, A4C                 (H)     19.9  cm^2     8.3 - 19.5  RA volume, ES, A/L                       49.2  ml       ----------  RA volume/bsa, ES, A/L                   22.8  ml/m^2   ----------    Systemic veins                           Value          Reference  Estimated CVP                            3     mm Hg    ----------    Right ventricle                          Value          Reference  TAPSE                                    10.7  mm       ----------  RV pressure, S, DP               (H)     42    mm Hg    <=30  RV s&', lateral, S                        8.7   cm/s      ----------  Legend: (L)  and  (H)  mark values outside specified reference range.  -------------------------------------------------------------------  Prepared and Electronically Authenticated by  Sanda Klein, MD 2019-09-03T17:03:19    CT CHEST WITHOUT CONTRAST  TECHNIQUE: Multidetector CT imaging of the chest was performed following the standard protocol without IV contrast.  COMPARISON:  04/14/2017 and 11/26/2016.  FINDINGS: Cardiovascular: Atherosclerotic calcification of the arterial vasculature. Pulmonary arteries and heart are enlarged. No pericardial effusion.  Mediastinum/Nodes: Mediastinal lymph nodes measure up to 1.4 cm in the low right paratracheal station, as before. Hilar regions are difficult to evaluate without IV contrast. No axillary adenopathy. Esophagus is grossly unremarkable.  Lungs/Pleura: Centrilobular emphysema. Superimposed traction bronchiectasis/bronchiolectasis with subpleural reticulation and ground-glass. Additional ground-glass is seen along the parenchymal interstitium. Findings are grossly stable from 11/26/2016. 6 mm subpleural right upper lobe nodule (series 8, image 47), unchanged from 11/26/2016 and considered benign. Part solid nodule in the peripheral left lower lobe measures 1.6 x 2.0 cm with an internal solid component measuring 0.7 x 1.1 cm, previously 1.3 x 1.9 cm with an internal solid component measuring 0.7 x 1.2 cm on 11/26/2016. No new pulmonary nodules. No pleural fluid. Airway is unremarkable.  Upper Abdomen: Visualized portions of the liver, gallbladder, adrenal glands and right kidney are unremarkable. Low and high attenuation lesions in the left kidney measure up to 3.3 cm, incompletely imaged. Visualized portions of the spleen, pancreas, stomach and bowel are grossly unremarkable. No upper abdominal adenopathy.  Musculoskeletal: Sclerotic lesion in the T10 vertebral body is unchanged. Degenerative  changes in the spine. No worrisome lytic or sclerotic lesions.  IMPRESSION: 1. Sub solid left lower lobe nodule is stable to minimally larger than on baseline examination 11/26/2016. Findings remain worrisome for low-grade adenocarcinoma. 2. Subpleural right upper lobe 6 mm nodule, stable from baseline 11/26/2016 and considered benign. 3. Pulmonary fibrotic changes may be due to nonspecific interstitial pneumonitis. 4.  Emphysema (ICD10-J43.9). 5.  Aortic atherosclerosis (ICD10-170.0). 6. Enlarged pulmonary arteries, indicative of pulmonary arterial hypertension.   Electronically Signed   By: Lorin Picket M.D.   On: 06/11/2018 12:04    Impression:  I have personally reviewed the patient's recent transthoracic echocardiogram and follow-up CT scan of the chest.  Echocardiogram demonstrates normal left ventricular systolic function with a moderate perivalvular leak that was not seen on his most recent echocardiogram performed since his surgery.  At present the patient denies worsening symptoms of congestive heart failure.  He apparently has been diagnosed with anemia and has been referred to a hematologist.  The possibility of hemolysis should be considered.    The follow-up CT scan of the chest demonstrates multiple pulmonary nodules and diffuse chronic lung disease which appears essentially stable.  The interpreting radiologist has suggested there might be a slight increase in size in the nodule seen in the periphery of the left lower lobe.  Maximum diameter of this lesion is greater than 1 cm.  This could represent a very slow-growing malignancy such as bronchoalveolar cell carcinoma.  It is also possible this may remain benign.  This patient would not be considered a surgical candidate even if this lesion were biopsy-proven to be malignant.   Plan:  I have discussed the results of both the echocardiogram and the CT scan at length with the patient and his son in the office  today.  In the absence of worsening symptoms of congestive heart failure or severe hemolytic anemia, I would favor continued observation for the patient's paravalvular leak.  If the patient develops significant symptoms transesophageal echocardiogram and cardiac gated CT angiogram of the heart should be performed  with possible referral for an attempt at catheter-based plugging of his perivalvular leak.  With regards to the lung nodules we discussed continued observation with serial imaging versus PET/CT imaging.  I explained to the patient and his son that even if PET/CT imaging is negative cannot be 100% sure that the nodule in the left lung is not a slow-growing malignancy.  However, a negative PET/CT would be reassuring.  The patient will undergo a PET/CT scan and blood work including CBC and a haptoglobin level.  I have instructed him to keep his appointment with his hematologist.  We will request that recent records from blood work performed to his primary care physician's office are provided for review.  The patient will return to our office in approximately 2 weeks.  I spent in excess of 15 minutes during the conduct of this office consultation and >50% of this time involved direct face-to-face encounter with the patient for counseling and/or coordination of their care.    Valentina Gu. Roxy Manns, MD 06/20/2018 4:37 PM

## 2018-06-20 NOTE — Patient Instructions (Signed)
Continue all previous medications without any changes at this time  

## 2018-06-21 ENCOUNTER — Other Ambulatory Visit: Payer: Self-pay | Admitting: *Deleted

## 2018-06-21 DIAGNOSIS — R911 Solitary pulmonary nodule: Secondary | ICD-10-CM

## 2018-06-22 ENCOUNTER — Other Ambulatory Visit: Payer: Self-pay | Admitting: Internal Medicine

## 2018-06-22 LAB — CBC
HCT: 23.3 % — ABNORMAL LOW (ref 38.5–50.0)
Hemoglobin: 7.9 g/dL — ABNORMAL LOW (ref 13.2–17.1)
MCH: 30 pg (ref 27.0–33.0)
MCHC: 33.9 g/dL (ref 32.0–36.0)
MCV: 88.6 fL (ref 80.0–100.0)
MPV: 11.2 fL (ref 7.5–12.5)
PLATELETS: 220 10*3/uL (ref 140–400)
RBC: 2.63 10*6/uL — AB (ref 4.20–5.80)
RDW: 14 % (ref 11.0–15.0)
WBC: 4.9 10*3/uL (ref 3.8–10.8)

## 2018-06-22 LAB — HAPTOGLOBIN

## 2018-06-22 NOTE — Telephone Encounter (Signed)
Refill Request.  

## 2018-06-27 ENCOUNTER — Telehealth: Payer: Self-pay

## 2018-06-27 ENCOUNTER — Other Ambulatory Visit: Payer: Self-pay

## 2018-06-27 DIAGNOSIS — D509 Iron deficiency anemia, unspecified: Secondary | ICD-10-CM

## 2018-06-27 NOTE — Telephone Encounter (Signed)
LMOM to have lab checked at  [facility] sometime this week due to low hemoglobin/haptoglobin. Orders are in chart.

## 2018-06-28 DIAGNOSIS — D509 Iron deficiency anemia, unspecified: Secondary | ICD-10-CM | POA: Diagnosis not present

## 2018-06-28 LAB — CUP PACEART REMOTE DEVICE CHECK
Battery Remaining Longevity: 106 mo
Battery Voltage: 3 V
Brady Statistic AP VS Percent: 0 %
Brady Statistic RA Percent Paced: 60.34 %
Brady Statistic RV Percent Paced: 99.99 %
Date Time Interrogation Session: 20191009111514
Implantable Lead Implant Date: 20180409
Implantable Lead Location: 753859
Implantable Lead Location: 753860
Implantable Lead Model: 5076
Implantable Pulse Generator Implant Date: 20180409
Lead Channel Impedance Value: 266 Ohm
Lead Channel Impedance Value: 380 Ohm
Lead Channel Pacing Threshold Amplitude: 1.125 V
Lead Channel Pacing Threshold Pulse Width: 0.4 ms
Lead Channel Pacing Threshold Pulse Width: 0.4 ms
Lead Channel Sensing Intrinsic Amplitude: 1.75 mV
Lead Channel Sensing Intrinsic Amplitude: 1.75 mV
Lead Channel Sensing Intrinsic Amplitude: 16.75 mV
Lead Channel Setting Pacing Amplitude: 2.5 V
Lead Channel Setting Sensing Sensitivity: 2.8 mV
MDC IDC LEAD IMPLANT DT: 20180409
MDC IDC MSMT LEADCHNL RA IMPEDANCE VALUE: 399 Ohm
MDC IDC MSMT LEADCHNL RV IMPEDANCE VALUE: 323 Ohm
MDC IDC MSMT LEADCHNL RV PACING THRESHOLD AMPLITUDE: 0.625 V
MDC IDC MSMT LEADCHNL RV SENSING INTR AMPL: 15.375 mV
MDC IDC SET LEADCHNL RA PACING AMPLITUDE: 2.25 V
MDC IDC SET LEADCHNL RV PACING PULSEWIDTH: 0.4 ms
MDC IDC STAT BRADY AP VP PERCENT: 60.4 %
MDC IDC STAT BRADY AS VP PERCENT: 39.59 %
MDC IDC STAT BRADY AS VS PERCENT: 0 %

## 2018-06-29 ENCOUNTER — Ambulatory Visit (HOSPITAL_COMMUNITY)
Admission: RE | Admit: 2018-06-29 | Discharge: 2018-06-29 | Disposition: A | Discharge status: Home / Self Care | Payer: Medicare Other | Source: Ambulatory Visit | Attending: Thoracic Surgery (Cardiothoracic Vascular Surgery) | Admitting: Thoracic Surgery (Cardiothoracic Vascular Surgery)

## 2018-06-29 ENCOUNTER — Telehealth: Payer: Self-pay

## 2018-06-29 DIAGNOSIS — R911 Solitary pulmonary nodule: Secondary | ICD-10-CM | POA: Diagnosis not present

## 2018-06-29 LAB — HEMOGLOBIN AND HEMATOCRIT, BLOOD
HCT: 23.3 % — ABNORMAL LOW (ref 38.5–50.0)
HEMOGLOBIN: 7.9 g/dL — AB (ref 13.2–17.1)

## 2018-06-29 LAB — GLUCOSE, CAPILLARY: Glucose-Capillary: 117 mg/dL — ABNORMAL HIGH (ref 70–99)

## 2018-06-29 LAB — HAPTOGLOBIN: Haptoglobin: <8 mg/dL — ABNORMAL LOW (ref 43–212)

## 2018-06-29 MED ORDER — FLUDEOXYGLUCOSE F - 18 (FDG) INJECTION
9.7000 | Freq: Once | INTRAVENOUS | Status: AC | PRN
  Administered 2018-06-29: 9.7 via INTRAVENOUS

## 2018-06-29 NOTE — Telephone Encounter (Signed)
LMOM x 2 to have labs re-check today. Haptoglobin/Hemoglobin/Hematocrit. The order is in his chart. To be drawn at [facility].

## 2018-07-05 DIAGNOSIS — D649 Anemia, unspecified: Secondary | ICD-10-CM | POA: Diagnosis not present

## 2018-07-09 ENCOUNTER — Ambulatory Visit: Payer: Medicare Other | Admitting: Thoracic Surgery (Cardiothoracic Vascular Surgery)

## 2018-07-09 ENCOUNTER — Encounter (HOSPITAL_COMMUNITY)
Admission: RE | Admit: 2018-07-09 | Discharge: 2018-07-09 | Disposition: A | Discharge status: Home / Self Care | Payer: Self-pay | Source: Ambulatory Visit | Attending: Internal Medicine | Admitting: Internal Medicine

## 2018-07-10 ENCOUNTER — Encounter: Payer: Self-pay | Admitting: Thoracic Surgery (Cardiothoracic Vascular Surgery)

## 2018-07-10 ENCOUNTER — Telehealth: Payer: Self-pay | Admitting: Oncology

## 2018-07-10 NOTE — Telephone Encounter (Signed)
New referral received from Dr. [name] for anemia. I spoke to the pt's son to schedule an appt. James Curtis has been scheduled to see James Curtis on 12/3 at 1pm. Aware to arrive 30 minutes early.

## 2018-07-11 ENCOUNTER — Encounter (HOSPITAL_COMMUNITY): Payer: Self-pay

## 2018-07-16 ENCOUNTER — Encounter (HOSPITAL_COMMUNITY): Payer: Self-pay | Attending: Internal Medicine

## 2018-07-16 DIAGNOSIS — D151 Benign neoplasm of heart: Secondary | ICD-10-CM | POA: Insufficient documentation

## 2018-07-16 DIAGNOSIS — Z952 Presence of prosthetic heart valve: Secondary | ICD-10-CM | POA: Insufficient documentation

## 2018-07-17 ENCOUNTER — Inpatient Hospital Stay: Payer: Medicare Other | Admitting: Oncology

## 2018-07-17 ENCOUNTER — Telehealth: Payer: Self-pay | Admitting: Hematology and Oncology

## 2018-07-17 ENCOUNTER — Encounter: Payer: Self-pay | Admitting: Hematology and Oncology

## 2018-07-17 ENCOUNTER — Ambulatory Visit (INDEPENDENT_AMBULATORY_CARE_PROVIDER_SITE_OTHER): Payer: Medicare Other | Admitting: Thoracic Surgery (Cardiothoracic Vascular Surgery)

## 2018-07-17 ENCOUNTER — Inpatient Hospital Stay: Payer: Medicare Other | Attending: Oncology | Admitting: Hematology and Oncology

## 2018-07-17 ENCOUNTER — Encounter: Payer: Self-pay | Admitting: Thoracic Surgery (Cardiothoracic Vascular Surgery)

## 2018-07-17 VITALS — BP 184/84 | HR 76 | Resp 20 | Ht 67.5 in | Wt 209.0 lb

## 2018-07-17 VITALS — BP 174/79 | HR 72 | Temp 97.6°F | Resp 20 | Ht 67.5 in | Wt 212.4 lb

## 2018-07-17 DIAGNOSIS — T8203XD Leakage of heart valve prosthesis, subsequent encounter: Secondary | ICD-10-CM

## 2018-07-17 DIAGNOSIS — Z87891 Personal history of nicotine dependence: Secondary | ICD-10-CM | POA: Insufficient documentation

## 2018-07-17 DIAGNOSIS — Z952 Presence of prosthetic heart valve: Secondary | ICD-10-CM | POA: Diagnosis not present

## 2018-07-17 DIAGNOSIS — D649 Anemia, unspecified: Secondary | ICD-10-CM | POA: Insufficient documentation

## 2018-07-17 DIAGNOSIS — I350 Nonrheumatic aortic (valve) stenosis: Secondary | ICD-10-CM | POA: Insufficient documentation

## 2018-07-17 DIAGNOSIS — E1140 Type 2 diabetes mellitus with diabetic neuropathy, unspecified: Secondary | ICD-10-CM | POA: Insufficient documentation

## 2018-07-17 DIAGNOSIS — Z8673 Personal history of transient ischemic attack (TIA), and cerebral infarction without residual deficits: Secondary | ICD-10-CM | POA: Diagnosis not present

## 2018-07-17 DIAGNOSIS — R918 Other nonspecific abnormal finding of lung field: Secondary | ICD-10-CM

## 2018-07-17 DIAGNOSIS — Z8546 Personal history of malignant neoplasm of prostate: Secondary | ICD-10-CM | POA: Insufficient documentation

## 2018-07-17 DIAGNOSIS — C61 Malignant neoplasm of prostate: Secondary | ICD-10-CM

## 2018-07-17 DIAGNOSIS — E7800 Pure hypercholesterolemia, unspecified: Secondary | ICD-10-CM | POA: Insufficient documentation

## 2018-07-17 DIAGNOSIS — I2510 Atherosclerotic heart disease of native coronary artery without angina pectoris: Secondary | ICD-10-CM | POA: Diagnosis not present

## 2018-07-17 DIAGNOSIS — T8209XS Other mechanical complication of heart valve prosthesis, sequela: Secondary | ICD-10-CM

## 2018-07-17 DIAGNOSIS — D599 Acquired hemolytic anemia, unspecified: Secondary | ICD-10-CM | POA: Insufficient documentation

## 2018-07-17 DIAGNOSIS — R911 Solitary pulmonary nodule: Secondary | ICD-10-CM | POA: Insufficient documentation

## 2018-07-17 DIAGNOSIS — Z79899 Other long term (current) drug therapy: Secondary | ICD-10-CM | POA: Insufficient documentation

## 2018-07-17 DIAGNOSIS — D573 Sickle-cell trait: Secondary | ICD-10-CM | POA: Diagnosis not present

## 2018-07-17 DIAGNOSIS — D539 Nutritional anemia, unspecified: Secondary | ICD-10-CM

## 2018-07-17 DIAGNOSIS — Z950 Presence of cardiac pacemaker: Secondary | ICD-10-CM | POA: Diagnosis not present

## 2018-07-17 DIAGNOSIS — Z809 Family history of malignant neoplasm, unspecified: Secondary | ICD-10-CM

## 2018-07-17 DIAGNOSIS — I10 Essential (primary) hypertension: Secondary | ICD-10-CM | POA: Diagnosis not present

## 2018-07-17 NOTE — Patient Instructions (Signed)
Continue all previous medications without any changes at this time  

## 2018-07-17 NOTE — Progress Notes (Signed)
[address]       [place] [address]             [phone]     CARDIOTHORACIC SURGERY OFFICE NOTE  Referring Provider is Constance Haw, MD  Primary Cardiologist is End, [name], MD PCP is [name], MD   HPI:  Patient returns to the office today for follow-up of his incidental pulmonary nodules with slowly enlarging nodule in the left lower lobe and recently discovered paravalvular leak with hemolytic anemia.  He was seen recently in our office on June 20, 2018.  Blood work performed at that time confirmed the presence of severe anemia with very low haptoglobin level, consistent with hemolytic anemia.  Reticulocyte count was ordered but apparently never done.  PET CT scan was performed June 29, 2018 and the patient returns to the office today with his son for follow-up to discuss the results of his multiple tests.  He was reportedly seen by a hematologist earlier today for consultation and additional blood work was drawn at that time, results remain pending.  The patient states that over the past few weeks he has remained clinically stable.  He specifically denies symptoms of exertional shortness of breath, generalized weakness, or dizzy spells.  Overall the patient states that he feels quite well.   Current Outpatient Medications  Medication Sig Dispense Refill  . amLODipine (NORVASC) 10 MG tablet Take 10 mg by mouth daily.  11  . amoxicillin (AMOXIL) 500 MG tablet Take 4 tablets (2 grams) by mouth 30-60 minutes prior to dental visit. 12 tablet 1  . aspirin EC 81 MG tablet Take 81 mg by mouth daily.    . Capsicum, Cayenne, (CAYENNE PEPPER PO) Take by mouth. Takes 1 tablet po as directed. This is a complex tablet along with ginko biloba    . carvedilol (COREG) 6.25 MG tablet TAKE 1 TABLET BY MOUTH TWICE A DAY 180 tablet 2  . cholecalciferol (VITAMIN D) 1000 units tablet Take 1,000 Units by mouth daily.    . Ginkgo Biloba 40 MG TABS Take by  mouth. Unsure dose. Complex tablet along with cayenne pepper. Takes as directed.    . iron polysaccharides (NIFEREX) 150 MG capsule Take 1 capsule (150 mg total) by mouth daily.    Lia Hopping Leaf POWD by Does not apply route as directed.    . Tetrahydrozoline HCl (VISINE OP) Apply to eye as directed.    Marland Kitchen losartan (COZAAR) 100 MG tablet Take 1 tablet (100 mg total) by mouth daily. 90 tablet 3   No current facility-administered medications for this visit.       Physical Exam:   BP (!) 184/84   Pulse 76   Resp 20   Ht 5' 7.5" (1.715 m)   Wt 209 lb (94.8 kg)   SpO2 97% Comment: RA  BMI 32.25 kg/m   General:  Well-appearing  Chest:   Clear to auscultation  CV:   Regular rate and rhythm with systolic murmur, no diastolic murmur appreciated  Incisions:  n/a  Abdomen:  Soft nontender  Extremities:  Warm and well-perfused  Diagnostic Tests:  Results for James Curtis, James Curtis (MRN [id number]) as of 07/17/2018 17:59  Ref. Range 06/28/2018 15:37  Hemoglobin Latest Ref Range: 13.2 - 17.1 g/dL 7.9 (L)  HCT Latest Ref Range: 38.5 - 50.0 % 23.3 (L)  Haptoglobin Latest Ref Range: 43 - 212 mg/dL <8 (L)     NUCLEAR MEDICINE PET SKULL BASE TO  THIGH  TECHNIQUE: 9.7 mCi F-18 FDG was injected intravenously. Full-ring PET imaging was performed from the skull base to thigh after the radiotracer. CT data was obtained and used for attenuation correction and anatomic localization.  Fasting blood glucose: 117 mg/dl  COMPARISON:  Multiple exams, including 06/11/2018  FINDINGS: Mediastinal blood pool activity: SUV max 3.0  NECK: No significant abnormal hypermetabolic activity in this region.  Incidental CT findings: Atherosclerotic calcification of the internal carotid arteries.  CHEST: The left lower lobe sub solid nodule today measures 1.6 by 1.0 cm and has a maximum SUV of 2.4.  The chronic subpleural nodule along the right upper lobe is not hypermetabolic.  Patchy atelectasis in  the lower lobes. Subpleural reticulation with faintly accentuated metabolic activity. Distal esophageal activity with maximum SUV of 5.5, quite likely physiologic but technically nonspecific.  Lower paratracheal node 1.2 cm in short axis on image 64/4 (formerly 1.4 cm) with maximum SUV 2.9.  Incidental CT findings: Dual lead pacer. Moderate cardiomegaly. Coronary, aortic arch, and branch vessel atherosclerotic vascular disease. Bilateral gynecomastia. Prosthetic aortic valve. Calcified mitral valve.  ABDOMEN/PELVIS: Photopenic bilateral renal cysts of varying complexity.  Incidental CT findings: Aortoiliac atherosclerotic vascular disease. Sigmoid colon diverticulosis.  SKELETON: Capsular activity along the left iliofemoral ligament is thought to be physiologic/incidental. No significant abnormal osseous activity is identified.  Incidental CT findings: Bridging spurring of the right sacroiliac joint.  IMPRESSION: 1. The left lower lobe sub solid nodule has a maximum SUV of 2.4. This should be weighed against the background mediastinal blood pool activity of 3.0. Although this activity is low-grade, the enlargement of the nodule raises concern for low-grade adenocarcinoma. 2. Although the renal cysts are photopenic, 2 of the lesions have previously been characterized as Bosniak category 3. These should be followed is Bosniak category 3 cystic lesions rather than being assumed benign on the basis of today's PET-CT. 3. Mildly enlarged lower paratracheal node is just below blood pool activity, hence not considered hypermetabolic. 4. Other imaging findings of potential clinical significance: Aortic Atherosclerosis (ICD10-I70.0). Coronary atherosclerosis. Calcified mitral valve. Cardiomegaly. Sigmoid colon diverticulosis.   Electronically Signed   By: Van Clines M.D.   On: 06/29/2018 12:35   Impression:  Patient has likely hemolytic anemia related to  paravalvular leak identified on recent follow-up transthoracic echocardiogram.  At present the patient remains asymptomatic, although he is not very active physically.  The patient was seen in consultation earlier today by hematologist and additional blood work has been obtained, presumably to include a reticulocyte count and other blood work to further assess the patient's anemia.  Options include continued medical therapy as long as the patient is doing well clinically versus referral for possible transcatheter approach for plugging of his paravalvular leak versus redo aortic valve repair or replacement.  Because of the patient's advanced age, comorbid medical problems, and somewhat limited functional status I would only consider redo surgical intervention as a last resort.  I have personally reviewed the PET CT scan performed to further evaluate the slowly enlarging nodule in the left lower lobe.  The SUV of this nodule was measured 2.4 which is nondiagnostic but could also be consistent with a very slow growing malignancy, such as bronchoalveolar type adenocarcinoma.  Options include continued observation without any type of intervention versus CT-guided needle biopsy versus referral to the multidisciplinary thoracic oncology clinic and radiation oncology without any attempt to establish tissue diagnosis.  I would not consider this patient a candidate for surgical biopsy or lung  resection.  CT-guided needle biopsy might be relatively low yield, and a negative diagnosis would not guarantee that the patient does not have a slow-growing malignancy.  Conversely, it would probably be inappropriate to consider radiation therapy without some type of definitive tissue diagnosis.    Plan:  I discussed matters at length with the patient and his son in the office today.  They are both inclined to avoid any type of aggressive intervention for either problem at this time.  With regard to management of the patient's  paravalvular leak and hemolytic anemia, they prefer to follow-up with the hematologist once his blood work has been completed and to see how the patient does with medical therapy alone.  If the patient's anemia does not improve or at least remain stable on medical therapy we would proceed with transesophageal echocardiogram as a next step followed by possible referral to Dr. Albertine Patricia at [facility] in [place] to consider an attempt at catheter-based plugging of the paravalvular leak.  With regards to the left lung nodule both the patient and his son are inclined to sit tight without any attempt at biopsy at this time and they desire to return for follow-up CT scan in 6 months to see whether or not the nodule shows sign of further enlargement.  All of her questions have been addressed.  I spent in excess of 15 minutes during the conduct of this office consultation and >50% of this time involved direct face-to-face encounter with the patient for counseling and/or coordination of their care.    Valentina Gu. Roxy Manns, MD 07/17/2018 4:24 PM

## 2018-07-17 NOTE — Telephone Encounter (Signed)
Printed calendar and avs. °

## 2018-07-17 NOTE — Patient Instructions (Signed)
We discussed in detail the previous laboratory studies from Drs. Polite and Roxy Manns.  Laboratory studies will be for performed today to identify an underlying nutritional deficiency, hemolytic process, metabolic abnormality, thyroid dysfunction, enzyme deficiency, or iron deficiency.  Barring any unforeseen complications, your next scheduled doctor visit is on August 02, 2018  Please do not hesitate to call should any new or untoward problems arise.  Thank you!

## 2018-07-17 NOTE — Progress Notes (Signed)
Outpatient Hematology/Oncology Initial Consultation  Patient Name:  James Curtis  DOB: [birth year]   Date of Service: July 17, 2018  Referring Provider: [name], MD  [address] [place],  [address]   Consulting Physician: Henreitta Leber, MD Hematology/Oncology  Reason for Referral: In the setting of a newly identified aortic biosynthetic paravalvular leak, with evidence of hemolysis, he presents now for further diagnostic and therapeutic recommendations.  History Present Illness: James Curtis is a 77 year old resident of [place], originally from [place], whose past medical history is significant for non-insulin-requiring diabetes mellitus; mixed hypercholesterolemia; primary hypertension; likely vascular dementia following a stroke syndrome in 2018; aortic stenosis; elevated BMI; prostate cancer treated with radioactive prostate seeds 7 years earlier while residing in [place] (no records available); coronary artery disease; former tobacco dependence; complete heart block, status post pacemaker placement in 2018; incidental pulmonary nodules; chronic diabetic nephropathy, stage 3; degenerative joint disease involving his hands and lower back; and sickle cell trait.  His primary care physician is Dr. [name]. He is accompanied by his attentive son James Curtis.  He originally underwent resection of left atrial mass which revealed a benign dystrophic fibrotic tissue with calcification and aortic valve replacement using a rapid deployment bovine pericardial tissue valve on December 02, 2016 under the direction of Dr. Darylene Price, cardiothoracic surgery. Since his surgery he has done well. His overall energy level has improved.  He denies any dyspnea either at rest or on animal exertion.  He was found, however, to be anemic.  A follow-up echocardiogram demonstrated the presence of a paravalvular leak.  His most recent transthoracic echocardiogram from  April 17, 2018 suggests normal left ventricular systolic function with a moderate perivalvular leak that was not seen on his most recent echocardiogram since his surgery.  A follow-up CT scan of the chest from June 11, 2018 demonstrates multiple pulmonary nodules and diffuse chronic lung disease which appear essentially stable.  There might be a slight increase in the nodule seen in the periphery of the left lower lobe.  The maximum diameter of this lesion measures 1.0 cm.  A slow-growing malignancy has not been excluded.  An FDG PET/CT scan was performed on November 15.  Those results revealed a left lower lobe solid nodule with maximum SUV 2.4.  It was weighted against the background mediastinal blood pool with activity SUV 3.0.  Although the activity was low-grade, the enlargement of the nodule raises concerns for a low-grade adenocarcinoma.  2 lesions were seen on the kidney which were more consistent with benign cystic lesions. They were photopenic. There was a mildly enlarged lower paratracheal lymph node which was not hypermetabolic.  The therapeutic options at this time include continued observation of the paravalvular leak.  If he develops significant symptoms, a transesophageal echocardiogram and cardiac gated CT angiogram of the heart could be performed with possible catheter based plugging of his perivalvular leak.  On November 6, a complete blood count showed hemoglobin 7.9 hematocrit 23.3 MCV 88.6 MCH 30.0 RDW 14.0 WBC 4.9; platelets 220,000.  Haptoglobin was <8.  The haptoglobin was repeated on November 14, <8.  He has no prior history of any blood disorders or bleeding tendency.  There is a vague history of blood disorders in his immediate family.  The details are unknown.  Apparently in her 68s his mother died of a "blood disorder."  His sister died in 75s of a "blood disorder."  His alcohol intake now is infrequent. In the  distant past, he was a heavier drinker. He denies any appetite  or weight deficit. He has no seizure disorder. There is no headache, dizziness, lightheadedness, syncope, or near syncopal episodes.  Following his stroke syndrome in 2018 he has had residual profound memory difficulties.  Multiple MRIs of the brain without intravenous contrast dating back to December 2017 revealed moderate right and severe left mesial temporal atrophy.  There were changes of mild periventricular and subcortical chronic small vessel ischemic disease.  In April 2018 no acute intracranial abnormality was identified on MRA of the head without contrast.  There were stable mild chronic microvascular ischemic changes and stable small chronic lacunar infarcts within the left hemi-pons. No high-grade stenosis, large vessel occlusion, or aneurysm was identified.    He has no visual changes or hearing deficit.  He had prior bilateral cataract surgery. There is no cough, sore throat, orthopnea.  He denies dyspnea either at rest or on minimal exertion.  He has no pain or difficulty in swallowing.  No fever, shaking chills, sweats, or flulike symptoms are reported.  He has no heartburn or indigestion.  He denies nausea, vomiting, diarrhea, or constipation.  He denies melena or bright red blood per rectum.  No urinary frequency, urgency, hematuria, or dysuria reported.  He has no numbness or tingling in the fingers or toes.  He has no peripheral arterial or venous thromboembolic disease.  It is with this background he presents now for further diagnostic and therapeutic recommendations in the setting of anemia with a component of hemolysis, likely secondary to his paravalvular leak as outlined above.  Past Medical History:  Diagnosis Date  . Aortic stenosis    a. 11/20/2016 Echo: mod AS;  b. 11/23/2016 TEE restricted mobility of noncoronary cusp;  c. 11/2016 Cath: mean grad 64mHg w/ dual lumen catheter, AVA 2.0cm^2; mean gradient by pullback = 161mg, AVA 2.3cm^2.  . Cardiomyopathy ([facility]   Echo 6/18: EF  40-45, inf/inf-septal HK-AK, Gr 1 DD, severe LVH, MAC, severe LAE, mildly reduced RVSF  . Carotid arterial disease ([facility]   a. 11/2016 Carotid U/S: bilat 1-39% ICA stenosis.  . Chronic kidney disease   . Complete heart block ([facility]   a. 11/21/2016 s/p MDT AzSantina EvansR MRI SureScan (ser # [id number]).  . Dementia ([facility]   mild  . Family history of adverse reaction to anesthesia    Son [name] a difficult time waking up  . Heart murmur   . Hypercholesteremia   . Hypertension    benign  . Incidental pulmonary nodule, greater than or equal to 29m69m needs f/u scan 3 months 11/28/2016   14 x 8 mm (mean diameter of 11 mm) left lower lobe pulmonary nodule with some surrounding ground-glass attenuation (axial image 42 of series 6). 9 x 5 mm (mean diameter of 7 mm) subpleural nodule in the periphery of the right upper lobe (axial image 20 of series 6). A few patchy areas of peripheral predominant ground-glass attenuation and subpleural reticulation are noted, scattered randomly throu  . Left atrial mass   . 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 11/2009 TEE: EF 60-65%, restricted mobility of noncoronary AoV cusp, 16x4 mm mobile Ca2+ echodensity on the atrial basal surface of the posterior MV leaflet, mild MR, no LA/LAA thrombus.  . Non-obstructive CAD (coronary artery disease)    a. 11/2016 Cath: LM large, nl, LAD min irregs, LCX large, min irregs, OM3 40, RCA large, 39m79m.  Paravalvular leak (prosthetic valve)    Echo 9/19: Severe concentric LVH, EF 55-60, normal wall motion, grade 2 diastolic dysfunction, bioprosthetic AVR with mild to moderate perivalvular regurgitation, moderate to severely calcified mitral valve annulus, severe LAE, mild to moderate TR, PASP 42  . Pre-diabetes   . Prostate cancer [facility])    s/p radiation  . Renal mass of unknown nature - needs f/u MRI 11/28/2016   Multiple renal lesions are noted in the kidneys bilaterally. Although several of these lesions are  low-attenuation, compatible with cysts, other lesions are intermediate to high attenuation, potentially enhancing  . S/P aortic valve replacement with bioprosthetic valve 12/02/2016   23 mm Edwards Intuity rapid deployment bovine pericardial tissue valve  . S/P resection of left atrial mass 12/02/2016   Benign dystrophic fibrotic tissue with calcification attached to posterior mitral annulus by thin fibrotic stalk  . Stroke ([place])   . TIA (transient ischemic attack)    a. 11/2016 in setting of complete heart block.   Past Surgical History:  Procedure Laterality Date  . AORTIC VALVE REPLACEMENT N/A 12/02/2016   Procedure: AORTIC VALVE REPLACEMENT (AVR) using a 23 Edwards Intuity Elite Aortic Valve;  Surgeon: Rexene Alberts, MD;  Location: [place];  Service: Open Heart Surgery;  Laterality: N/A;  . EXCISION OF ATRIAL MYXOMA N/A 12/02/2016   Procedure: Resection of left atrial mass ;  Surgeon: Rexene Alberts, MD;  Location: [place];  Service: Open Heart Surgery;  Laterality: N/A;  . PACEMAKER IMPLANT N/A 11/21/2016   Procedure: Pacemaker Implant;  Surgeon: Will [name], MD;  Location: [place] CV LAB;  Service: Cardiovascular;  Laterality: N/A;  . RIGHT/LEFT HEART CATH AND CORONARY ANGIOGRAPHY N/A 11/25/2016   Procedure: Right/Left Heart Cath and Coronary Angiography;  Surgeon: Nelva Bush, MD;  Location: [place] CV LAB;  Service: Cardiovascular;  Laterality: N/A;  . TEE WITHOUT CARDIOVERSION N/A 11/23/2016   Procedure: TRANSESOPHAGEAL ECHOCARDIOGRAM (TEE);  Surgeon: Jerline Pain, MD;  Location: [place];  Service: Cardiovascular;  Laterality: N/A;  . TEE WITHOUT CARDIOVERSION N/A 12/02/2016   Procedure: TRANSESOPHAGEAL ECHOCARDIOGRAM (TEE);  Surgeon: Rexene Alberts, MD;  Location: [place];  Service: Open Heart Surgery;  Laterality: N/A;   Family History  Problem Relation Age of Onset  . Cancer Father   Mother: Deceased: 53s: Blood disorder Father: Deceased: Age 63 years:?   Cancer He has no biological brothers Sisters (1): Deceased: 6s: Blood disorder  Social History   Socioeconomic History  . Marital status: Widowed    Spouse name: Not on file  . Number of children: 2  . Years of education: 40  . Highest education level: Not on file  Occupational History    Comment: retired  Scientific laboratory technician  . Financial resource strain: Not on file  . Food insecurity:    Worry: Not on file    Inability: Not on file  . Transportation needs:    Medical: Not on file    Non-medical: Not on file  Tobacco Use  . Smoking status: Former Smoker    Packs/day: 0.25    Types: Cigarettes    Last attempt to quit: 11/16/2016    Years since quitting: 1.6  . Smokeless tobacco: Never Used  Substance and Sexual Activity  . Alcohol use: No  . Drug use: No  . Sexual activity: Not Currently  Lifestyle  . Physical activity:    Days per week: Not on file    Minutes per session: Not on file  .  Stress: Not on file  Relationships  . Social connections:    Talks on phone: Not on file    Gets together: Not on file    Attends religious service: Not on file    Active member of club or organization: Not on file    Attends meetings of clubs or organizations: Not on file    Relationship status: Not on file  . Intimate partner violence:    Fear of current or ex partner: Not on file    Emotionally abused: Not on file    Physically abused: Not on file    Forced sexual activity: Not on file  Other Topics Concern  . Not on file  Social History Narrative   Lives alone   Caffeine   Haleem Hanner is a widower. He worked previously in Event organiser He has 2 sons. It sons have HTN/HLD/iBMI He lives alone He began smoking the age of 73 years. He smoked a maximum 1/2 pack of cigarettes daily He stopped smoking in 2018 He has no significant use of pipe, cigars, chewing tobacco He has not used e-cigarettes or vaping. His alcohol intake is infrequent. He was a heavier drinker in the  distant past.  Transfusion History: No prior transfusion  Exposure History: He has no known exposure to toxic chemicals, radiation, or pesticides  Allergies:  He has no known medical allergies Is no food or seasonal allergies  Current Outpatient Medications on File Prior to Visit  Medication Sig  . amLODipine (NORVASC) 10 MG tablet Take 10 mg by mouth daily.  Marland Kitchen amoxicillin (AMOXIL) 500 MG tablet Take 4 tablets (2 grams) by mouth 30-60 minutes prior to dental visit.  Marland Kitchen aspirin EC 81 MG tablet Take 81 mg by mouth daily.  . Capsicum, Cayenne, (CAYENNE PEPPER PO) Take by mouth. Takes 1 tablet po as directed. This is a complex tablet along with ginko biloba  . carvedilol (COREG) 6.25 MG tablet TAKE 1 TABLET BY MOUTH TWICE A DAY  . cholecalciferol (VITAMIN D) 1000 units tablet Take 1,000 Units by mouth daily.  . Ginkgo Biloba 40 MG TABS Take by mouth. Unsure dose. Complex tablet along with cayenne pepper. Takes as directed.  . iron polysaccharides (NIFEREX) 150 MG capsule Take 1 capsule (150 mg total) by mouth daily.  Marland Kitchen losartan (COZAAR) 100 MG tablet Take 1 tablet (100 mg total) by mouth daily.  Lia Hopping Leaf POWD by Does not apply route as directed.  . Tetrahydrozoline HCl (VISINE OP) Apply to eye as directed.   No current facility-administered medications on file prior to visit.     Review of Systems: Constitutional: No fever, sweats, or shaking chills.  No appetite or weight deficit; elevated BMI. Skin: No rash, scaling, sores, lumps, or jaundice; pallor. HEENT: No visual changes or hearing deficit. Pulmonary: No unusual cough, sore throat, or orthopnea; COPD; indeterminate pulmonary nodules. Breasts: No complaints. Cardiovascular: Coronary artery disease; no angina or myocardial infarction.  Status post pacemaker placement for complete heart block; essential hypertension and dyslipidemia. Aortic paravalvular leak; status post replacement in 2018. Gastrointestinal: No indigestion,  dysphagia, abdominal pain, diarrhea, or constipation.  No change in bowel habits. Genitourinary: No urinary frequency, urgency, hematuria, or dysuria. Musculoskeletal: No new arthralgias or myalgias; arthralgias involving the hands and lower back; no joint swelling or instability. Hematologic: No bleeding tendency or easy bruisability; extravascular hemolysis. Endocrine: No intolerance to hot or cold; no thyroid disease; non-insulin-requiring diabetes mellitus. Vascular: No peripheral arterial or venous thromboembolic disease. Psychological: No  anxiety, depression, or mood changes; no mental health illnesses. Neurological: Prior stroke syndrome; profound memory deficit; no dizziness, lightheadedness, syncope, or near syncopal episodes; no numbness or tingling in the fingers or toes.  Physical Examination: Vital Signs: Body surface area is 2.14 meters squared.  Vitals:   07/17/18 1315  BP: (!) 174/79  Pulse: 72  Resp: 20  Temp: 97.6 F (36.4 C)  SpO2: 100%    Filed Weights   07/17/18 1315  Weight: 212 lb 6.4 oz (96.3 kg)   ECOG PERFORMANCE STATUS: 1 Constitutional:  James Curtis is a fully nourished and developed African-American albeit overweight.  He looks age appropriate.  He is friendly and cooperative without respiratory compromise at rest. Skin: No rashes, scaling, jaundice, or itching. Generalized dry skin; pallor. HEENT: Head is normocephalic and atraumatic.  Pupils are equal round and reactive to light and accommodation.  Sclerae are anicteric. Bilateral arcus senilis. Conjunctivae are pale.  No sinus tenderness nor oropharyngeal lesions.  Lips without cracking or peeling; tongue without mass, inflammation, or nodularity.  Mucous membranes are moist. Neck: Supple and symmetric.  No jugular venous distention or thyromegaly.  Trachea is midline. Lymphatics: No cervical or supraclavicular lymphadenopathy.  No epitrochlear, axillary, or inguinal lymphadenopathy is  appreciated. Respiratory/chest: Thorax is symmetrical.  Breath sounds are clear to auscultation and percussion.  Normal excursion and respiratory effort. Back: Symmetric without deformity or tenderness. Cardiovascular: Heart rate and rhythm are regular with IV/VI harsh holosystolic murmur over the entire precordium. Gastrointestinal: Abdomen is obese, soft, nontender; no organomegaly.  Bowel sounds are normoactive.  No masses are appreciated. Rectal examination: Not performed. Extremities: In the lower extremities, there is no asymmetric swelling, erythema, tenderness, or cord formation.  No clubbing, cyanosis, nor edema. Hematologic: No petechiae, hematomas, or ecchymoses. Psychological:  He is oriented to person, place, and time; normal affect; memory and cognitive deficit Neurological: There are no gross neurologic deficits. Mental status examination was not performed.  Laboratory Results: I have reviewed the data as listed: CBC Latest Ref Rng & Units 06/28/2018 06/20/2018 12/14/2016  WBC 3.8 - 10.8 Thousand/uL - 4.9 9.8  Hemoglobin 13.2 - 17.1 g/dL 7.9(L) 7.9(L) 8.8(A)  Hematocrit 38.5 - 50.0 % 23.3(L) 23.3(L) 27(A)  Platelets 140 - 400 Thousand/uL - 220 244    CMP Latest Ref Rng & Units 08/09/2017 05/16/2017 04/12/2017  Glucose 65 - 99 mg/dL - 161(H) 99  BUN 8 - 27 mg/dL - 21 20  Creatinine 0.61 - 1.24 mg/dL 1.64(H) 1.73(H) 1.62(H)  Sodium 134 - 144 mmol/L - 143 145(H)  Potassium 3.5 - 5.2 mmol/L - 4.4 4.2  Chloride 96 - 106 mmol/L - 105 107(H)  CO2 20 - 29 mmol/L - 27 21  Calcium 8.6 - 10.2 mg/dL - 9.4 9.1  Total Protein 6.5 - 8.1 g/dL - - -  Total Bilirubin 0.3 - 1.2 mg/dL - - -  Alkaline Phos 38 - 126 U/L - - -  AST 15 - 41 U/L - - -  ALT 17 - 63 U/L - - -   Diagnostic/Imaging Studies: June 29, 2018 NUCLEAR MEDICINE PET SKULL BASE TO THIGH  TECHNIQUE: 9.7 mCi F-18 FDG was injected intravenously. Full-ring PET imaging was performed from the skull base to thigh after  the radiotracer. CT data was obtained and used for attenuation correction and anatomic localization.  Fasting blood glucose: 117 mg/dl  COMPARISON:  Multiple exams, including 06/11/2018  FINDINGS: Mediastinal blood pool activity: SUV max 3.0  NECK: No significant abnormal hypermetabolic activity  in this region.  Incidental CT findings: Atherosclerotic calcification of the internal carotid arteries.  CHEST: The left lower lobe sub solid nodule today measures 1.6 by 1.0 cm and has a maximum SUV of 2.4.  The chronic subpleural nodule along the right upper lobe is not hypermetabolic.  Patchy atelectasis in the lower lobes. Subpleural reticulation with faintly accentuated metabolic activity. Distal esophageal activity with maximum SUV of 5.5, quite likely physiologic but technically nonspecific.  Lower paratracheal node 1.2 cm in short axis on image 64/4 (formerly 1.4 cm) with maximum SUV 2.9.  Incidental CT findings: Dual lead pacer. Moderate cardiomegaly. Coronary, aortic arch, and branch vessel atherosclerotic vascular disease. Bilateral gynecomastia. Prosthetic aortic valve. Calcified mitral valve.  ABDOMEN/PELVIS: Photopenic bilateral renal cysts of varying complexity.  Incidental CT findings: Aortoiliac atherosclerotic vascular disease. Sigmoid colon diverticulosis.  SKELETON: Capsular activity along the left iliofemoral ligament is thought to be physiologic/incidental. No significant abnormal osseous activity is identified.  Incidental CT findings: Bridging spurring of the right sacroiliac joint.  IMPRESSION: 1. The left lower lobe sub solid nodule has a maximum SUV of 2.4. This should be weighed against the background mediastinal blood pool activity of 3.0. Although this activity is low-grade, the enlargement of the nodule raises concern for low-grade adenocarcinoma. 2. Although the renal cysts are photopenic, 2 of the lesions have previously  been characterized as Bosniak category 3. These should be followed is Bosniak category 3 cystic lesions rather than being assumed benign on the basis of today's PET-CT. 3. Mildly enlarged lower paratracheal node is just below blood pool activity, hence not considered hypermetabolic. 4. Other imaging findings of potential clinical significance: Aortic Atherosclerosis (ICD10-I70.0). Coronary atherosclerosis. Calcified mitral valve. Cardiomegaly. Sigmoid colon diverticulosis.  Van Clines M.D. 06/29/2018 12:35  June 11, 2018 CT CHEST WITHOUT CONTRAST  TECHNIQUE: Multidetector CT imaging of the chest was performed following the standard protocol without IV contrast.  COMPARISON:  04/14/2017 and 11/26/2016.  FINDINGS: Cardiovascular: Atherosclerotic calcification of the arterial vasculature. Pulmonary arteries and heart are enlarged. No pericardial effusion.  Mediastinum/Nodes: Mediastinal lymph nodes measure up to 1.4 cm in the low right paratracheal station, as before. Hilar regions are difficult to evaluate without IV contrast. No axillary adenopathy. Esophagus is grossly unremarkable.  Lungs/Pleura: Centrilobular emphysema. Superimposed traction bronchiectasis/bronchiolectasis with subpleural reticulation and ground-glass. Additional ground-glass is seen along the parenchymal interstitium. Findings are grossly stable from 11/26/2016. 6 mm subpleural right upper lobe nodule (series 8, image 47), unchanged from 11/26/2016 and considered benign. Part solid nodule in the peripheral left lower lobe measures 1.6 x 2.0 cm with an internal solid component measuring 0.7 x 1.1 cm, previously 1.3 x 1.9 cm with an internal solid component measuring 0.7 x 1.2 cm on 11/26/2016. No new pulmonary nodules. No pleural fluid. Airway is unremarkable.  Upper Abdomen: Visualized portions of the liver, gallbladder, adrenal glands and right kidney are unremarkable. Low and  high attenuation lesions in the left kidney measure up to 3.3 cm, incompletely imaged. Visualized portions of the spleen, pancreas, stomach and bowel are grossly unremarkable. No upper abdominal adenopathy.  Musculoskeletal: Sclerotic lesion in the T10 vertebral body is unchanged. Degenerative changes in the spine. No worrisome lytic or sclerotic lesions.  IMPRESSION: 1. Sub solid left lower lobe nodule is stable to minimally larger than on baseline examination 11/26/2016. Findings remain worrisome for low-grade adenocarcinoma. 2. Subpleural right upper lobe 6 mm nodule, stable from baseline 11/26/2016 and considered benign. 3. Pulmonary fibrotic changes may be due to nonspecific interstitial  pneumonitis. 4.  Emphysema (ICD10-J43.9). 5.  Aortic atherosclerosis (ICD10-170.0). 6. Enlarged pulmonary arteries, indicative of pulmonary arterial hypertension.  Lorin Picket M.D. 06/11/2018 12:04  August 09, 2017 MRI ABDOMEN WITHOUT AND WITH CONTRAST  TECHNIQUE: Multiplanar multisequence MR imaging of the abdomen was performed both before and after the administration of intravenous contrast.  CONTRAST:  35m MULTIHANCE GADOBENATE DIMEGLUMINE 529 MG/ML IV SOLN  COMPARISON:  02/02/2017  FINDINGS: Lower chest: 12 mm left lower lobe pulmonary nodule (series 5/ image 7), grossly unchanged from prior MRI, better evaluated on CT.  Hepatobiliary: Liver is within normal limits.  Gallbladder is unremarkable. No intrahepatic or extrahepatic ductal dilatation.  Pancreas:  Within normal limits.  Spleen:  Within normal limits.  Adrenals/Urinary Tract:  Adrenal glands are within normal limits.  Multiple bilateral renal cysts of varying sizes and complexities, many of which are hemorrhagic, benign (Bosniak I-II).  As noted on the prior, there is a 2.2 cm complex/septated cystic lesion in the anterior right lower kidney with multiple enhancing septations (series  1303/image 63), previously 2.4 cm, grossly unchanged. Again noted is a 3.9 cm complex lateral left upper pole lesion with suspected faint enhancing septations (series 1303/image 47), previously 4.0 cm, grossly unchanged. These are both considered Bosniak III lesions.  No hydronephrosis.  Stomach/Bowel: Stomach is within normal limits.  Visualized bowel is unremarkable.  Vascular/Lymphatic:  No evidence abdominal aortic aneurysm.  No suspicious abdominal lymphadenopathy.  Other:  No abdominal ascites.  Musculoskeletal: No focal osseous lesions.  IMPRESSION: Two complex bilateral renal lesions with suspected enhancing septations, grossly unchanged from the prior, Bosniak III.  Additional bilateral renal cysts of varying sizes and complexities, many of which are hemorrhagic, benign (Bosniak I-II).  SJulian HyM.D. 08/09/2017 17:12  Summary/Assessment: Anemia suggestive of a hemolytic process, likely extravascular No evidence of splenomegaly by way of MRI of the abdomen and pelvis from December 2018 Likely mechanical trauma to the RBCs secondary to high velocity malfunctioning aortic valve No evidence of infectious etiology or systemic disease contributing to hemolysis No new medication or over-the-counter nutrient, vitamins, or herbs that suggest drug-induced hemolytic anemia  Other comorbid problems include: Non-insulin-requiring diabetes mellitus Aortic valve replacement; no paravalvular leak Hemolysis Carotid artery disease Cardiomyopathy Primary hypertension Mixed hypercholesterolemia Elevated BMI Vascular dementia following stroke syndrome Profound memory deficit Coronary artery disease Complete heart block; status post pacemaker placement Incidental pulmonary nodules (no biopsy) Degenerative joint disease involving the hands and lower back Sickle cell trait Former tobacco dependence Prostate cancer; status post radioactive seed  placement Complex septated renal lesions/hemorrhagic cysts  Recommendation/Plan: Laboratory studies were requested to distinguish hemolytic defect Repeat CBC/Diff; CMP Hemolytic parameters:  Reticulocyte count/indirect bilirubin/LDH/haptoglobin/DAT  Laboratory studies to exclude: Nutritional deficit Hemoglobinopathy Iron deficiency Autoimmune hemolytic anemia/warm agglutinin Microangiopathic hemolytic anemia G6PD deficiency/RBC defect Disseminated intravascular coagulopathy Hemoglobinuria Rhabdomyolysis Thyroid/Metabolic  Prostate cancer Stage unknown Treated with radioactive prostate seed implant, 7 years ago PSA   Incidental pulmonary nodules FDG PET/CT: Suspicious but inconclusive After cardiac intervention, if indicated, consider med-onc input  -Due to previous commitment, lab work was deferred until December 4. -Barring any unforeseen complications, his next scheduled doctor visit is on December 19. -He was advised that follow-up will be with another provider since I am leaving the practice -If after the laboratory studies are obtained, there is evidence of severe anemia with hemoglobin <7 g/dL, PRBC may be    necessary.  -Telephone communication as needed -He was advised to call u[place]oreain the interim should any new  or untoward problems arise.  The total time spent discussing the rationale and importance of identifying the cause of hemolysis, identifying any potential confounding contributing factors to his anemia, methodology for each, preliminary considerations with recommendations was 60 minutes.  At least 50% of that time was spent in discussion, counseling, and answering questions.  Ample time was allotted to answer all questions.  This note was dictated using voice activated technology/software.  Unfortunately, typographical errors are not uncommon, and transcription is subject to mistakes and regrettably misinterpretation.  If necessary, clarification of the above  information can be discussed with me at any time.  Thank you Drs. Polite and Roxy Manns for allowing my participation in the care of [employer]. I will keep you closely informed as the results of his preliminary laboratory data become available.  Please do not hesitate to call should any questions arise regarding this initial consultation and discussion.  FOLLOW UP: AS DIRECTED   cc:                   Darylene Price, MD                         [place], MD  Hematology/Oncology [place] [address] [place], [place] [address] Office: [phone] GPQD: [phone]

## 2018-07-18 ENCOUNTER — Encounter (HOSPITAL_COMMUNITY): Payer: Self-pay

## 2018-07-19 ENCOUNTER — Inpatient Hospital Stay: Payer: Medicare Other

## 2018-07-19 DIAGNOSIS — T8209XS Other mechanical complication of heart valve prosthesis, sequela: Secondary | ICD-10-CM

## 2018-07-19 DIAGNOSIS — I350 Nonrheumatic aortic (valve) stenosis: Secondary | ICD-10-CM | POA: Diagnosis not present

## 2018-07-19 DIAGNOSIS — D573 Sickle-cell trait: Secondary | ICD-10-CM | POA: Diagnosis not present

## 2018-07-19 DIAGNOSIS — E1140 Type 2 diabetes mellitus with diabetic neuropathy, unspecified: Secondary | ICD-10-CM | POA: Diagnosis not present

## 2018-07-19 DIAGNOSIS — I2510 Atherosclerotic heart disease of native coronary artery without angina pectoris: Secondary | ICD-10-CM | POA: Diagnosis not present

## 2018-07-19 DIAGNOSIS — R911 Solitary pulmonary nodule: Secondary | ICD-10-CM | POA: Diagnosis not present

## 2018-07-19 DIAGNOSIS — C61 Malignant neoplasm of prostate: Secondary | ICD-10-CM

## 2018-07-19 DIAGNOSIS — D649 Anemia, unspecified: Secondary | ICD-10-CM

## 2018-07-19 DIAGNOSIS — D539 Nutritional anemia, unspecified: Secondary | ICD-10-CM

## 2018-07-19 LAB — CBC WITH DIFFERENTIAL (CANCER CENTER ONLY)
Abs Immature Granulocytes: 0.01 10*3/uL (ref 0.00–0.07)
BASOS ABS: 0.1 10*3/uL (ref 0.0–0.1)
BASOS PCT: 1 %
EOS ABS: 0.1 10*3/uL (ref 0.0–0.5)
EOS PCT: 2 %
HEMATOCRIT: 24 % — AB (ref 39.0–52.0)
Hemoglobin: 7.5 g/dL — ABNORMAL LOW (ref 13.0–17.0)
Immature Granulocytes: 0 %
Lymphocytes Relative: 11 %
Lymphs Abs: 0.6 10*3/uL — ABNORMAL LOW (ref 0.7–4.0)
MCH: 30.1 pg (ref 26.0–34.0)
MCHC: 31.3 g/dL (ref 30.0–36.0)
MCV: 96.4 fL (ref 80.0–100.0)
Monocytes Absolute: 0.5 10*3/uL (ref 0.1–1.0)
Monocytes Relative: 9 %
NEUTROS PCT: 77 %
NRBC: 0 % (ref 0.0–0.2)
Neutro Abs: 4.5 10*3/uL (ref 1.7–7.7)
PLATELETS: 184 10*3/uL (ref 150–400)
RBC: 2.49 MIL/uL — AB (ref 4.22–5.81)
RDW: 16.3 % — AB (ref 11.5–15.5)
WBC: 5.7 10*3/uL (ref 4.0–10.5)

## 2018-07-19 LAB — URINALYSIS, COMPLETE (UACMP) WITH MICROSCOPIC
Bacteria, UA: NONE SEEN
Bilirubin Urine: NEGATIVE
Glucose, UA: NEGATIVE mg/dL
Ketones, ur: NEGATIVE mg/dL
Leukocytes, UA: NEGATIVE
Nitrite: NEGATIVE
PROTEIN: 100 mg/dL — AB
Specific Gravity, Urine: 1.014 (ref 1.005–1.030)
pH: 5 (ref 5.0–8.0)

## 2018-07-19 LAB — IRON AND TIBC
Iron: 54 ug/dL (ref 42–163)
Saturation Ratios: 21 % (ref 20–55)
TIBC: 256 ug/dL (ref 202–409)
UIBC: 202 ug/dL (ref 117–376)

## 2018-07-19 LAB — VITAMIN B12: Vitamin B-12: 245 pg/mL (ref 180–914)

## 2018-07-19 LAB — COMPREHENSIVE METABOLIC PANEL
ALBUMIN: 4.1 g/dL (ref 3.5–5.0)
ALT: 8 U/L (ref 0–44)
ANION GAP: 9 (ref 5–15)
AST: 49 U/L — ABNORMAL HIGH (ref 15–41)
Alkaline Phosphatase: 63 U/L (ref 38–126)
BILIRUBIN TOTAL: 1.4 mg/dL — AB (ref 0.3–1.2)
BUN: 19 mg/dL (ref 8–23)
CHLORIDE: 110 mmol/L (ref 98–111)
CO2: 22 mmol/L (ref 22–32)
Calcium: 8.9 mg/dL (ref 8.9–10.3)
Creatinine, Ser: 1.76 mg/dL — ABNORMAL HIGH (ref 0.61–1.24)
GFR calc Af Amer: 42 mL/min — ABNORMAL LOW (ref >60)
GFR calc non Af Amer: 36 mL/min — ABNORMAL LOW (ref >60)
GLUCOSE: 116 mg/dL — AB (ref 70–99)
POTASSIUM: 3.9 mmol/L (ref 3.5–5.1)
Sodium: 141 mmol/L (ref 135–145)
TOTAL PROTEIN: 6.9 g/dL (ref 6.5–8.1)

## 2018-07-19 LAB — CK TOTAL AND CKMB (NOT AT ARMC)
CK, MB: 3.6 ng/mL (ref 0.5–5.0)
Relative Index: 1.7 (ref 0.0–2.5)
Total CK: 210 U/L (ref 49–397)

## 2018-07-19 LAB — DIC (DISSEMINATED INTRAVASCULAR COAGULATION)PANEL
D-Dimer, Quant: 0.64 ug/mL-FEU — ABNORMAL HIGH (ref 0.00–0.50)
Fibrinogen: 362 mg/dL (ref 210–475)
INR: 1.16
Platelets: 193 10*3/uL (ref 150–400)
Prothrombin Time: 14.7 seconds (ref 11.4–15.2)
aPTT: 33 seconds (ref 24–36)

## 2018-07-19 LAB — RETICULOCYTES
Immature Retic Fract: 17.3 % — ABNORMAL HIGH (ref 2.3–15.9)
RBC.: 2.49 MIL/uL — ABNORMAL LOW (ref 4.22–5.81)
Retic Count, Absolute: 159.6 10*3/uL (ref 19.0–186.0)
Retic Ct Pct: 6.4 % — ABNORMAL HIGH (ref 0.4–3.1)

## 2018-07-19 LAB — TSH: TSH: 1.757 u[IU]/mL (ref 0.320–4.118)

## 2018-07-19 LAB — DIRECT ANTIGLOBULIN TEST (NOT AT ARMC)
DAT, IgG: NEGATIVE
DAT, complement: NEGATIVE

## 2018-07-19 LAB — LACTATE DEHYDROGENASE: LDH: 1766 U/L — ABNORMAL HIGH (ref 98–192)

## 2018-07-19 LAB — FOLATE: Folate: 7.9 ng/mL (ref >5.9)

## 2018-07-19 LAB — FERRITIN: Ferritin: 94 ng/mL (ref 24–336)

## 2018-07-19 LAB — SAVE SMEAR(SSMR), FOR PROVIDER SLIDE REVIEW

## 2018-07-20 ENCOUNTER — Inpatient Hospital Stay: Payer: Medicare Other

## 2018-07-20 ENCOUNTER — Encounter (HOSPITAL_COMMUNITY): Payer: Self-pay

## 2018-07-20 ENCOUNTER — Telehealth: Payer: Self-pay | Admitting: Hematology and Oncology

## 2018-07-20 ENCOUNTER — Other Ambulatory Visit: Payer: Self-pay

## 2018-07-20 DIAGNOSIS — D649 Anemia, unspecified: Secondary | ICD-10-CM

## 2018-07-20 DIAGNOSIS — I350 Nonrheumatic aortic (valve) stenosis: Secondary | ICD-10-CM | POA: Diagnosis not present

## 2018-07-20 DIAGNOSIS — R911 Solitary pulmonary nodule: Secondary | ICD-10-CM | POA: Diagnosis not present

## 2018-07-20 DIAGNOSIS — E1140 Type 2 diabetes mellitus with diabetic neuropathy, unspecified: Secondary | ICD-10-CM | POA: Diagnosis not present

## 2018-07-20 DIAGNOSIS — I2510 Atherosclerotic heart disease of native coronary artery without angina pectoris: Secondary | ICD-10-CM | POA: Diagnosis not present

## 2018-07-20 DIAGNOSIS — D573 Sickle-cell trait: Secondary | ICD-10-CM | POA: Diagnosis not present

## 2018-07-20 LAB — GLUCOSE 6 PHOSPHATE DEHYDROGENASE
G6PDH: 13.4 U/g{Hb} (ref 4.6–13.5)
Hemoglobin: 7.5 g/dL — ABNORMAL LOW (ref 13.0–17.7)

## 2018-07-20 LAB — PROSTATE-SPECIFIC AG, SERUM (LABCORP): Prostate Specific Ag, Serum: <0.1 ng/mL (ref 0.0–4.0)

## 2018-07-20 LAB — HAPTOGLOBIN: Haptoglobin: <10 mg/dL — ABNORMAL LOW (ref 34–200)

## 2018-07-20 LAB — PATHOLOGIST SMEAR REVIEW

## 2018-07-20 LAB — ABO/RH: ABO/RH(D): B POS

## 2018-07-20 LAB — PREPARE RBC (CROSSMATCH)

## 2018-07-20 NOTE — Telephone Encounter (Signed)
Scheduled appt per 12/6 sch message - pt is aware of appt date and time

## 2018-07-21 ENCOUNTER — Inpatient Hospital Stay: Payer: Medicare Other

## 2018-07-21 DIAGNOSIS — D649 Anemia, unspecified: Secondary | ICD-10-CM | POA: Diagnosis not present

## 2018-07-21 DIAGNOSIS — D573 Sickle-cell trait: Secondary | ICD-10-CM | POA: Diagnosis not present

## 2018-07-21 DIAGNOSIS — I2510 Atherosclerotic heart disease of native coronary artery without angina pectoris: Secondary | ICD-10-CM | POA: Diagnosis not present

## 2018-07-21 DIAGNOSIS — I350 Nonrheumatic aortic (valve) stenosis: Secondary | ICD-10-CM | POA: Diagnosis not present

## 2018-07-21 DIAGNOSIS — E1140 Type 2 diabetes mellitus with diabetic neuropathy, unspecified: Secondary | ICD-10-CM | POA: Diagnosis not present

## 2018-07-21 DIAGNOSIS — R911 Solitary pulmonary nodule: Secondary | ICD-10-CM | POA: Diagnosis not present

## 2018-07-21 MED ORDER — SODIUM CHLORIDE 0.9% IV SOLUTION
250.0000 mL | Freq: Once | INTRAVENOUS | Status: DC
  Filled 2018-07-21: qty 250

## 2018-07-21 NOTE — Patient Instructions (Signed)
Blood Transfusion, Adult, Care After This sheet gives you information about how to care for yourself after your procedure. Your health care provider may also give you more specific instructions. If you have problems or questions, contact your health care provider. What can I expect after the procedure? After your procedure, it is common to have:  Bruising and soreness where the IV tube was inserted.  Headache.  Follow these instructions at home:  Take over-the-counter and prescription medicines only as told by your health care provider.  Return to your normal activities as told by your health care provider.  Follow instructions from your health care provider about how to take care of your IV insertion site. Make sure you: ? Wash your hands with soap and water before you change your bandage (dressing). If soap and water are not available, use hand sanitizer. ? Change your dressing as told by your health care provider.  Check your IV insertion site every day for signs of infection. Check for: ? More redness, swelling, or pain. ? More fluid or blood. ? Warmth. ? Pus or a bad smell. Contact a health care provider if:  You have more redness, swelling, or pain around the IV insertion site.  You have more fluid or blood coming from the IV insertion site.  Your IV insertion site feels warm to the touch.  You have pus or a bad smell coming from the IV insertion site.  Your urine turns pink, red, or brown.  You feel weak after doing your normal activities. Get help right away if:  You have signs of a serious allergic or immune system reaction, including: ? Itchiness. ? Hives. ? Trouble breathing. ? Anxiety. ? Chest or lower back pain. ? Fever, flushing, and chills. ? Rapid pulse. ? Rash. ? Diarrhea. ? Vomiting. ? Dark urine. ? Serious headache. ? Dizziness. ? Stiff neck. ? Yellow coloration of the face or the white parts of the eyes (jaundice). This information is not  intended to replace advice given to you by your health care provider. Make sure you discuss any questions you have with your health care provider. Document Released: 08/22/2014 Document Revised: 03/30/2016 Document Reviewed: 02/15/2016 Elsevier Interactive Patient Education  2018 Elsevier Inc.  

## 2018-07-22 LAB — TYPE AND SCREEN
ABO/RH(D): B POS
Antibody Screen: NEGATIVE
Unit division: 0

## 2018-07-22 LAB — BPAM RBC
BLOOD PRODUCT EXPIRATION DATE: 201912252359
ISSUE DATE / TIME: 201912070917
UNIT TYPE AND RH: 7300

## 2018-07-23 ENCOUNTER — Ambulatory Visit: Payer: Medicare Other | Admitting: Thoracic Surgery (Cardiothoracic Vascular Surgery)

## 2018-07-23 ENCOUNTER — Encounter (HOSPITAL_COMMUNITY): Payer: Self-pay

## 2018-07-23 ENCOUNTER — Other Ambulatory Visit: Payer: Self-pay | Admitting: *Deleted

## 2018-07-23 MED ORDER — FOLIC ACID 1 MG PO TABS: 1.0000 mg | ORAL_TABLET | Freq: Every day | ORAL | 1 refills | Status: DC

## 2018-07-23 NOTE — Progress Notes (Signed)
Patient's son called regarding medication MD wanted his patient to take. Spoke with Audelia Hives, MD stated patient should be taking Folic Acid 1 mg daily. Medication e-scribed to pharmacy. Patient's son verbalized understanding.

## 2018-07-24 LAB — HEMOGLOBINOPATHY EVALUATION
HGB C: 0 %
HGB F QUANT: 0 % (ref 0.0–2.0)
Hgb A2 Quant: 3.7 % — ABNORMAL HIGH (ref 1.8–3.2)
Hgb A: 59.8 % — ABNORMAL LOW (ref 96.4–98.8)
Hgb S Quant: 36.5 % — ABNORMAL HIGH
Hgb Variant: 0 %

## 2018-07-25 ENCOUNTER — Encounter (HOSPITAL_COMMUNITY): Payer: Self-pay

## 2018-07-27 ENCOUNTER — Encounter (HOSPITAL_COMMUNITY): Payer: Self-pay

## 2018-07-30 ENCOUNTER — Encounter (HOSPITAL_COMMUNITY): Payer: Self-pay

## 2018-07-30 ENCOUNTER — Telehealth: Payer: Self-pay | Admitting: Internal Medicine

## 2018-07-30 NOTE — Telephone Encounter (Signed)
Former  RR patient. Moved 12/31 appointment with YZ to 12/26 w/[facility]H. Left message. Schedule mailed.

## 2018-08-01 ENCOUNTER — Encounter (HOSPITAL_COMMUNITY): Payer: Self-pay

## 2018-08-02 ENCOUNTER — Ambulatory Visit: Payer: Medicare Other | Admitting: Hematology and Oncology

## 2018-08-03 ENCOUNTER — Encounter (HOSPITAL_COMMUNITY): Payer: Self-pay

## 2018-08-06 ENCOUNTER — Encounter (HOSPITAL_COMMUNITY): Payer: Self-pay

## 2018-08-09 ENCOUNTER — Telehealth: Payer: Self-pay | Admitting: *Deleted

## 2018-08-09 ENCOUNTER — Telehealth: Payer: Self-pay

## 2018-08-09 ENCOUNTER — Inpatient Hospital Stay: Payer: Medicare Other

## 2018-08-09 ENCOUNTER — Inpatient Hospital Stay (HOSPITAL_BASED_OUTPATIENT_CLINIC_OR_DEPARTMENT_OTHER): Payer: Medicare Other | Admitting: Internal Medicine

## 2018-08-09 VITALS — BP 183/79 | HR 78 | Temp 97.9°F | Resp 16 | Ht 67.5 in | Wt 211.1 lb

## 2018-08-09 DIAGNOSIS — I350 Nonrheumatic aortic (valve) stenosis: Secondary | ICD-10-CM

## 2018-08-09 DIAGNOSIS — Z87891 Personal history of nicotine dependence: Secondary | ICD-10-CM

## 2018-08-09 DIAGNOSIS — D573 Sickle-cell trait: Secondary | ICD-10-CM

## 2018-08-09 DIAGNOSIS — J984 Other disorders of lung: Secondary | ICD-10-CM | POA: Diagnosis not present

## 2018-08-09 DIAGNOSIS — D649 Anemia, unspecified: Secondary | ICD-10-CM

## 2018-08-09 DIAGNOSIS — I10 Essential (primary) hypertension: Secondary | ICD-10-CM

## 2018-08-09 DIAGNOSIS — R911 Solitary pulmonary nodule: Secondary | ICD-10-CM | POA: Diagnosis not present

## 2018-08-09 DIAGNOSIS — D598 Other acquired hemolytic anemias: Secondary | ICD-10-CM

## 2018-08-09 DIAGNOSIS — N289 Disorder of kidney and ureter, unspecified: Secondary | ICD-10-CM

## 2018-08-09 DIAGNOSIS — I2510 Atherosclerotic heart disease of native coronary artery without angina pectoris: Secondary | ICD-10-CM | POA: Diagnosis not present

## 2018-08-09 DIAGNOSIS — E1140 Type 2 diabetes mellitus with diabetic neuropathy, unspecified: Secondary | ICD-10-CM | POA: Diagnosis not present

## 2018-08-09 LAB — CBC WITH DIFFERENTIAL/PLATELET
Abs Immature Granulocytes: 0.01 10*3/uL (ref 0.00–0.07)
Basophils Absolute: 0 10*3/uL (ref 0.0–0.1)
Basophils Relative: 1 %
Eosinophils Absolute: 0.1 10*3/uL (ref 0.0–0.5)
Eosinophils Relative: 2 %
HCT: 23 % — ABNORMAL LOW (ref 39.0–52.0)
Hemoglobin: 7.5 g/dL — ABNORMAL LOW (ref 13.0–17.0)
Immature Granulocytes: 0 %
Lymphocytes Relative: 14 %
Lymphs Abs: 0.7 10*3/uL (ref 0.7–4.0)
MCH: 30.9 pg (ref 26.0–34.0)
MCHC: 32.6 g/dL (ref 30.0–36.0)
MCV: 94.7 fL (ref 80.0–100.0)
Monocytes Absolute: 0.5 10*3/uL (ref 0.1–1.0)
Monocytes Relative: 10 %
Neutro Abs: 3.7 10*3/uL (ref 1.7–7.7)
Neutrophils Relative %: 73 %
Platelets: 171 10*3/uL (ref 150–400)
RBC: 2.43 MIL/uL — ABNORMAL LOW (ref 4.22–5.81)
RDW: 16.1 % — ABNORMAL HIGH (ref 11.5–15.5)
WBC: 5 10*3/uL (ref 4.0–10.5)
nRBC: 0 % (ref 0.0–0.2)

## 2018-08-09 LAB — COMPREHENSIVE METABOLIC PANEL
ALT: 12 U/L (ref 0–44)
AST: 54 U/L — ABNORMAL HIGH (ref 15–41)
Albumin: 4.2 g/dL (ref 3.5–5.0)
Alkaline Phosphatase: 66 U/L (ref 38–126)
Anion gap: 8 (ref 5–15)
BUN: 15 mg/dL (ref 8–23)
CO2: 26 mmol/L (ref 22–32)
Calcium: 9.2 mg/dL (ref 8.9–10.3)
Chloride: 109 mmol/L (ref 98–111)
Creatinine, Ser: 1.72 mg/dL — ABNORMAL HIGH (ref 0.61–1.24)
GFR calc Af Amer: 43 mL/min — ABNORMAL LOW (ref >60)
GFR, EST NON AFRICAN AMERICAN: 37 mL/min — AB (ref >60)
Glucose, Bld: 107 mg/dL — ABNORMAL HIGH (ref 70–99)
Potassium: 3.8 mmol/L (ref 3.5–5.1)
Sodium: 143 mmol/L (ref 135–145)
Total Bilirubin: 1.8 mg/dL — ABNORMAL HIGH (ref 0.3–1.2)
Total Protein: 7.1 g/dL (ref 6.5–8.1)

## 2018-08-09 LAB — LACTATE DEHYDROGENASE: LDH: 1677 U/L — ABNORMAL HIGH (ref 98–192)

## 2018-08-09 NOTE — Telephone Encounter (Signed)
Printed avs and calender of upcoming appointment. 

## 2018-08-09 NOTE — Progress Notes (Signed)
Diagnosis Anemia, unspecified type  Normocytic anemia - Plan: CBC with Differential/Platelet, Comprehensive metabolic panel, Lactate dehydrogenase, Protein electrophoresis, serum, Ferritin, Haptoglobin, CBC with Differential/Platelet, Comprehensive metabolic panel, Lactate dehydrogenase, Ferritin  Staging Cancer Staging No matching staging information was found for the patient.  Assessment and Plan:  1.  Hemolytic anemia.  Pt was previously followed by Dr. Audelia Hives.  He felt Anemia suggestive of a hemolytic process, likely extravascular.  Pt had labs done 07/19/2018 that showed WBC 5.7 HB 7.5 plts 184,000.  Chemistries WNL with K+ 3.9 elevated AST of 49.  LDH was elevated at 1766.  Ferritin was 94.  Coagulation studies WNL.  Hemoglobin electrophoresis consistent with sickle cell trait.   Haptoglobin was less than 8.  Pt was transfused on 07/20/2018.  Peripheral smear consistent with hemolysis.    Labs done today 08/09/2018 reviewed and showed WBC 5 HB 7.5 PLTS 171,000, LDH 1677.  Chemistries WNL with K+ 3.8 Cr 1.72 Bilirubin 1.8 and elevated AST of 54.  No HSM on MRI done 07/2017.    Pt had CT chest without contrast done 06/11/2018 that was reviewed and showed  IMPRESSION: 1. Sub solid left lower lobe nodule is stable to minimally larger than on baseline examination 11/26/2016. Findings remain worrisome for low-grade adenocarcinoma. 2. Subpleural right upper lobe 6 mm nodule, stable from baseline 11/26/2016 and considered benign. 3. Pulmonary fibrotic changes may be due to nonspecific interstitial pneumonitis. 4.  Emphysema (ICD10-J43.9). 5.  Aortic atherosclerosis (ICD10-170.0). 6. Enlarged pulmonary arteries, indicative of pulmonary arterial Hypertension.  PET scan done 06/29/2018 reviewed and showed 1. The left lower lobe sub solid nodule has a maximum SUV of 2.4. This should be weighed against the background mediastinal blood pool activity of 3.0. Although this activity is low-grade,  the enlargement of the nodule raises concern for low-grade adenocarcinoma. 2. Although the renal cysts are photopenic, 2 of the lesions have previously been characterized as Bosniak category 3. These should be followed is Bosniak category 3 cystic lesions rather than being assumed benign on the basis of today's PET-CT. 3. Mildly enlarged lower paratracheal node is just below blood pool activity, hence not considered hypermetabolic. 4. Other imaging findings of potential clinical significance: Aortic Atherosclerosis (ICD10-I70.0). Coronary atherosclerosis. Calcified mitral valve. Cardiomegaly. Sigmoid colon diverticulosis.  HB has not improved since recent transfusion.  Will set up transfusion of 1 U PRBCs due to HB 7.5.  Will send flow cytometry for PNH.  Will begin Prednisone dosed at 1 mg/KG total dose of 50 mg po bid, folic acid 1 mg po daily to determine if Hemolysis improves with steroids.  Review of record indicate pt has history of long standing anemia even dating back to 2018.    Differential diagnosis would include mechanical heart valve/AS, PNH, malignancy.    Pt will be referred to CT surgery for evaluation of lung lesion that was noted on scan done 06/29/2018.  He will be seen for follow-up in 1-2 weeks with repeat labs.  Son is power of attorney and can be reached at [phone].    2.  Lung lesion.  This was noted on PET scan done 06/29/2018.  Pt is referred to CT surgery for evaluation.    3.  Kidney lesion.  Pt had MRI of abdomen done 08/09/2017 that showed  IMPRESSION: Two complex bilateral renal lesions with suspected enhancing septations, grossly unchanged from the prior, Bosniak III.  Additional bilateral renal cysts of varying sizes and complexities, many of which are hemorrhagic, benign (Bosniak I-II).  Pet scan done 06/29/2018 showed bilateral renal cysts.    4.  Aortic stenosis/AVR.  Hemolytic process felt by Dr. Audelia Hives to be extravascular due to valve.  Pt  should continue to follow-up with cardiology as directed.    5.  RI.  Cr 1.76.  LDH > 1000.  Hemolysis on smear.  PLT count WNL at 171,000 on labs done today.    6  Sickle cell trait.  Usually an asymptomatic condition.    7.  HTN.  BP is 183/77.  Follow-up with PCP or cardiology as directed.    25 minutes spent with more than 50% spent in counseling and coordination of care.    Interval History:  77 yr old male previously followed by Dr. Audelia Hives for anemia felt to be extravascular due to heart valve.    Current Status:  Pt seen today for follow-up to go over labs.  Son is present at visit and provides much of information.    Problem List Patient Active Problem List   Diagnosis Date Noted  . Paravalvular leak (prosthetic valve) [T82.03XD]   . Hemolytic anemia ([place]) [D58.9]   . 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 Valvular heart disease [I38] 08/25/2017  . Hyperlipidemia [E78.5] 03/07/2017  . Former smoker [B14.782] 03/07/2017  . Chronic combined systolic and diastolic CHF (congestive heart failure) ([place]) [I50.42] 02/11/2017  . NICM (nonischemic cardiomyopathy) ([place]) [I42.8]   . S/P resection of left atrial mass [I51.89] 12/02/2016  . S/P AVR (aortic valve replacement) [Z95.2] 12/02/2016  . Type II diabetes mellitus ([place]) [E11.9]   . Chronic kidney disease, stage III (moderate) (HCC) [N18.3]   . Incidental pulmonary nodule, greater than or equal to 48m - needs f/u scan 3 months [R91.1] 11/28/2016  . Renal mass of unknown nature - needs f/u MRI [N28.89] 11/28/2016  . Coronary artery disease involving native coronary artery of native heart without angina pectoris [I25.10] 11/26/2016  . Mitral valve mass [I05.8]   . Dementia ([place] [F03.90]   . TIA (transient ischemic attack) [G45.9]   . Cardiac pacemaker in situ [Z95.0]   . Essential hypertension [I10]   . Complete heart block ([place] [I44.2] 11/18/2016    Past Medical History Past Medical History:  Diagnosis  Date  . Aortic stenosis    a. 11/20/2016 Echo: mod AS;  b. 11/23/2016 TEE restricted mobility of noncoronary cusp;  c. 11/2016 Cath: mean grad 255mg w/ dual lumen catheter, AVA 2.0cm^2; mean gradient by pullback = 1657m, AVA 2.3cm^2.  . Cardiomyopathy ([place]  Echo 6/18: EF 40-45, inf/inf-septal HK-AK, Gr 1 DD, severe LVH, MAC, severe LAE, mildly reduced RVSF  . Carotid arterial disease ([place]  a. 11/2016 Carotid U/S: bilat 1-39% ICA stenosis.  . Chronic kidney disease   . Complete heart block ([place]  a. 11/21/2016 s/p MDT AzuSantina Evans MRI SureScan (ser # [id number].  . Dementia ([place]  mild  . Family history of adverse reaction to anesthesia    Son MicLegrand Comod a difficult time waking up  . Heart murmur   . Hemolytic anemia (HCC[place] . Hypercholesteremia   . Hypertension    benign  . Incidental pulmonary nodule, greater than or equal to 8mm36mneeds f/u scan 3 months 11/28/2016   14 x 8 mm (mean diameter of 11 mm) left lower lobe pulmonary nodule with some surrounding ground-glass attenuation (axial image 42 of series 6). 9 x 5 mm (mean diameter of 7 mm)  subpleural nodule in the periphery of the right upper lobe (axial image 20 of series 6). A few patchy areas of peripheral predominant ground-glass attenuation and subpleural reticulation are noted, scattered randomly throu  . Left atrial mass   . 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 11/2009 TEE: EF 60-65%, restricted mobility of noncoronary AoV cusp, 16x4 mm mobile Ca2+ echodensity on the atrial basal surface of the posterior MV leaflet, mild MR, no LA/LAA thrombus.  . Non-obstructive CAD (coronary artery disease)    a. 11/2016 Cath: LM large, nl, LAD min irregs, LCX large, min irregs, OM3 40, RCA large, 38m   . Paravalvular leak (prosthetic valve)    Echo 9/19: Severe concentric LVH, EF 55-60, normal wall motion, grade 2 diastolic dysfunction, bioprosthetic AVR with mild to moderate perivalvular regurgitation, moderate to severely  calcified mitral valve annulus, severe LAE, mild to moderate TR, PASP 42  . Paravalvular leak (prosthetic valve), subsequent encounter   . Pre-diabetes   . Prostate cancer ([facility]    s/p radiation  . Renal mass of unknown nature - needs f/u MRI 11/28/2016   Multiple renal lesions are noted in the kidneys bilaterally. Although several of these lesions are low-attenuation, compatible with cysts, other lesions are intermediate to high attenuation, potentially enhancing  . S/P aortic valve replacement with bioprosthetic valve 12/02/2016   23 mm Edwards Intuity rapid deployment bovine pericardial tissue valve  . S/P resection of left atrial mass 12/02/2016   Benign dystrophic fibrotic tissue with calcification attached to posterior mitral annulus by thin fibrotic stalk  . Stroke ([place]   . TIA (transient ischemic attack)    a. 11/2016 in setting of complete heart block.    Past Surgical History Past Surgical History:  Procedure Laterality Date  . AORTIC VALVE REPLACEMENT N/A 12/02/2016   Procedure: AORTIC VALVE REPLACEMENT (AVR) using a 23 Edwards Intuity Elite Aortic Valve;  Surgeon: CRexene Alberts MD;  [place] [facility]  Service: Open Heart Surgery;  Laterality: N/A;  . EXCISION OF ATRIAL MYXOMA N/A 12/02/2016   Procedure: Resection of left atrial mass ;  Surgeon: CRexene Alberts MD;  [place]  Service: Open Heart Surgery;  Laterality: N/A;  . PACEMAKER IMPLANT N/A 11/21/2016   Procedure: Pacemaker Implant;  Surgeon: Will MMeredith Leeds MD;  [facility];  Service: Cardiovascular;  Laterality: N/A;  . RIGHT/LEFT HEART CATH AND CORONARY ANGIOGRAPHY N/A 11/25/2016   Procedure: Right/Left Heart Cath and Coronary Angiography;  Surgeon: CNelva Bush MD;  [facility];  Service: Cardiovascular;  Laterality: N/A;  . TEE WITHOUT CARDIOVERSION N/A 11/23/2016   Procedure: TRANSESOPHAGEAL ECHOCARDIOGRAM (TEE);  Surgeon: MJerline Pain MD;  [place] [facility]   Service: Cardiovascular;  Laterality: N/A;  . TEE WITHOUT CARDIOVERSION N/A 12/02/2016   Procedure: TRANSESOPHAGEAL ECHOCARDIOGRAM (TEE);  Surgeon: CRexene Alberts MD;  [place] [facility]  Service: Open Heart Surgery;  Laterality: N/A;    Family History Family History  Problem Relation Age of Onset  . Cancer Father      Social History  reports that he quit smoking about 20 months ago. His smoking use included cigarettes. He smoked 0.25 packs per day. He has never used smokeless tobacco. He reports that he does not drink alcohol or use drugs.  Medications  Current Outpatient Medications:  .  amLODipine (NORVASC) 10 MG tablet, Take 10 mg by mouth daily., Disp: , Rfl: 11 .  aspirin EC 81 MG tablet, Take  81 mg by mouth daily., Disp: , Rfl:  .  Capsicum, Cayenne, (CAYENNE PEPPER PO), Take by mouth. Takes 1 tablet po as directed. This is a complex tablet along with ginko biloba, Disp: , Rfl:  .  carvedilol (COREG) 6.25 MG tablet, TAKE 1 TABLET BY MOUTH TWICE A DAY, Disp: 180 tablet, Rfl: 2 .  cholecalciferol (VITAMIN D) 1000 units tablet, Take 1,000 Units by mouth daily., Disp: , Rfl:  .  folic acid (FOLVITE) 1 MG tablet, Take 1 tablet (1 mg total) by mouth daily., Disp: 30 tablet, Rfl: 1 .  Ginkgo Biloba 40 MG TABS, Take by mouth. Unsure dose. Complex tablet along with cayenne pepper. Takes as directed., Disp: , Rfl:  .  iron polysaccharides (NIFEREX) 150 MG capsule, Take 1 capsule (150 mg total) by mouth daily., Disp: , Rfl:  .  Sage Leaf POWD, by Does not apply route as directed., Disp: , Rfl:  .  Tetrahydrozoline HCl (VISINE OP), Apply to eye as directed., Disp: , Rfl:  .  amoxicillin (AMOXIL) 500 MG tablet, Take 4 tablets (2 grams) by mouth 30-60 minutes prior to dental visit. (Patient not taking: Reported on 08/09/2018), Disp: 12 tablet, Rfl: 1 .  losartan (COZAAR) 100 MG tablet, Take 1 tablet (100 mg total) by mouth daily., Disp: 90 tablet, Rfl: 3  Allergies Patient has no known  allergies.  Review of Systems Review of Systems - Oncology ROS negative   Physical Exam  Vitals Wt Readings from Last 3 Encounters:  08/09/18 211 lb 1.6 oz (95.8 kg)  07/17/18 209 lb (94.8 kg)  07/17/18 212 lb 6.4 oz (96.3 kg)   Temp Readings from Last 3 Encounters:  08/09/18 97.9 F (36.6 C) (Oral)  07/21/18 97.6 F (36.4 C) (Oral)  07/17/18 97.6 F (36.4 C) (Oral)   BP Readings from Last 3 Encounters:  08/09/18 (!) 183/79  07/21/18 (!) 159/65  07/17/18 (!) 184/84   Pulse Readings from Last 3 Encounters:  08/09/18 78  07/21/18 60  07/17/18 76   Constitutional: Well-developed, well-nourished, and in no distress.   HENT: Head: Normocephalic and atraumatic.  Mouth/Throat: No oropharyngeal exudate. Mucosa moist. Eyes: Pupils are equal, round, and reactive to light. Conjunctivae are normal. No scleral icterus.  Neck: Normal range of motion. Neck supple. No JVD present.  Cardiovascular: Normal rate, regular rhythm and normal heart sounds.  Exam reveals no gallop and no friction rub.   No murmur heard. Pulmonary/Chest: Effort normal and breath sounds normal. No respiratory distress. No wheezes.No rales.  Abdominal: Soft. Bowel sounds are normal. No distension. There is no tenderness. There is no guarding.  Musculoskeletal: No edema or tenderness.  Lymphadenopathy: No cervical, axillary or supraclavicular adenopathy.  Neurological: Alert and oriented to person, place, and time. No cranial nerve deficit.  Skin: Skin is warm and dry. No rash noted. No erythema. No pallor.  Psychiatric: Affect and judgment normal.   Labs Appointment on 08/09/2018  Component Date Value Ref Range Status  . LDH 08/09/2018 1,677* 98 - 192 U/L Final   Performed at [facility], [place] [address], [place], [place] [address]  . Sodium 08/09/2018 143  135 - 145 mmol/L Final  . Potassium 08/09/2018 3.8  3.5 - 5.1 mmol/L Final  . Chloride 08/09/2018 109  98 - 111 mmol/L Final   . CO2 08/09/2018 26  22 - 32 mmol/L Final  . Glucose, Bld 08/09/2018 107* 70 - 99 mg/dL Final  . BUN 08/09/2018 15  8 -  23 mg/dL Final  . Creatinine, Ser 08/09/2018 1.72* 0.61 - 1.24 mg/dL Final  . Calcium 08/09/2018 9.2  8.9 - 10.3 mg/dL Final  . Total Protein 08/09/2018 7.1  6.5 - 8.1 g/dL Final  . Albumin 08/09/2018 4.2  3.5 - 5.0 g/dL Final  . AST 08/09/2018 54* 15 - 41 U/L Final  . ALT 08/09/2018 12  0 - 44 U/L Final  . Alkaline Phosphatase 08/09/2018 66  38 - 126 U/L Final  . Total Bilirubin 08/09/2018 1.8* 0.3 - 1.2 mg/dL Final  . GFR calc non Af Amer 08/09/2018 37* >60 mL/min Final  . GFR calc Af Amer 08/09/2018 43* >60 mL/min Final  . Anion gap 08/09/2018 8  5 - 15 Final   Performed at [facility], [place] [address], [place], [place] [address]  . WBC 08/09/2018 5.0  4.0 - 10.5 K/uL Final  . RBC 08/09/2018 2.43* 4.22 - 5.81 MIL/uL Final  . Hemoglobin 08/09/2018 7.5* 13.0 - 17.0 g/dL Final  . HCT 08/09/2018 23.0* 39.0 - 52.0 % Final  . MCV 08/09/2018 94.7  80.0 - 100.0 fL Final  . MCH 08/09/2018 30.9  26.0 - 34.0 pg Final  . MCHC 08/09/2018 32.6  30.0 - 36.0 g/dL Final  . RDW 08/09/2018 16.1* 11.5 - 15.5 % Final  . Platelets 08/09/2018 171  150 - 400 K/uL Final  . nRBC 08/09/2018 0.0  0.0 - 0.2 % Final  . Neutrophils Relative % 08/09/2018 73  % Final  . Neutro Abs 08/09/2018 3.7  1.7 - 7.7 K/uL Final  . Lymphocytes Relative 08/09/2018 14  % Final  . Lymphs Abs 08/09/2018 0.7  0.7 - 4.0 K/uL Final  . Monocytes Relative 08/09/2018 10  % Final  . Monocytes Absolute 08/09/2018 0.5  0.1 - 1.0 K/uL Final  . Eosinophils Relative 08/09/2018 2  % Final  . Eosinophils Absolute 08/09/2018 0.1  0.0 - 0.5 K/uL Final  . Basophils Relative 08/09/2018 1  % Final  . Basophils Absolute 08/09/2018 0.0  0.0 - 0.1 K/uL Final  . Immature Granulocytes 08/09/2018 0  % Final  . Abs Immature Granulocytes 08/09/2018 0.01  0.00 - 0.07 K/uL Final   Performed at [facility], [place] [address], [place], [place] [address]     Pathology Orders Placed This Encounter  Procedures  . CBC with Differential/Platelet    Standing Status:   Future    Number of Occurrences:   1    Standing Expiration Date:   08/10/2019  . Comprehensive metabolic panel    Standing Status:   Future    Number of Occurrences:   1    Standing Expiration Date:   08/10/2019  . Lactate dehydrogenase    Standing Status:   Future    Number of Occurrences:   1    Standing Expiration Date:   08/10/2019  . Protein electrophoresis, serum    Standing Status:   Future    Number of Occurrences:   1    Standing Expiration Date:   08/10/2019  . Ferritin    Standing Status:   Future    Number of Occurrences:   1    Standing Expiration Date:   08/10/2019  . Haptoglobin    Standing Status:   Future    Number of Occurrences:   1    Standing Expiration Date:   08/10/2019  . CBC with Differential/Platelet    Standing Status:   Future  Standing Expiration Date:   08/09/2020  . Comprehensive metabolic panel    Standing Status:   Future    Standing Expiration Date:   08/09/2020  . Lactate dehydrogenase    Standing Status:   Future    Standing Expiration Date:   08/09/2020  . Ferritin    Standing Status:   Future    Standing Expiration Date:   08/09/2020       Zoila Shutter MD

## 2018-08-09 NOTE — Telephone Encounter (Signed)
Per Dr Walden Field patient needs 2 units of blood.    Attempted to call patient and/son to schedule Type and screen appt ideally for tomorrow 08/10/2018 and then infusion 5 hrs (2 units) for Saturday 830am (approved per Threasa Beards R.).    Left Message for returned call to schedule these appts.  No appts scheduled at this time and that was left on voicemail message to patient.    No orders as of yet, because we dont have dates confirmed for type and screen and transfusion.  Will need to notify Dr Walden Field to add once patient has called back.

## 2018-08-10 ENCOUNTER — Encounter (HOSPITAL_COMMUNITY): Payer: Self-pay

## 2018-08-10 ENCOUNTER — Telehealth: Payer: Self-pay | Admitting: *Deleted

## 2018-08-10 ENCOUNTER — Inpatient Hospital Stay: Payer: Medicare Other

## 2018-08-10 ENCOUNTER — Other Ambulatory Visit: Payer: Self-pay

## 2018-08-10 ENCOUNTER — Telehealth: Payer: Self-pay

## 2018-08-10 ENCOUNTER — Other Ambulatory Visit (HOSPITAL_COMMUNITY): Payer: Self-pay | Admitting: Internal Medicine

## 2018-08-10 DIAGNOSIS — D649 Anemia, unspecified: Secondary | ICD-10-CM

## 2018-08-10 DIAGNOSIS — D598 Other acquired hemolytic anemias: Secondary | ICD-10-CM

## 2018-08-10 DIAGNOSIS — D573 Sickle-cell trait: Secondary | ICD-10-CM | POA: Diagnosis not present

## 2018-08-10 DIAGNOSIS — R911 Solitary pulmonary nodule: Secondary | ICD-10-CM | POA: Diagnosis not present

## 2018-08-10 DIAGNOSIS — I2510 Atherosclerotic heart disease of native coronary artery without angina pectoris: Secondary | ICD-10-CM | POA: Diagnosis not present

## 2018-08-10 DIAGNOSIS — E1140 Type 2 diabetes mellitus with diabetic neuropathy, unspecified: Secondary | ICD-10-CM | POA: Diagnosis not present

## 2018-08-10 DIAGNOSIS — I350 Nonrheumatic aortic (valve) stenosis: Secondary | ICD-10-CM | POA: Diagnosis not present

## 2018-08-10 LAB — PROTEIN ELECTROPHORESIS, SERUM
A/G Ratio: 1.9 — ABNORMAL HIGH (ref 0.7–1.7)
Albumin ELP: 4.1 g/dL (ref 2.9–4.4)
Alpha-1-Globulin: 0.2 g/dL (ref 0.0–0.4)
Alpha-2-Globulin: 0.4 g/dL (ref 0.4–1.0)
Beta Globulin: 0.8 g/dL (ref 0.7–1.3)
GLOBULIN, TOTAL: 2.2 g/dL (ref 2.2–3.9)
Gamma Globulin: 0.8 g/dL (ref 0.4–1.8)
TOTAL PROTEIN ELP: 6.3 g/dL (ref 6.0–8.5)

## 2018-08-10 LAB — HAPTOGLOBIN: Haptoglobin: <10 mg/dL — ABNORMAL LOW (ref 34–355)

## 2018-08-10 LAB — FERRITIN: Ferritin: 129 ng/mL (ref 24–336)

## 2018-08-10 MED ORDER — PREDNISONE 50 MG PO TABS: ORAL_TABLET | ORAL | 0 refills | Status: DC

## 2018-08-10 MED ORDER — FOLIC ACID 1 MG PO TABS: 1.0000 mg | ORAL_TABLET | Freq: Every day | ORAL | 1 refills | Status: DC

## 2018-08-10 MED ORDER — DIPHENHYDRAMINE HCL 25 MG PO CAPS: 25.0000 mg | ORAL_CAPSULE | Freq: Once | ORAL | Status: DC

## 2018-08-10 NOTE — Telephone Encounter (Signed)
Attempted TC to patient's son, [name] to arrange for pt to get lab work done today (type and cross) and  blood transfusion tomorrow, 08/11/18.  No answer but was able to leave urgent message on identified phone # for [name] to call back ASAP.  No other  #  is available.  [name] is pt's 57  Will have orders entered for the above when we are able to get hold of the son.

## 2018-08-10 NOTE — Telephone Encounter (Signed)
TCT patient's son, [name] to remind him that there are 2 prescriptions for the patient @ [facility] on [address] to help treat pt's anemia. No answer to call but was able to leave a voicemail on identified phone #.

## 2018-08-10 NOTE — Telephone Encounter (Signed)
Per Dr. Walden Field, patient will only be getting one unit of blood on Saturday 12/28.

## 2018-08-10 NOTE — Telephone Encounter (Signed)
Patients son called this morning at 1000.  He stated that he needed to call us back due to a lab result.  I looked up his chart and saw the note from Rhae Hammock stating his need for two units of blood.  I then looked up his lab results and his Hgo was in fact 7.5.  I asked him when a good time would be for them to come in today for a type and screen.  He stated that 11am would be good.  I then sent a scheduling message and had that added.  I then informed the patient that the transfusion appointment would be Saturday at 0830.  He verbalized understanding.  I then put in an order for 2 units of blood for Saturday 12/28.

## 2018-08-11 ENCOUNTER — Inpatient Hospital Stay: Payer: Medicare Other

## 2018-08-11 DIAGNOSIS — I2510 Atherosclerotic heart disease of native coronary artery without angina pectoris: Secondary | ICD-10-CM | POA: Diagnosis not present

## 2018-08-11 DIAGNOSIS — D649 Anemia, unspecified: Secondary | ICD-10-CM

## 2018-08-11 DIAGNOSIS — R911 Solitary pulmonary nodule: Secondary | ICD-10-CM | POA: Diagnosis not present

## 2018-08-11 DIAGNOSIS — I350 Nonrheumatic aortic (valve) stenosis: Secondary | ICD-10-CM | POA: Diagnosis not present

## 2018-08-11 DIAGNOSIS — E1140 Type 2 diabetes mellitus with diabetic neuropathy, unspecified: Secondary | ICD-10-CM | POA: Diagnosis not present

## 2018-08-11 DIAGNOSIS — D573 Sickle-cell trait: Secondary | ICD-10-CM | POA: Diagnosis not present

## 2018-08-11 LAB — PREPARE RBC (CROSSMATCH)

## 2018-08-11 MED ORDER — DIPHENHYDRAMINE HCL 25 MG PO CAPS
ORAL_CAPSULE | ORAL | Status: AC
  Filled 2018-08-11: qty 1

## 2018-08-11 MED ORDER — ACETAMINOPHEN 325 MG PO TABS
ORAL_TABLET | ORAL | Status: AC
  Filled 2018-08-11: qty 2

## 2018-08-11 MED ORDER — DIPHENHYDRAMINE HCL 25 MG PO TABS: 25.0000 mg | ORAL_TABLET | Freq: Once | ORAL | Status: DC

## 2018-08-11 MED ORDER — ACETAMINOPHEN 325 MG PO TABS: 650.0000 mg | ORAL_TABLET | Freq: Once | ORAL | Status: DC

## 2018-08-11 NOTE — Patient Instructions (Signed)
Blood Transfusion, Adult, Care After This sheet gives you information about how to care for yourself after your procedure. Your health care provider may also give you more specific instructions. If you have problems or questions, contact your health care provider. What can I expect after the procedure? After your procedure, it is common to have:  Bruising and soreness where the IV tube was inserted.  Headache.  Follow these instructions at home:  Take over-the-counter and prescription medicines only as told by your health care provider.  Return to your normal activities as told by your health care provider.  Follow instructions from your health care provider about how to take care of your IV insertion site. Make sure you: ? Wash your hands with soap and water before you change your bandage (dressing). If soap and water are not available, use hand sanitizer. ? Change your dressing as told by your health care provider.  Check your IV insertion site every day for signs of infection. Check for: ? More redness, swelling, or pain. ? More fluid or blood. ? Warmth. ? Pus or a bad smell. Contact a health care provider if:  You have more redness, swelling, or pain around the IV insertion site.  You have more fluid or blood coming from the IV insertion site.  Your IV insertion site feels warm to the touch.  You have pus or a bad smell coming from the IV insertion site.  Your urine turns pink, red, or brown.  You feel weak after doing your normal activities. Get help right away if:  You have signs of a serious allergic or immune system reaction, including: ? Itchiness. ? Hives. ? Trouble breathing. ? Anxiety. ? Chest or lower back pain. ? Fever, flushing, and chills. ? Rapid pulse. ? Rash. ? Diarrhea. ? Vomiting. ? Dark urine. ? Serious headache. ? Dizziness. ? Stiff neck. ? Yellow coloration of the face or the white parts of the eyes (jaundice). This information is not  intended to replace advice given to you by your health care provider. Make sure you discuss any questions you have with your health care provider. Document Released: 08/22/2014 Document Revised: 03/30/2016 Document Reviewed: 02/15/2016 Elsevier Interactive Patient Education  2018 Elsevier Inc.  

## 2018-08-13 ENCOUNTER — Encounter (HOSPITAL_COMMUNITY): Payer: Self-pay

## 2018-08-13 ENCOUNTER — Telehealth: Payer: Self-pay

## 2018-08-13 DIAGNOSIS — I10 Essential (primary) hypertension: Secondary | ICD-10-CM | POA: Diagnosis not present

## 2018-08-13 DIAGNOSIS — T8203XS Leakage of heart valve prosthesis, sequela: Secondary | ICD-10-CM | POA: Diagnosis not present

## 2018-08-13 DIAGNOSIS — D598 Other acquired hemolytic anemias: Secondary | ICD-10-CM | POA: Diagnosis not present

## 2018-08-13 LAB — TYPE AND SCREEN
ABO/RH(D): B POS
Antibody Screen: NEGATIVE
Unit division: 0
Unit division: 0

## 2018-08-13 LAB — BPAM RBC
Blood Product Expiration Date: 202001202359
Blood Product Expiration Date: 202001212359
ISSUE DATE / TIME: 201912280915
ISSUE DATE / TIME: 201912280915
Unit Type and Rh: 7300
Unit Type and Rh: 7300

## 2018-08-13 NOTE — Telephone Encounter (Signed)
Spoke to the patient's son [name]) and arranged an OV with [name] 1/6 to discuss TEE.

## 2018-08-13 NOTE — Telephone Encounter (Signed)
-----   Message from Nelva Bush, MD sent at 08/13/2018 12:33 PM EST ----- Regarding: FW: Needs TEE Contact: Richland Lamount Cranker you are well.  Can you try to get [name] in to see [name] in the next week or two in order to discuss TEE?  It sounds like Dr. Roxy Manns is concerned about a paravalvular leak and would like the TEE for better evaluation and possible referral to [facility] for an intervention.  When the time comes to schedule the TEE, it might be best for Dr. Johnsie Cancel to do it, as he is the most experienced structural heart imager.  Let me know if any issues come up.  Thanks.  Gerald Stabs ----- Message ----- From: Marylen Ponto, LPN Sent: 12/75/1700  12:16 PM EST To: Nelva Bush, MD, Rexene Alberts, MD Subject: RE: Needs TEE                                  Hi Dr End,  [name] would like to go ahead with  TEE soon , can you have your staff set this up and contact his son, [name]) for date and time. Please let me know if I need to do anything. Thanks Linden Dolin   ----- Message ----- From: Rexene Alberts, MD Sent: 08/10/2018   5:48 PM EST To: Marylen Ponto, LPN Subject: RE: Juluis Rainier                                        I previously discussed the following option at his last visit:  TEE will need to be done in [place] I will look at TEE and send it to Dr. Rushie Chestnut at [facility] in [place] who does transcatheter closure of paravalvular leak  Once he has seen the TEE we would arrange for [name] to go to [place] for CT angiogram and consultation to see if he is a candidate for repair  I am happy to arrange if they are willing to pursue ----- Message ----- From: Marylen Ponto, LPN Sent: [id number]  11:01 AM EST To: Rexene Alberts, MD Subject: Rexene Edison (son)  called to left you know that his dad/James Curtis is having a 2nd blood transfusion today.08/10/2018 Ordered by  Hematology. [name] not scheduled to see you until 01/2019 with chest CT.  Please advise SW

## 2018-08-14 ENCOUNTER — Ambulatory Visit: Payer: Medicare Other | Admitting: Hematology

## 2018-08-15 ENCOUNTER — Other Ambulatory Visit: Payer: Self-pay | Admitting: Hematology and Oncology

## 2018-08-16 LAB — PNH PROFILE (-HIGH SENSITIVITY)

## 2018-08-17 ENCOUNTER — Encounter (HOSPITAL_COMMUNITY): Payer: Medicare Other

## 2018-08-17 DIAGNOSIS — D151 Benign neoplasm of heart: Secondary | ICD-10-CM | POA: Diagnosis not present

## 2018-08-17 DIAGNOSIS — Z952 Presence of prosthetic heart valve: Secondary | ICD-10-CM | POA: Diagnosis not present

## 2018-08-20 ENCOUNTER — Other Ambulatory Visit (HOSPITAL_COMMUNITY): Payer: Self-pay | Admitting: Internal Medicine

## 2018-08-20 ENCOUNTER — Encounter: Payer: Self-pay | Admitting: Physician Assistant

## 2018-08-20 ENCOUNTER — Encounter (HOSPITAL_COMMUNITY): Payer: Medicare Other

## 2018-08-20 ENCOUNTER — Ambulatory Visit (INDEPENDENT_AMBULATORY_CARE_PROVIDER_SITE_OTHER): Payer: Medicare Other | Admitting: Physician Assistant

## 2018-08-20 VITALS — BP 152/60 | HR 66 | Ht 67.5 in | Wt 209.1 lb

## 2018-08-20 DIAGNOSIS — I2510 Atherosclerotic heart disease of native coronary artery without angina pectoris: Secondary | ICD-10-CM

## 2018-08-20 DIAGNOSIS — N183 Chronic kidney disease, stage 3 unspecified: Secondary | ICD-10-CM

## 2018-08-20 DIAGNOSIS — T8203XD Leakage of heart valve prosthesis, subsequent encounter: Secondary | ICD-10-CM

## 2018-08-20 DIAGNOSIS — I10 Essential (primary) hypertension: Secondary | ICD-10-CM

## 2018-08-20 DIAGNOSIS — I5042 Chronic combined systolic (congestive) and diastolic (congestive) heart failure: Secondary | ICD-10-CM | POA: Diagnosis not present

## 2018-08-20 DIAGNOSIS — D598 Other acquired hemolytic anemias: Secondary | ICD-10-CM

## 2018-08-20 NOTE — Patient Instructions (Signed)
Medication Instructions:  Your physician recommends that you continue on your current medications as directed. Please refer to the Current Medication list given to you today.  If you need a refill on your cardiac medications before your next appointment, please call your pharmacy.   Lab work: NONE  If you have labs (blood work) drawn today and your tests are completely normal, you will receive your results only by: Marland Kitchen MyChart Message (if you have MyChart) OR . A paper copy in the mail If you have any lab test that is abnormal or we need to change your treatment, we will call you to review the results.  Testing/Procedures:  You are scheduled for a TEE on      with Dr.         .  Please arrive at the [facility] (Main Entrance A) at [facility]: [place], [place] [address] at         am/pm. (1 hour prior to procedure unless lab work is needed; if lab work is needed arrive 1.5 hours ahead) [place].  DIET: Nothing to eat or drink after midnight except a sip of water with medications (see medication instructions below)  You must have a responsible person to drive you home and stay in the waiting area during your procedure. Failure to do so could result in cancellation.  Bring your insurance cards.  *Special Note: Every effort is made to have your procedure done on time. Occasionally there are emergencies that occur at the hospital that may cause delays. Please be patient if a delay does occur.    Follow-Up: At [facility], you and your health needs are our priority.  As part of our continuing mission to provide you with exceptional heart care, we have created designated Provider Care Teams.  These Care Teams include your primary Cardiologist (physician) and Advanced Practice Providers (APPs -  Physician Assistants and Nurse Practitioners) who all work together to provide you with the care you need, when you need  it. You will need a follow up appointment in:  3 months.  You may see Sherren Mocha, MD or one of the following Advanced Practice Providers on your designated Care Team: [name], PA-C [place], [place] . Daune Perch, NP  YOU WILL NEED TO FOLLOW UP WITH DR. Curt Bears FIRST AVAILABLE APPOINTMENT    Any Other Special Instructions Will Be Listed Below (If Applicable).

## 2018-08-20 NOTE — Progress Notes (Signed)
Cardiology Office Note:    Date:  08/20/2018   ID:  James Curtis, DOB 11/19/1940, MRN [id number]  PCP:  Polite, Ronald, MD  Cardiologist:  Michael Cooper, MD   Electrophysiologist:  Will Martin Camnitz, MD   Referring MD: Polite, Ronald, MD   Chief Complaint  Patient presents with  . Arrange TEE     History of Present Illness:    James Curtis is a 77 y.o. 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   He was last seen in 03/2018.  A follow up echo in 9/19 did demonstrate mild to mod perivalvular aortic regurgitation.  I reviewed this with Dr. Cooper and we initially planned for a repeat echo in 6 mos.  Since then, he has been noted to be anemic and has been evaluated by Hematology (Dr. Ruben).  His anemia is suggestive of a hemolytic process likely related to his malfunctioning aortic valve. He underwent transfusion with PRBCs twice in 07/2018.      James Curtis returns to arrange a TEE.  He is here with his son, Michael.  James Curtis denies having any chest pain or significant shortness of breath.  He denies syncope, paroxysmal nocturnal dyspnea, leg swelling.  He is fairly sedentary.  A repeat hemoglobin with his PCP on 12/30 was 9.8.    Prior CV studies:   The following studies were reviewed today:  Chest CT 06/11/18 IMPRESSION: 1. Sub solid left lower lobe nodule is stable to minimally larger than on baseline examination 11/26/2016. Findings remain worrisome for low-grade adenocarcinoma. 2. Subpleural right upper lobe 6 mm nodule, stable from baseline 11/26/2016 and considered benign. 3. Pulmonary fibrotic changes may be due to nonspecific interstitial pneumonitis. 4.  Emphysema  (ICD10-J43.9). 5.  Aortic atherosclerosis (ICD10-170.0). 6. Enlarged pulmonary arteries, indicative of pulmonary arterial hypertension  Echo 04/17/18 Severe conc LVH, EF 55-60, no RWMA, Gr 2 DD, AVR with mild to mod perivalvular regurgitation, mod to severe MAC, severe LAE, mild to mod TR, PASP 42  Echo 01/26/17 EF 40-45, inferior and inferoseptal hypokinesis/akinesis, severe LVH, grade 1 diastolic dysfunction,s/p AVR with mean 16,MAC, severe LAE, mildly reduced RVSF  Intraoperative TEE 12/02/16 Severe concentric LVH, EF 60-65, mild to moderate aortic stenosis, mild MR  R/L heart cath 11/25/16 LAD irregularities LCx mild disease; OM3 40 RCA proximal 50 RA 4, mean PA 19, PCWP mean 9, PVR 1.3 WU Mild to mod AS - Mean AV gradient 22, AVA 2 cm; mean gradient on pullback 16, AVA 2.3 cm  ABIs 11/25/16 ABI&'s of the right lower extremity indicate mild/boarderline moderate peripheral arterial disease with monophasic flow. ABI&'s of the left lower extremity are within normal limits however the dorsal pedis demonstrates dampened monophasic flow.  R 0.8' L 1.06  TEE 11/23/16 Severe concentric LVH, EF 60-65, MAC, 16 x 4 mm mobile echodensity on atrial basal surface of posterior mitral valve, mild MR  Carotid US 11/20/16 Bilateral ICA 1-39  Echo 11/20/16 Mild concentric LVH, vigorous LVF, EF 65-70, normal wall motion, grade 1 diastolic dysfunction, moderate aortic stenosis (mean 26, peak 62), severe MAC, moderate mitral stenosis (mean 5), mild MR, 12 x 7 mm mobile echodensity attached to the posterior mitral valve leaflet,mild LAE, mild TR  Past Medical History:  Diagnosis Date  . Aortic stenosis    a. 11/20/2016 Echo: mod AS;    b. 11/23/2016 TEE restricted mobility of noncoronary cusp;  c. 11/2016 Cath: mean grad 73mHg w/ dual lumen catheter, AVA 2.0cm^2; mean gradient by pullback = 154mg, AVA 2.3cm^2.  . Cardiomyopathy ([facility] [facility] [place]   Echo 6/18: EF 40-45, inf/inf-septal HK-AK, Gr 1 DD, severe  LVH, MAC, severe LAE, mildly reduced RVSF  . Carotid arterial disease ([place]   a. 11/2016 Carotid U/S: bilat 1-39% ICA stenosis.  . Chronic kidney disease   . Complete heart block ([facility]   a. 11/21/2016 s/p MDT AzSantina EvansR MRI SureScan (ser # [id number]).  . Dementia (HCB[place]   mild  . Family history of adverse reaction to anesthesia    Son James Curtis a difficult time waking up  . Heart murmur   . Hemolytic anemia (HCWest[place]  . Hypercholesteremia   . Hypertension    benign  . Incidental pulmonary nodule, greater than or equal to 59m69m needs f/u scan 3 months 11/28/2016   14 x 8 mm (mean diameter of 11 mm) left lower lobe pulmonary nodule with some surrounding ground-glass attenuation (axial image 42 of series 6). 9 x 5 mm (mean diameter of 7 mm) subpleural nodule in the periphery of the right upper lobe (axial image 20 of series 6). A few patchy areas of peripheral predominant ground-glass attenuation and subpleural reticulation are noted, scattered randomly throu  . Left atrial mass   . 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 11/2009 TEE: EF 60-65%, restricted mobility of noncoronary AoV cusp, 16x4 mm mobile Ca2+ echodensity on the atrial basal surface of the posterior MV leaflet, mild MR, no LA/LAA thrombus.  . Non-obstructive CAD (coronary artery disease)    a. 11/2016 Cath: LM large, nl, LAD min irregs, LCX large, min irregs, OM3 40, RCA large, 69m66m. Paravalvular leak (prosthetic valve)    Echo 9/19: Severe concentric LVH, EF 55-60, normal wall motion, grade 2 diastolic dysfunction, bioprosthetic AVR with mild to moderate perivalvular regurgitation, moderate to severely calcified mitral valve annulus, severe LAE, mild to moderate TR, PASP 42  . Paravalvular leak (prosthetic valve), subsequent encounter   . Pre-diabetes   . Prostate cancer ([facility]CC[place] s/p radiation  . Renal mass of unknown nature - needs f/u MRI 11/28/2016   Multiple renal lesions are noted in the kidneys bilaterally.  Although several of these lesions are low-attenuation, compatible with cysts, other lesions are intermediate to high attenuation, potentially enhancing  . S/P aortic valve replacement with bioprosthetic valve 12/02/2016   23 mm Edwards Intuity rapid deployment bovine pericardial tissue valve  . S/P resection of left atrial mass 12/02/2016   Benign dystrophic fibrotic tissue with calcification attached to posterior mitral annulus by thin fibrotic stalk  . Stroke (HCC)[place]. TIA (transient ischemic attack)    a. 11/2016 in setting of complete heart block.   Surgical Hx: The patient  has a past surgical history that includes PACEMAKER IMPLANT (N/A, 11/21/2016); TEE without cardioversion (N/A, 11/23/2016); RIGHT/LEFT HEART CATH AND CORONARY ANGIOGRAPHY (N/A, 11/25/2016); Excision of atrial myxoma (N/A, 12/02/2016); TEE without cardioversion (N/A, 12/02/2016); and Aortic valve replacement (N/A, 12/02/2016).   Current Medications: Current Meds  Medication Sig  . amLODipine (NORVASC) 10 MG tablet Take 10 mg by mouth daily.  . amMarland Kitchenxicillin (AMOXIL) 500 MG tablet Take 4 tablets (2 grams) by mouth 30-60 minutes prior to dental visit.  . asMarland Kitchenirin EC 81 MG tablet Take 81 mg by mouth daily.  . Capsicum,  Cayenne, (CAYENNE PEPPER PO) Take by mouth. Takes 1 tablet po as directed. This is a complex tablet along with ginko biloba  . carvedilol (COREG) 6.25 MG tablet TAKE 1 TABLET BY MOUTH TWICE A DAY  . cholecalciferol (VITAMIN D) 1000 units tablet Take 1,000 Units by mouth daily.  . folic acid (FOLVITE) 1 MG tablet Take 1 tablet (1 mg total) by mouth daily.  . Ginkgo Biloba 40 MG TABS Take by mouth. Unsure dose. Complex tablet along with cayenne pepper. Takes as directed.  . iron polysaccharides (NIFEREX) 150 MG capsule Take 1 capsule (150 mg total) by mouth daily.  . losartan (COZAAR) 100 MG tablet Take 1 tablet (100 mg total) by mouth daily.  . Sage Leaf POWD by Does not apply route as directed.  . Tetrahydrozoline HCl  (VISINE OP) Apply to eye as directed.  . [DISCONTINUED] predniSONE (DELTASONE) 50 MG tablet 50 mg po bid with meals     Allergies:   Patient has no known allergies.   Social History   Tobacco Use  . Smoking status: Former Smoker    Packs/day: 0.25    Types: Cigarettes    Last attempt to quit: 11/16/2016    Years since quitting: 1.7  . Smokeless tobacco: Never Used  Substance Use Topics  . Alcohol use: No  . Drug use: No     Family Hx: The patient's family history includes Cancer in his father.  ROS:   Please see the history of present illness.    ROS All other systems reviewed and are negative.   EKGs/Labs/Other Test Reviewed:    EKG:  EKG is   ordered today.  The ekg ordered today demonstrates NSR, HR 66, V paced   Recent Labs: 07/19/2018: TSH 1.757 08/09/2018: ALT 12; BUN 15; Creatinine, Ser 1.72; Hemoglobin 7.5; Platelets 171; Potassium 3.8; Sodium 143   Recent Lipid Panel Lab Results  Component Value Date/Time   CHOL 106 11/19/2016 03:23 AM   TRIG 97 11/19/2016 03:23 AM   HDL 27 (L) 11/19/2016 03:23 AM   CHOLHDL 3.9 11/19/2016 03:23 AM   LDLCALC 60 11/19/2016 03:23 AM   From [facility] Cholesterol, total 170.000 05/30/2018 HDL 38.000 05/30/2018 LDL 114.000 05/30/2018 Triglycerides 94.000 05/30/2018 A1C 4.000 11/27/2017 Hemoglobin 9.800 08/13/2018 Creatinine, Serum 1.720 08/09/2018 Potassium 3.800 08/09/2018 ALT (SGPT) 12.000 08/09/2018 TSH 1.757 07/19/2018 INR 1.160 07/19/2018 Platelets 171.000 08/09/2018  Physical Exam:    VS:  BP (!) 152/60   Pulse 66   Ht 5' 7.5" (1.715 m)   Wt 209 lb 1.9 oz (94.9 kg)   SpO2 98%   BMI 32.27 kg/m     Wt Readings from Last 3 Encounters:  08/20/18 209 lb 1.9 oz (94.9 kg)  08/09/18 211 lb 1.6 oz (95.8 kg)  07/17/18 209 lb (94.8 kg)     Physical Exam  Constitutional: He is oriented to person, place, and time. He appears well-developed and well-nourished. No distress.  HENT:  Head: Normocephalic and atraumatic.    Eyes: No scleral icterus.  Neck: No JVD present. No thyromegaly present.  Cardiovascular: Normal rate and regular rhythm.  Murmur heard.  Systolic murmur is present with a grade of 3/6 at the upper right sternal border and upper left sternal border. Pulmonary/Chest: Effort normal. He has no rales.  Abdominal: Soft. He exhibits no distension.  Musculoskeletal:        General: No edema.  Lymphadenopathy:    He has no cervical adenopathy.  Neurological: He is alert and oriented   to person, place, and time.  Skin: Skin is warm and dry.  Psychiatric: He has a normal mood and affect.    ASSESSMENT & PLAN:    Paravalvular leak of prosthetic heart valve, subsequent encounter  The patient has been seen by Dr. Roxy Manns.  His hemolytic anemia is felt to be caused by his paravalvular leak.  He may be a candidate for a plugging procedure with Dr. Sabino Snipes at [facility] in [place].  He needs a transesophageal echocardiogram.  The risks and benefits of transesophageal echocardiogram have been explained including risks of esophageal damage, perforation (1:10,000 risk), as well as other potential complications associated with conscious sedation including aspiration, arrhythmia, respiratory failure.  All questions were answered. Patient is willing to proceed.  We will try to get his TEE scheduled with Dr. Johnsie Cancel, Dr. Meda Coffee or Dr. Sallyanne Kuster.  I have spoken to our hospital coordinator.  She will work on getting the procedure scheduled later this week and call the patient with his procedure date and time.    Chronic combined systolic and diastolic CHF (congestive heart failure) ([place])  EF was 40-45 but has improved to 55-60 by most recent echo in 9/19.  NYHA 2.  Volume status appears stable.  Continue beta-blocker, angiotensin receptor blocker.  Given Dr. Darnelle Bos transition to [facility], the patient will follow with Dr. Burt Knack and me here.  I will have him see Dr. Burt Knack in 3 mos.   Coronary artery disease involving  native coronary artery of native heart without angina pectoris  Mild to mod non-obstructive CAD prior to AVR in 4/18.  He denies chest pain.  Continue ASA.  He has declined statin Rx in the past.  Essential hypertension BP has been above target.  The patient and his son have been resistant in the past to further medication adjustments for management of blood pressure.  He was recently not taking his medications correctly but is now taking them as prescribed.  Continue to monitor.  Consider increasing Coreg if BP remains above target.    Chronic kidney disease, stage III (moderate) (HCC) Recent creatinine stable.   Dispo:  Return in about 3 months (around 11/19/2018) for Routine Follow Up, w/ Dr. Burt Knack.   Medication Adjustments/Labs and Tests Ordered: Current medicines are reviewed at length with the patient today.  Concerns regarding medicines are outlined above.  Tests Ordered: Orders Placed This Encounter  Procedures  . EKG 12-Lead   Medication Changes: No orders of the defined types were placed in this encounter.   Signed, [name], PA-C  08/20/2018 1:49 PM    [facility] [place], [place], [place]  [address] Phone: [phone]; Fax: [phone]

## 2018-08-20 NOTE — H&P (View-Only) (Signed)
Cardiology Office Note:    Date:  08/20/2018   ID:  James Curtis, DOB 05-Jan-1941, MRN [id number]  PCP:  James Carol, MD  Cardiologist:  James Mocha, MD   Electrophysiologist:  James Haw, MD   Referring MD: James Carol, MD   Chief Complaint  Patient presents with  . Arrange TEE     History of Present Illness:    James Curtis is a 78 y.o. 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   He was last seen in 03/2018.  A follow up echo in 9/19 did demonstrate mild to mod perivalvular aortic regurgitation.  I reviewed this with Dr. Burt Curtis and we initially planned for a repeat echo in 6 mos.  Since then, he has been noted to be anemic and has been evaluated by Hematology (James Curtis).  His anemia is suggestive of a hemolytic process likely related to his malfunctioning aortic valve. He underwent transfusion with PRBCs twice in 07/2018.      James Curtis returns to arrange a TEE.  He is here with his son, James Curtis.  James Curtis denies having any chest pain or significant shortness of breath.  He denies syncope, paroxysmal nocturnal dyspnea, leg swelling.  He is fairly sedentary.  A repeat hemoglobin with his PCP on 12/30 was 9.8.    Prior CV studies:   The following studies were reviewed today:  Chest CT 06/11/18 IMPRESSION: 1. Sub solid left lower lobe nodule is stable to minimally larger than on baseline examination 11/26/2016. Findings remain worrisome for low-grade adenocarcinoma. 2. Subpleural right upper lobe 6 mm nodule, stable from baseline 11/26/2016 and considered benign. 3. Pulmonary fibrotic changes may be due to nonspecific interstitial pneumonitis. 4.  Emphysema  (ICD10-J43.9). 5.  Aortic atherosclerosis (ICD10-170.0). 6. Enlarged pulmonary arteries, indicative of pulmonary arterial hypertension  Echo 04/17/18 Severe conc LVH, EF 55-60, no RWMA, Gr 2 DD, AVR with mild to mod perivalvular regurgitation, mod to severe MAC, severe LAE, mild to mod TR, PASP 42  Echo 01/26/17 EF 40-45, inferior and inferoseptal hypokinesis/akinesis, severe LVH, grade 1 diastolic dysfunction,s/p AVR with mean 16,MAC, severe LAE, mildly reduced RVSF  Intraoperative TEE 12/02/16 Severe concentric LVH, EF 60-65, mild to moderate aortic stenosis, mild MR  R/L heart cath 11/25/16 LAD irregularities LCx mild disease; OM3 40 RCA proximal 50 RA 4, mean PA 19, PCWP mean 9, PVR 1.3 WU Mild to mod AS - Mean AV gradient 22, AVA 2 cm; mean gradient on pullback 16, AVA 2.3 cm  ABIs 11/25/16 ABI&'s of the right lower extremity indicate mild/boarderline moderate peripheral arterial disease with monophasic flow. ABI&'s of the left lower extremity are within normal limits however the dorsal pedis demonstrates dampened monophasic flow.  R 0.8' L 1.06  TEE 11/23/16 Severe concentric LVH, EF 60-65, MAC, 16 x 4 mm mobile echodensity on atrial basal surface of posterior mitral valve, mild MR  Carotid US 11/20/16 Bilateral ICA 1-39  Echo 11/20/16 Mild concentric LVH, vigorous LVF, EF 65-70, normal wall motion, grade 1 diastolic dysfunction, moderate aortic stenosis (mean 26, peak 62), severe MAC, moderate mitral stenosis (mean 5), mild MR, 12 x 7 mm mobile echodensity attached to the posterior mitral valve leaflet,mild LAE, mild TR  Past Medical History:  Diagnosis Date  . Aortic stenosis    a. 11/20/2016 Echo: mod AS;  b. 11/23/2016 TEE restricted mobility of noncoronary cusp;  c. 11/2016 Cath: mean grad 73mHg w/ dual lumen catheter, AVA 2.0cm^2; mean gradient by pullback = 154mg, AVA 2.3cm^2.  . Cardiomyopathy ([facility]   Echo 6/18: EF 40-45, inf/inf-septal HK-AK, Gr 1 DD, severe  LVH, MAC, severe LAE, mildly reduced RVSF  . Carotid arterial disease ([place]   a. 11/2016 Carotid U/S: bilat 1-39% ICA stenosis.  . Chronic kidney disease   . Complete heart block ([facility]   a. 11/21/2016 s/p MDT AzSantina EvansR MRI SureScan (ser # [id number]).  . Dementia ([facility]   mild  . Family history of adverse reaction to anesthesia    Son James Comoad a difficult time waking up  . Heart murmur   . Hemolytic anemia ([facility]  . Hypercholesteremia   . Hypertension    benign  . Incidental pulmonary nodule, greater than or equal to 59m69m needs f/u scan 3 months 11/28/2016   14 x 8 mm (mean diameter of 11 mm) left lower lobe pulmonary nodule with some surrounding ground-glass attenuation (axial image 42 of series 6). 9 x 5 mm (mean diameter of 7 mm) subpleural nodule in the periphery of the right upper lobe (axial image 20 of series 6). A few patchy areas of peripheral predominant ground-glass attenuation and subpleural reticulation are noted, scattered randomly throu  . Left atrial mass   . 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 11/2009 TEE: EF 60-65%, restricted mobility of noncoronary AoV cusp, 16x4 mm mobile Ca2+ echodensity on the atrial basal surface of the posterior MV leaflet, mild MR, no LA/LAA thrombus.  . Non-obstructive CAD (coronary artery disease)    a. 11/2016 Cath: LM large, nl, LAD min irregs, LCX large, min irregs, OM3 40, RCA large, 69m66m. Paravalvular leak (prosthetic valve)    Echo 9/19: Severe concentric LVH, EF 55-60, normal wall motion, grade 2 diastolic dysfunction, bioprosthetic AVR with mild to moderate perivalvular regurgitation, moderate to severely calcified mitral valve annulus, severe LAE, mild to moderate TR, PASP 42  . Paravalvular leak (prosthetic valve), subsequent encounter   . Pre-diabetes   . Prostate cancer ([facility] [place] s/p radiation  . Renal mass of unknown nature - needs f/u MRI 11/28/2016   Multiple renal lesions are noted in the kidneys bilaterally.  Although several of these lesions are low-attenuation, compatible with cysts, other lesions are intermediate to high attenuation, potentially enhancing  . S/P aortic valve replacement with bioprosthetic valve 12/02/2016   23 mm Edwards Intuity rapid deployment bovine pericardial tissue valve  . S/P resection of left atrial mass 12/02/2016   Benign dystrophic fibrotic tissue with calcification attached to posterior mitral annulus by thin fibrotic stalk  . Stroke (HCC)[place]. TIA (transient ischemic attack)    a. 11/2016 in setting of complete heart block.   Surgical Hx: The patient  has a past surgical history that includes PACEMAKER IMPLANT (N/A, 11/21/2016); TEE without cardioversion (N/A, 11/23/2016); RIGHT/LEFT HEART CATH AND CORONARY ANGIOGRAPHY (N/A, 11/25/2016); Excision of atrial myxoma (N/A, 12/02/2016); TEE without cardioversion (N/A, 12/02/2016); and Aortic valve replacement (N/A, 12/02/2016).   Current Medications: Current Meds  Medication Sig  . amLODipine (NORVASC) 10 MG tablet Take 10 mg by mouth daily.  . amMarland Kitchenxicillin (AMOXIL) 500 MG tablet Take 4 tablets (2 grams) by mouth 30-60 minutes prior to dental visit.  . asMarland Kitchenirin EC 81 MG tablet Take 81 mg by mouth daily.  . Capsicum,  Cayenne, (CAYENNE PEPPER PO) Take by mouth. Takes 1 tablet po as directed. This is a complex tablet along with ginko biloba  . carvedilol (COREG) 6.25 MG tablet TAKE 1 TABLET BY MOUTH TWICE A DAY  . cholecalciferol (VITAMIN D) 1000 units tablet Take 1,000 Units by mouth daily.  . folic acid (FOLVITE) 1 MG tablet Take 1 tablet (1 mg total) by mouth daily.  . Ginkgo Biloba 40 MG TABS Take by mouth. Unsure dose. Complex tablet along with cayenne pepper. Takes as directed.  . iron polysaccharides (NIFEREX) 150 MG capsule Take 1 capsule (150 mg total) by mouth daily.  Marland Kitchen losartan (COZAAR) 100 MG tablet Take 1 tablet (100 mg total) by mouth daily.  Lia Hopping Leaf POWD by Does not apply route as directed.  . Tetrahydrozoline HCl  (VISINE OP) Apply to eye as directed.  . [DISCONTINUED] predniSONE (DELTASONE) 50 MG tablet 50 mg po bid with meals     Allergies:   Patient has no known allergies.   Social History   Tobacco Use  . Smoking status: Former Smoker    Packs/day: 0.25    Types: Cigarettes    Last attempt to quit: 11/16/2016    Years since quitting: 1.7  . Smokeless tobacco: Never Used  Substance Use Topics  . Alcohol use: No  . Drug use: No     Family Hx: The patient's family history includes Cancer in his father.  ROS:   Please see the history of present illness.    ROS All other systems reviewed and are negative.   EKGs/Labs/Other Test Reviewed:    EKG:  EKG is   ordered today.  The ekg ordered today demonstrates NSR, HR 66, V paced   Recent Labs: 07/19/2018: TSH 1.757 08/09/2018: ALT 12; BUN 15; Creatinine, Ser 1.72; Hemoglobin 7.5; Platelets 171; Potassium 3.8; Sodium 143   Recent Lipid Panel Lab Results  Component Value Date/Time   CHOL 106 11/19/2016 03:23 AM   TRIG 97 11/19/2016 03:23 AM   HDL 27 (L) 11/19/2016 03:23 AM   CHOLHDL 3.9 11/19/2016 03:23 AM   LDLCALC 60 11/19/2016 03:23 AM   From [facility] Cholesterol, total 170.000 05/30/2018 HDL 38.000 05/30/2018 LDL 114.000 05/30/2018 Triglycerides 94.000 05/30/2018 A1C 4.000 11/27/2017 Hemoglobin 9.800 08/13/2018 Creatinine, Serum 1.720 08/09/2018 Potassium 3.800 08/09/2018 ALT (SGPT) 12.000 08/09/2018 TSH 1.757 07/19/2018 INR 1.160 07/19/2018 Platelets 171.000 08/09/2018  Physical Exam:    VS:  BP (!) 152/60   Pulse 66   Ht 5' 7.5" (1.715 m)   Wt 209 lb 1.9 oz (94.9 kg)   SpO2 98%   BMI 32.27 kg/m     Wt Readings from Last 3 Encounters:  08/20/18 209 lb 1.9 oz (94.9 kg)  08/09/18 211 lb 1.6 oz (95.8 kg)  07/17/18 209 lb (94.8 kg)     Physical Exam  Constitutional: He is oriented to person, place, and time. He appears well-developed and well-nourished. No distress.  HENT:  Head: Normocephalic and atraumatic.    Eyes: No scleral icterus.  Neck: No JVD present. No thyromegaly present.  Cardiovascular: Normal rate and regular rhythm.  Murmur heard.  Systolic murmur is present with a grade of 3/6 at the upper right sternal border and upper left sternal border. Pulmonary/Chest: Effort normal. He has no rales.  Abdominal: Soft. He exhibits no distension.  Musculoskeletal:        General: No edema.  Lymphadenopathy:    He has no cervical adenopathy.  Neurological: He is alert and oriented  to person, place, and time.  Skin: Skin is warm and dry.  Psychiatric: He has a normal mood and affect.    ASSESSMENT & PLAN:    Paravalvular leak of prosthetic heart valve, subsequent encounter  The patient has been seen by Dr. Roxy Manns.  His hemolytic anemia is felt to be caused by his paravalvular leak.  He may be a candidate for a plugging procedure with Dr. Sabino Snipes at [facility] in [place].  He needs a transesophageal echocardiogram.  The risks and benefits of transesophageal echocardiogram have been explained including risks of esophageal damage, perforation (1:10,000 risk), as well as other potential complications associated with conscious sedation including aspiration, arrhythmia, respiratory failure.  All questions were answered. Patient is willing to proceed.  We will try to get his TEE scheduled with Dr. Johnsie Cancel, Dr. Meda Coffee or Dr. Sallyanne Kuster.  I have spoken to our hospital coordinator.  She will work on getting the procedure scheduled later this week and call the patient with his procedure date and time.    Chronic combined systolic and diastolic CHF (congestive heart failure) ([place])  EF was 40-45 but has improved to 55-60 by most recent echo in 9/19.  NYHA 2.  Volume status appears stable.  Continue beta-blocker, angiotensin receptor blocker.  Given Dr. Darnelle Bos transition to [facility], the patient will follow with Dr. Burt Curtis and me here.  I will have him see Dr. Burt Curtis in 3 mos.   Coronary artery disease involving  native coronary artery of native heart without angina pectoris  Mild to mod non-obstructive CAD prior to AVR in 4/18.  He denies chest pain.  Continue ASA.  He has declined statin Rx in the past.  Essential hypertension BP has been above target.  The patient and his son have been resistant in the past to further medication adjustments for management of blood pressure.  He was recently not taking his medications correctly but is now taking them as prescribed.  Continue to monitor.  Consider increasing Coreg if BP remains above target.    Chronic kidney disease, stage III (moderate) (HCC) Recent creatinine stable.   Dispo:  Return in about 3 months (around 11/19/2018) for Routine Follow Up, w/ Dr. Burt Curtis.   Medication Adjustments/Labs and Tests Ordered: Current medicines are reviewed at length with the patient today.  Concerns regarding medicines are outlined above.  Tests Ordered: Orders Placed This Encounter  Procedures  . EKG 12-Lead   Medication Changes: No orders of the defined types were placed in this encounter.   Signed, [name], PA-C  08/20/2018 1:49 PM    [facility] [place], [place], [place]  [address] Phone: [phone]; Fax: [phone]

## 2018-08-20 NOTE — Addendum Note (Signed)
Addended byKathlen Mody, Nicki Reaper T on: 08/20/2018 01:51 PM   Modules accepted: Orders, SmartSet

## 2018-08-22 ENCOUNTER — Ambulatory Visit (INDEPENDENT_AMBULATORY_CARE_PROVIDER_SITE_OTHER): Payer: Medicare Other

## 2018-08-22 ENCOUNTER — Ambulatory Visit (HOSPITAL_COMMUNITY)
Admission: RE | Admit: 2018-08-22 | Discharge: 2018-08-22 | Disposition: A | Discharge status: Home / Self Care | Payer: Medicare Other | Attending: Cardiovascular Disease | Admitting: Cardiovascular Disease

## 2018-08-22 ENCOUNTER — Other Ambulatory Visit: Payer: Self-pay

## 2018-08-22 ENCOUNTER — Encounter (HOSPITAL_COMMUNITY)
Admission: RE | Disposition: A | Discharge status: Home / Self Care | Payer: Self-pay | Source: Home / Self Care | Attending: Cardiovascular Disease

## 2018-08-22 ENCOUNTER — Encounter (HOSPITAL_COMMUNITY): Payer: Self-pay | Admitting: *Deleted

## 2018-08-22 ENCOUNTER — Encounter (HOSPITAL_COMMUNITY): Payer: Medicare Other

## 2018-08-22 ENCOUNTER — Ambulatory Visit (HOSPITAL_BASED_OUTPATIENT_CLINIC_OR_DEPARTMENT_OTHER): Payer: Medicare Other

## 2018-08-22 DIAGNOSIS — I340 Nonrheumatic mitral (valve) insufficiency: Secondary | ICD-10-CM | POA: Diagnosis not present

## 2018-08-22 DIAGNOSIS — Z87891 Personal history of nicotine dependence: Secondary | ICD-10-CM | POA: Insufficient documentation

## 2018-08-22 DIAGNOSIS — E1122 Type 2 diabetes mellitus with diabetic chronic kidney disease: Secondary | ICD-10-CM | POA: Diagnosis not present

## 2018-08-22 DIAGNOSIS — E785 Hyperlipidemia, unspecified: Secondary | ICD-10-CM | POA: Insufficient documentation

## 2018-08-22 DIAGNOSIS — I442 Atrioventricular block, complete: Secondary | ICD-10-CM | POA: Insufficient documentation

## 2018-08-22 DIAGNOSIS — I2510 Atherosclerotic heart disease of native coronary artery without angina pectoris: Secondary | ICD-10-CM | POA: Diagnosis not present

## 2018-08-22 DIAGNOSIS — Z8673 Personal history of transient ischemic attack (TIA), and cerebral infarction without residual deficits: Secondary | ICD-10-CM | POA: Diagnosis not present

## 2018-08-22 DIAGNOSIS — Z7982 Long term (current) use of aspirin: Secondary | ICD-10-CM | POA: Insufficient documentation

## 2018-08-22 DIAGNOSIS — Z953 Presence of xenogenic heart valve: Secondary | ICD-10-CM | POA: Diagnosis not present

## 2018-08-22 DIAGNOSIS — Z79899 Other long term (current) drug therapy: Secondary | ICD-10-CM | POA: Insufficient documentation

## 2018-08-22 DIAGNOSIS — I6523 Occlusion and stenosis of bilateral carotid arteries: Secondary | ICD-10-CM | POA: Insufficient documentation

## 2018-08-22 DIAGNOSIS — I080 Rheumatic disorders of both mitral and aortic valves: Secondary | ICD-10-CM | POA: Diagnosis not present

## 2018-08-22 DIAGNOSIS — I429 Cardiomyopathy, unspecified: Secondary | ICD-10-CM | POA: Diagnosis not present

## 2018-08-22 DIAGNOSIS — I420 Dilated cardiomyopathy: Secondary | ICD-10-CM | POA: Insufficient documentation

## 2018-08-22 DIAGNOSIS — D571 Sickle-cell disease without crisis: Secondary | ICD-10-CM | POA: Diagnosis not present

## 2018-08-22 DIAGNOSIS — X58XXXD Exposure to other specified factors, subsequent encounter: Secondary | ICD-10-CM | POA: Insufficient documentation

## 2018-08-22 DIAGNOSIS — T8203XD Leakage of heart valve prosthesis, subsequent encounter: Secondary | ICD-10-CM

## 2018-08-22 DIAGNOSIS — I5042 Chronic combined systolic (congestive) and diastolic (congestive) heart failure: Secondary | ICD-10-CM | POA: Insufficient documentation

## 2018-08-22 DIAGNOSIS — J439 Emphysema, unspecified: Secondary | ICD-10-CM | POA: Insufficient documentation

## 2018-08-22 DIAGNOSIS — N183 Chronic kidney disease, stage 3 (moderate): Secondary | ICD-10-CM | POA: Insufficient documentation

## 2018-08-22 DIAGNOSIS — E1151 Type 2 diabetes mellitus with diabetic peripheral angiopathy without gangrene: Secondary | ICD-10-CM | POA: Insufficient documentation

## 2018-08-22 DIAGNOSIS — I130 Hypertensive heart and chronic kidney disease with heart failure and stage 1 through stage 4 chronic kidney disease, or unspecified chronic kidney disease: Secondary | ICD-10-CM | POA: Insufficient documentation

## 2018-08-22 HISTORY — PX: TEE WITHOUT CARDIOVERSION: SHX5443

## 2018-08-22 SURGERY — ECHOCARDIOGRAM, TRANSESOPHAGEAL
Anesthesia: Moderate Sedation

## 2018-08-22 MED ORDER — MIDAZOLAM HCL (PF) 5 MG/ML IJ SOLN
INTRAMUSCULAR | Status: AC
  Filled 2018-08-22: qty 1

## 2018-08-22 MED ORDER — FENTANYL CITRATE (PF) 100 MCG/2ML IJ SOLN
INTRAMUSCULAR | Status: DC | PRN
  Administered 2018-08-22 (×2): 25 ug via INTRAVENOUS

## 2018-08-22 MED ORDER — FENTANYL CITRATE (PF) 100 MCG/2ML IJ SOLN
INTRAMUSCULAR | Status: AC
  Filled 2018-08-22: qty 2

## 2018-08-22 MED ORDER — MIDAZOLAM HCL (PF) 10 MG/2ML IJ SOLN
INTRAMUSCULAR | Status: DC | PRN
  Administered 2018-08-22: 2 mg via INTRAVENOUS
  Administered 2018-08-22: 1 mg via INTRAVENOUS
  Administered 2018-08-22: 2 mg via INTRAVENOUS

## 2018-08-22 MED ORDER — BUTAMBEN-TETRACAINE-BENZOCAINE 2-2-14 % EX AERO
INHALATION_SPRAY | CUTANEOUS | Status: DC | PRN
  Administered 2018-08-22: 2 via TOPICAL

## 2018-08-22 NOTE — CV Procedure (Signed)
TEE: During this procedure the patient is administered a total of Versed 5 mg and Fentanyl 50 mg to achieve and maintain moderate conscious sedation.  The patient's heart rate, blood pressure, and oxygen saturation are monitored continuously during the procedure. The period of conscious sedation is 40 minutes, of which I was present face-to-face 100% of this time.  3D imaging of AV performed  Moderate AR. Leaflets appear thickened. Appears to be more related to leaflet perforation than PVL will have to review images off line MAC mild MR Severe LVH EF 50-55% Severe LAE LAA seems occluded but with color flow Normal aortic root Pacing wires in RV  [employer]

## 2018-08-22 NOTE — Interval H&P Note (Signed)
History and Physical Interval Note:  08/22/2018 9:57 AM  James Curtis  has presented today for surgery, with the diagnosis of weakened prosthetic valve  The various methods of treatment have been discussed with the patient and family. After consideration of risks, benefits and other options for treatment, the patient has consented to  Procedure(s): TRANSESOPHAGEAL ECHOCARDIOGRAM (TEE) (N/A) as a surgical intervention .  The patient's history has been reviewed, patient examined, no change in status, stable for surgery.  I have reviewed the patient's chart and labs.  Questions were answered to the patient's satisfaction.     Jenkins Rouge

## 2018-08-22 NOTE — Discharge Instructions (Signed)

## 2018-08-22 NOTE — Progress Notes (Signed)
Echocardiogram Echocardiogram Transesophageal has been performed.  08/22/2018 12:05 PM Maudry Mayhew, MHA, RVT, RDCS, RDMS  Roseanna Rainbow, RDCS

## 2018-08-23 ENCOUNTER — Inpatient Hospital Stay: Payer: Medicare Other | Attending: Oncology

## 2018-08-23 ENCOUNTER — Other Ambulatory Visit: Payer: Self-pay

## 2018-08-23 ENCOUNTER — Other Ambulatory Visit (HOSPITAL_COMMUNITY): Payer: Self-pay | Admitting: Internal Medicine

## 2018-08-23 ENCOUNTER — Telehealth: Payer: Self-pay

## 2018-08-23 DIAGNOSIS — I351 Nonrheumatic aortic (valve) insufficiency: Secondary | ICD-10-CM | POA: Diagnosis not present

## 2018-08-23 DIAGNOSIS — D598 Other acquired hemolytic anemias: Secondary | ICD-10-CM

## 2018-08-23 DIAGNOSIS — D649 Anemia, unspecified: Secondary | ICD-10-CM | POA: Insufficient documentation

## 2018-08-23 DIAGNOSIS — D599 Acquired hemolytic anemia, unspecified: Secondary | ICD-10-CM | POA: Diagnosis not present

## 2018-08-23 DIAGNOSIS — R740 Nonspecific elevation of levels of transaminase and lactic acid dehydrogenase [LDH]: Secondary | ICD-10-CM | POA: Diagnosis not present

## 2018-08-23 LAB — COMPREHENSIVE METABOLIC PANEL
ALK PHOS: 58 U/L (ref 38–126)
ALT: 12 U/L (ref 0–44)
AST: 55 U/L — ABNORMAL HIGH (ref 15–41)
Albumin: 4 g/dL (ref 3.5–5.0)
Anion gap: 9 (ref 5–15)
BUN: 17 mg/dL (ref 8–23)
CALCIUM: 8.9 mg/dL (ref 8.9–10.3)
CO2: 23 mmol/L (ref 22–32)
Chloride: 109 mmol/L (ref 98–111)
Creatinine, Ser: 1.61 mg/dL — ABNORMAL HIGH (ref 0.61–1.24)
GFR calc Af Amer: 47 mL/min — ABNORMAL LOW (ref >60)
GFR calc non Af Amer: 40 mL/min — ABNORMAL LOW (ref >60)
Glucose, Bld: 107 mg/dL — ABNORMAL HIGH (ref 70–99)
Potassium: 4.3 mmol/L (ref 3.5–5.1)
Sodium: 141 mmol/L (ref 135–145)
Total Bilirubin: 1.6 mg/dL — ABNORMAL HIGH (ref 0.3–1.2)
Total Protein: 6.9 g/dL (ref 6.5–8.1)

## 2018-08-23 LAB — CBC WITH DIFFERENTIAL/PLATELET
Abs Immature Granulocytes: 0.01 10*3/uL (ref 0.00–0.07)
Basophils Absolute: 0 10*3/uL (ref 0.0–0.1)
Basophils Relative: 1 %
Eosinophils Absolute: 0.1 10*3/uL (ref 0.0–0.5)
Eosinophils Relative: 2 %
HCT: 24.2 % — ABNORMAL LOW (ref 39.0–52.0)
Hemoglobin: 7.9 g/dL — ABNORMAL LOW (ref 13.0–17.0)
Immature Granulocytes: 0 %
Lymphocytes Relative: 12 %
Lymphs Abs: 0.6 10*3/uL — ABNORMAL LOW (ref 0.7–4.0)
MCH: 30.5 pg (ref 26.0–34.0)
MCHC: 32.6 g/dL (ref 30.0–36.0)
MCV: 93.4 fL (ref 80.0–100.0)
Monocytes Absolute: 0.5 10*3/uL (ref 0.1–1.0)
Monocytes Relative: 11 %
Neutro Abs: 3.6 10*3/uL (ref 1.7–7.7)
Neutrophils Relative %: 74 %
Platelets: 151 10*3/uL (ref 150–400)
RBC: 2.59 MIL/uL — ABNORMAL LOW (ref 4.22–5.81)
RDW: 15.7 % — AB (ref 11.5–15.5)
WBC: 4.9 10*3/uL (ref 4.0–10.5)
nRBC: 0 % (ref 0.0–0.2)

## 2018-08-23 LAB — CUP PACEART REMOTE DEVICE CHECK
Battery Remaining Longevity: 103 mo
Battery Voltage: 3 V
Brady Statistic AP VP Percent: 52.16 %
Brady Statistic AP VS Percent: 0 %
Brady Statistic AS VP Percent: 47.83 %
Brady Statistic AS VS Percent: 0 %
Brady Statistic RA Percent Paced: 52.11 %
Brady Statistic RV Percent Paced: 99.99 %
Date Time Interrogation Session: 20200108111530
Implantable Lead Implant Date: 20180409
Implantable Lead Implant Date: 20180409
Implantable Lead Location: 753859
Implantable Lead Location: 753860
Implantable Lead Model: 5076
Implantable Lead Model: 5076
Lead Channel Impedance Value: 247 Ohm
Lead Channel Impedance Value: 323 Ohm
Lead Channel Impedance Value: 380 Ohm
Lead Channel Impedance Value: 380 Ohm
Lead Channel Pacing Threshold Amplitude: 0.75 V
Lead Channel Pacing Threshold Pulse Width: 0.4 ms
Lead Channel Pacing Threshold Pulse Width: 0.4 ms
Lead Channel Sensing Intrinsic Amplitude: 1.375 mV
Lead Channel Sensing Intrinsic Amplitude: 1.375 mV
Lead Channel Sensing Intrinsic Amplitude: 15.375 mV
Lead Channel Sensing Intrinsic Amplitude: 16.75 mV
Lead Channel Setting Pacing Amplitude: 2.25 V
Lead Channel Setting Pacing Amplitude: 2.5 V
Lead Channel Setting Pacing Pulse Width: 0.4 ms
Lead Channel Setting Sensing Sensitivity: 2.8 mV
MDC IDC MSMT LEADCHNL RA PACING THRESHOLD AMPLITUDE: 1.125 V
MDC IDC PG IMPLANT DT: 20180409

## 2018-08-23 LAB — SAMPLE TO BLOOD BANK

## 2018-08-23 LAB — LACTATE DEHYDROGENASE: LDH: 1720 U/L — ABNORMAL HIGH (ref 98–192)

## 2018-08-23 LAB — PREPARE RBC (CROSSMATCH)

## 2018-08-23 NOTE — Telephone Encounter (Signed)
Per 1/9 sch msg and communication between [facility] and [employer] concerning a blood for the patient and weekly labs. Labs are scheduled, but no blood on week 1 due to cap day. Will be ordered if needed will also add to the book

## 2018-08-23 NOTE — Telephone Encounter (Signed)
Patient's CBC resulted and patient and patient's son in lobby requesting to speak to Dr. Walden Field nurse about possible blood transfusion. CBC reviewed by Dr. Walden Field. Per Dr. Walden Field, no transfusion is needed today. Went to lobby to inform patient's son. Patient's son stated that patient's hemoglobin is 7.9 today and patient needs blood. Patient's son also stated "see, this is what I'm talking about."  "My dad's hemoglobin was 9.5 at Dr. Lina Sar office on 12/30 and is 7.9 today. If we wait until next week, my dad's hemoglobin may be critical at 5 or below." Explained to patient's son that results of labs drawn and Dr. Lina Sar office have not been received by Dr. Walden Field and per Dr. Walden Field consistent labs have been obtained at he [facility] and weekly lab appointments will be set up at the [facility]. Patient's son stated :"you should be able to see the results and it is her job to keep in contact with Dr. [name]".  Asked if patient is symptomatic and patient's son stated "yes". "I have noticed cognitive memory loss in my father". Also informed patient and his son that per Dr. Walden Field, patient should decrease Prednisone from 100 mg daily to 50 mg daily. Patient's son stated "oh, we never started that." Questioned as to why the med was not started and patient's son stated "we talked to Dr. Roxy Manns and he stated no steroids." Patient scheduled for 1 unit PRBC's at 8am 08/24/2018. Patient and son aware. Spoke to [place] in blood bank and confirmed orders were received.

## 2018-08-23 NOTE — Progress Notes (Signed)
Remote pacemaker transmission.   

## 2018-08-24 ENCOUNTER — Inpatient Hospital Stay: Payer: Medicare Other

## 2018-08-24 ENCOUNTER — Encounter (HOSPITAL_COMMUNITY): Payer: Medicare Other

## 2018-08-24 DIAGNOSIS — D649 Anemia, unspecified: Secondary | ICD-10-CM | POA: Diagnosis not present

## 2018-08-24 DIAGNOSIS — R740 Nonspecific elevation of levels of transaminase and lactic acid dehydrogenase [LDH]: Secondary | ICD-10-CM | POA: Diagnosis not present

## 2018-08-24 DIAGNOSIS — I351 Nonrheumatic aortic (valve) insufficiency: Secondary | ICD-10-CM | POA: Diagnosis not present

## 2018-08-24 DIAGNOSIS — D598 Other acquired hemolytic anemias: Secondary | ICD-10-CM

## 2018-08-24 DIAGNOSIS — D599 Acquired hemolytic anemia, unspecified: Secondary | ICD-10-CM | POA: Diagnosis not present

## 2018-08-24 LAB — HAPTOGLOBIN: Haptoglobin: <10 mg/dL — ABNORMAL LOW (ref 34–355)

## 2018-08-24 MED ORDER — SODIUM CHLORIDE 0.9% IV SOLUTION
250.0000 mL | Freq: Once | INTRAVENOUS | Status: AC
  Administered 2018-08-24: 250 mL via INTRAVENOUS
  Filled 2018-08-24: qty 250

## 2018-08-24 NOTE — Patient Instructions (Signed)
Blood Transfusion, Adult, Care After This sheet gives you information about how to care for yourself after your procedure. Your doctor may also give you more specific instructions. If you have problems or questions, contact your doctor. Follow these instructions at home:   Take over-the-counter and prescription medicines only as told by your doctor.  Go back to your normal activities as told by your doctor.  Follow instructions from your doctor about how to take care of the area where an IV tube was put into your vein (insertion site). Make sure you: ? Wash your hands with soap and water before you change your bandage (dressing). If there is no soap and water, use hand sanitizer. ? Change your bandage as told by your doctor.  Check your IV insertion site every day for signs of infection. Check for: ? More redness, swelling, or pain. ? More fluid or blood. ? Warmth. ? Pus or a bad smell. Contact a doctor if:  You have more redness, swelling, or pain around the IV insertion site.  You have more fluid or blood coming from the IV insertion site.  Your IV insertion site feels warm to the touch.  You have pus or a bad smell coming from the IV insertion site.  Your pee (urine) turns pink, red, or brown.  You feel weak after doing your normal activities. Get help right away if:  You have signs of a serious allergic or body defense (immune) system reaction, including: ? Itchiness. ? Hives. ? Trouble breathing. ? Anxiety. ? Pain in your chest or lower back. ? Fever, flushing, and chills. ? Fast pulse. ? Rash. ? Watery poop (diarrhea). ? Throwing up (vomiting). ? Dark pee. ? Serious headache. ? Dizziness. ? Stiff neck. ? Yellow color in your face or the white parts of your eyes (jaundice). Summary  After a blood transfusion, return to your normal activities as told by your doctor.  Every day, check for signs of infection where the IV tube was put into your vein.  Some  signs of infection are warm skin, more redness and pain, more fluid or blood, and pus or a bad smell where the needle went in.  Contact your doctor if you feel weak or have any unusual symptoms. This information is not intended to replace advice given to you by your health care provider. Make sure you discuss any questions you have with your health care provider. Document Released: 08/22/2014 Document Revised: 03/25/2016 Document Reviewed: 03/25/2016 Elsevier Interactive Patient Education  2019 Elsevier Inc.  

## 2018-08-27 ENCOUNTER — Telehealth (HOSPITAL_COMMUNITY): Payer: Self-pay | Admitting: Emergency Medicine

## 2018-08-27 ENCOUNTER — Other Ambulatory Visit: Payer: Self-pay | Admitting: *Deleted

## 2018-08-27 ENCOUNTER — Encounter (HOSPITAL_COMMUNITY): Payer: Self-pay | Admitting: Emergency Medicine

## 2018-08-27 ENCOUNTER — Encounter (HOSPITAL_COMMUNITY): Payer: Medicare Other

## 2018-08-27 ENCOUNTER — Other Ambulatory Visit: Payer: Self-pay | Admitting: Physician Assistant

## 2018-08-27 DIAGNOSIS — I712 Thoracic aortic aneurysm, without rupture, unspecified: Secondary | ICD-10-CM

## 2018-08-27 DIAGNOSIS — I71019 Dissection of thoracic aorta, unspecified: Secondary | ICD-10-CM

## 2018-08-27 DIAGNOSIS — I7101 Dissection of thoracic aorta: Secondary | ICD-10-CM

## 2018-08-27 LAB — BPAM RBC
Blood Product Expiration Date: 202002082359
ISSUE DATE / TIME: 202001100906
UNIT TYPE AND RH: 7300

## 2018-08-27 LAB — TYPE AND SCREEN
ABO/RH(D): B POS
Antibody Screen: NEGATIVE
Unit division: 0

## 2018-08-27 NOTE — Telephone Encounter (Signed)
James Curtis  , patients son, informed that patient will now need to come in early for biarb infusion pre- and post- cardiac CT on 08/30/18 Marchia Bond RN Navigator Cardiac Imaging [phone]

## 2018-08-27 NOTE — Telephone Encounter (Signed)
Reaching out to patient to offer assistance regarding upcoming cardiac imaging study; pt verbalizes understanding of appt date/time, parking situation and where to check in, pre-test NPO status and medications ordered, and verified current allergies; name and call back number provided for further questions should they arise Charnel Giles RN Navigator Cardiac Imaging [phone] 

## 2018-08-29 ENCOUNTER — Encounter (HOSPITAL_COMMUNITY): Payer: Medicare Other

## 2018-08-29 ENCOUNTER — Other Ambulatory Visit: Payer: Self-pay | Admitting: *Deleted

## 2018-08-29 ENCOUNTER — Telehealth (HOSPITAL_COMMUNITY): Payer: Self-pay | Admitting: *Deleted

## 2018-08-29 DIAGNOSIS — D598 Other acquired hemolytic anemias: Secondary | ICD-10-CM

## 2018-08-29 NOTE — Telephone Encounter (Signed)
Patient's son James Curtis called to inform me his father James Curtis will need to be discharged from the cardiac rehab maintenance program due to medical issues. Son aware billing will discontinue after January. Appointments cancelled in [place].

## 2018-08-30 ENCOUNTER — Ambulatory Visit (HOSPITAL_COMMUNITY): Payer: Medicare Other

## 2018-08-30 ENCOUNTER — Inpatient Hospital Stay: Payer: Medicare Other

## 2018-08-30 ENCOUNTER — Encounter (HOSPITAL_COMMUNITY): Payer: Self-pay

## 2018-08-30 ENCOUNTER — Telehealth: Payer: Self-pay | Admitting: Internal Medicine

## 2018-08-30 ENCOUNTER — Ambulatory Visit (HOSPITAL_COMMUNITY)
Admission: RE | Admit: 2018-08-30 | Discharge: 2018-08-30 | Disposition: A | Discharge status: Home / Self Care | Payer: Medicare Other | Source: Ambulatory Visit | Attending: Thoracic Surgery (Cardiothoracic Vascular Surgery) | Admitting: Thoracic Surgery (Cardiothoracic Vascular Surgery)

## 2018-08-30 ENCOUNTER — Encounter (HOSPITAL_COMMUNITY)
Admission: RE | Admit: 2018-08-30 | Discharge: 2018-08-30 | Disposition: A | Discharge status: Home / Self Care | Payer: Medicare Other | Source: Ambulatory Visit | Attending: Cardiology | Admitting: Cardiology

## 2018-08-30 ENCOUNTER — Telehealth (HOSPITAL_COMMUNITY): Payer: Self-pay | Admitting: Emergency Medicine

## 2018-08-30 DIAGNOSIS — I7101 Dissection of thoracic aorta: Secondary | ICD-10-CM | POA: Diagnosis not present

## 2018-08-30 DIAGNOSIS — I351 Nonrheumatic aortic (valve) insufficiency: Secondary | ICD-10-CM | POA: Diagnosis not present

## 2018-08-30 DIAGNOSIS — D649 Anemia, unspecified: Secondary | ICD-10-CM | POA: Diagnosis not present

## 2018-08-30 DIAGNOSIS — R740 Nonspecific elevation of levels of transaminase and lactic acid dehydrogenase [LDH]: Secondary | ICD-10-CM | POA: Diagnosis not present

## 2018-08-30 DIAGNOSIS — D599 Acquired hemolytic anemia, unspecified: Secondary | ICD-10-CM | POA: Diagnosis not present

## 2018-08-30 DIAGNOSIS — I71019 Dissection of thoracic aorta, unspecified: Secondary | ICD-10-CM

## 2018-08-30 DIAGNOSIS — Z952 Presence of prosthetic heart valve: Secondary | ICD-10-CM | POA: Diagnosis not present

## 2018-08-30 DIAGNOSIS — D598 Other acquired hemolytic anemias: Secondary | ICD-10-CM

## 2018-08-30 LAB — CBC WITH DIFFERENTIAL/PLATELET
Abs Immature Granulocytes: 0.02 10*3/uL (ref 0.00–0.07)
Basophils Absolute: 0 10*3/uL (ref 0.0–0.1)
Basophils Relative: 1 %
Eosinophils Absolute: 0.1 10*3/uL (ref 0.0–0.5)
Eosinophils Relative: 2 %
HEMATOCRIT: 24.2 % — AB (ref 39.0–52.0)
Hemoglobin: 7.9 g/dL — ABNORMAL LOW (ref 13.0–17.0)
Immature Granulocytes: 0 %
Lymphocytes Relative: 12 %
Lymphs Abs: 0.6 10*3/uL — ABNORMAL LOW (ref 0.7–4.0)
MCH: 30.3 pg (ref 26.0–34.0)
MCHC: 32.6 g/dL (ref 30.0–36.0)
MCV: 92.7 fL (ref 80.0–100.0)
Monocytes Absolute: 0.5 10*3/uL (ref 0.1–1.0)
Monocytes Relative: 10 %
Neutro Abs: 3.8 10*3/uL (ref 1.7–7.7)
Neutrophils Relative %: 75 %
Platelets: 165 10*3/uL (ref 150–400)
RBC: 2.61 MIL/uL — ABNORMAL LOW (ref 4.22–5.81)
RDW: 15.3 % (ref 11.5–15.5)
WBC: 5 10*3/uL (ref 4.0–10.5)
nRBC: 0 % (ref 0.0–0.2)

## 2018-08-30 LAB — COMPREHENSIVE METABOLIC PANEL
ALT: 12 U/L (ref 0–44)
AST: 52 U/L — ABNORMAL HIGH (ref 15–41)
Albumin: 4.1 g/dL (ref 3.5–5.0)
Alkaline Phosphatase: 61 U/L (ref 38–126)
Anion gap: 8 (ref 5–15)
BUN: 16 mg/dL (ref 8–23)
CO2: 23 mmol/L (ref 22–32)
CREATININE: 1.54 mg/dL — AB (ref 0.61–1.24)
Calcium: 9 mg/dL (ref 8.9–10.3)
Chloride: 108 mmol/L (ref 98–111)
GFR calc Af Amer: 49 mL/min — ABNORMAL LOW (ref >60)
GFR calc non Af Amer: 43 mL/min — ABNORMAL LOW (ref >60)
Glucose, Bld: 99 mg/dL (ref 70–99)
Potassium: 4.4 mmol/L (ref 3.5–5.1)
Sodium: 139 mmol/L (ref 135–145)
Total Bilirubin: 2 mg/dL — ABNORMAL HIGH (ref 0.3–1.2)
Total Protein: 6.9 g/dL (ref 6.5–8.1)

## 2018-08-30 LAB — LACTATE DEHYDROGENASE: LDH: 1725 U/L — ABNORMAL HIGH (ref 98–192)

## 2018-08-30 LAB — SAMPLE TO BLOOD BANK

## 2018-08-30 MED ORDER — SODIUM BICARBONATE 8.4 % IV SOLN
INTRAVENOUS | Status: DC
  Filled 2018-08-30 (×2): qty 500

## 2018-08-30 MED ORDER — SODIUM BICARBONATE 8.4 % IV SOLN
Freq: Once | INTRAVENOUS | Status: AC
  Administered 2018-08-30: 13:00:00 via INTRAVENOUS
  Filled 2018-08-30: qty 500

## 2018-08-30 MED ORDER — IOPAMIDOL (ISOVUE-370) INJECTION 76%
100.0000 mL | Freq: Once | INTRAVENOUS | Status: AC | PRN
  Administered 2018-08-30: 100 mL via INTRAVENOUS

## 2018-08-30 MED ORDER — SODIUM BICARBONATE BOLUS VIA INFUSION: INTRAVENOUS | Status: DC

## 2018-08-30 NOTE — Telephone Encounter (Signed)
Patient came in to cancel appt with MD.  Patient is requesting a new provider and I cancelled the doc visit for 01/23 per the patient request.  The patient has Melissa D. contact info and will be calling her to discuss the issue and concerns he is having.

## 2018-08-30 NOTE — Telephone Encounter (Signed)
Pt left d/t long wait, called patient to tell him that med day has a space to start his infusion, is now on his way back  Marchia Bond RN [place] [phone]

## 2018-08-30 NOTE — Progress Notes (Signed)
Pt to CT via wheelchair with transport. Bicarb infusing at 5ml/hr as ordered. Condition stable at this time.

## 2018-08-31 ENCOUNTER — Encounter (HOSPITAL_COMMUNITY): Payer: Medicare Other

## 2018-08-31 ENCOUNTER — Ambulatory Visit (INDEPENDENT_AMBULATORY_CARE_PROVIDER_SITE_OTHER): Payer: Medicare Other | Admitting: Thoracic Surgery (Cardiothoracic Vascular Surgery)

## 2018-08-31 ENCOUNTER — Other Ambulatory Visit: Payer: Self-pay

## 2018-08-31 ENCOUNTER — Other Ambulatory Visit: Payer: Self-pay | Admitting: *Deleted

## 2018-08-31 ENCOUNTER — Encounter: Payer: Self-pay | Admitting: Thoracic Surgery (Cardiothoracic Vascular Surgery)

## 2018-08-31 ENCOUNTER — Telehealth: Payer: Self-pay | Admitting: Hematology and Oncology

## 2018-08-31 VITALS — BP 157/84 | HR 67 | Resp 18 | Ht 67.5 in | Wt 210.2 lb

## 2018-08-31 DIAGNOSIS — Z952 Presence of prosthetic heart valve: Secondary | ICD-10-CM | POA: Diagnosis not present

## 2018-08-31 DIAGNOSIS — I2510 Atherosclerotic heart disease of native coronary artery without angina pectoris: Secondary | ICD-10-CM | POA: Diagnosis not present

## 2018-08-31 DIAGNOSIS — R911 Solitary pulmonary nodule: Secondary | ICD-10-CM | POA: Diagnosis not present

## 2018-08-31 DIAGNOSIS — T8203XD Leakage of heart valve prosthesis, subsequent encounter: Secondary | ICD-10-CM

## 2018-08-31 DIAGNOSIS — D594 Other nonautoimmune hemolytic anemias: Secondary | ICD-10-CM

## 2018-08-31 LAB — HAPTOGLOBIN: Haptoglobin: <10 mg/dL — ABNORMAL LOW (ref 34–355)

## 2018-08-31 LAB — PREPARE RBC (CROSSMATCH)

## 2018-08-31 NOTE — Patient Instructions (Signed)
Continue all previous medications without any changes at this time  

## 2018-08-31 NOTE — Progress Notes (Signed)
[address]       [place] [address]             [phone]     CARDIOTHORACIC SURGERY OFFICE NOTE  Referring Provider is Constance Haw, MD  Primary Cardiologist is Sherren Mocha, MD PCP is [name], MD   HPI:  Patient is a 78 year old African-American male with history of aortic stenosis status post aortic valve replacement using a 23 mm Edwards Intuity rapid deployment stented bovine pericardial tissue valve on 12/02/2016, complete heart block status post permanent pacemaker placement, long-standing hypertension, hyperlipidemia, tobacco abuse, type 2 diabetes mellitus, and mild dementia who returns to the office today for follow-up of paravalvular leak with ongoing hemolysis.  The patient initially did quite well following his surgery in 2018, and routine follow-up echocardiogram performed January 26, 2017 revealed normal functioning bioprosthetic tissue valve without any aortic insufficiency.  At that time peak velocity across the aortic valve measured 2.7 m/s corresponding to mean transvalvular gradient estimated 16 mmHg.  Several months ago the patient was found to have anemia.  Routine follow-up echocardiogram performed April 17, 2018 revealed mild to moderate paravalvular leak new from the previous echocardiogram.  Left ventricular function was normal.  Peak velocity across the aortic valve was reported 2.9 m/s corresponding to mean transvalvular gradient estimated 18 mmHg.  I had the opportunity to evaluate the patient for routine follow-up on June 20, 2018.  At that time the patient claimed to be entirely asymptomatic.  Subsequent blood work confirmed the presence of ongoing hemolysis with haptoglobin less than 10 and schistocytes seen on peripheral blood smears.  The patient was referred to hematology and over the past 6 weeks has required several blood transfusions for severe anemia.  Transesophageal echocardiogram and cardiac gated CT angiogram  of the heart were performed, and the patient returns to our office for follow-up today.  The patient is accompanied to the office by his son.  The patient claims that he feels fine.  He specifically denies any symptoms of exertional shortness of breath or fatigue.  He has not had any dizzy spells.  He has no fevers or chills, nor other constitutional symptoms.  The patient's son notes that the patient's short-term memory may be somewhat worse than it had been in the past.  Current Outpatient Medications  Medication Sig Dispense Refill  . amLODipine (NORVASC) 10 MG tablet Take 10 mg by mouth daily.  11  . amoxicillin (AMOXIL) 500 MG tablet Take 4 tablets (2 grams) by mouth 30-60 minutes prior to dental visit. 12 tablet 1  . aspirin EC 81 MG tablet Take 81 mg by mouth daily.    . Capsicum, Cayenne, (CAYENNE PEPPER PO) Take by mouth. Takes 1 tablet po as directed. This is a complex tablet along with ginko biloba    . carvedilol (COREG) 6.25 MG tablet TAKE 1 TABLET BY MOUTH TWICE A DAY 180 tablet 2  . cholecalciferol (VITAMIN D) 1000 units tablet Take 1,000 Units by mouth daily.    . Ginkgo Biloba 40 MG TABS Take by mouth. Unsure dose. Complex tablet along with cayenne pepper. Takes as directed.    . iron polysaccharides (NIFEREX) 150 MG capsule Take 1 capsule (150 mg total) by mouth daily.    Lia Hopping Leaf POWD by Does not apply route as directed. BID    . Tetrahydrozoline HCl (VISINE OP) Apply to eye as directed.    . folic acid (FOLVITE) 1 MG tablet  Take 1 tablet (1 mg total) by mouth daily. (Patient not taking: Reported on 08/31/2018) 30 tablet 1  . losartan (COZAAR) 100 MG tablet Take 1 tablet (100 mg total) by mouth daily. 90 tablet 3   No current facility-administered medications for this visit.       Physical Exam:   BP (!) 157/84 (BP Location: Left Arm, Patient Position: Sitting, Cuff Size: Large)   Pulse 67   Resp 18   Ht 5' 7.5" (1.715 m)   Wt 210 lb 3.2 oz (95.3 kg)   SpO2 96%  Comment: RA  BMI 32.44 kg/m   General:  Elderly but well-appearing  Chest:   Clear to auscultation  CV:   Regular rate and rhythm with systolic murmur heard best along the right sternal border  Incisions:  n/a  Abdomen:  Soft nontender  Extremities:  Warm and well-perfused  Diagnostic Tests:  Echocardiography  Patient:    James Curtis, James Curtis MR #:       [id number] Study Date: 04/17/2018 Gender:     M Age:        69 Height:     171.5 cm Weight:     94.9 kg BSA:        2.16 m^2 Pt. Status: Room:   SONOGRAPHER  Miller, Will  ATTENDING    Kathlen Mody, Scott T  Faylene Million, Scott T  REFERRING    Kathlen Mody, Scott T  PERFORMING   [facility]  cc:  ------------------------------------------------------------------- LV EF: 55% -   60%  ------------------------------------------------------------------- Indications:      (I50.42).  ------------------------------------------------------------------- History:   PMH:  S/p AVR (50mm Edwards Intuity rapid deployment bovine pericardial tissue valve). Acquired from the patient and from the patient&'s chart.  Congestive heart failure.  Risk factors:  Current tobacco use. Hypertension. Diabetes mellitus. Dyslipidemia.  ------------------------------------------------------------------- Study Conclusions  - Left ventricle: The cavity size was normal. There was severe   concentric hypertrophy. Systolic function was normal. The   estimated ejection fraction was in the range of 55% to 60%. Wall   motion was normal; there were no regional wall motion   abnormalities. Features are consistent with a pseudonormal left   ventricular filling pattern, with concomitant abnormal relaxation   and increased filling pressure (grade 2 diastolic dysfunction). - Ventricular septum: Septal motion showed abnormal function,   dyssynergy, and paradox. These changes are consistent with right   ventricular pacing. - Aortic valve: A  bioprosthesis was present. There was   mild-moderate perivalvular regurgitation. - Mitral valve: Moderately to severely calcified annulus. - Left atrium: The atrium was severely dilated. - Atrial septum: No defect or patent foramen ovale was identified. - Tricuspid valve: There was mild-moderate regurgitation directed   centrally. - Pulmonary arteries: Systolic pressure was mildly increased. PA   peak pressure: 42 mm Hg (S).  Impressions:  - Compared to the June 2018 study:   - left ventricular systolic function is better; no ischemic   appearing wall motion abnormalities are seen, but there is   pacing-related apical dyssynchrony;   - aortic insufficiency is substantially worse and appears to be   perivalvular, at the medial aspect of the valve annulus;   - transvalvular gradients are not meaningfully changed;   - left ventricular filling pressures are higher.  Recommendations:  Consider transesophageal echocardiography to evaluate periprosthetic aortic insufficiency, especially if there is concern for endocarditis.  ------------------------------------------------------------------- Labs, prior tests, procedures, and surgery: Permanent pacemaker system implantation.  ------------------------------------------------------------------- Study data:  Comparison was made to the study of 01/26/2017.  Study status:  Routine.  Procedure:  The patient reported no pain pre or post test. Transthoracic echocardiography. Image quality was adequate.  Study completion:  There were no complications. Echocardiography.  M-mode, complete 2D, spectral Doppler, and color Doppler.  Birthdate:  Patient birthdate: 01-07-1941.  Age:  Patient is 78 yr old.  Sex:  Gender: male.    BMI: 32.3 kg/m^2.  Blood pressure:     158/74  Patient status:  Outpatient.  Study date: Study date: 04/17/2018. Study time: 01:06 PM.  Location:  [facility]  3  -------------------------------------------------------------------  ------------------------------------------------------------------- Left ventricle:  The cavity size was normal. There was severe concentric hypertrophy. Systolic function was normal. The estimated ejection fraction was in the range of 55% to 60%. Wall motion was normal; there were no regional wall motion abnormalities. Features are consistent with a pseudonormal left ventricular filling pattern, with concomitant abnormal relaxation and increased filling pressure (grade 2 diastolic dysfunction).  ------------------------------------------------------------------- Aortic valve:  A bioprosthesis was present.  Doppler:   There was mild-moderate perivalvular regurgitation.      VTI ratio of LVOT to aortic valve: 0.4. Valve area (VTI): 1.25 cm^2. Indexed valve area (VTI): 0.58 cm^2/m^2. Peak velocity ratio of LVOT to aortic valve: 0.37. Valve area (Vmax): 1.17 cm^2. Indexed valve area (Vmax): 0.54 cm^2/m^2. Mean velocity ratio of LVOT to aortic valve: 0.42. Valve area (Vmean): 1.31 cm^2. Indexed valve area (Vmean): 0.61 cm^2/m^2.    Mean gradient (S): 18 mm Hg. Peak gradient (S): 35 mm Hg.  ------------------------------------------------------------------- Aorta:  Aortic root: The aortic root was normal in size. Ascending aorta: The ascending aorta was mildly dilated.  ------------------------------------------------------------------- Mitral valve:   Moderately to severely calcified annulus. Leaflet separation was normal.  Doppler:  Transvalvular velocity was within the normal range. There was no evidence for stenosis. There was no regurgitation.    Valve area by pressure half-time: 2.62 cm^2. Indexed valve area by pressure half-time: 1.21 cm^2/m^2.    Peak gradient (D): 8 mm Hg.  ------------------------------------------------------------------- Left atrium:  The atrium was severely  dilated.  ------------------------------------------------------------------- Atrial septum:  No defect or patent foramen ovale was identified.   ------------------------------------------------------------------- Right ventricle:  The cavity size was normal. Pacer wire or catheter noted in right ventricle. Systolic function was normal.   ------------------------------------------------------------------- Ventricular septum:   Septal motion showed abnormal function, dyssynergy, and paradox. These changes are consistent with right ventricular pacing.  ------------------------------------------------------------------- Pulmonic valve:    Structurally normal valve.   Cusp separation was normal.  Doppler:  Transvalvular velocity was within the normal range. There was mild regurgitation.  ------------------------------------------------------------------- Tricuspid valve:   Structurally normal valve.   Leaflet separation was normal.  Doppler:  Transvalvular velocity was within the normal range. There was mild-moderate regurgitation directed centrally.   ------------------------------------------------------------------- Pulmonary artery:   Systolic pressure was mildly increased.  ------------------------------------------------------------------- Right atrium:  The atrium was normal in size.  ------------------------------------------------------------------- Pericardium:  There was no pericardial effusion.  ------------------------------------------------------------------- Measurements   Left ventricle                           Value          Reference  LV ID, ED, PLAX chordal                  52    mm       43 - 52  LV ID, ES, PLAX  chordal                  37    mm       23 - 38  LV fx shortening, PLAX chordal           29    %        >=29  LV PW thickness, ED                      19.58 mm       ----------  IVS/LV PW ratio, ED                      1.02           <=1.3   Stroke volume, 2D                        81    ml       ----------  Stroke volume/bsa, 2D                    38    ml/m^2   ----------  LV e&', lateral                           5.72  cm/s     ----------  LV E/e&', lateral                         24.83          ----------  LV e&', medial                            4.13  cm/s     ----------  LV E/e&', medial                          34.38          ----------  LV e&', average                           4.93  cm/s     ----------  LV E/e&', average                         28.83          ----------    Ventricular septum                       Value          Reference  IVS thickness, ED                        19.95 mm       ----------    LVOT                                     Value          Reference  LVOT ID, S                               20  mm       ----------  LVOT area                                3.14  cm^2     ----------  LVOT ID                                  20    mm       ----------  LVOT peak velocity, S                    111   cm/s     ----------  LVOT mean velocity, S                    81.5  cm/s     ----------  LVOT VTI, S                              25.8  cm       ----------  Stroke volume (SV), LVOT DP              81.1  ml       ----------  Stroke index (SV/bsa), LVOT DP           37.6  ml/m^2   ----------    Aortic valve                             Value          Reference  Aortic valve peak velocity, S            297   cm/s     ----------  Aortic valve mean velocity, S            195   cm/s     ----------  Aortic valve VTI, S                      64.9  cm       ----------  Aortic mean gradient, S                  18    mm Hg    ----------  Aortic peak gradient, S                  35    mm Hg    ----------  VTI ratio, LVOT/AV                       0.4            ----------  Aortic valve area, VTI                   1.25  cm^2     ----------  Aortic valve area/bsa, VTI               0.58  cm^2/m^2 ----------   Velocity ratio, peak, LVOT/AV            0.37           ----------  Aortic valve area, peak velocity         1.17  cm^2     ----------  Aortic valve  area/bsa, peak              0.54  cm^2/m^2 ----------  velocity  Velocity ratio, mean, LVOT/AV            0.42           ----------  Aortic valve area, mean velocity         1.31  cm^2     ----------  Aortic valve area/bsa, mean              0.61  cm^2/m^2 ----------  velocity  Aortic regurg pressure half-time         420   ms       ----------    Aorta                                    Value          Reference  Aortic root ID, ED                       39    mm       ----------  Ascending aorta ID, A-P, S               39    mm       ----------    Left atrium                              Value          Reference  LA ID, A-P, ES                           51    mm       ----------  LA ID/bsa, A-P                   (H)     2.36  cm/m^2   <=2.2  LA volume, ES, 1-p A4C                   63.5  ml       ----------  LA volume/bsa, ES, 1-p A4C               29.4  ml/m^2   ----------  LA volume, ES, 1-p A2C                   101   ml       ----------  LA volume/bsa, ES, 1-p A2C               46.8  ml/m^2   ----------    Mitral valve                             Value          Reference  Mitral E-wave peak velocity              142   cm/s     ----------  Mitral A-wave peak velocity              160   cm/s     ----------  Mitral deceleration time         (H)     288  ms       150 - 230  Mitral pressure half-time                84    ms       ----------  Mitral peak gradient, D                  8     mm Hg    ----------  Mitral E/A ratio, peak                   0.9            ----------  Mitral valve area, PHT, DP               2.62  cm^2     ----------  Mitral valve area/bsa, PHT, DP           1.21  cm^2/m^2 ----------    Pulmonary arteries                       Value          Reference  PA pressure, S, DP               (H)     42    mm Hg    <=30     Tricuspid valve                          Value          Reference  Tricuspid regurg peak velocity           312   cm/s     ----------  Tricuspid peak RV-RA gradient            39    mm Hg    ----------    Right atrium                             Value          Reference  RA ID, S-I, ES, A4C              (H)     67.2  mm       34 - 49  RA area, ES, A4C                 (H)     19.9  cm^2     8.3 - 19.5  RA volume, ES, A/L                       49.2  ml       ----------  RA volume/bsa, ES, A/L                   22.8  ml/m^2   ----------    Systemic veins                           Value          Reference  Estimated CVP                            3     mm Hg    ----------    Right ventricle  Value          Reference  TAPSE                                    10.7  mm       ----------  RV pressure, S, DP               (H)     42    mm Hg    <=30  RV s&', lateral, S                        8.7   cm/s     ----------  Legend: (L)  and  (H)  mark values outside specified reference range.  ------------------------------------------------------------------- Prepared and Electronically Authenticated by  Sanda Klein, MD 2019-09-03T17:03:19    Transesophageal Echocardiography  Patient:    James Curtis, James Curtis MR #:       [id number] Study Date: 08/22/2018 Gender:     M Age:        89 Height:     171.5 cm Weight:     94.9 kg BSA:        2.16 m^2 Pt. Status: Room:   ADMITTING    Jenkins Rouge, M.D.  ATTENDING    Jenkins Rouge, M.D.  PERFORMING   Jenkins Rouge, M.D.  Faylene Million, [place], Scott T  SONOGRAPHER  Maudry Mayhew, RVT, RDCS, RDMS  SONOGRAPHER  Roseanna Rainbow  cc:  ------------------------------------------------------------------- LV EF: 55% -   60%  ------------------------------------------------------------------- Indications:      424.1 Aortic valve  disorders.  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 Non- ischemic cardiomyopathy.  Congestive heart failure. Transient ischemic attack.  Risk factors:  Former tobacco use. Hypertension. Diabetes mellitus. Dyslipidemia.  ------------------------------------------------------------------- Study Conclusions  - Left ventricle: Systolic function was normal. The estimated   ejection fraction was in the range of 55% to 60%. - Aortic valve: Intuity Edwards bioprosthetic valve 23 mm The valve   is not ideally visualized due to shadowing from the stents. 3D   imaging also suboptimal Moderate appearing AR. I reviewed his TTE   as well. Particularly on 3D imaging # 34 the regurgitation seems   to be more from a tear in the base of one of the leaflets and not   PVL. The trans-gastric gradients are also elevated mean 21 mmHg   peak 39 mmHg. The annulus appeared stable with no obvious gaps.   On SA images there may be two areas of regurgitation one at 9:00   and on 3D images at 12:00 which appears to be more within the   annulus at the base of a leaflet - Mitral valve: There was mild regurgitation. - Left atrium: The LAA not well visualized Op note does not report   it being excluded The atrium was severely dilated. - Right ventricle: Pacing wires seen in RA/RV. - Right atrium: The atrium was dilated. - Atrial septum: No defect or patent foramen ovale was identified.  ------------------------------------------------------------------- Study data:   Study status:  Routine.  Consent:  The risks, benefits, and alternatives to the procedure were explained to the patient and informed consent was obtained.  Procedure:  The patient reported no pain pre or post test. Initial setup. The patient was brought to the laboratory.  Surface ECG leads were monitored. Sedation. Conscious sedation was administered  by cardiology staff. Transesophageal echocardiography. Topical anesthesia was obtained using viscous lidocaine. An adult multiplane transesophageal probe was inserted by the attending cardiologistwithout difficulty. Image quality was adequate.  Study completion:  The patient tolerated the procedure well. There were no complications.  Administered medications:   Fentanyl, 65mcg, IV.  Midazolam, 5mg , IV. Diagnostic transesophageal echocardiography.  2D and color Doppler.  Birthdate:  Patient birthdate: Oct 26, 1940.  Age:  Patient is 78 yr old.  Sex:  Gender: male.    BMI: 32.3 kg/m^2.  Blood pressure: 160/127  Patient status:  Inpatient.  Study date:  Study date: 08/22/2018. Study time: 10:53 AM.  Location:  Endoscopy.  -------------------------------------------------------------------  ------------------------------------------------------------------- Left ventricle:  Systolic function was normal. The estimated ejection fraction was in the range of 55% to 60%.  ------------------------------------------------------------------- Aortic valve:  Intuity Edwards bioprosthetic valve 23 mm The valve is not ideally visualized due to shadowing from the stents. 3D imaging also suboptimal Moderate appearing AR. I reviewed his TTE as well. Particularly on 3D imaging # 34 the regurgitation seems to be more from a tear in the base of one of the leaflets and not PVL. The trans-gastric gradients are also elevated mean 21 mmHg peak 39 mmHg. The annulus appeared stable with no obvious gaps. On SA images there may be two areas of regurgitation one at 9:00 and on 3D images at 12:00 which appears to be more within the annulus at the base of a leaflet  Doppler:     Mean gradient (S): 21 mm Hg. Peak gradient (S): 38 mm Hg.  ------------------------------------------------------------------- Mitral valve:   Severely thickened, severely calcified leaflets . Doppler:  There was mild  regurgitation.  ------------------------------------------------------------------- Left atrium:  The LAA not well visualized Op note does not report it being excluded The atrium was severely dilated.  ------------------------------------------------------------------- Atrial septum:  No defect or patent foramen ovale was identified.   ------------------------------------------------------------------- Right ventricle:  Pacing wires seen in RA/RV. The cavity size was normal. Wall thickness was normal. Systolic function was normal.   ------------------------------------------------------------------- Pulmonic valve:    Doppler:  There was mild regurgitation.  ------------------------------------------------------------------- Tricuspid valve:   Doppler:  There was mild regurgitation.  ------------------------------------------------------------------- Right atrium:  The atrium was dilated.  ------------------------------------------------------------------- Pericardium:  The pericardium was normal in appearance. There was no pericardial effusion.  ------------------------------------------------------------------- Measurements   Aortic valve                       Value  Aortic valve peak velocity, S      307   cm/s  Aortic valve mean velocity, S      212   cm/s  Aortic valve VTI, S                69.6  cm  Aortic mean gradient, S            21    mm Hg  Aortic peak gradient, S            38    mm Hg  Legend: (L)  and  (H)  mark values outside specified reference range.  ------------------------------------------------------------------- Prepared and Electronically Authenticated by  Jenkins Rouge, M.D. 2020-01-08T14:54:54    Cardiac TAVR CT  TECHNIQUE: The patient was scanned on a [employer]. A 120 kV retrospective scan was triggered in the descending thoracic aorta at 111 HU's. Gantry rotation speed was 250 msecs and collimation  was .6 mm. No  beta blockade or nitro were given. The 3D data set was reconstructed in 5% intervals of the R-R cycle. Systolic and diastolic phases were analyzed on a dedicated work station using MPR, MIP and VRT modes. The patient received 80 cc of contrast.  FINDINGS: Aortic Valve: 23 mm Edwards Intuity Elite pericardial tissue valve sits in the aortic position. Leaflets appear thickened by there is no obvious perforation. There is a gap between valve ring and non-coronary sinus measuring 3.6 x 3.4 mm.  Aorta: Normal size with moderate diffuse atherosclerotic plaque and calcifications and no dissection.  Sinotubular Junction: 32 x 32 mm  Ascending Thoracic Aorta: 38 x 36 mm  Aortic Arch: 32 x 28 mm  Sinus of Valsalva Measurements:  Non-coronary: 34 mm  Right -coronary: 35 mm  Left -coronary: 34 mm  Bioprosthetic valve ring measurements:  Maximum/Minimum Diameter: 21.2 x 19.2 mm  Mean Diameter: 19.9 mm  Perimeter: 63.1 mm  Area: 311 mm2  IMPRESSION: 1. 23 mm Edwards Intuity Elite pericardial tissue valve sits in the aortic position. Leaflets appear thickened by there is no obvious perforation. There is a gap between bioprosthetic valve ring and non-coronary sinus measuring 3.6 x 3.4 mm that is most probably culprit for the perivalvular leak.  2. Sufficient coronary to annulus distance.  3. No thrombus in the left atrial appendage.  4. Dilated pulmonary artery measuring 34 mm suggestive of pulmonary hypertension.   Electronically Signed   By: Ena Dawley   On: 08/30/2018 17:38   CT ANGIOGRAPHY CHEST, ABDOMEN AND PELVIS  TECHNIQUE: Multidetector CT imaging through the chest, abdomen and pelvis was performed using the standard protocol during bolus administration of intravenous contrast. Multiplanar reconstructed images and MIPs were obtained and reviewed to evaluate the vascular anatomy.  CONTRAST:  125mL ISOVUE-370 IOPAMIDOL (ISOVUE-370)  INJECTION 76%  COMPARISON:  PET-CT 06/29/2018.  FINDINGS: CTA CHEST FINDINGS  Cardiovascular: Heart size is mildly enlarged with concentric left ventricular hypertrophy. There is no significant pericardial fluid, thickening or pericardial calcification. There is aortic atherosclerosis, as well as atherosclerosis of the great vessels of the mediastinum and the coronary arteries, including calcified atherosclerotic plaque in the left main, left anterior descending, left circumflex and right coronary arteries. Status post median sternotomy for aortic valve replacement with what appears to be a stented bioprosthesis. No evidence of thoracic aortic aneurysm or dissection on today's examination. Left-sided pacemaker device in place with lead tips terminating in the right atrial appendage and near the right ventricular apex.  Mediastinum/Lymph Nodes: No pathologically enlarged mediastinal or hilar lymph nodes. Esophagus is unremarkable in appearance. No axillary lymphadenopathy.  Lungs/Pleura: Stent persistent left lower lobe pulmonary nodule which currently measures 1.4 x 1.0 cm (axial image 64 of series 15). Patchy areas of ground-glass attenuation and septal thickening again noted throughout the lungs bilaterally, most evident throughout the mid to lower lungs where there is also some cylindrical bronchiectasis and thickening of the peribronchovascular interstitium, similar to prior studies. No confluent consolidative airspace disease. No pleural effusions.  Musculoskeletal/Soft Tissues: Median sternotomy wires. There are no aggressive appearing lytic or blastic lesions noted in the visualized portions of the skeleton.  CTA ABDOMEN AND PELVIS FINDINGS  Hepatobiliary: No suspicious cystic or solid hepatic lesions. No intra or extrahepatic biliary ductal dilatation. Gallbladder is normal in appearance.  Pancreas: No pancreatic mass. No pancreatic ductal dilatation.  No pancreatic or peripancreatic fluid or inflammatory changes.  Spleen: Unremarkable.  Adrenals/Urinary Tract: Mild nodular thickening of the adrenal  glands bilaterally, stable compared to prior examinations. Numerous low-attenuation lesions scattered throughout both kidneys, largest of which are compatible with simple cysts which measure up to 3.6 cm. Multiple other subcentimeter low-attenuation lesions in both kidneys are too small to definitively characterize, but are statistically likely to represent tiny cysts. No hydroureteronephrosis. Urinary bladder is normal in appearance.  Stomach/Bowel: Normal appearance of the stomach. No pathologic dilatation of small bowel or colon. Normal appendix.  Vascular/Lymphatic: Aortic atherosclerosis, with vascular findings and measurements pertinent to potential TAVR procedure, as detailed below. No aneurysm or dissection noted in the abdominal or pelvic vasculature. No lymphadenopathy noted in the abdomen or pelvis.  Reproductive: Prostate gland and seminal vesicles are unremarkable in appearance.  Other: No significant volume of ascites.  No pneumoperitoneum.  Musculoskeletal: There are no aggressive appearing lytic or blastic lesions noted in the visualized portions of the skeleton.  VASCULAR MEASUREMENTS PERTINENT TO TAVR:  AORTA:  Minimal Aortic Diameter-16 x 16 mm  Severity of Aortic Calcification-moderate  RIGHT PELVIS:  Right Common Iliac Artery -  Minimal Diameter-11.6 x 10.6 mm  Tortuosity-mild  Calcification - mild  Right External Iliac Artery -  Minimal Diameter-9.4 x 9.1 mm  Tortuosity-severe  Calcification - mild  Right Common Femoral Artery -  Minimal Diameter-10.2 x 9.0 mm  Tortuosity-mild  Calcification - mild  LEFT PELVIS:  Left Common Iliac Artery -  Minimal Diameter-12.1 x 11.0 mm  Tortuosity-mild  Calcification - mild  Left External Iliac Artery  -  Minimal Diameter-8.9 x 8.7 mm  Tortuosity-severe  Calcification - mild  Left Common Femoral Artery -  Minimal Diameter-9.2 x 9.9 mm  Tortuosity-mild  Calcification - mild  Review of the MIP images confirms the above findings.  IMPRESSION: 1. Vascular findings and measurements pertinent to potential transcatheter aortic valve replacement (TAVR) procedure, as detailed above. 2. Status post aortic valve replacement with stented bioprosthesis. 3. Aortic atherosclerosis, in addition to left main and 3 vessel coronary artery disease. 4. Persistent 1.4 x 1.0 cm left lower lobe nodule which remains concerning for neoplasm. Continued attention on follow-up studies is recommended. 5. Chronic changes throughout the lungs suggestive of interstitial lung disease such as nonspecific interstitial pneumonia (NSIP), similar to prior examinations. 6. Additional incidental findings, as above.   Electronically Signed   By: Vinnie Langton M.D.   On: 08/30/2018 16:52   Impression:  Patient has paravalvular leak causing severe hemolysis.  Although the patient claims to remain relatively asymptomatic, he has required numerous blood transfusions over the last several weeks.  I have personally reviewed the patient's most recent transthoracic and transesophageal echocardiograms as well as a CT angiogram performed yesterday.  The images from the transesophageal echocardiogram were somewhat suboptimal as limited views were obtained from the deep transgastric position which is necessary for adequate imaging of the Edwards Intuity valve because of acoustic shadowing caused by the stent frame.  I believe images from the transthoracic echocardiogram are considerably better and convincingly demonstrate the presence of a significant paravalvular leak.  I am not convinced that there is any leaflet dysfunction I do not appreciate any aortic insufficiency that appears to be intra-valvular.   Cardiac gated CT angiogram of the heart demonstrates normal-appearing valve leaflets with no sign of leaflet perforation or tear.  There is no other sign of significant leaflet degeneration.  Although the precise location of the paravalvular leak is not clear, in my opinion it appears to be emanating from the base of the noncoronary sinus of Valsalva.  Options for management include an attempt at catheter-based plugging of the paravalvular leak, redo aortic valve surgery, or long-term palliative medical therapy.     Plan:  I have discussed the results of the patient's numerous tests at length with the patient and his son in the office today.  We discussed at length the potential causes of both paravalvular leak and intra-valvular leak related to leaflet pathology and explained the differences and treatment approaches.  We discussed treatment options for paravalvular leak at length.  We all agreed that it would be best to avoid redo surgery at all costs, and the patient and his son are interested in considering an attempt at catheter-based plugging of his paravalvular leak.  We discussed the nature of this procedure at length including the likely need for general anesthesia to facilitate intraoperative transesophageal echocardiogram as well as fluoroscopy for imaging.  We discussed the possibility that satisfactory plugging of the leak might not be achieved if a wire cannot be successfully passed through the corresponding defect around the valve annulus.  We discussed potential risks of the procedure.  We also discussed the possibility that we might encounter significant leaflet pathology which would preclude plugging as a satisfactory treatment option, although I feel this is very unlikely.  Finally, we discussed the possibility of referral to Dr. Burt Knack for an attempt at catheter-based repair versus a referral to Dr. Sabino Snipes at the [facility] in [place].  We have already  made arrangements to send copies of the patient's recent echocardiograms and CT angiogram to Dr. Albertine Patricia for review. The patient's son plans to discuss matters at length with his other siblings and family members.  I will contact the patient's son as soon as our entire team has had a chance to review all of the imaging studies and discuss options with Dr. Albertine Patricia.    I spent in excess of 30 minutes during the conduct of this office consultation and >50% of this time involved direct face-to-face encounter with the patient for counseling and/or coordination of their care.    Valentina Gu. Roxy Manns, MD 08/31/2018 2:14 PM

## 2018-08-31 NOTE — Telephone Encounter (Signed)
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 Walden Field requesting physician transfer per son/POA. After discussions with both Dr. Walden Field and Dr. Lindi Adie patient will have 2 units PRBC's tomorrow (Dr. Walden Field) and be transferred to Dr. Lindi Adie. Patient will be seen by Dr. Lindi Adie 1/24 for lab/fu @ 11:30 am prior to already scheduled PRBC's (Dr. Lindi Adie). Appointment for lab/fu with Dr. Walden Field 1/23 cancelled.   Spoke with patient's son this morning he is aware.

## 2018-09-01 ENCOUNTER — Inpatient Hospital Stay: Payer: Medicare Other

## 2018-09-01 DIAGNOSIS — I351 Nonrheumatic aortic (valve) insufficiency: Secondary | ICD-10-CM | POA: Diagnosis not present

## 2018-09-01 DIAGNOSIS — D599 Acquired hemolytic anemia, unspecified: Secondary | ICD-10-CM | POA: Diagnosis not present

## 2018-09-01 DIAGNOSIS — D649 Anemia, unspecified: Secondary | ICD-10-CM | POA: Diagnosis not present

## 2018-09-01 DIAGNOSIS — D594 Other nonautoimmune hemolytic anemias: Secondary | ICD-10-CM

## 2018-09-01 DIAGNOSIS — R740 Nonspecific elevation of levels of transaminase and lactic acid dehydrogenase [LDH]: Secondary | ICD-10-CM | POA: Diagnosis not present

## 2018-09-01 MED ORDER — DIPHENHYDRAMINE HCL 25 MG PO CAPS
ORAL_CAPSULE | ORAL | Status: AC
  Filled 2018-09-01: qty 1

## 2018-09-01 MED ORDER — ACETAMINOPHEN 325 MG PO TABS: 650.0000 mg | ORAL_TABLET | Freq: Once | ORAL | Status: DC

## 2018-09-01 MED ORDER — ACETAMINOPHEN 325 MG PO TABS
ORAL_TABLET | ORAL | Status: AC
  Filled 2018-09-01: qty 2

## 2018-09-01 MED ORDER — DIPHENHYDRAMINE HCL 25 MG PO CAPS: 25.0000 mg | ORAL_CAPSULE | Freq: Once | ORAL | Status: DC

## 2018-09-01 MED ORDER — SODIUM CHLORIDE 0.9% IV SOLUTION
250.0000 mL | Freq: Once | INTRAVENOUS | Status: AC
  Administered 2018-09-01: 250 mL via INTRAVENOUS
  Filled 2018-09-01: qty 250

## 2018-09-01 NOTE — Patient Instructions (Signed)
Blood Transfusion, Adult, Care After This sheet gives you information about how to care for yourself after your procedure. Your doctor may also give you more specific instructions. If you have problems or questions, contact your doctor. Follow these instructions at home:   Take over-the-counter and prescription medicines only as told by your doctor.  Go back to your normal activities as told by your doctor.  Follow instructions from your doctor about how to take care of the area where an IV tube was put into your vein (insertion site). Make sure you: ? Wash your hands with soap and water before you change your bandage (dressing). If there is no soap and water, use hand sanitizer. ? Change your bandage as told by your doctor.  Check your IV insertion site every day for signs of infection. Check for: ? More redness, swelling, or pain. ? More fluid or blood. ? Warmth. ? Pus or a bad smell. Contact a doctor if:  You have more redness, swelling, or pain around the IV insertion site.  You have more fluid or blood coming from the IV insertion site.  Your IV insertion site feels warm to the touch.  You have pus or a bad smell coming from the IV insertion site.  Your pee (urine) turns pink, red, or brown.  You feel weak after doing your normal activities. Get help right away if:  You have signs of a serious allergic or body defense (immune) system reaction, including: ? Itchiness. ? Hives. ? Trouble breathing. ? Anxiety. ? Pain in your chest or lower back. ? Fever, flushing, and chills. ? Fast pulse. ? Rash. ? Watery poop (diarrhea). ? Throwing up (vomiting). ? Dark pee. ? Serious headache. ? Dizziness. ? Stiff neck. ? Yellow color in your face or the white parts of your eyes (jaundice). Summary  After a blood transfusion, return to your normal activities as told by your doctor.  Every day, check for signs of infection where the IV tube was put into your vein.  Some  signs of infection are warm skin, more redness and pain, more fluid or blood, and pus or a bad smell where the needle went in.  Contact your doctor if you feel weak or have any unusual symptoms. This information is not intended to replace advice given to you by your health care provider. Make sure you discuss any questions you have with your health care provider. Document Released: 08/22/2014 Document Revised: 03/25/2016 Document Reviewed: 03/25/2016 Elsevier Interactive Patient Education  2019 Elsevier Inc.  

## 2018-09-03 ENCOUNTER — Encounter (HOSPITAL_COMMUNITY): Payer: Medicare Other

## 2018-09-03 LAB — BPAM RBC
BLOOD PRODUCT EXPIRATION DATE: 202002102359
Blood Product Expiration Date: 202002112359
ISSUE DATE / TIME: 202001180949
ISSUE DATE / TIME: 202001180949
Unit Type and Rh: 7300
Unit Type and Rh: 7300

## 2018-09-03 LAB — TYPE AND SCREEN
ABO/RH(D): B POS
Antibody Screen: NEGATIVE
Unit division: 0
Unit division: 0

## 2018-09-05 ENCOUNTER — Encounter (HOSPITAL_COMMUNITY): Payer: Medicare Other

## 2018-09-06 ENCOUNTER — Ambulatory Visit: Payer: Medicare Other | Admitting: Internal Medicine

## 2018-09-06 ENCOUNTER — Other Ambulatory Visit: Payer: Self-pay

## 2018-09-06 ENCOUNTER — Other Ambulatory Visit: Payer: Medicare Other

## 2018-09-06 DIAGNOSIS — D649 Anemia, unspecified: Secondary | ICD-10-CM

## 2018-09-06 DIAGNOSIS — D594 Other nonautoimmune hemolytic anemias: Secondary | ICD-10-CM

## 2018-09-06 MED ORDER — SODIUM CHLORIDE 0.9% FLUSH
10.0000 mL | INTRAVENOUS | Status: DC | PRN
  Filled 2018-09-06: qty 10

## 2018-09-07 ENCOUNTER — Other Ambulatory Visit: Payer: Self-pay

## 2018-09-07 ENCOUNTER — Inpatient Hospital Stay (HOSPITAL_BASED_OUTPATIENT_CLINIC_OR_DEPARTMENT_OTHER): Payer: Medicare Other | Admitting: Hematology and Oncology

## 2018-09-07 ENCOUNTER — Inpatient Hospital Stay: Payer: Medicare Other

## 2018-09-07 ENCOUNTER — Encounter (HOSPITAL_COMMUNITY): Payer: Medicare Other

## 2018-09-07 VITALS — BP 148/59 | HR 60 | Temp 97.8°F | Resp 17 | Ht 67.5 in | Wt 206.2 lb

## 2018-09-07 DIAGNOSIS — D599 Acquired hemolytic anemia, unspecified: Secondary | ICD-10-CM

## 2018-09-07 DIAGNOSIS — N183 Chronic kidney disease, stage 3 unspecified: Secondary | ICD-10-CM

## 2018-09-07 DIAGNOSIS — D649 Anemia, unspecified: Secondary | ICD-10-CM | POA: Diagnosis not present

## 2018-09-07 DIAGNOSIS — D594 Other nonautoimmune hemolytic anemias: Secondary | ICD-10-CM

## 2018-09-07 DIAGNOSIS — I351 Nonrheumatic aortic (valve) insufficiency: Secondary | ICD-10-CM

## 2018-09-07 DIAGNOSIS — D598 Other acquired hemolytic anemias: Secondary | ICD-10-CM

## 2018-09-07 DIAGNOSIS — I38 Endocarditis, valve unspecified: Secondary | ICD-10-CM

## 2018-09-07 DIAGNOSIS — R740 Nonspecific elevation of levels of transaminase and lactic acid dehydrogenase [LDH]: Secondary | ICD-10-CM | POA: Diagnosis not present

## 2018-09-07 LAB — CBC WITH DIFFERENTIAL/PLATELET
ABS IMMATURE GRANULOCYTES: 0.02 10*3/uL (ref 0.00–0.07)
Basophils Absolute: 0 10*3/uL (ref 0.0–0.1)
Basophils Relative: 1 %
Eosinophils Absolute: 0.1 10*3/uL (ref 0.0–0.5)
Eosinophils Relative: 2 %
HCT: 25.9 % — ABNORMAL LOW (ref 39.0–52.0)
Hemoglobin: 8.7 g/dL — ABNORMAL LOW (ref 13.0–17.0)
Immature Granulocytes: 0 %
Lymphocytes Relative: 13 %
Lymphs Abs: 0.7 10*3/uL (ref 0.7–4.0)
MCH: 30.7 pg (ref 26.0–34.0)
MCHC: 33.6 g/dL (ref 30.0–36.0)
MCV: 91.5 fL (ref 80.0–100.0)
Monocytes Absolute: 0.4 10*3/uL (ref 0.1–1.0)
Monocytes Relative: 8 %
Neutro Abs: 4.1 10*3/uL (ref 1.7–7.7)
Neutrophils Relative %: 76 %
Platelets: 139 10*3/uL — ABNORMAL LOW (ref 150–400)
RBC: 2.83 MIL/uL — ABNORMAL LOW (ref 4.22–5.81)
RDW: 14.8 % (ref 11.5–15.5)
WBC: 5.4 10*3/uL (ref 4.0–10.5)
nRBC: 0 % (ref 0.0–0.2)

## 2018-09-07 LAB — COMPREHENSIVE METABOLIC PANEL
ALT: 12 U/L (ref 0–44)
ANION GAP: 9 (ref 5–15)
AST: 44 U/L — ABNORMAL HIGH (ref 15–41)
Albumin: 4.2 g/dL (ref 3.5–5.0)
Alkaline Phosphatase: 57 U/L (ref 38–126)
BUN: 27 mg/dL — ABNORMAL HIGH (ref 8–23)
CO2: 23 mmol/L (ref 22–32)
Calcium: 9.2 mg/dL (ref 8.9–10.3)
Chloride: 109 mmol/L (ref 98–111)
Creatinine, Ser: 2.16 mg/dL — ABNORMAL HIGH (ref 0.61–1.24)
GFR calc non Af Amer: 28 mL/min — ABNORMAL LOW (ref >60)
GFR, EST AFRICAN AMERICAN: 33 mL/min — AB (ref >60)
Glucose, Bld: 141 mg/dL — ABNORMAL HIGH (ref 70–99)
Potassium: 4.2 mmol/L (ref 3.5–5.1)
Sodium: 141 mmol/L (ref 135–145)
Total Bilirubin: 1.5 mg/dL — ABNORMAL HIGH (ref 0.3–1.2)
Total Protein: 7.2 g/dL (ref 6.5–8.1)

## 2018-09-07 LAB — LACTATE DEHYDROGENASE: LDH: 1934 U/L — ABNORMAL HIGH (ref 98–192)

## 2018-09-07 LAB — FERRITIN: Ferritin: 709 ng/mL — ABNORMAL HIGH (ref 24–336)

## 2018-09-07 LAB — PREPARE RBC (CROSSMATCH)

## 2018-09-07 LAB — DIRECT ANTIGLOBULIN TEST (NOT AT ARMC)
DAT, IgG: NEGATIVE
DAT, complement: NEGATIVE

## 2018-09-07 MED ORDER — SODIUM CHLORIDE 0.9% IV SOLUTION
250.0000 mL | Freq: Once | INTRAVENOUS | Status: AC
  Administered 2018-09-07: 250 mL via INTRAVENOUS
  Filled 2018-09-07: qty 250

## 2018-09-07 NOTE — Patient Instructions (Signed)
Blood Transfusion, Adult, Care After This sheet gives you information about how to care for yourself after your procedure. Your doctor may also give you more specific instructions. If you have problems or questions, contact your doctor. Follow these instructions at home:   Take over-the-counter and prescription medicines only as told by your doctor.  Go back to your normal activities as told by your doctor.  Follow instructions from your doctor about how to take care of the area where an IV tube was put into your vein (insertion site). Make sure you: ? Wash your hands with soap and water before you change your bandage (dressing). If there is no soap and water, use hand sanitizer. ? Change your bandage as told by your doctor.  Check your IV insertion site every day for signs of infection. Check for: ? More redness, swelling, or pain. ? More fluid or blood. ? Warmth. ? Pus or a bad smell. Contact a doctor if:  You have more redness, swelling, or pain around the IV insertion site.  You have more fluid or blood coming from the IV insertion site.  Your IV insertion site feels warm to the touch.  You have pus or a bad smell coming from the IV insertion site.  Your pee (urine) turns pink, red, or brown.  You feel weak after doing your normal activities. Get help right away if:  You have signs of a serious allergic or body defense (immune) system reaction, including: ? Itchiness. ? Hives. ? Trouble breathing. ? Anxiety. ? Pain in your chest or lower back. ? Fever, flushing, and chills. ? Fast pulse. ? Rash. ? Watery poop (diarrhea). ? Throwing up (vomiting). ? Dark pee. ? Serious headache. ? Dizziness. ? Stiff neck. ? Yellow color in your face or the white parts of your eyes (jaundice). Summary  After a blood transfusion, return to your normal activities as told by your doctor.  Every day, check for signs of infection where the IV tube was put into your vein.  Some  signs of infection are warm skin, more redness and pain, more fluid or blood, and pus or a bad smell where the needle went in.  Contact your doctor if you feel weak or have any unusual symptoms. This information is not intended to replace advice given to you by your health care provider. Make sure you discuss any questions you have with your health care provider. Document Released: 08/22/2014 Document Revised: 03/25/2016 Document Reviewed: 03/25/2016 Elsevier Interactive Patient Education  2019 Elsevier Inc.  

## 2018-09-07 NOTE — Assessment & Plan Note (Signed)
Acute valve induced hemolytic anemia I reviewed the entire history and blood work. Patient clearly has acute hemolysis with markedly elevated LDH and reduced haptoglobin levels. I do not see direct Coombs test done previously so we will order a DAT today.  Plan: Transfusions on a weekly basis Patient might undergo valve surgery next week. This will resolve his hemolysis problems.  I will see the patient in 2 weeks and reassess our plan. Patient will come weekly for lab check and transfusions. I instructed him to discontinue oral iron therapy.  I discussed with him the risk of iron overload with frequent transfusions. Encouraged him to continue folic acid replacement therapy.

## 2018-09-07 NOTE — Progress Notes (Signed)
Patient Care Team: [name], MD as PCP - General (Internal Medicine) Sherren Mocha, MD as PCP - Cardiology (Cardiology) Constance Haw, MD as PCP - Electrophysiology (Cardiology) Rexene Alberts, MD as Consulting Physician (Cardiothoracic Surgery)  DIAGNOSIS:  Encounter Diagnoses  Name Primary?  . Other acquired hemolytic anemias ([place]) Yes  . Valvular heart disease   . 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  He has required blood transfusions periodically.  He is here accompanied by his son who reports that the patient has significantly slowed down in his mental capacities and has been mostly sleeping and feeling tired all day.  He gets short of breath to minimal exertion.  The valve surgery needs to take place and patient feels that it can happen in the next week or so.  Meanwhile he is here today for blood count check and transfusion as needed.  He was put on oral iron therapy which we will need to discontinue at this time.  He was also taking folic acid which he ran out of at this time.  REVIEW OF SYSTEMS:   Constitutional: Denies fevers, chills or abnormal weight loss Eyes: Denies blurriness of vision Ears, nose, mouth, throat, and face: Denies mucositis or sore throat Respiratory: Shortness of breath Cardiovascular: Shortness of breath to minimal exertion Gastrointestinal:  Denies nausea, heartburn or change in bowel habits Skin: Denies abnormal skin rashes Lymphatics: Denies new lymphadenopathy or easy bruising Neurological:Denies numbness, tingling or new weaknesses Behavioral/Psych: Mood is stable, no new changes  Extremities: No lower extremity edema All other systems were reviewed with the patient and are negative.  I have reviewed the past  medical history, past surgical history, social history and family history with the patient and they are unchanged from previous note.  ALLERGIES:  has No Known Allergies.  MEDICATIONS:  Current Outpatient Medications  Medication Sig Dispense Refill  . amLODipine (NORVASC) 10 MG tablet Take 10 mg by mouth daily.  11  . amoxicillin (AMOXIL) 500 MG tablet Take 4 tablets (2 grams) by mouth 30-60 minutes prior to dental visit. 12 tablet 1  . aspirin EC 81 MG tablet Take 81 mg by mouth daily.    . Capsicum, Cayenne, (CAYENNE PEPPER PO) Take by mouth. Takes 1 tablet po as directed. This is a complex tablet along with ginko biloba    . carvedilol (COREG) 6.25 MG tablet TAKE 1 TABLET BY MOUTH TWICE A DAY 180 tablet 2  . cholecalciferol (VITAMIN D) 1000 units tablet Take 1,000 Units by mouth daily.    . folic acid (FOLVITE) 1 MG tablet Take 1 tablet (1 mg total) by mouth daily. (Patient not taking: Reported on 08/31/2018) 30 tablet 1  . Ginkgo Biloba 40 MG TABS Take by mouth. Unsure dose. Complex tablet along with cayenne pepper. Takes as directed.    . iron polysaccharides (NIFEREX) 150 MG capsule Take 1 capsule (150 mg total) by mouth daily.    Marland Kitchen losartan (COZAAR) 100 MG tablet Take 1 tablet (100 mg total) by mouth daily. 90 tablet 3  . Sage Leaf POWD by Does not apply route as directed. BID    . Tetrahydrozoline HCl (VISINE OP) Apply to eye as directed.     No current facility-administered medications for this visit.    Facility-Administered Medications Ordered in Other Visits  Medication Dose Route Frequency Provider Last Rate Last Dose  . 0.9 %  sodium chloride infusion (Manually program via Guardrails IV Fluids)  250 mL Intravenous Once Nicholas Lose, MD        PHYSICAL EXAMINATION: ECOG PERFORMANCE STATUS: 2 - Symptomatic, <50% confined to bed  Vitals:   09/07/18 1211  BP: (!) 148/59  Pulse: 60  Resp: 17  Temp: 97.8 F (36.6 C)  SpO2: 99%   Filed Weights   09/07/18 1211  Weight:  206 lb 3.2 oz (93.5 kg)    GENERAL:alert, no distress and comfortable SKIN: skin color, texture, turgor are normal, no rashes or significant lesions EYES: normal, Conjunctiva are pink and non-injected, sclera clear OROPHARYNX:no exudate, no erythema and lips, buccal mucosa, and tongue normal  NECK: supple, thyroid normal size, non-tender, without nodularity LYMPH:  no palpable lymphadenopathy in the cervical, axillary or inguinal LUNGS: clear to auscultation and percussion with normal breathing effort HEART: regular rate & rhythm ejection systolic murmur 2 pansystolic murmur in the mitral and aortic areas ABDOMEN:abdomen soft, non-tender and normal bowel sounds MUSCULOSKELETAL:no cyanosis of digits and no clubbing  NEURO: alert & oriented x 3 with fluent speech, no focal motor/sensory deficits EXTREMITIES: No lower extremity edema   LABORATORY DATA:  I have reviewed the data as listed CMP Latest Ref Rng & Units 09/07/2018 08/30/2018 08/23/2018  Glucose 70 - 99 mg/dL 141(H) 99 107(H)  BUN 8 - 23 mg/dL 27(H) 16 17  Creatinine 0.61 - 1.24 mg/dL 2.16(H) 1.54(H) 1.61(H)  Sodium 135 - 145 mmol/L 141 139 141  Potassium 3.5 - 5.1 mmol/L 4.2 4.4 4.3  Chloride 98 - 111 mmol/L 109 108 109  CO2 22 - 32 mmol/L 23 23 23   Calcium 8.9 - 10.3 mg/dL 9.2 9.0 8.9  Total Protein 6.5 - 8.1 g/dL 7.2 6.9 6.9  Total Bilirubin 0.3 - 1.2 mg/dL 1.5(H) 2.0(H) 1.6(H)  Alkaline Phos 38 - 126 U/L 57 61 58  AST 15 - 41 U/L 44(H) 52(H) 55(H)  ALT 0 - 44 U/L 12 12 12     Lab Results  Component Value Date   WBC 5.4 09/07/2018   HGB 8.7 (L) 09/07/2018   HCT 25.9 (L) 09/07/2018   MCV 91.5 09/07/2018   PLT 139 (L) 09/07/2018   NEUTROABS 4.1 09/07/2018    ASSESSMENT & PLAN:  Hemolytic anemia, acute (HCC) Acute valve induced hemolytic anemia I reviewed the entire history and blood work. Patient clearly has acute hemolysis with markedly elevated LDH and reduced haptoglobin levels. I do not see direct Coombs test  done previously so we will order a DAT today.  Plan: Transfusions on a weekly basis Patient might undergo valve surgery next week. This will resolve his hemolysis problems.  I will see the patient in 2 weeks and reassess our plan. Patient will come weekly for lab check and transfusions. I instructed him to discontinue oral iron therapy.  I discussed with him the risk of iron overload with frequent transfusions. Encouraged him to continue folic acid replacement therapy.     Orders Placed This Encounter  Procedures  . Direct antiglobulin test (not at [facility])    Add it to drawn blood    Standing Status:   Future    Number of Occurrences:   1    Standing Expiration Date:   10/12/2019   The patient has a good understanding of the overall plan. he agrees with it. he will call with any problems that may develop before the next  visit here.   Harriette Ohara, MD 09/07/18

## 2018-09-08 LAB — BPAM RBC
Blood Product Expiration Date: 202002142359
Blood Product Expiration Date: 202002192359
ISSUE DATE / TIME: 202001241413
ISSUE DATE / TIME: 202001241413
UNIT TYPE AND RH: 7300
Unit Type and Rh: 7300

## 2018-09-08 LAB — TYPE AND SCREEN
ABO/RH(D): B POS
Antibody Screen: NEGATIVE
Unit division: 0
Unit division: 0

## 2018-09-10 ENCOUNTER — Encounter (HOSPITAL_COMMUNITY): Payer: Medicare Other

## 2018-09-12 ENCOUNTER — Encounter (HOSPITAL_COMMUNITY): Payer: Medicare Other

## 2018-09-13 ENCOUNTER — Other Ambulatory Visit: Payer: Self-pay

## 2018-09-13 ENCOUNTER — Inpatient Hospital Stay: Payer: Medicare Other

## 2018-09-13 DIAGNOSIS — D649 Anemia, unspecified: Secondary | ICD-10-CM | POA: Diagnosis not present

## 2018-09-13 DIAGNOSIS — D599 Acquired hemolytic anemia, unspecified: Secondary | ICD-10-CM | POA: Diagnosis not present

## 2018-09-13 DIAGNOSIS — I351 Nonrheumatic aortic (valve) insufficiency: Secondary | ICD-10-CM | POA: Diagnosis not present

## 2018-09-13 DIAGNOSIS — D598 Other acquired hemolytic anemias: Secondary | ICD-10-CM

## 2018-09-13 DIAGNOSIS — R740 Nonspecific elevation of levels of transaminase and lactic acid dehydrogenase [LDH]: Secondary | ICD-10-CM | POA: Diagnosis not present

## 2018-09-13 LAB — CBC WITH DIFFERENTIAL (CANCER CENTER ONLY)
ABS IMMATURE GRANULOCYTES: 0.02 10*3/uL (ref 0.00–0.07)
Basophils Absolute: 0.1 10*3/uL (ref 0.0–0.1)
Basophils Relative: 1 %
Eosinophils Absolute: 0.1 10*3/uL (ref 0.0–0.5)
Eosinophils Relative: 2 %
HCT: 27 % — ABNORMAL LOW (ref 39.0–52.0)
Hemoglobin: 8.9 g/dL — ABNORMAL LOW (ref 13.0–17.0)
Immature Granulocytes: 0 %
Lymphocytes Relative: 9 %
Lymphs Abs: 0.5 10*3/uL — ABNORMAL LOW (ref 0.7–4.0)
MCH: 29.8 pg (ref 26.0–34.0)
MCHC: 33 g/dL (ref 30.0–36.0)
MCV: 90.3 fL (ref 80.0–100.0)
Monocytes Absolute: 0.6 10*3/uL (ref 0.1–1.0)
Monocytes Relative: 9 %
Neutro Abs: 4.9 10*3/uL (ref 1.7–7.7)
Neutrophils Relative %: 79 %
Platelet Count: 169 10*3/uL (ref 150–400)
RBC: 2.99 MIL/uL — ABNORMAL LOW (ref 4.22–5.81)
RDW: 15.7 % — ABNORMAL HIGH (ref 11.5–15.5)
WBC: 6.1 10*3/uL (ref 4.0–10.5)
nRBC: 0 % (ref 0.0–0.2)

## 2018-09-13 LAB — CMP (CANCER CENTER ONLY)
ALBUMIN: 4.3 g/dL (ref 3.5–5.0)
ALT: 11 U/L (ref 0–44)
AST: 55 U/L — AB (ref 15–41)
Alkaline Phosphatase: 64 U/L (ref 38–126)
Anion gap: 10 (ref 5–15)
BUN: 29 mg/dL — ABNORMAL HIGH (ref 8–23)
CO2: 22 mmol/L (ref 22–32)
Calcium: 9.1 mg/dL (ref 8.9–10.3)
Chloride: 110 mmol/L (ref 98–111)
Creatinine: 1.99 mg/dL — ABNORMAL HIGH (ref 0.61–1.24)
GFR, EST NON AFRICAN AMERICAN: 31 mL/min — AB (ref >60)
GFR, Est AFR Am: 36 mL/min — ABNORMAL LOW (ref >60)
Glucose, Bld: 118 mg/dL — ABNORMAL HIGH (ref 70–99)
POTASSIUM: 4 mmol/L (ref 3.5–5.1)
Sodium: 142 mmol/L (ref 135–145)
TOTAL PROTEIN: 7.5 g/dL (ref 6.5–8.1)
Total Bilirubin: 1.7 mg/dL — ABNORMAL HIGH (ref 0.3–1.2)

## 2018-09-13 LAB — PREPARE RBC (CROSSMATCH)

## 2018-09-13 MED ORDER — SODIUM CHLORIDE 0.9% IV SOLUTION
250.0000 mL | Freq: Once | INTRAVENOUS | Status: AC
  Administered 2018-09-13: 250 mL via INTRAVENOUS
  Filled 2018-09-13: qty 250

## 2018-09-13 NOTE — Patient Instructions (Signed)
Blood Transfusion, Adult, Care After This sheet gives you information about how to care for yourself after your procedure. Your doctor may also give you more specific instructions. If you have problems or questions, contact your doctor. Follow these instructions at home:   Take over-the-counter and prescription medicines only as told by your doctor.  Go back to your normal activities as told by your doctor.  Follow instructions from your doctor about how to take care of the area where an IV tube was put into your vein (insertion site). Make sure you: ? Wash your hands with soap and water before you change your bandage (dressing). If there is no soap and water, use hand sanitizer. ? Change your bandage as told by your doctor.  Check your IV insertion site every day for signs of infection. Check for: ? More redness, swelling, or pain. ? More fluid or blood. ? Warmth. ? Pus or a bad smell. Contact a doctor if:  You have more redness, swelling, or pain around the IV insertion site.  You have more fluid or blood coming from the IV insertion site.  Your IV insertion site feels warm to the touch.  You have pus or a bad smell coming from the IV insertion site.  Your pee (urine) turns pink, red, or brown.  You feel weak after doing your normal activities. Get help right away if:  You have signs of a serious allergic or body defense (immune) system reaction, including: ? Itchiness. ? Hives. ? Trouble breathing. ? Anxiety. ? Pain in your chest or lower back. ? Fever, flushing, and chills. ? Fast pulse. ? Rash. ? Watery poop (diarrhea). ? Throwing up (vomiting). ? Dark pee. ? Serious headache. ? Dizziness. ? Stiff neck. ? Yellow color in your face or the white parts of your eyes (jaundice). Summary  After a blood transfusion, return to your normal activities as told by your doctor.  Every day, check for signs of infection where the IV tube was put into your vein.  Some  signs of infection are warm skin, more redness and pain, more fluid or blood, and pus or a bad smell where the needle went in.  Contact your doctor if you feel weak or have any unusual symptoms. This information is not intended to replace advice given to you by your health care provider. Make sure you discuss any questions you have with your health care provider. Document Released: 08/22/2014 Document Revised: 03/25/2016 Document Reviewed: 03/25/2016 Elsevier Interactive Patient Education  2019 Elsevier Inc.  

## 2018-09-13 NOTE — Progress Notes (Signed)
Per Dr.Gudena, hg 8.9. Pt to receive 1 unit prbc today. Blood bank notified and infusion RN aware.

## 2018-09-14 ENCOUNTER — Encounter (HOSPITAL_COMMUNITY): Payer: Medicare Other

## 2018-09-14 LAB — BPAM RBC
Blood Product Expiration Date: 202002242359
ISSUE DATE / TIME: 202001301355
UNIT TYPE AND RH: 7300

## 2018-09-14 LAB — TYPE AND SCREEN
ABO/RH(D): B POS
Antibody Screen: NEGATIVE
Unit division: 0

## 2018-09-14 LAB — SAMPLE TO BLOOD BANK

## 2018-09-17 ENCOUNTER — Encounter (HOSPITAL_COMMUNITY): Payer: Medicare Other

## 2018-09-19 ENCOUNTER — Encounter (HOSPITAL_COMMUNITY): Payer: Medicare Other

## 2018-09-19 NOTE — Progress Notes (Signed)
Patient Care Team: [name], MD as PCP - General (Internal Medicine) Sherren Mocha, MD as PCP - Cardiology (Cardiology) Constance Haw, MD as PCP - Electrophysiology (Cardiology) Rexene Alberts, MD as Consulting Physician (Cardiothoracic Surgery)  DIAGNOSIS:    ICD-10-CM   1. Iron overload due to repeated red blood cell transfusions E83.111 Ferritin    Iron and TIBC    CBC with Differential (Cancer Center Only)    Sample to Blood Bank  2. Hemolytic anemia, acute (HCC) D59.9 Sample to Blood Bank    CHIEF COMPLIANT: Severe valve induced hemolytic anemia  INTERVAL HISTORY: James Curtis is a 78 y.o. 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 He presents to the clinic today with his son, who is his caregiver and primary historian. He contacted cardiologist, Dr. Halford Chessman, last week about the valve surgery and was told lab work and scans were being reviewed but has not heard back since. He reports memory issues, but denies SOB. His son reports he has not been taking his medicine correctly due to memory loss. His lab work from today shows: WBC 5.5, Hg 8.3, platelets 140. His son asked about alterations he can make to his diet. His son reviewed his medication list with me and noted he is taking Prevagen for memory loss and stopped taking oral iron.   REVIEW OF SYSTEMS:   Constitutional: Denies fevers, chills or abnormal weight loss Eyes: Denies blurriness of vision Ears, nose, mouth, throat, and face: Denies mucositis or sore throat Respiratory: Denies cough, dyspnea or wheezes Cardiovascular: Denies palpitation, chest discomfort Gastrointestinal: Denies nausea, heartburn or change in bowel habits Skin: Denies abnormal skin rashes Lymphatics: Denies new lymphadenopathy or easy bruising Neurological: Denies numbness, tingling or new weaknesses Behavioral/Psych: Mood  is stable, no new changes (+) memory issues Extremities: No lower extremity edema All other systems were reviewed with the patient and are negative.  I have reviewed the past medical history, past surgical history, social history and family history with the patient and they are unchanged from previous note.  ALLERGIES:  has No Known Allergies.  MEDICATIONS:  Current Outpatient Medications  Medication Sig Dispense Refill  . amLODipine (NORVASC) 10 MG tablet Take 10 mg by mouth daily.  11  . amoxicillin (AMOXIL) 500 MG tablet Take 4 tablets (2 grams) by mouth 30-60 minutes prior to dental visit. 12 tablet 1  . Apoaequorin (PREVAGEN EXTRA STRENGTH) 20 MG CAPS Take 1 tablet by mouth daily.    Marland Kitchen aspirin EC 81 MG tablet Take 81 mg by mouth daily.    . Capsicum, Cayenne, (CAYENNE PEPPER PO) Take by mouth. Takes 1 tablet po as directed. This is a complex tablet along with ginko biloba    . carvedilol (COREG) 6.25 MG tablet TAKE 1 TABLET BY MOUTH TWICE A DAY 180 tablet 2  . cholecalciferol (VITAMIN D) 1000 units tablet Take 1,000 Units by mouth daily.    . folic acid (FOLVITE) 1 MG tablet Take 3 tablets (3 mg total) by mouth daily. 90 tablet 3  . Ginkgo Biloba 40 MG TABS Take by mouth. Unsure dose. Complex tablet along with cayenne pepper. Takes as directed.    . iron polysaccharides (NIFEREX) 150 MG capsule Take 1 capsule (150 mg total) by mouth daily.    Marland Kitchen losartan (COZAAR) 100 MG tablet Take 1 tablet (100 mg total) by mouth daily. 90 tablet 3  . Sage Leaf POWD by Does  not apply route as directed. BID    . Tetrahydrozoline HCl (VISINE OP) Apply to eye as directed.     No current facility-administered medications for this visit.     PHYSICAL EXAMINATION: ECOG PERFORMANCE STATUS: 2 - Symptomatic, <50% confined to bed  Vitals:   09/20/18 0918  BP: (!) 124/99  Pulse: 60  Resp: 17  Temp: 98.1 F (36.7 C)  SpO2: 100%   Filed Weights   09/20/18 0918  Weight: 202 lb 3.2 oz (91.7 kg)     GENERAL: alert, no distress and comfortable SKIN: skin color, texture, turgor are normal, no rashes or significant lesions EYES: normal, Conjunctiva are pink and non-injected, sclera clear OROPHARYNX: no exudate, no erythema and lips, buccal mucosa, and tongue normal  NECK: supple, thyroid normal size, non-tender, without nodularity LYMPH: no palpable lymphadenopathy in the cervical, axillary or inguinal LUNGS: clear to auscultation and percussion with normal breathing effort HEART: regular rate & rhythm and no murmurs and no lower extremity edema ABDOMEN: abdomen soft, non-tender and normal bowel sounds MUSCULOSKELETAL: no cyanosis of digits and no clubbing  NEURO: alert & oriented x 3 with fluent speech, no focal motor/sensory deficits EXTREMITIES: No lower extremity edema  LABORATORY DATA:  I have reviewed the data as listed CMP Latest Ref Rng & Units 09/20/2018 09/13/2018 09/07/2018  Glucose 70 - 99 mg/dL 111(H) 118(H) 141(H)  BUN 8 - 23 mg/dL 21 29(H) 27(H)  Creatinine 0.61 - 1.24 mg/dL 2.01(H) 1.99(H) 2.16(H)  Sodium 135 - 145 mmol/L 141 142 141  Potassium 3.5 - 5.1 mmol/L 4.1 4.0 4.2  Chloride 98 - 111 mmol/L 110 110 109  CO2 22 - 32 mmol/L 22 22 23   Calcium 8.9 - 10.3 mg/dL 9.0 9.1 9.2  Total Protein 6.5 - 8.1 g/dL 6.8 7.5 7.2  Total Bilirubin 0.3 - 1.2 mg/dL 1.3(H) 1.7(H) 1.5(H)  Alkaline Phos 38 - 126 U/L 60 64 57  AST 15 - 41 U/L 34 55(H) 44(H)  ALT 0 - 44 U/L 12 11 12     Lab Results  Component Value Date   WBC 5.5 09/20/2018   HGB 8.3 (L) 09/20/2018   HCT 24.7 (L) 09/20/2018   MCV 90.1 09/20/2018   PLT 140 (L) 09/20/2018   NEUTROABS 4.4 09/20/2018    ASSESSMENT & PLAN:  Hemolytic anemia, acute (HCC) Acute valve induced hemolytic anemia (DAT Neg) Lab review: Hemoglobin 8.9 on 09/13/2018 Blood transfusions: 1/18, 1/24, 1/20, 2/6  I will see the patient in 4 weeks and reassess our plan. Patient will come weekly for lab check and transfusions  I instructed  him to discontinue oral iron therapy.   Continue with folic acid replacement therapy    Orders Placed This Encounter  Procedures  . Ferritin    Standing Status:   Future    Standing Expiration Date:   09/20/2019  . Iron and TIBC    Standing Status:   Future    Standing Expiration Date:   09/20/2019  . CBC with Differential (Cancer Center Only)    Standing Status:   Future    Standing Expiration Date:   09/21/2019  . Sample to Blood Bank    Standing Status:   Standing    Number of Occurrences:   10    Standing Expiration Date:   09/21/2019   The patient has a good understanding of the overall plan. he agrees with it. he will call with any problems that may develop before the next visit here.  Nicholas Lose,  MD 09/20/2018  [name] am acting as scribe for Dr. Nicholas Lose.  I have reviewed the above documentation for accuracy and completeness, and I agree with the above.

## 2018-09-20 ENCOUNTER — Inpatient Hospital Stay: Payer: Medicare Other | Attending: Oncology

## 2018-09-20 ENCOUNTER — Inpatient Hospital Stay (HOSPITAL_BASED_OUTPATIENT_CLINIC_OR_DEPARTMENT_OTHER): Payer: Medicare Other | Admitting: Hematology and Oncology

## 2018-09-20 ENCOUNTER — Other Ambulatory Visit: Payer: Medicare Other

## 2018-09-20 ENCOUNTER — Telehealth: Payer: Self-pay | Admitting: Thoracic Surgery (Cardiothoracic Vascular Surgery)

## 2018-09-20 ENCOUNTER — Inpatient Hospital Stay: Payer: Medicare Other

## 2018-09-20 ENCOUNTER — Encounter: Payer: Self-pay | Admitting: Thoracic Surgery (Cardiothoracic Vascular Surgery)

## 2018-09-20 ENCOUNTER — Other Ambulatory Visit: Payer: Self-pay

## 2018-09-20 DIAGNOSIS — D599 Acquired hemolytic anemia, unspecified: Secondary | ICD-10-CM

## 2018-09-20 DIAGNOSIS — D649 Anemia, unspecified: Secondary | ICD-10-CM | POA: Insufficient documentation

## 2018-09-20 DIAGNOSIS — I351 Nonrheumatic aortic (valve) insufficiency: Secondary | ICD-10-CM | POA: Insufficient documentation

## 2018-09-20 LAB — CBC WITH DIFFERENTIAL (CANCER CENTER ONLY)
Abs Immature Granulocytes: 0.01 10*3/uL (ref 0.00–0.07)
BASOS PCT: 1 %
Basophils Absolute: 0 10*3/uL (ref 0.0–0.1)
EOS ABS: 0.1 10*3/uL (ref 0.0–0.5)
Eosinophils Relative: 2 %
HCT: 24.7 % — ABNORMAL LOW (ref 39.0–52.0)
Hemoglobin: 8.3 g/dL — ABNORMAL LOW (ref 13.0–17.0)
Immature Granulocytes: 0 %
LYMPHS ABS: 0.5 10*3/uL — AB (ref 0.7–4.0)
Lymphocytes Relative: 9 %
MCH: 30.3 pg (ref 26.0–34.0)
MCHC: 33.6 g/dL (ref 30.0–36.0)
MCV: 90.1 fL (ref 80.0–100.0)
Monocytes Absolute: 0.5 10*3/uL (ref 0.1–1.0)
Monocytes Relative: 9 %
Neutro Abs: 4.4 10*3/uL (ref 1.7–7.7)
Neutrophils Relative %: 79 %
Platelet Count: 140 10*3/uL — ABNORMAL LOW (ref 150–400)
RBC: 2.74 MIL/uL — ABNORMAL LOW (ref 4.22–5.81)
RDW: 14.9 % (ref 11.5–15.5)
WBC Count: 5.5 10*3/uL (ref 4.0–10.5)
nRBC: 0 % (ref 0.0–0.2)

## 2018-09-20 LAB — CMP (CANCER CENTER ONLY)
ALT: 12 U/L (ref 0–44)
AST: 34 U/L (ref 15–41)
Albumin: 4.1 g/dL (ref 3.5–5.0)
Alkaline Phosphatase: 60 U/L (ref 38–126)
Anion gap: 9 (ref 5–15)
BUN: 21 mg/dL (ref 8–23)
CHLORIDE: 110 mmol/L (ref 98–111)
CO2: 22 mmol/L (ref 22–32)
Calcium: 9 mg/dL (ref 8.9–10.3)
Creatinine: 2.01 mg/dL — ABNORMAL HIGH (ref 0.61–1.24)
GFR, Est AFR Am: 36 mL/min — ABNORMAL LOW (ref >60)
GFR, Estimated: 31 mL/min — ABNORMAL LOW (ref >60)
Glucose, Bld: 111 mg/dL — ABNORMAL HIGH (ref 70–99)
Potassium: 4.1 mmol/L (ref 3.5–5.1)
Sodium: 141 mmol/L (ref 135–145)
Total Bilirubin: 1.3 mg/dL — ABNORMAL HIGH (ref 0.3–1.2)
Total Protein: 6.8 g/dL (ref 6.5–8.1)

## 2018-09-20 LAB — SAMPLE TO BLOOD BANK

## 2018-09-20 LAB — PREPARE RBC (CROSSMATCH)

## 2018-09-20 MED ORDER — SODIUM CHLORIDE 0.9% IV SOLUTION
250.0000 mL | Freq: Once | INTRAVENOUS | Status: AC
  Administered 2018-09-20: 250 mL via INTRAVENOUS
  Filled 2018-09-20: qty 250

## 2018-09-20 MED ORDER — APOAEQUORIN 20 MG PO CAPS: 1.0000 | ORAL_CAPSULE | Freq: Every day | ORAL | Status: DC

## 2018-09-20 MED ORDER — FOLIC ACID 1 MG PO TABS: 3.0000 mg | ORAL_TABLET | Freq: Every day | ORAL | 3 refills | Status: DC

## 2018-09-20 NOTE — Progress Notes (Signed)
Per Dr.Gudena, pt to receive 2units prbc today for hg 8.3. Pt may get 1unit today and 1 unit on Saturday, if infusion unable to do both. Notified infusion charge nurse and blood bank.

## 2018-09-20 NOTE — Assessment & Plan Note (Signed)
Acute valve induced hemolytic anemia (DAT Neg) Lab review: Hemoglobin 8.9 on 09/13/2018 Blood transfusions: 1/18, 1/24, 1/20 I will see the patient in 2 weeks and reassess our plan. Patient will come weekly for lab check and transfusions. I instructed him to discontinue oral iron therapy.  Continue with folic acid replacement therapy

## 2018-09-20 NOTE — Telephone Encounter (Signed)
Reviewed patient's multiple echocardiograms and CT angiogram with a multidisciplinary team including consultation with Dr. Sabino Snipes and his team in [place].  We all feel that there is a good possibility that the patient's paravalvular leak potentially could be repaired via catheter-based approach.  It is possible that simple high-pressure balloon valvuloplasty of the stent could further expand the stent and potentially alleviate the leak.  If this does not work then an attempt at plugging the leak appears to be feasible.  I discussed our recommendations over the telephone with patient's son [name].  They desire to see Dr. Albertine Patricia for consultation and possible interventional repair.  We will make arrangements as soon as practical.  Rexene Alberts, MD 09/20/2018 4:40 PM

## 2018-09-21 ENCOUNTER — Telehealth: Payer: Self-pay | Admitting: Cardiovascular Disease

## 2018-09-21 ENCOUNTER — Encounter (HOSPITAL_COMMUNITY): Payer: Medicare Other

## 2018-09-21 LAB — TYPE AND SCREEN
ABO/RH(D): B POS
Antibody Screen: NEGATIVE
UNIT DIVISION: 0
Unit division: 0

## 2018-09-21 LAB — BPAM RBC
Blood Product Expiration Date: 202002282359
Blood Product Expiration Date: 202002282359
ISSUE DATE / TIME: 202002061044
ISSUE DATE / TIME: 202002061044
Unit Type and Rh: 7300
Unit Type and Rh: 7300

## 2018-09-21 NOTE — Telephone Encounter (Signed)
Information forwarded to [facility] at [facility].

## 2018-09-21 NOTE — Telephone Encounter (Signed)
New message    James Curtis from  [facility]  Stated that she never got pt demographics . She is trying to contact him regarding a referral that we sent . Please fax to [phone]

## 2018-09-24 ENCOUNTER — Encounter (HOSPITAL_COMMUNITY): Payer: Medicare Other

## 2018-09-24 DIAGNOSIS — T8203XD Leakage of heart valve prosthesis, subsequent encounter: Secondary | ICD-10-CM | POA: Diagnosis not present

## 2018-09-24 DIAGNOSIS — R413 Other amnesia: Secondary | ICD-10-CM | POA: Diagnosis not present

## 2018-09-24 DIAGNOSIS — I10 Essential (primary) hypertension: Secondary | ICD-10-CM | POA: Diagnosis not present

## 2018-09-24 DIAGNOSIS — D598 Other acquired hemolytic anemias: Secondary | ICD-10-CM | POA: Diagnosis not present

## 2018-09-26 ENCOUNTER — Telehealth: Payer: Self-pay | Admitting: Cardiology

## 2018-09-26 ENCOUNTER — Encounter (HOSPITAL_COMMUNITY): Payer: Medicare Other

## 2018-09-26 ENCOUNTER — Other Ambulatory Visit: Payer: Self-pay | Admitting: Hematology and Oncology

## 2018-09-26 DIAGNOSIS — R9431 Abnormal electrocardiogram [ECG] [EKG]: Secondary | ICD-10-CM | POA: Diagnosis not present

## 2018-09-26 DIAGNOSIS — I5032 Chronic diastolic (congestive) heart failure: Secondary | ICD-10-CM | POA: Diagnosis not present

## 2018-09-26 DIAGNOSIS — E669 Obesity, unspecified: Secondary | ICD-10-CM | POA: Diagnosis not present

## 2018-09-26 DIAGNOSIS — Z952 Presence of prosthetic heart valve: Secondary | ICD-10-CM | POA: Diagnosis not present

## 2018-09-26 DIAGNOSIS — Z6832 Body mass index (BMI) 32.0-32.9, adult: Secondary | ICD-10-CM | POA: Diagnosis not present

## 2018-09-26 DIAGNOSIS — Z87891 Personal history of nicotine dependence: Secondary | ICD-10-CM | POA: Diagnosis not present

## 2018-09-26 DIAGNOSIS — I350 Nonrheumatic aortic (valve) stenosis: Secondary | ICD-10-CM | POA: Diagnosis not present

## 2018-09-26 DIAGNOSIS — D589 Hereditary hemolytic anemia, unspecified: Secondary | ICD-10-CM | POA: Diagnosis not present

## 2018-09-26 DIAGNOSIS — Z8673 Personal history of transient ischemic attack (TIA), and cerebral infarction without residual deficits: Secondary | ICD-10-CM | POA: Diagnosis not present

## 2018-09-26 DIAGNOSIS — I130 Hypertensive heart and chronic kidney disease with heart failure and stage 1 through stage 4 chronic kidney disease, or unspecified chronic kidney disease: Secondary | ICD-10-CM | POA: Diagnosis not present

## 2018-09-26 DIAGNOSIS — E1122 Type 2 diabetes mellitus with diabetic chronic kidney disease: Secondary | ICD-10-CM | POA: Diagnosis not present

## 2018-09-26 DIAGNOSIS — I272 Pulmonary hypertension, unspecified: Secondary | ICD-10-CM | POA: Diagnosis not present

## 2018-09-26 DIAGNOSIS — T8203XA Leakage of heart valve prosthesis, initial encounter: Secondary | ICD-10-CM | POA: Diagnosis not present

## 2018-09-26 DIAGNOSIS — Z79899 Other long term (current) drug therapy: Secondary | ICD-10-CM | POA: Diagnosis not present

## 2018-09-26 DIAGNOSIS — I442 Atrioventricular block, complete: Secondary | ICD-10-CM | POA: Diagnosis not present

## 2018-09-26 DIAGNOSIS — Z950 Presence of cardiac pacemaker: Secondary | ICD-10-CM | POA: Diagnosis not present

## 2018-09-26 DIAGNOSIS — E785 Hyperlipidemia, unspecified: Secondary | ICD-10-CM | POA: Diagnosis not present

## 2018-09-26 DIAGNOSIS — F0150 Vascular dementia without behavioral disturbance: Secondary | ICD-10-CM | POA: Diagnosis not present

## 2018-09-26 DIAGNOSIS — Z7982 Long term (current) use of aspirin: Secondary | ICD-10-CM | POA: Diagnosis not present

## 2018-09-26 DIAGNOSIS — N189 Chronic kidney disease, unspecified: Secondary | ICD-10-CM | POA: Diagnosis not present

## 2018-09-26 NOTE — Telephone Encounter (Signed)
I gave the patient son a call back and he already figured out that his father has a [employer] device.

## 2018-09-26 NOTE — Telephone Encounter (Signed)
New message   Patient's son is needing information about the patient's device. Please advise.

## 2018-09-27 ENCOUNTER — Inpatient Hospital Stay: Payer: Medicare Other

## 2018-09-27 DIAGNOSIS — I130 Hypertensive heart and chronic kidney disease with heart failure and stage 1 through stage 4 chronic kidney disease, or unspecified chronic kidney disease: Secondary | ICD-10-CM | POA: Diagnosis not present

## 2018-09-27 DIAGNOSIS — N189 Chronic kidney disease, unspecified: Secondary | ICD-10-CM | POA: Diagnosis not present

## 2018-09-27 DIAGNOSIS — I350 Nonrheumatic aortic (valve) stenosis: Secondary | ICD-10-CM | POA: Diagnosis not present

## 2018-09-27 DIAGNOSIS — T8203XA Leakage of heart valve prosthesis, initial encounter: Secondary | ICD-10-CM | POA: Diagnosis not present

## 2018-09-27 DIAGNOSIS — Z9889 Other specified postprocedural states: Secondary | ICD-10-CM | POA: Diagnosis not present

## 2018-09-27 DIAGNOSIS — R9431 Abnormal electrocardiogram [ECG] [EKG]: Secondary | ICD-10-CM | POA: Diagnosis not present

## 2018-09-27 DIAGNOSIS — E1122 Type 2 diabetes mellitus with diabetic chronic kidney disease: Secondary | ICD-10-CM | POA: Diagnosis not present

## 2018-09-27 DIAGNOSIS — I5032 Chronic diastolic (congestive) heart failure: Secondary | ICD-10-CM | POA: Diagnosis not present

## 2018-09-27 DIAGNOSIS — I442 Atrioventricular block, complete: Secondary | ICD-10-CM | POA: Diagnosis not present

## 2018-09-27 DIAGNOSIS — Z952 Presence of prosthetic heart valve: Secondary | ICD-10-CM | POA: Diagnosis not present

## 2018-09-28 ENCOUNTER — Encounter (HOSPITAL_COMMUNITY): Payer: Medicare Other

## 2018-10-01 ENCOUNTER — Encounter (HOSPITAL_COMMUNITY): Payer: Medicare Other

## 2018-10-03 ENCOUNTER — Encounter (HOSPITAL_COMMUNITY): Payer: Medicare Other

## 2018-10-04 ENCOUNTER — Inpatient Hospital Stay: Payer: Medicare Other

## 2018-10-04 ENCOUNTER — Telehealth: Payer: Self-pay

## 2018-10-04 ENCOUNTER — Telehealth: Payer: Self-pay | Admitting: Hematology and Oncology

## 2018-10-04 DIAGNOSIS — D649 Anemia, unspecified: Secondary | ICD-10-CM

## 2018-10-04 DIAGNOSIS — D599 Acquired hemolytic anemia, unspecified: Secondary | ICD-10-CM | POA: Diagnosis not present

## 2018-10-04 DIAGNOSIS — I351 Nonrheumatic aortic (valve) insufficiency: Secondary | ICD-10-CM | POA: Diagnosis not present

## 2018-10-04 LAB — SAMPLE TO BLOOD BANK

## 2018-10-04 LAB — CBC WITH DIFFERENTIAL (CANCER CENTER ONLY)
Abs Immature Granulocytes: 0.01 10*3/uL (ref 0.00–0.07)
Basophils Absolute: 0 10*3/uL (ref 0.0–0.1)
Basophils Relative: 1 %
Eosinophils Absolute: 0.1 10*3/uL (ref 0.0–0.5)
Eosinophils Relative: 3 %
HEMATOCRIT: 26.5 % — AB (ref 39.0–52.0)
Hemoglobin: 8.7 g/dL — ABNORMAL LOW (ref 13.0–17.0)
Immature Granulocytes: 0 %
Lymphocytes Relative: 12 %
Lymphs Abs: 0.6 10*3/uL — ABNORMAL LOW (ref 0.7–4.0)
MCH: 29.8 pg (ref 26.0–34.0)
MCHC: 32.8 g/dL (ref 30.0–36.0)
MCV: 90.8 fL (ref 80.0–100.0)
Monocytes Absolute: 0.4 10*3/uL (ref 0.1–1.0)
Monocytes Relative: 9 %
Neutro Abs: 3.7 10*3/uL (ref 1.7–7.7)
Neutrophils Relative %: 75 %
Platelet Count: 159 10*3/uL (ref 150–400)
RBC: 2.92 MIL/uL — ABNORMAL LOW (ref 4.22–5.81)
RDW: 14.6 % (ref 11.5–15.5)
WBC Count: 5 10*3/uL (ref 4.0–10.5)
nRBC: 0 % (ref 0.0–0.2)

## 2018-10-04 LAB — CMP (CANCER CENTER ONLY)
ALK PHOS: 68 U/L (ref 38–126)
ALT: <6 U/L (ref 0–44)
ANION GAP: 8 (ref 5–15)
AST: 16 U/L (ref 15–41)
Albumin: 4.2 g/dL (ref 3.5–5.0)
BUN: 14 mg/dL (ref 8–23)
CO2: 24 mmol/L (ref 22–32)
Calcium: 9.1 mg/dL (ref 8.9–10.3)
Chloride: 108 mmol/L (ref 98–111)
Creatinine: 1.96 mg/dL — ABNORMAL HIGH (ref 0.61–1.24)
GFR, Est AFR Am: 37 mL/min — ABNORMAL LOW (ref >60)
GFR, Estimated: 32 mL/min — ABNORMAL LOW (ref >60)
GLUCOSE: 91 mg/dL (ref 70–99)
Potassium: 4.6 mmol/L (ref 3.5–5.1)
SODIUM: 140 mmol/L (ref 135–145)
Total Bilirubin: 0.8 mg/dL (ref 0.3–1.2)
Total Protein: 7 g/dL (ref 6.5–8.1)

## 2018-10-04 LAB — IRON AND TIBC
Iron: 83 ug/dL (ref 42–163)
Saturation Ratios: 37 % (ref 20–55)
TIBC: 225 ug/dL (ref 202–409)
UIBC: 142 ug/dL (ref 117–376)

## 2018-10-04 LAB — FERRITIN: Ferritin: 970 ng/mL — ABNORMAL HIGH (ref 24–336)

## 2018-10-04 NOTE — Telephone Encounter (Signed)
Pt son called to see if Dr.Gudena needs to transfuse pt with prbc. Pt had valve surgery last wed and is doing very well. Fe in the 900's and hg 8.7. Per Dr.Gudena lab review, ok to hold of on transfusion for this week and pt to come back for lab check next Thursday. Ronalee Belts (son) aware and verbalized understanding. Confirmed time/date for lab appt next week. No further needs at this time.

## 2018-10-04 NOTE — Telephone Encounter (Signed)
Scheduled appt per 2/20 sch message - left message for son with appt date and time

## 2018-10-05 ENCOUNTER — Encounter (HOSPITAL_COMMUNITY): Payer: Medicare Other

## 2018-10-08 ENCOUNTER — Encounter (HOSPITAL_COMMUNITY): Payer: Medicare Other

## 2018-10-09 NOTE — Progress Notes (Signed)
Cardiology Office Note    Date:  10/10/2018   ID:  James Curtis, DOB 05/26/1941, MRN [id number]  PCP:  [name], MD  Cardiologist:  Sherren Mocha, MD   Electrophysiologist:  Constance Haw, MD   Chief Complaint: Hospital follow up  History of Present Illness:   James Curtis is a 78 y.o. 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   Echo in 9/19 did demonstrate mild to mod perivalvular aortic regurgitation. Dr. Burt Knack planned for a repeat echo in 6 mos.  ince then, he has been noted to be anemic and has been evaluated by Hematology (Dr. Audelia Hives).  His anemia is suggestive of a hemolytic process likely related to his malfunctioning aortic valve. He underwent transfusion with PRBCs twice in 07/2018.  Followed up TEE 08/22/18 showed LVEF of 55-60%,  suggest intravalvular leak, rather than perivalvular leak of bioprosthetic valve. Mild MR.      Patient had valve repaired at [facility], [place], [place] 2 weeks ago.  Observe overnight and discharge.  He has no complaints.  Noted knot at cath site.  Not painful.  Denies palpitation, orthopnea, PND melena, chest pain, shortness or syncope.   Past Medical History:  Diagnosis Date  . Aortic stenosis    a. 11/20/2016 Echo: mod AS;  b. 11/23/2016 TEE restricted mobility of noncoronary cusp;  c. 11/2016 Cath: mean grad 41mHg w/ dual lumen catheter, AVA 2.0cm^2; mean gradient by pullback = 163mg, AVA 2.3cm^2.  . Cardiomyopathy ([facility]   Echo 6/18: EF 40-45, inf/inf-septal HK-AK, Gr 1 DD, severe LVH, MAC, severe LAE, mildly reduced RVSF  . Carotid arterial disease ([facility]   a. 11/2016 Carotid U/S: bilat 1-39% ICA stenosis.  .  Chronic kidney disease   . Complete heart block ([facility]   a. 11/21/2016 s/p MDT AzSantina EvansR MRI SureScan (ser # [id number]).  . Dementia ([facility] [place]   mild  . Family history of adverse reaction to anesthesia    Son [name] a difficult time waking up  . Heart murmur   . Hemolytic anemia ([facility] [place]  . Hypercholesteremia   . Hypertension    benign  . Incidental pulmonary nodule, greater than or equal to 52m39m needs f/u scan 3 months 11/28/2016   14 x 8 mm (mean diameter of 11 mm) left lower lobe pulmonary nodule with some surrounding ground-glass attenuation (axial image 42 of series 6). 9 x 5 mm (mean diameter of 7 mm) subpleural nodule in the periphery of the right upper lobe (axial image 20 of series 6). A few patchy areas of peripheral predominant ground-glass attenuation and subpleural reticulation are noted, scattered randomly throu  . Left atrial mass   . 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 11/2009 TEE: EF 60-65%, restricted mobility of noncoronary AoV cusp, 16x4 mm mobile Ca2+ echodensity on the atrial basal surface of the posterior MV leaflet, mild MR, no LA/LAA thrombus.  . Non-obstructive CAD (coronary artery disease)    a. 11/2016 Cath: LM large, nl, LAD min irregs, LCX large, min irregs, OM3 40, RCA large, 98m34m. Paravalvular leak (prosthetic valve)    Echo 9/19: Severe concentric LVH, EF 55-60, normal wall motion, grade 2 diastolic dysfunction, bioprosthetic AVR with mild to moderate perivalvular regurgitation, moderate to  severely calcified mitral valve annulus, severe LAE, mild to moderate TR, PASP 42  . Paravalvular leak (prosthetic valve), subsequent encounter   . Pre-diabetes   . Prostate cancer [facility])    s/p radiation  . Renal mass of unknown nature - needs f/u MRI 11/28/2016   Multiple renal lesions are noted in the kidneys bilaterally. Although several of these lesions are low-attenuation, compatible with cysts, other lesions are intermediate to high attenuation,  potentially enhancing  . S/P aortic valve replacement with bioprosthetic valve 12/02/2016   23 mm Edwards Intuity rapid deployment bovine pericardial tissue valve  . S/P resection of left atrial mass 12/02/2016   Benign dystrophic fibrotic tissue with calcification attached to posterior mitral annulus by thin fibrotic stalk  . Stroke ([place])   . TIA (transient ischemic attack)    a. 11/2016 in setting of complete heart block.    Past Surgical History:  Procedure Laterality Date  . AORTIC VALVE REPLACEMENT N/A 12/02/2016   Procedure: AORTIC VALVE REPLACEMENT (AVR) using a 23 Edwards Intuity Elite Aortic Valve;  Surgeon: Rexene Alberts, MD;  Location: [place];  Service: Open Heart Surgery;  Laterality: N/A;  . EXCISION OF ATRIAL MYXOMA N/A 12/02/2016   Procedure: Resection of left atrial mass ;  Surgeon: Rexene Alberts, MD;  Location: [place];  Service: Open Heart Surgery;  Laterality: N/A;  . PACEMAKER IMPLANT N/A 11/21/2016   Procedure: Pacemaker Implant;  Surgeon: Will [name], MD;  Location: [place] CV LAB;  Service: Cardiovascular;  Laterality: N/A;  . RIGHT/LEFT HEART CATH AND CORONARY ANGIOGRAPHY N/A 11/25/2016   Procedure: Right/Left Heart Cath and Coronary Angiography;  Surgeon: Nelva Bush, MD;  Location: [place] CV LAB;  Service: Cardiovascular;  Laterality: N/A;  . TEE WITHOUT CARDIOVERSION N/A 11/23/2016   Procedure: TRANSESOPHAGEAL ECHOCARDIOGRAM (TEE);  Surgeon: Jerline Pain, MD;  Location: [place];  Service: Cardiovascular;  Laterality: N/A;  . TEE WITHOUT CARDIOVERSION N/A 12/02/2016   Procedure: TRANSESOPHAGEAL ECHOCARDIOGRAM (TEE);  Surgeon: Rexene Alberts, MD;  Location: [place];  Service: Open Heart Surgery;  Laterality: N/A;  . TEE WITHOUT CARDIOVERSION N/A 08/22/2018   Procedure: TRANSESOPHAGEAL ECHOCARDIOGRAM (TEE);  Surgeon: Josue Hector, MD;  Location: [facility] ENDOSCOPY;  Service: Cardiovascular;  Laterality: N/A;    Current Medications: Prior to Admission  medications   Medication Sig Start Date End Date Taking? Authorizing Provider  amLODipine (NORVASC) 10 MG tablet Take 10 mg by mouth daily. 10/25/16   [provider]  amoxicillin (AMOXIL) 500 MG tablet Take 4 tablets (2 grams) by mouth 30-60 minutes prior to dental visit. 08/24/17   End, [name], MD  Apoaequorin (PREVAGEN EXTRA STRENGTH) 20 MG CAPS Take 1 tablet by mouth daily. 09/20/18   Nicholas Lose, MD  aspirin EC 81 MG tablet Take 81 mg by mouth daily.    [provider]  Capsicum, Cayenne, (CAYENNE PEPPER PO) Take by mouth. Takes 1 tablet po as directed. This is a complex tablet along with ginko biloba    [provider]  carvedilol (COREG) 6.25 MG tablet TAKE 1 TABLET BY MOUTH TWICE A DAY 05/28/18   End, [name], MD  cholecalciferol (VITAMIN D) 1000 units tablet Take 1,000 Units by mouth daily.    [provider]  folic acid (FOLVITE) 1 MG tablet Take 3 tablets (3 mg total) by mouth daily. 09/20/18   Nicholas Lose, MD  Ginkgo Biloba 40 MG TABS Take by mouth. Unsure dose. Complex tablet along with cayenne pepper. Takes as  directed.    [provider]  iron polysaccharides (NIFEREX) 150 MG capsule Take 1 capsule (150 mg total) by mouth daily. 12/09/16   Barrett, Erin R, PA-C  losartan (COZAAR) 100 MG tablet Take 1 tablet (100 mg total) by mouth daily. 03/29/17 08/20/18  [name] T, PA-C  Sage Leaf POWD by Does not apply route as directed. BID    [provider]  Tetrahydrozoline HCl (VISINE OP) Apply to eye as directed.    [provider]    Allergies:   Patient has no known allergies.   Social History   Socioeconomic History  . Marital status: Widowed    Spouse name: Not on file  . Number of children: 2  . Years of education: 45  . Highest education level: Not on file  Occupational History    Comment: retired  Scientific laboratory technician  . Financial resource strain: Not on file  . Food insecurity:    Worry: Not on file     Inability: Not on file  . Transportation needs:    Medical: Not on file    Non-medical: Not on file  Tobacco Use  . Smoking status: Former Smoker    Packs/day: 0.25    Types: Cigarettes    Last attempt to quit: 11/16/2016    Years since quitting: 1.8  . Smokeless tobacco: Never Used  Substance and Sexual Activity  . Alcohol use: No  . Drug use: No  . Sexual activity: Not Currently  Lifestyle  . Physical activity:    Days per week: Not on file    Minutes per session: Not on file  . Stress: Not on file  Relationships  . Social connections:    Talks on phone: Not on file    Gets together: Not on file    Attends religious service: Not on file    Active member of club or organization: Not on file    Attends meetings of clubs or organizations: Not on file    Relationship status: Not on file  Other Topics Concern  . Not on file  Social History Narrative   Lives alone   Caffeine      Family History:  The patient's family history includes Cancer in his father.   ROS:   Please see the history of present illness.    ROS All other systems reviewed and are negative.   PHYSICAL EXAM:   VS:  BP (!) 158/82   Pulse 60   Ht 5' 7.5" (1.715 m)   Wt 201 lb 12.8 oz (91.5 kg)   SpO2 99%   BMI 31.14 kg/m    GEN: Well nourished, well developed, in no acute distress  HEENT: normal  Neck: no JVD, carotid bruits, or masses Cardiac: RRR; no murmurs, rubs, or gallops,no edema, right groin area with mild hematoma and ecchymosis Respiratory:  clear to auscultation bilaterally, normal work of breathing GI: soft, nontender, nondistended, + BS MS: no deformity or atrophy  Skin: warm and dry, no rash Neuro:  Alert and Oriented x 3, Strength and sensation are intact Psych: euthymic mood, full affect  Wt Readings from Last 3 Encounters:  10/10/18 201 lb 12.8 oz (91.5 kg)  09/20/18 202 lb 3.2 oz (91.7 kg)  09/07/18 206 lb 3.2 oz (93.5 kg)      Studies/Labs Reviewed:   EKG:  EKG is not   ordered today.    Recent Labs: 07/19/2018: TSH 1.757 10/04/2018: ALT <6; BUN 14; Creatinine 1.96; Hemoglobin 8.7; Platelet  Count 159; Potassium 4.6; Sodium 140   Lipid Panel    Component Value Date/Time   CHOL 106 11/19/2016 0323   TRIG 97 11/19/2016 0323   HDL 27 (L) 11/19/2016 0323   CHOLHDL 3.9 11/19/2016 0323   VLDL 19 11/19/2016 0323   LDLCALC 60 11/19/2016 0323    Additional studies/ records that were reviewed today include:  TEE 08/22/18 Study Conclusions  - Left ventricle: Systolic function was normal. The estimated   ejection fraction was in the range of 55% to 60%. - Aortic valve: Intuity Edwards bioprosthetic valve 23 mm The valve   is not ideally visualized due to shadowing from the stents. 3D   imaging also suboptimal Moderate appearing AR. I reviewed his TTE   as well. Particularly on 3D imaging # 34 the regurgitation seems   to be more from a tear in the base of one of the leaflets and not   PVL. The trans-gastric gradients are also elevated mean 21 mmHg   peak 39 mmHg. The annulus appeared stable with no obvious gaps.   On SA images there may be two areas of regurgitation one at 9:00   and on 3D images at 12:00 which appears to be more within the   annulus at the base of a leaflet - Mitral valve: There was mild regurgitation. - Left atrium: The LAA not well visualized Op note does not report   it being excluded The atrium was severely dilated. - Right ventricle: Pacing wires seen in RA/RV. - Right atrium: The atrium was dilated. - Atrial septum: No defect or patent foramen ovale was identified.     ASSESSMENT & PLAN:    1. Paravalvular leak of prosthetic valve [facility].  Will request records.  2.  Postprocedural hematoma and ecchymosis -Symptoms resolving based on symptoms and exam.  No bruit.  He is scheduled to got CBC tomorrow by hematologist/oncology.  3.  Chronic combined CHF -EF improved on most recent  echocardiogram. -Continue Coreg and losartan.  4.  Hypertension -Elevated today however has not took his medication this morning.  Advised to keep log.  Medication Adjustments/Labs and Tests Ordered: Current medicines are reviewed at length with the patient today.  Concerns regarding medicines are outlined above.  Medication changes, Labs and Tests ordered today are listed in the Patient Instructions below. Patient Instructions  Medication Instructions:  Your physician recommends that you continue on your current medications as directed. Please refer to the Current Medication list given to you today.  If you need a refill on your cardiac medications before your next appointment, please call your pharmacy.   Lab work: None ordered  If you have labs (blood work) drawn today and your tests are completely normal, you will receive your results only by: Marland Kitchen MyChart Message (if you have MyChart) OR . A paper copy in the mail If you have any lab test that is abnormal or we need to change your treatment, we will call you to review the results.  Testing/Procedures: None ordered  Follow-Up: At [facility], you and your health needs are our priority.  As part of our continuing mission to provide you with exceptional heart care, we have created designated Provider Care Teams.  These Care Teams include your primary Cardiologist (physician) and Advanced Practice Providers (APPs -  Physician Assistants and Nurse Practitioners) who all work together to provide you with the care you need, when you need it. You will need a follow up appointment  in:  4 months.   You may see Sherren Mocha, MD or one of the following Advanced Practice Providers on your designated Care Team: [name], PA-C [place], [place] . Daune Perch, NP  Any Other Special Instructions Will Be Listed Below (If Applicable).       Jarrett Soho, [place]  10/10/2018 9:10 AM    [address], [place], [place]  [address] Phone: [phone]; Fax: [phone]

## 2018-10-10 ENCOUNTER — Ambulatory Visit (INDEPENDENT_AMBULATORY_CARE_PROVIDER_SITE_OTHER): Payer: Medicare Other | Admitting: Physician Assistant

## 2018-10-10 ENCOUNTER — Encounter: Payer: Self-pay | Admitting: Physician Assistant

## 2018-10-10 ENCOUNTER — Encounter (HOSPITAL_COMMUNITY): Payer: Medicare Other

## 2018-10-10 VITALS — BP 158/82 | HR 60 | Ht 67.5 in | Wt 201.8 lb

## 2018-10-10 DIAGNOSIS — I10 Essential (primary) hypertension: Secondary | ICD-10-CM

## 2018-10-10 DIAGNOSIS — I5042 Chronic combined systolic (congestive) and diastolic (congestive) heart failure: Secondary | ICD-10-CM | POA: Diagnosis not present

## 2018-10-10 DIAGNOSIS — T8203XD Leakage of heart valve prosthesis, subsequent encounter: Secondary | ICD-10-CM | POA: Diagnosis not present

## 2018-10-10 DIAGNOSIS — Z952 Presence of prosthetic heart valve: Secondary | ICD-10-CM

## 2018-10-10 DIAGNOSIS — I2510 Atherosclerotic heart disease of native coronary artery without angina pectoris: Secondary | ICD-10-CM | POA: Diagnosis not present

## 2018-10-10 NOTE — Patient Instructions (Addendum)
Medication Instructions:  Your physician recommends that you continue on your current medications as directed. Please refer to the Current Medication list given to you today.  If you need a refill on your cardiac medications before your next appointment, please call your pharmacy.   Lab work: None ordered  If you have labs (blood work) drawn today and your tests are completely normal, you will receive your results only by: Marland Kitchen MyChart Message (if you have MyChart) OR . A paper copy in the mail If you have any lab test that is abnormal or we need to change your treatment, we will call you to review the results.  Testing/Procedures: None ordered  Follow-Up: At [facility], you and your health needs are our priority.  As part of our continuing mission to provide you with exceptional heart care, we have created designated Provider Care Teams.  These Care Teams include your primary Cardiologist (physician) and Advanced Practice Providers (APPs -  Physician Assistants and Nurse Practitioners) who all work together to provide you with the care you need, when you need it. You will need a follow up appointment in:  4 months.   You may see Sherren Mocha, MD or one of the following Advanced Practice Providers on your designated Care Team: [name], PA-C [place], [place] . Daune Perch, NP  Any Other Special Instructions Will Be Listed Below (If Applicable).

## 2018-10-11 ENCOUNTER — Ambulatory Visit: Payer: Medicare Other | Admitting: Physician Assistant

## 2018-10-11 ENCOUNTER — Inpatient Hospital Stay: Payer: Medicare Other

## 2018-10-11 ENCOUNTER — Other Ambulatory Visit: Payer: Medicare Other

## 2018-10-11 DIAGNOSIS — D649 Anemia, unspecified: Secondary | ICD-10-CM

## 2018-10-11 DIAGNOSIS — D599 Acquired hemolytic anemia, unspecified: Secondary | ICD-10-CM | POA: Diagnosis not present

## 2018-10-11 DIAGNOSIS — I351 Nonrheumatic aortic (valve) insufficiency: Secondary | ICD-10-CM | POA: Diagnosis not present

## 2018-10-11 LAB — CMP (CANCER CENTER ONLY)
ALT: 7 U/L (ref 0–44)
ANION GAP: 10 (ref 5–15)
AST: 18 U/L (ref 15–41)
Albumin: 4.1 g/dL (ref 3.5–5.0)
Alkaline Phosphatase: 71 U/L (ref 38–126)
BILIRUBIN TOTAL: 0.6 mg/dL (ref 0.3–1.2)
BUN: 12 mg/dL (ref 8–23)
CO2: 26 mmol/L (ref 22–32)
Calcium: 8.9 mg/dL (ref 8.9–10.3)
Chloride: 108 mmol/L (ref 98–111)
Creatinine: 1.79 mg/dL — ABNORMAL HIGH (ref 0.61–1.24)
GFR, Est AFR Am: 41 mL/min — ABNORMAL LOW (ref >60)
GFR, Estimated: 36 mL/min — ABNORMAL LOW (ref >60)
Glucose, Bld: 97 mg/dL (ref 70–99)
Potassium: 3.9 mmol/L (ref 3.5–5.1)
Sodium: 144 mmol/L (ref 135–145)
Total Protein: 6.9 g/dL (ref 6.5–8.1)

## 2018-10-11 LAB — CBC WITH DIFFERENTIAL (CANCER CENTER ONLY)
Abs Immature Granulocytes: 0.01 10*3/uL (ref 0.00–0.07)
Basophils Absolute: 0.1 10*3/uL (ref 0.0–0.1)
Basophils Relative: 1 %
EOS PCT: 3 %
Eosinophils Absolute: 0.2 10*3/uL (ref 0.0–0.5)
HCT: 27.7 % — ABNORMAL LOW (ref 39.0–52.0)
Hemoglobin: 9.2 g/dL — ABNORMAL LOW (ref 13.0–17.0)
Immature Granulocytes: 0 %
Lymphocytes Relative: 10 %
Lymphs Abs: 0.5 10*3/uL — ABNORMAL LOW (ref 0.7–4.0)
MCH: 30.4 pg (ref 26.0–34.0)
MCHC: 33.2 g/dL (ref 30.0–36.0)
MCV: 91.4 fL (ref 80.0–100.0)
Monocytes Absolute: 0.5 10*3/uL (ref 0.1–1.0)
Monocytes Relative: 10 %
Neutro Abs: 4 10*3/uL (ref 1.7–7.7)
Neutrophils Relative %: 76 %
Platelet Count: 136 10*3/uL — ABNORMAL LOW (ref 150–400)
RBC: 3.03 MIL/uL — ABNORMAL LOW (ref 4.22–5.81)
RDW: 14.5 % (ref 11.5–15.5)
WBC Count: 5.2 10*3/uL (ref 4.0–10.5)
nRBC: 0 % (ref 0.0–0.2)

## 2018-10-11 LAB — SAMPLE TO BLOOD BANK

## 2018-10-12 ENCOUNTER — Encounter (HOSPITAL_COMMUNITY): Payer: Medicare Other

## 2018-10-15 ENCOUNTER — Encounter (HOSPITAL_COMMUNITY): Payer: Medicare Other

## 2018-10-17 ENCOUNTER — Encounter (HOSPITAL_COMMUNITY): Payer: Medicare Other

## 2018-10-17 NOTE — Progress Notes (Signed)
Patient Care Team: [name], MD as PCP - General (Internal Medicine) Sherren Mocha, MD as PCP - Cardiology (Cardiology) Constance Haw, MD as PCP - Electrophysiology (Cardiology) Rexene Alberts, MD as Consulting Physician (Cardiothoracic Surgery)  DIAGNOSIS:    ICD-10-CM   1. Hemolytic anemia, acute (HCC) D59.9     CHIEF COMPLIANT: Follow-up of severe valve induced hemolytic anemia  INTERVAL HISTORY: James Curtis is a 78 y.o. 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 On 09/27/18 he had valve surgery and is recovering well. He presents to the clinic today with his son, who is his caregiver and primary historian. He did not receive his last scheduled transfusion on 10/04/18 because his Hg and iron had slightly improved. His son would prefer not to have weekly visits to the clinic due to concerns about coronavirus and pain and discomfort with repeated blood draws. His labs from today show: Hg 9.1, platelets 115, WBC 5.0.   REVIEW OF SYSTEMS:   Constitutional: Denies fevers, chills or abnormal weight loss Eyes: Denies blurriness of vision Ears, nose, mouth, throat, and face: Denies mucositis or sore throat Respiratory: Denies cough, dyspnea or wheezes Cardiovascular: Denies palpitation, chest discomfort Gastrointestinal: Denies nausea, heartburn or change in bowel habits Skin: Denies abnormal skin rashes Lymphatics: Denies new lymphadenopathy or easy bruising Neurological: Denies numbness, tingling or new weaknesses Behavioral/Psych: Mood is stable, no new changes  Extremities: No lower extremity edema All other systems were reviewed with the patient and are negative.  I have reviewed the past medical history, past surgical history, social history and family history with the patient and they are unchanged from previous note.  ALLERGIES:  has No Known  Allergies.  MEDICATIONS:  Current Outpatient Medications  Medication Sig Dispense Refill  . amLODipine (NORVASC) 10 MG tablet Take 10 mg by mouth daily.  11  . amoxicillin (AMOXIL) 500 MG tablet Take 4 tablets (2 grams) by mouth 30-60 minutes prior to dental visit. 12 tablet 1  . Apoaequorin (PREVAGEN EXTRA STRENGTH) 20 MG CAPS Take 1 tablet by mouth daily.    Marland Kitchen aspirin EC 81 MG tablet Take 81 mg by mouth daily.    . Capsicum, Cayenne, (CAYENNE PEPPER PO) Take by mouth. Takes 1 tablet po as directed. This is a complex tablet along with ginko biloba    . carvedilol (COREG) 6.25 MG tablet TAKE 1 TABLET BY MOUTH TWICE A DAY 180 tablet 2  . cholecalciferol (VITAMIN D) 1000 units tablet Take 1,000 Units by mouth daily.    . folic acid (FOLVITE) 1 MG tablet Take 3 tablets (3 mg total) by mouth daily. 90 tablet 3  . Ginkgo Biloba 40 MG TABS Take by mouth. Unsure dose. Complex tablet along with cayenne pepper. Takes as directed.    Marland Kitchen losartan (COZAAR) 100 MG tablet Take 1 tablet (100 mg total) by mouth daily. 90 tablet 3  . Sage Leaf POWD by Does not apply route as directed. BID    . Tetrahydrozoline HCl (VISINE OP) Apply to eye as directed.     No current facility-administered medications for this visit.     PHYSICAL EXAMINATION: ECOG PERFORMANCE STATUS: 1 - Symptomatic but completely ambulatory  Vitals:   10/18/18 1048  BP: (!) 121/94  Pulse: (!) 59  Resp: 16  Temp: 98.3 F (36.8 C)  SpO2: 100%   Filed Weights   10/18/18 1048  Weight: 205 lb 12.8 oz (93.4 kg)  GENERAL: alert, no distress and comfortable SKIN: skin color, texture, turgor are normal, no rashes or significant lesions EYES: normal, Conjunctiva are pink and non-injected, sclera clear OROPHARYNX: no exudate, no erythema and lips, buccal mucosa, and tongue normal  NECK: supple, thyroid normal size, non-tender, without nodularity LYMPH: no palpable lymphadenopathy in the cervical, axillary or inguinal LUNGS: clear to  auscultation and percussion with normal breathing effort HEART: regular rate & rhythm and no murmurs and no lower extremity edema ABDOMEN: abdomen soft, non-tender and normal bowel sounds MUSCULOSKELETAL: no cyanosis of digits and no clubbing  NEURO: alert & oriented x 3 with fluent speech, no focal motor/sensory deficits EXTREMITIES: No lower extremity edema  LABORATORY DATA:  I have reviewed the data as listed CMP Latest Ref Rng & Units 10/18/2018 10/11/2018 10/04/2018  Glucose 70 - 99 mg/dL 140(H) 97 91  BUN 8 - 23 mg/dL 17 12 14   Creatinine 0.61 - 1.24 mg/dL 1.76(H) 1.79(H) 1.96(H)  Sodium 135 - 145 mmol/L 140 144 140  Potassium 3.5 - 5.1 mmol/L 3.9 3.9 4.6  Chloride 98 - 111 mmol/L 109 108 108  CO2 22 - 32 mmol/L 25 26 24   Calcium 8.9 - 10.3 mg/dL 9.0 8.9 9.1  Total Protein 6.5 - 8.1 g/dL 6.5 6.9 7.0  Total Bilirubin 0.3 - 1.2 mg/dL 0.7 0.6 0.8  Alkaline Phos 38 - 126 U/L 65 71 68  AST 15 - 41 U/L 17 18 16   ALT 0 - 44 U/L 11 7 <6    Lab Results  Component Value Date   WBC 5.0 10/18/2018   HGB 9.1 (L) 10/18/2018   HCT 27.5 (L) 10/18/2018   MCV 92.0 10/18/2018   PLT 115 (L) 10/18/2018   NEUTROABS 3.8 10/18/2018    ASSESSMENT & PLAN:  Hemolytic anemia, acute (HCC) Severe valve induced hemolytic anemia, DAT negative Status post valve surgery Prior blood transfusions: 1/18, 1/24, 1/20, 2/6  Lab review  10/11/2018: Hemoglobin 9.2, platelets 136 10/18/2018: Hemoglobin 9.1, platelets 115  Based on these results patient does not need any further transfusions. We will obtain labs in 2 weeks I will follow him in 4 weeks with labs. I sent a message to his cardiologist Dr. Burt Knack to see if there is any further issues related to the heart valve and when he would undergo another echocardiogram.  Continue with folic acid supplementation.  Encouraged him to eat more protein and green leafy vegetables.     No orders of the defined types were placed in this encounter.  The patient  has a good understanding of the overall plan. he agrees with it. he will call with any problems that may develop before the next visit here.  Nicholas Lose, MD 10/18/2018  [name] am acting as scribe for Dr. Nicholas Lose.  I have reviewed the above documentation for accuracy and completeness, and I agree with the above.

## 2018-10-18 ENCOUNTER — Telehealth: Payer: Self-pay | Admitting: Hematology and Oncology

## 2018-10-18 ENCOUNTER — Inpatient Hospital Stay: Payer: Medicare Other | Attending: Oncology | Admitting: Hematology and Oncology

## 2018-10-18 ENCOUNTER — Other Ambulatory Visit: Payer: Self-pay | Admitting: Hematology and Oncology

## 2018-10-18 ENCOUNTER — Inpatient Hospital Stay: Payer: Medicare Other

## 2018-10-18 DIAGNOSIS — D649 Anemia, unspecified: Secondary | ICD-10-CM

## 2018-10-18 DIAGNOSIS — D599 Acquired hemolytic anemia, unspecified: Secondary | ICD-10-CM

## 2018-10-18 DIAGNOSIS — D589 Hereditary hemolytic anemia, unspecified: Secondary | ICD-10-CM | POA: Diagnosis not present

## 2018-10-18 DIAGNOSIS — I351 Nonrheumatic aortic (valve) insufficiency: Secondary | ICD-10-CM | POA: Diagnosis not present

## 2018-10-18 DIAGNOSIS — Z7982 Long term (current) use of aspirin: Secondary | ICD-10-CM | POA: Insufficient documentation

## 2018-10-18 LAB — COMPREHENSIVE METABOLIC PANEL
ALT: 11 U/L (ref 0–44)
AST: 17 U/L (ref 15–41)
Albumin: 4.2 g/dL (ref 3.5–5.0)
Alkaline Phosphatase: 65 U/L (ref 38–126)
Anion gap: 6 (ref 5–15)
BUN: 17 mg/dL (ref 8–23)
CO2: 25 mmol/L (ref 22–32)
Calcium: 9 mg/dL (ref 8.9–10.3)
Chloride: 109 mmol/L (ref 98–111)
Creatinine, Ser: 1.76 mg/dL — ABNORMAL HIGH (ref 0.61–1.24)
GFR calc Af Amer: 42 mL/min — ABNORMAL LOW (ref >60)
GFR calc non Af Amer: 36 mL/min — ABNORMAL LOW (ref >60)
Glucose, Bld: 140 mg/dL — ABNORMAL HIGH (ref 70–99)
Potassium: 3.9 mmol/L (ref 3.5–5.1)
Sodium: 140 mmol/L (ref 135–145)
Total Bilirubin: 0.7 mg/dL (ref 0.3–1.2)
Total Protein: 6.5 g/dL (ref 6.5–8.1)

## 2018-10-18 LAB — CBC WITH DIFFERENTIAL (CANCER CENTER ONLY)
Abs Immature Granulocytes: 0.01 10*3/uL (ref 0.00–0.07)
BASOS ABS: 0.1 10*3/uL (ref 0.0–0.1)
Basophils Relative: 1 %
Eosinophils Absolute: 0.2 10*3/uL (ref 0.0–0.5)
Eosinophils Relative: 3 %
HEMATOCRIT: 27.5 % — AB (ref 39.0–52.0)
Hemoglobin: 9.1 g/dL — ABNORMAL LOW (ref 13.0–17.0)
Immature Granulocytes: 0 %
Lymphocytes Relative: 11 %
Lymphs Abs: 0.6 10*3/uL — ABNORMAL LOW (ref 0.7–4.0)
MCH: 30.4 pg (ref 26.0–34.0)
MCHC: 33.1 g/dL (ref 30.0–36.0)
MCV: 92 fL (ref 80.0–100.0)
Monocytes Absolute: 0.4 10*3/uL (ref 0.1–1.0)
Monocytes Relative: 9 %
NEUTROS PCT: 76 %
Neutro Abs: 3.8 10*3/uL (ref 1.7–7.7)
Platelet Count: 115 10*3/uL — ABNORMAL LOW (ref 150–400)
RBC: 2.99 MIL/uL — ABNORMAL LOW (ref 4.22–5.81)
RDW: 14.2 % (ref 11.5–15.5)
WBC Count: 5 10*3/uL (ref 4.0–10.5)
nRBC: 0 % (ref 0.0–0.2)

## 2018-10-18 LAB — SAMPLE TO BLOOD BANK

## 2018-10-18 NOTE — Telephone Encounter (Signed)
Gave avs and calendar patient said he needed early

## 2018-10-18 NOTE — Assessment & Plan Note (Signed)
Severe valve induced hemolytic anemia, DAT negative Status post valve surgery Prior blood transfusions: 1/18, 1/24, 1/20, 2/6  Lab review  10/11/2018: Hemoglobin 9.2, platelets 136 10/18/2018:  Based on these results patient does not need any further transfusions. We do not need to do weekly blood work.  We will see the patient in 4 weeks. Continue with folic acid supplementation.

## 2018-10-19 ENCOUNTER — Telehealth: Payer: Self-pay

## 2018-10-19 ENCOUNTER — Encounter (HOSPITAL_COMMUNITY): Payer: Medicare Other

## 2018-10-19 NOTE — Telephone Encounter (Signed)
The patient has an echo already scheduled 3/17. Will send results to Dr. Burt Knack when complete.

## 2018-10-19 NOTE — Telephone Encounter (Signed)
-----   Message from Sherren Mocha, MD sent at 10/18/2018  3:00 PM EST ----- If he's still showing signs of hemolysis, I'll go ahead and order an echo to take a look at his valve. Will let you know what we find. thx ----- Message ----- From: Nicholas Lose, MD Sent: 10/18/2018  10:59 AM EST To: Sherren Mocha, MD  Dr.Cooper I am following James Curtis for his anemia which was due to hemolysis from the valve leak. Since his recent procedure, I was hoping that his Hb numbers would increase significantly but theyseem to remain at about 9 grams. When do you plan to relook at his valve? Thanks [employer]

## 2018-10-22 ENCOUNTER — Encounter (HOSPITAL_COMMUNITY): Payer: Medicare Other

## 2018-10-24 ENCOUNTER — Encounter (HOSPITAL_COMMUNITY): Payer: Medicare Other

## 2018-10-26 ENCOUNTER — Encounter (HOSPITAL_COMMUNITY): Payer: Medicare Other

## 2018-10-29 ENCOUNTER — Encounter (HOSPITAL_COMMUNITY): Payer: Medicare Other

## 2018-10-30 ENCOUNTER — Other Ambulatory Visit (HOSPITAL_COMMUNITY): Payer: Medicare Other

## 2018-10-31 ENCOUNTER — Encounter (HOSPITAL_COMMUNITY): Payer: Medicare Other

## 2018-11-01 ENCOUNTER — Inpatient Hospital Stay: Payer: Medicare Other

## 2018-11-02 ENCOUNTER — Encounter (HOSPITAL_COMMUNITY): Payer: Medicare Other

## 2018-11-05 ENCOUNTER — Encounter (HOSPITAL_COMMUNITY): Payer: Medicare Other

## 2018-11-07 ENCOUNTER — Encounter (HOSPITAL_COMMUNITY): Payer: Medicare Other

## 2018-11-09 ENCOUNTER — Encounter (HOSPITAL_COMMUNITY): Payer: Medicare Other

## 2018-11-12 ENCOUNTER — Encounter (HOSPITAL_COMMUNITY): Payer: Medicare Other

## 2018-11-14 ENCOUNTER — Other Ambulatory Visit: Payer: Self-pay | Admitting: Thoracic Surgery (Cardiothoracic Vascular Surgery)

## 2018-11-14 ENCOUNTER — Encounter (HOSPITAL_COMMUNITY): Payer: Medicare Other

## 2018-11-14 DIAGNOSIS — R911 Solitary pulmonary nodule: Secondary | ICD-10-CM

## 2018-11-14 DIAGNOSIS — I5189 Other ill-defined heart diseases: Secondary | ICD-10-CM

## 2018-11-15 ENCOUNTER — Other Ambulatory Visit: Payer: Medicare Other

## 2018-11-16 ENCOUNTER — Ambulatory Visit: Payer: Medicare Other | Admitting: Hematology and Oncology

## 2018-11-16 ENCOUNTER — Encounter (HOSPITAL_COMMUNITY): Payer: Medicare Other

## 2018-11-19 ENCOUNTER — Encounter (HOSPITAL_COMMUNITY): Payer: Medicare Other

## 2018-11-20 ENCOUNTER — Telehealth: Payer: Self-pay | Admitting: Hematology and Oncology

## 2018-11-20 NOTE — Telephone Encounter (Signed)
Scheduled per sch msg. Called son, no answer. Left msg. Mailed printout.

## 2018-11-21 ENCOUNTER — Ambulatory Visit (INDEPENDENT_AMBULATORY_CARE_PROVIDER_SITE_OTHER): Payer: Medicare Other | Admitting: *Deleted

## 2018-11-21 ENCOUNTER — Encounter: Payer: Medicare Other | Admitting: *Deleted

## 2018-11-21 ENCOUNTER — Encounter (HOSPITAL_COMMUNITY): Payer: Medicare Other

## 2018-11-21 ENCOUNTER — Other Ambulatory Visit: Payer: Self-pay

## 2018-11-21 DIAGNOSIS — I442 Atrioventricular block, complete: Secondary | ICD-10-CM

## 2018-11-21 LAB — CUP PACEART REMOTE DEVICE CHECK
Battery Remaining Longevity: 103 mo
Battery Voltage: 2.99 V
Brady Statistic AP VP Percent: 78.93 %
Brady Statistic AP VS Percent: 0.07 %
Brady Statistic AS VP Percent: 20.8 %
Brady Statistic AS VS Percent: 0.2 %
Brady Statistic RA Percent Paced: 79.06 %
Brady Statistic RV Percent Paced: 99.72 %
Date Time Interrogation Session: 20200408052623
Implantable Lead Implant Date: 20180409
Implantable Lead Implant Date: 20180409
Implantable Lead Location: 753859
Implantable Lead Location: 753860
Implantable Lead Model: 5076
Implantable Lead Model: 5076
Implantable Pulse Generator Implant Date: 20180409
Lead Channel Impedance Value: 285 Ohm
Lead Channel Impedance Value: 323 Ohm
Lead Channel Impedance Value: 361 Ohm
Lead Channel Impedance Value: 380 Ohm
Lead Channel Pacing Threshold Amplitude: 0.75 V
Lead Channel Pacing Threshold Amplitude: 0.75 V
Lead Channel Pacing Threshold Pulse Width: 0.4 ms
Lead Channel Pacing Threshold Pulse Width: 0.4 ms
Lead Channel Sensing Intrinsic Amplitude: 1.875 mV
Lead Channel Sensing Intrinsic Amplitude: 1.875 mV
Lead Channel Sensing Intrinsic Amplitude: 16.125 mV
Lead Channel Sensing Intrinsic Amplitude: 16.125 mV
Lead Channel Setting Pacing Amplitude: 2 V
Lead Channel Setting Pacing Amplitude: 2.5 V
Lead Channel Setting Pacing Pulse Width: 0.4 ms
Lead Channel Setting Sensing Sensitivity: 2.8 mV

## 2018-11-22 ENCOUNTER — Telehealth: Payer: Self-pay

## 2018-11-22 NOTE — Telephone Encounter (Signed)
-----   Message from Will [name], MD sent at 11/22/2018 10:31 AM EDT ----- Abnormal device interrogation reviewed.  Lead parameters and battery status stable.  8 beats NSVT. Increase coreg to 12.5 mg.

## 2018-11-22 NOTE — Telephone Encounter (Signed)
LMTCB

## 2018-11-23 ENCOUNTER — Encounter (HOSPITAL_COMMUNITY): Payer: Medicare Other

## 2018-11-26 ENCOUNTER — Encounter (HOSPITAL_COMMUNITY): Payer: Medicare Other

## 2018-11-28 ENCOUNTER — Encounter (HOSPITAL_COMMUNITY): Payer: Medicare Other

## 2018-11-30 ENCOUNTER — Encounter (HOSPITAL_COMMUNITY): Payer: Medicare Other

## 2018-11-30 NOTE — Progress Notes (Signed)
Remote pacemaker transmission.   

## 2018-12-03 ENCOUNTER — Encounter (HOSPITAL_COMMUNITY): Payer: Medicare Other

## 2018-12-05 ENCOUNTER — Encounter (HOSPITAL_COMMUNITY): Payer: Medicare Other

## 2018-12-07 ENCOUNTER — Encounter (HOSPITAL_COMMUNITY): Payer: Medicare Other

## 2018-12-10 ENCOUNTER — Encounter (HOSPITAL_COMMUNITY): Payer: Medicare Other

## 2018-12-12 ENCOUNTER — Encounter (HOSPITAL_COMMUNITY): Payer: Medicare Other

## 2018-12-13 NOTE — Assessment & Plan Note (Deleted)
Severe valve induced hemolytic anemia, DAT negative Status post valve surgery Prior blood transfusions: 1/18, 1/24, 1/20, 2/6  Lab review  10/11/2018: Hemoglobin 9.2, platelets 136 10/18/2018: Hemoglobin 9.1, platelets 115  I discussed with his cardiologist Dr. Burt Knack that I still suspect there is ongoing hemolysis.

## 2018-12-14 ENCOUNTER — Encounter (HOSPITAL_COMMUNITY): Payer: Medicare Other

## 2018-12-17 ENCOUNTER — Other Ambulatory Visit: Payer: Self-pay | Admitting: Hematology and Oncology

## 2018-12-17 ENCOUNTER — Encounter (HOSPITAL_COMMUNITY): Payer: Medicare Other

## 2018-12-17 NOTE — Progress Notes (Deleted)
Patient Care Team: [name], MD as PCP - General (Internal Medicine) Sherren Mocha, MD as PCP - Cardiology (Cardiology) Constance Haw, MD as PCP - Electrophysiology (Cardiology) Rexene Alberts, MD as Consulting Physician (Cardiothoracic Surgery)  DIAGNOSIS:    ICD-10-CM   1. Hemolytic anemia, acute (HCC) D59.9     CHIEF COMPLIANT: Follow-up of severe valve induced hemolytic anemia  INTERVAL HISTORY: James Curtis is a 78 y.o. with above-mentioned history of severe anemia related to bioprosthetic aortic valve insufficiency previously treated with trasnfusions who underwent valve surgery on 09/27/18. He presents to the clinic today to review lab work.    REVIEW OF SYSTEMS:   Constitutional: Denies fevers, chills or abnormal weight loss Eyes: Denies blurriness of vision Ears, nose, mouth, throat, and face: Denies mucositis or sore throat Respiratory: Denies cough, dyspnea or wheezes Cardiovascular: Denies palpitation, chest discomfort Gastrointestinal: Denies nausea, heartburn or change in bowel habits Skin: Denies abnormal skin rashes Lymphatics: Denies new lymphadenopathy or easy bruising Neurological: Denies numbness, tingling or new weaknesses Behavioral/Psych: Mood is stable, no new changes  Extremities: No lower extremity edema Breast: denies any pain or lumps or nodules in either breasts All other systems were reviewed with the patient and are negative.  I have reviewed the past medical history, past surgical history, social history and family history with the patient and they are unchanged from previous note.  ALLERGIES:  has No Known Allergies.  MEDICATIONS:  Current Outpatient Medications  Medication Sig Dispense Refill  . amLODipine (NORVASC) 10 MG tablet Take 10 mg by mouth daily.  11  . amoxicillin (AMOXIL) 500 MG tablet Take 4 tablets (2 grams) by mouth 30-60 minutes prior to dental visit. 12 tablet 1  . Apoaequorin (PREVAGEN EXTRA STRENGTH) 20  MG CAPS Take 1 tablet by mouth daily.    Marland Kitchen aspirin EC 81 MG tablet Take 81 mg by mouth daily.    . Capsicum, Cayenne, (CAYENNE PEPPER PO) Take by mouth. Takes 1 tablet po as directed. This is a complex tablet along with ginko biloba    . carvedilol (COREG) 6.25 MG tablet TAKE 1 TABLET BY MOUTH TWICE A DAY 180 tablet 2  . cholecalciferol (VITAMIN D) 1000 units tablet Take 1,000 Units by mouth daily.    . folic acid (FOLVITE) 1 MG tablet TAKE 3 TABLETS BY MOUTH DAILY 270 tablet 1  . Ginkgo Biloba 40 MG TABS Take by mouth. Unsure dose. Complex tablet along with cayenne pepper. Takes as directed.    Marland Kitchen losartan (COZAAR) 100 MG tablet Take 1 tablet (100 mg total) by mouth daily. 90 tablet 3  . Sage Leaf POWD by Does not apply route as directed. BID    . Tetrahydrozoline HCl (VISINE OP) Apply to eye as directed.     No current facility-administered medications for this visit.     PHYSICAL EXAMINATION: ECOG PERFORMANCE STATUS: 1 - Symptomatic but completely ambulatory  There were no vitals filed for this visit. There were no vitals filed for this visit.  GENERAL: alert, no distress and comfortable SKIN: skin color, texture, turgor are normal, no rashes or significant lesions EYES: normal, Conjunctiva are pink and non-injected, sclera clear OROPHARYNX: no exudate, no erythema and lips, buccal mucosa, and tongue normal  NECK: supple, thyroid normal size, non-tender, without nodularity LYMPH: no palpable lymphadenopathy in the cervical, axillary or inguinal LUNGS: clear to auscultation and percussion with normal breathing effort HEART: regular rate & rhythm and no murmurs and no lower extremity edema ABDOMEN:  abdomen soft, non-tender and normal bowel sounds MUSCULOSKELETAL: no cyanosis of digits and no clubbing  NEURO: alert & oriented x 3 with fluent speech, no focal motor/sensory deficits EXTREMITIES: No lower extremity edema  LABORATORY DATA:  I have reviewed the data as listed CMP Latest  Ref Rng & Units 10/18/2018 10/11/2018 10/04/2018  Glucose 70 - 99 mg/dL 140(H) 97 91  BUN 8 - 23 mg/dL 17 12 14   Creatinine 0.61 - 1.24 mg/dL 1.76(H) 1.79(H) 1.96(H)  Sodium 135 - 145 mmol/L 140 144 140  Potassium 3.5 - 5.1 mmol/L 3.9 3.9 4.6  Chloride 98 - 111 mmol/L 109 108 108  CO2 22 - 32 mmol/L 25 26 24   Calcium 8.9 - 10.3 mg/dL 9.0 8.9 9.1  Total Protein 6.5 - 8.1 g/dL 6.5 6.9 7.0  Total Bilirubin 0.3 - 1.2 mg/dL 0.7 0.6 0.8  Alkaline Phos 38 - 126 U/L 65 71 68  AST 15 - 41 U/L 17 18 16   ALT 0 - 44 U/L 11 7 <6    Lab Results  Component Value Date   WBC 5.0 10/18/2018   HGB 9.1 (L) 10/18/2018   HCT 27.5 (L) 10/18/2018   MCV 92.0 10/18/2018   PLT 115 (L) 10/18/2018   NEUTROABS 3.8 10/18/2018    ASSESSMENT & PLAN:  Hemolytic anemia, acute (HCC) Severe valve induced hemolytic anemia, DAT negative Status post valve surgery Prior blood transfusions: 1/18, 1/24, 1/20, 2/6  Lab review  10/11/2018: Hemoglobin 9.2, platelets 136 10/18/2018: Hemoglobin 9.1, platelets 115  I discussed with his cardiologist Dr. Burt Knack that I still suspect there is ongoing hemolysis.   No orders of the defined types were placed in this encounter.  The patient has a good understanding of the overall plan. he agrees with it. he will call with any problems that may develop before the next visit here.  Nicholas Lose, MD 12/18/2018  [name] am acting as scribe for Dr. Nicholas Lose.  I have reviewed the above documentation for accuracy and completeness, and I agree with the above.

## 2018-12-18 ENCOUNTER — Ambulatory Visit: Payer: Medicare Other | Admitting: Hematology and Oncology

## 2018-12-18 ENCOUNTER — Other Ambulatory Visit: Payer: Medicare Other

## 2018-12-18 ENCOUNTER — Other Ambulatory Visit: Payer: Self-pay | Admitting: *Deleted

## 2018-12-18 DIAGNOSIS — D599 Acquired hemolytic anemia, unspecified: Secondary | ICD-10-CM

## 2018-12-19 ENCOUNTER — Encounter (HOSPITAL_COMMUNITY): Payer: Medicare Other

## 2018-12-21 ENCOUNTER — Encounter (HOSPITAL_COMMUNITY): Payer: Medicare Other

## 2018-12-24 ENCOUNTER — Encounter (HOSPITAL_COMMUNITY): Payer: Medicare Other

## 2018-12-26 ENCOUNTER — Encounter (HOSPITAL_COMMUNITY): Payer: Medicare Other

## 2018-12-28 ENCOUNTER — Encounter (HOSPITAL_COMMUNITY): Payer: Medicare Other

## 2018-12-31 ENCOUNTER — Encounter (HOSPITAL_COMMUNITY): Payer: Medicare Other

## 2019-01-02 ENCOUNTER — Encounter (HOSPITAL_COMMUNITY): Payer: Medicare Other

## 2019-01-03 ENCOUNTER — Encounter: Payer: Self-pay | Admitting: Physician Assistant

## 2019-01-04 ENCOUNTER — Encounter (HOSPITAL_COMMUNITY): Payer: Medicare Other

## 2019-01-09 ENCOUNTER — Encounter (HOSPITAL_COMMUNITY): Payer: Medicare Other

## 2019-01-11 ENCOUNTER — Encounter (HOSPITAL_COMMUNITY): Payer: Medicare Other

## 2019-01-28 ENCOUNTER — Inpatient Hospital Stay: Admission: RE | Admit: 2019-01-28 | Payer: Medicare Other | Source: Ambulatory Visit

## 2019-01-28 ENCOUNTER — Ambulatory Visit (HOSPITAL_COMMUNITY): Payer: Medicare Other

## 2019-01-28 ENCOUNTER — Telehealth (HOSPITAL_COMMUNITY): Payer: Self-pay | Admitting: Radiology

## 2019-01-28 ENCOUNTER — Ambulatory Visit: Payer: Medicare Other | Admitting: Thoracic Surgery (Cardiothoracic Vascular Surgery)

## 2019-01-28 NOTE — Telephone Encounter (Signed)
Left message to call office-Patient needs to schedule an echocardiogram.  

## 2019-01-29 ENCOUNTER — Encounter: Payer: Self-pay | Admitting: Thoracic Surgery (Cardiothoracic Vascular Surgery)

## 2019-02-06 ENCOUNTER — Ambulatory Visit: Payer: Medicare Other | Admitting: Cardiovascular Disease

## 2019-02-10 IMAGING — CT CT ANGIO CHEST
2 of 4 series · 10 of 29 positions shown · IV contrast (iopamidol)
Comparison: PET-CT 06/29/2018.

CLINICAL DATA: 77-year-old male with history of aortic valve
replacement with para valvular leak. History of left atrial myxoma
status post resection. Evaluate for potential TAVR (transcatheter
aortic valve replacement) procedure.

EXAM:
CT ANGIOGRAPHY CHEST, ABDOMEN AND PELVIS
TECHNIQUE: Multidetector CT imaging through the chest, abdomen and pelvis was
performed using the standard protocol during bolus administration of
intravenous contrast. Multiplanar reconstructed images and MIPs were
obtained and reviewed to evaluate the vascular anatomy.
CONTRAST:  100mL FAUI0Z-UTQ IOPAMIDOL (FAUI0Z-UTQ) INJECTION 76%

[Series 6: ax thins · axial · 0.60mm/px · z∈[+1190,+1199]mm · 2 of 652 slices shown]
[im 317/652  lung]
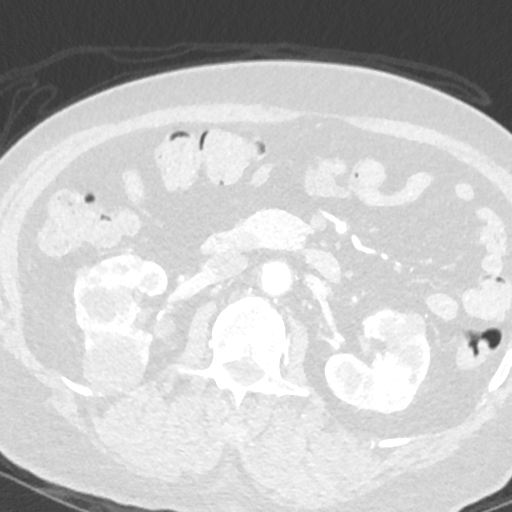
[im 326/652  mediastinal]
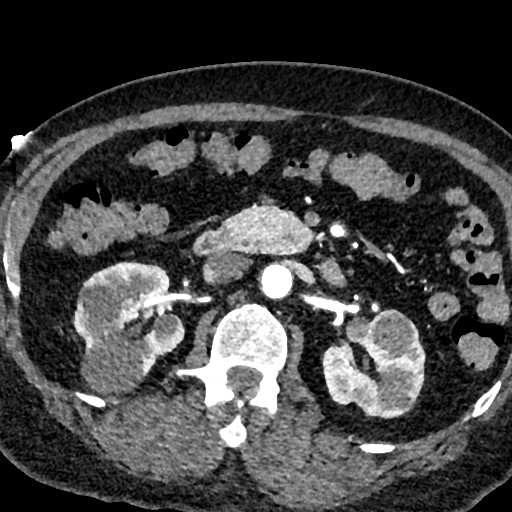

[Series 8: 0-90% · axial · 0.39mm/px · z∈[+1260,+1447]mm · 8 of 4010 slices shown]
[im 446/4010  lung]
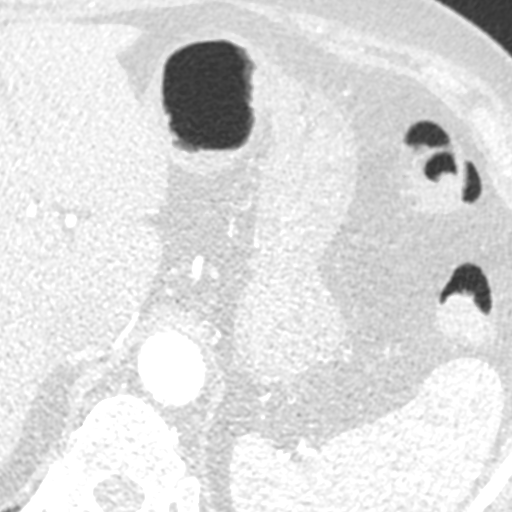
[im 891/4010  lung]
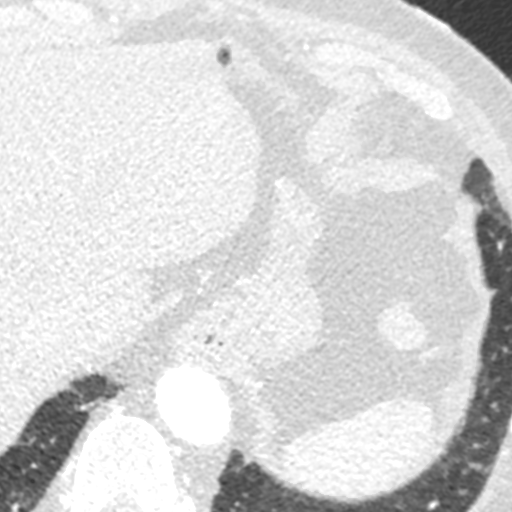
[im 1337/4010  lung]
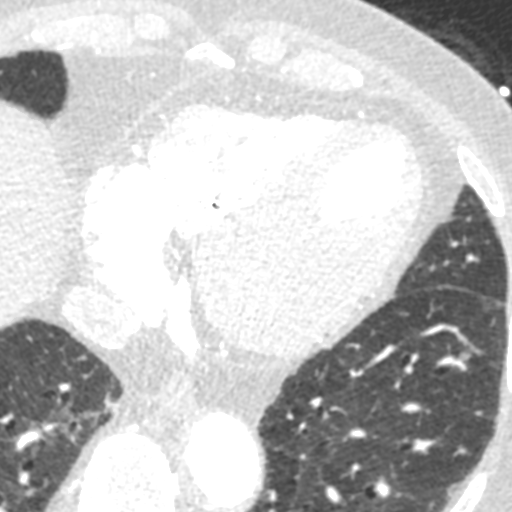
[im 1782/4010  lung]
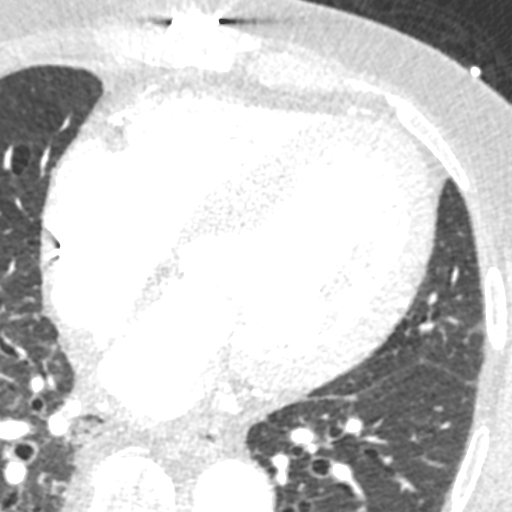
[im 2228/4010  lung]
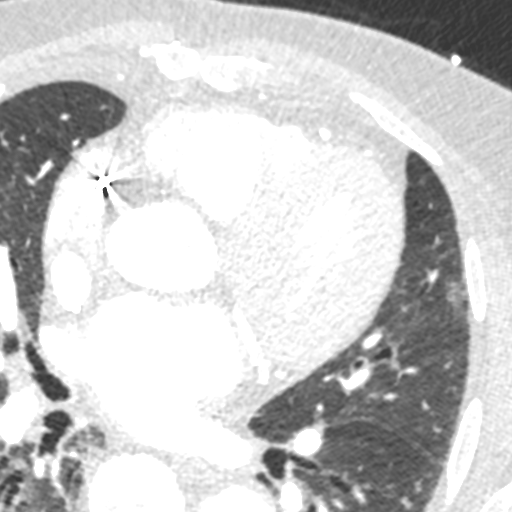
[im 2673/4010  lung]
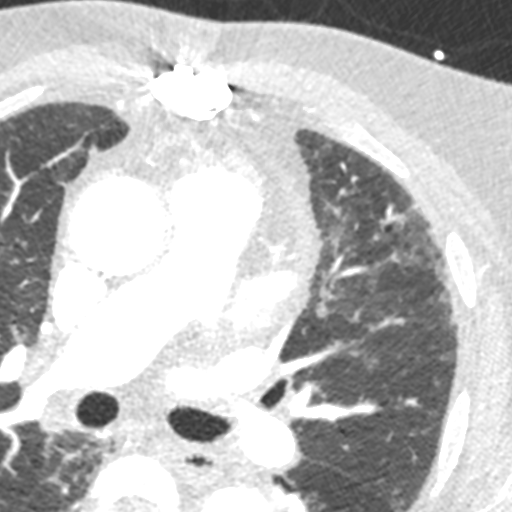
[im 3119/4010  lung]
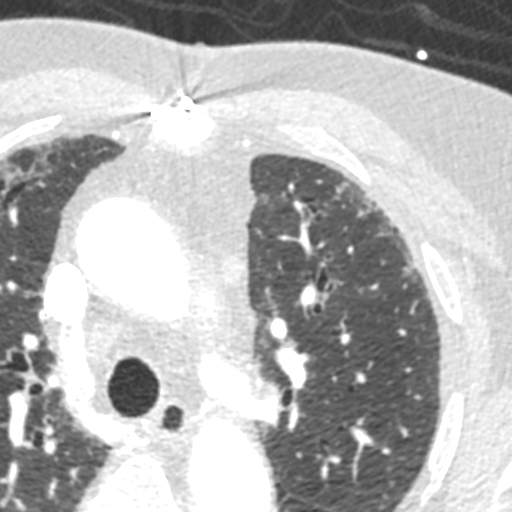
[im 3564/4010  lung]
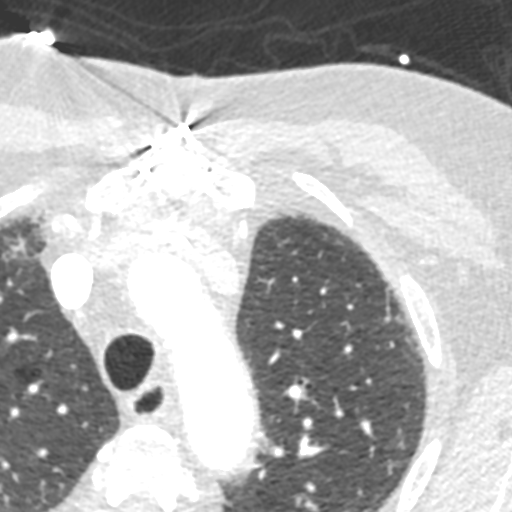

[10 of 29 positions shown; findings below may reference images not displayed]

FINDINGS: CTA CHEST FINDINGS

Cardiovascular: Heart size is mildly enlarged with concentric left
ventricular hypertrophy. There is no significant pericardial fluid,
thickening or pericardial calcification. There is aortic
atherosclerosis, as well as atherosclerosis of the great vessels of
the mediastinum and the coronary arteries, including calcified
atherosclerotic plaque in the left main, left anterior descending,
left circumflex and right coronary arteries. Status post median
sternotomy for aortic valve replacement with what appears to be a
stented bioprosthesis. No evidence of thoracic aortic aneurysm or
dissection on today's examination. Left-sided pacemaker device in
place with lead tips terminating in the right atrial appendage and
near the right ventricular apex.

Mediastinum/Lymph Nodes: No pathologically enlarged mediastinal or
hilar lymph nodes. Esophagus is unremarkable in appearance. No
axillary lymphadenopathy.

Lungs/Pleura: Stent persistent left lower lobe pulmonary nodule
which currently measures 1.4 x 1.0 cm (axial image 64 of series 15).
Patchy areas of ground-glass attenuation and septal thickening again
noted throughout the lungs bilaterally, most evident throughout the
mid to lower lungs where there is also some cylindrical
bronchiectasis and thickening of the peribronchovascular
interstitium, similar to prior studies. No confluent consolidative
airspace disease. No pleural effusions.

Musculoskeletal/Soft Tissues: Median sternotomy wires. There are no
aggressive appearing lytic or blastic lesions noted in the
visualized portions of the skeleton.

CTA ABDOMEN AND PELVIS FINDINGS

Hepatobiliary: No suspicious cystic or solid hepatic lesions. No
intra or extrahepatic biliary ductal dilatation. Gallbladder is
normal in appearance.

Pancreas: No pancreatic mass. No pancreatic ductal dilatation. No
pancreatic or peripancreatic fluid or inflammatory changes.

Spleen: Unremarkable.

Adrenals/Urinary Tract: Mild nodular thickening of the adrenal
glands bilaterally, stable compared to prior examinations. Numerous
low-attenuation lesions scattered throughout both kidneys, largest
of which are compatible with simple cysts which measure up to
cm. Multiple other subcentimeter low-attenuation lesions in both
kidneys are too small to definitively characterize, but are
statistically likely to represent tiny cysts. No
hydroureteronephrosis. Urinary bladder is normal in appearance.

Stomach/Bowel: Normal appearance of the stomach. No pathologic
dilatation of small bowel or colon. Normal appendix.

Vascular/Lymphatic: Aortic atherosclerosis, with vascular findings
and measurements pertinent to potential TAVR procedure, as detailed
below. No aneurysm or dissection noted in the abdominal or pelvic
vasculature. No lymphadenopathy noted in the abdomen or pelvis.

Reproductive: Prostate gland and seminal vesicles are unremarkable
in appearance.

Other: No significant volume of ascites.  No pneumoperitoneum.

Musculoskeletal: There are no aggressive appearing lytic or blastic
lesions noted in the visualized portions of the skeleton.

VASCULAR MEASUREMENTS PERTINENT TO TAVR:

AORTA:

Minimal Aortic Niameter-RC x 16 mm

Severity of Aortic Calcification-moderate

RIGHT PELVIS:

Right Common Iliac Artery -

Minimal Eiameter-DD.F x 10.6 mm

Tortuosity-mild

Calcification - mild

Right External Iliac Artery -

Minimal 6iameter-8.A x 9.1 mm

Tortuosity-severe

Calcification - mild

Right Common Femoral Artery -

Minimal Siameter-4H.N x 9.0 mm

Tortuosity-mild

Calcification - mild

LEFT PELVIS:

Left Common Iliac Artery -

Minimal Ziameter-BV.B x 11.0 mm

Tortuosity-mild

Calcification - mild

Left External Iliac Artery -

Minimal Yiameter-4.1 x 8.7 mm

Tortuosity-severe

Calcification - mild

Left Common Femoral Artery -

Minimal 8iameter-Y.O x 9.9 mm

Tortuosity-mild

Calcification - mild

Review of the MIP images confirms the above findings.
IMPRESSION: 1. Vascular findings and measurements pertinent to potential
transcatheter aortic valve replacement (TAVR) procedure, as detailed
above.
2. Status post aortic valve replacement with stented bioprosthesis.
3. Aortic atherosclerosis, in addition to left main and 3 vessel
coronary artery disease.
4. Persistent 1.4 x 1.0 cm left lower lobe nodule which remains
concerning for neoplasm. Continued attention on follow-up studies is
recommended.
5. Chronic changes throughout the lungs suggestive of interstitial
lung disease such as nonspecific interstitial pneumonia (NSIP),
similar to prior examinations.
6. Additional incidental findings, as above.

## 2019-02-20 ENCOUNTER — Ambulatory Visit (INDEPENDENT_AMBULATORY_CARE_PROVIDER_SITE_OTHER): Payer: Medicare Other | Admitting: *Deleted

## 2019-02-20 DIAGNOSIS — I442 Atrioventricular block, complete: Secondary | ICD-10-CM | POA: Diagnosis not present

## 2019-02-20 LAB — CUP PACEART REMOTE DEVICE CHECK
Battery Remaining Longevity: 100 mo
Battery Voltage: 2.99 V
Brady Statistic AP VP Percent: 90.85 %
Brady Statistic AP VS Percent: 0 %
Brady Statistic AS VP Percent: 9.11 %
Brady Statistic AS VS Percent: 0.04 %
Brady Statistic RA Percent Paced: 90.68 %
Brady Statistic RV Percent Paced: 99.96 %
Date Time Interrogation Session: 20200708051739
Implantable Lead Implant Date: 20180409
Implantable Lead Implant Date: 20180409
Implantable Lead Location: 753859
Implantable Lead Location: 753860
Implantable Lead Model: 5076
Implantable Lead Model: 5076
Implantable Pulse Generator Implant Date: 20180409
Lead Channel Impedance Value: 266 Ohm
Lead Channel Impedance Value: 304 Ohm
Lead Channel Impedance Value: 380 Ohm
Lead Channel Impedance Value: 399 Ohm
Lead Channel Pacing Threshold Amplitude: 0.75 V
Lead Channel Pacing Threshold Amplitude: 0.75 V
Lead Channel Pacing Threshold Pulse Width: 0.4 ms
Lead Channel Pacing Threshold Pulse Width: 0.4 ms
Lead Channel Sensing Intrinsic Amplitude: 1.75 mV
Lead Channel Sensing Intrinsic Amplitude: 1.75 mV
Lead Channel Sensing Intrinsic Amplitude: 4.5 mV
Lead Channel Sensing Intrinsic Amplitude: 4.5 mV
Lead Channel Setting Pacing Amplitude: 2 V
Lead Channel Setting Pacing Amplitude: 2.5 V
Lead Channel Setting Pacing Pulse Width: 0.4 ms
Lead Channel Setting Sensing Sensitivity: 2.8 mV

## 2019-02-25 ENCOUNTER — Telehealth: Payer: Self-pay | Admitting: *Deleted

## 2019-02-25 NOTE — Telephone Encounter (Signed)
lmtcb

## 2019-02-25 NOTE — Telephone Encounter (Signed)
-----   Message from Will [name], MD sent at 02/24/2019  9:40 AM EDT ----- Abnormal device interrogation reviewed.  Lead parameters and battery status stable.  4 hours of AF. Needs appointment to discuss. Can be virtual.

## 2019-03-04 ENCOUNTER — Encounter: Payer: Self-pay | Admitting: Cardiology

## 2019-03-04 NOTE — Progress Notes (Signed)
Remote pacemaker transmission.   

## 2019-03-15 NOTE — Telephone Encounter (Signed)
lmtcb

## 2019-03-18 NOTE — Telephone Encounter (Signed)
New Message    Patient's son returning your call please give him a call back.

## 2019-03-18 NOTE — Telephone Encounter (Signed)
Pt's son returned my call.  Briefly discussed AFib. Appt made w/ EP APP for next week for overdue follow up and discuss new AFib. Son agreeable to plan.

## 2019-03-20 DIAGNOSIS — D649 Anemia, unspecified: Secondary | ICD-10-CM | POA: Diagnosis not present

## 2019-03-20 DIAGNOSIS — G459 Transient cerebral ischemic attack, unspecified: Secondary | ICD-10-CM | POA: Diagnosis not present

## 2019-03-20 DIAGNOSIS — I10 Essential (primary) hypertension: Secondary | ICD-10-CM | POA: Diagnosis not present

## 2019-03-20 DIAGNOSIS — E139 Other specified diabetes mellitus without complications: Secondary | ICD-10-CM | POA: Diagnosis not present

## 2019-03-20 DIAGNOSIS — Z8546 Personal history of malignant neoplasm of prostate: Secondary | ICD-10-CM | POA: Diagnosis not present

## 2019-03-20 DIAGNOSIS — I2510 Atherosclerotic heart disease of native coronary artery without angina pectoris: Secondary | ICD-10-CM | POA: Diagnosis not present

## 2019-03-20 DIAGNOSIS — E782 Mixed hyperlipidemia: Secondary | ICD-10-CM | POA: Diagnosis not present

## 2019-03-20 DIAGNOSIS — N183 Chronic kidney disease, stage 3 (moderate): Secondary | ICD-10-CM | POA: Diagnosis not present

## 2019-03-20 DIAGNOSIS — E7800 Pure hypercholesterolemia, unspecified: Secondary | ICD-10-CM | POA: Diagnosis not present

## 2019-03-20 DIAGNOSIS — E1122 Type 2 diabetes mellitus with diabetic chronic kidney disease: Secondary | ICD-10-CM | POA: Diagnosis not present

## 2019-03-20 DIAGNOSIS — F0390 Unspecified dementia without behavioral disturbance: Secondary | ICD-10-CM | POA: Diagnosis not present

## 2019-03-20 DIAGNOSIS — D598 Other acquired hemolytic anemias: Secondary | ICD-10-CM | POA: Diagnosis not present

## 2019-03-27 ENCOUNTER — Encounter: Payer: Medicare Other | Admitting: Student

## 2019-03-29 ENCOUNTER — Ambulatory Visit (INDEPENDENT_AMBULATORY_CARE_PROVIDER_SITE_OTHER): Payer: Medicare Other | Admitting: Student

## 2019-03-29 ENCOUNTER — Other Ambulatory Visit: Payer: Self-pay

## 2019-03-29 VITALS — BP 148/76 | HR 60 | Ht 67.5 in | Wt 203.0 lb

## 2019-03-29 DIAGNOSIS — I442 Atrioventricular block, complete: Secondary | ICD-10-CM

## 2019-03-29 DIAGNOSIS — I480 Paroxysmal atrial fibrillation: Secondary | ICD-10-CM | POA: Insufficient documentation

## 2019-03-29 DIAGNOSIS — I2510 Atherosclerotic heart disease of native coronary artery without angina pectoris: Secondary | ICD-10-CM | POA: Diagnosis not present

## 2019-03-29 LAB — CUP PACEART INCLINIC DEVICE CHECK
Battery Remaining Longevity: 101 mo
Battery Voltage: 2.99 V
Brady Statistic AP VP Percent: 85.91 %
Brady Statistic AP VS Percent: 0.02 %
Brady Statistic AS VP Percent: 13.99 %
Brady Statistic AS VS Percent: 0.08 %
Brady Statistic RA Percent Paced: 85.87 %
Brady Statistic RV Percent Paced: 99.89 %
Date Time Interrogation Session: 20200814131005
Implantable Lead Implant Date: 20180409
Implantable Lead Implant Date: 20180409
Implantable Lead Location: 753859
Implantable Lead Location: 753860
Implantable Lead Model: 5076
Implantable Lead Model: 5076
Implantable Pulse Generator Implant Date: 20180409
Lead Channel Impedance Value: 285 Ohm
Lead Channel Impedance Value: 342 Ohm
Lead Channel Impedance Value: 380 Ohm
Lead Channel Impedance Value: 418 Ohm
Lead Channel Pacing Threshold Amplitude: 0.75 V
Lead Channel Pacing Threshold Amplitude: 0.75 V
Lead Channel Pacing Threshold Pulse Width: 0.4 ms
Lead Channel Pacing Threshold Pulse Width: 0.4 ms
Lead Channel Sensing Intrinsic Amplitude: 1.375 mV
Lead Channel Sensing Intrinsic Amplitude: 1.625 mV
Lead Channel Sensing Intrinsic Amplitude: 4.5 mV
Lead Channel Sensing Intrinsic Amplitude: 4.5 mV
Lead Channel Setting Pacing Amplitude: 2 V
Lead Channel Setting Pacing Amplitude: 2.5 V
Lead Channel Setting Pacing Pulse Width: 0.4 ms
Lead Channel Setting Sensing Sensitivity: 2.8 mV

## 2019-03-29 NOTE — Progress Notes (Signed)
Electrophysiology Office Note Date: 03/29/2019  ID:  James Curtis, DOB [birth year], MRN [id number]  PCP: [name], MD Primary Cardiologist: Sherren Mocha, MD Electrophysiologist: Constance Haw, MD  CC: Pacemaker follow-up  James Curtis is a 78 y.o. male seen today for Dr. Curt Bears. He presents today for routine electrophysiology followup.  Since last being seen in our clinic, the patient reports doing well.  He had a repair of his bioprosthetic AVR in 09/2018 at [facility] due to perivalvular leak. He denies chest pain, palpitations, dyspnea, PND, orthopnea, nausea, vomiting, dizziness, syncope, edema, weight gain, or early satiety.  Device History: Medtronic Dual Chamber PPM implanted 11/2016 for CHB  Past Medical History:  Diagnosis Date  . Aortic stenosis    a. 11/20/2016 Echo: mod AS;  b. 11/23/2016 TEE restricted mobility of noncoronary cusp;  c. 11/2016 Cath: mean grad 66mHg w/ dual lumen catheter, AVA 2.0cm^2; mean gradient by pullback = 123mg, AVA 2.3cm^2.  . Cardiomyopathy ([facility]   Echo 6/18: EF 40-45, inf/inf-septal HK-AK, Gr 1 DD, severe LVH, MAC, severe LAE, mildly reduced RVSF  . Carotid arterial disease ([place]   a. 11/2016 Carotid U/S: bilat 1-39% ICA stenosis.  . Chronic kidney disease   . Complete heart block ([place]   a. 11/21/2016 s/p MDT AzSantina EvansR MRI SureScan (ser # [id number]).  . Dementia ([place]   mild  . Family history of adverse reaction to anesthesia    Son [name] a difficult time waking up  . Heart murmur   . Hemolytic anemia ([place]  . Hypercholesteremia   . Hypertension    benign  . Incidental pulmonary nodule, greater than or equal to 54m36m needs f/u scan 3 months 11/28/2016   14 x 8 mm (mean diameter of 11 mm) left lower lobe pulmonary nodule with some surrounding ground-glass attenuation (axial image 42 of series 6). 9 x 5 mm (mean diameter of 7 mm) subpleural nodule in the periphery of the right upper lobe (axial image 20 of  series 6). A few patchy areas of peripheral predominant ground-glass attenuation and subpleural reticulation are noted, scattered randomly throu  . Left atrial mass   . 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 11/2009 TEE: EF 60-65%, restricted mobility of noncoronary AoV cusp, 16x4 mm mobile Ca2+ echodensity on the atrial basal surface of the posterior MV leaflet, mild MR, no LA/LAA thrombus.  . Non-obstructive CAD (coronary artery disease)    a. 11/2016 Cath: LM large, nl, LAD min irregs, LCX large, min irregs, OM3 40, RCA large, 87m45m. Paravalvular leak (prosthetic valve)    Echo 9/19: Severe concentric LVH, EF 55-60, normal wall motion, grade 2 diastolic dysfunction, bioprosthetic AVR with mild to moderate perivalvular regurgitation, moderate to severely calcified mitral valve annulus, severe LAE, mild to moderate TR, PASP 42  . Paravalvular leak (prosthetic valve), subsequent encounter   . Pre-diabetes   . [facility]  . Renal mass of unknown nature - needs f/u MRI 11/28/2016   Multiple renal lesions are noted in the kidneys bilaterally. Although several of these lesions are low-attenuation, compatible with cysts, other lesions are intermediate to high attenuation, potentially enhancing  . S/P aortic valve replacement with bioprosthetic valve 12/02/2016   23 mm Edwards Intuity rapid deployment bovine pericardial tissue valve  . S/P resection of left atrial mass 12/02/2016   Benign dystrophic fibrotic tissue with calcification attached to posterior  mitral annulus by thin fibrotic stalk  . Stroke ([place])   . TIA (transient ischemic attack)    a. 11/2016 in setting of complete heart block.   Past Surgical History:  Procedure Laterality Date  . AORTIC VALVE REPLACEMENT N/A 12/02/2016   Procedure: AORTIC VALVE REPLACEMENT (AVR) using a 23 Edwards Intuity Elite Aortic Valve;  Surgeon: Rexene Alberts, MD;  Location: [place];  Service: Open Heart Surgery;  Laterality:  N/A;  . EXCISION OF ATRIAL MYXOMA N/A 12/02/2016   Procedure: Resection of left atrial mass ;  Surgeon: Rexene Alberts, MD;  Location: [place];  Service: Open Heart Surgery;  Laterality: N/A;  . PACEMAKER IMPLANT N/A 11/21/2016   Procedure: Pacemaker Implant;  Surgeon: Will [name], MD;  Location: [place] CV LAB;  Service: Cardiovascular;  Laterality: N/A;  . RIGHT/LEFT HEART CATH AND CORONARY ANGIOGRAPHY N/A 11/25/2016   Procedure: Right/Left Heart Cath and Coronary Angiography;  Surgeon: Nelva Bush, MD;  Location: [place] CV LAB;  Service: Cardiovascular;  Laterality: N/A;  . TEE WITHOUT CARDIOVERSION N/A 11/23/2016   Procedure: TRANSESOPHAGEAL ECHOCARDIOGRAM (TEE);  Surgeon: Jerline Pain, MD;  Location: [place];  Service: Cardiovascular;  Laterality: N/A;  . TEE WITHOUT CARDIOVERSION N/A 12/02/2016   Procedure: TRANSESOPHAGEAL ECHOCARDIOGRAM (TEE);  Surgeon: Rexene Alberts, MD;  Location: [place];  Service: Open Heart Surgery;  Laterality: N/A;  . TEE WITHOUT CARDIOVERSION N/A 08/22/2018   Procedure: TRANSESOPHAGEAL ECHOCARDIOGRAM (TEE);  Surgeon: Josue Hector, MD;  Location: [facility] ENDOSCOPY;  Service: Cardiovascular;  Laterality: N/A;    Current Outpatient Medications  Medication Sig Dispense Refill  . amLODipine (NORVASC) 10 MG tablet Take 10 mg by mouth daily.  11  . amoxicillin (AMOXIL) 500 MG tablet Take 4 tablets (2 grams) by mouth 30-60 minutes prior to dental visit. 12 tablet 1  . aspirin EC 81 MG tablet Take 81 mg by mouth daily.    . Capsicum, Cayenne, (CAYENNE PEPPER PO) Take by mouth. Takes 1 tablet po as directed. This is a complex tablet along with ginko biloba    . carvedilol (COREG) 6.25 MG tablet TAKE 1 TABLET BY MOUTH TWICE A DAY 180 tablet 2  . cholecalciferol (VITAMIN D) 1000 units tablet Take 1,000 Units by mouth daily.    . folic acid (FOLVITE) 1 MG tablet TAKE 3 TABLETS BY MOUTH DAILY 270 tablet 1  . Ginkgo Biloba 40 MG TABS Take by mouth. Unsure  dose. Complex tablet along with cayenne pepper. Takes as directed.    . Omega-3 Fatty Acids (OMEGA 3 PO) Take by mouth daily.    Lia Hopping Leaf POWD by Does not apply route as directed. BID    . Tetrahydrozoline HCl (VISINE OP) Apply to eye as directed.    Marland Kitchen losartan (COZAAR) 100 MG tablet Take 1 tablet (100 mg total) by mouth daily. 90 tablet 3   No current facility-administered medications for this visit.     Allergies:   Patient has no known allergies.   Social History: Social History   Socioeconomic History  . Marital status: Widowed    Spouse name: Not on file  . Number of children: 2  . Years of education: 17  . Highest education level: Not on file  Occupational History    Comment: retired  Scientific laboratory technician  . Financial resource strain: Not on file  . Food insecurity    Worry: Not on file    Inability: Not on file  . Transportation needs  Medical: Not on file    Non-medical: Not on file  Tobacco Use  . Smoking status: Former Smoker    Packs/day: 0.25    Types: Cigarettes    Quit date: 11/16/2016    Years since quitting: 2.3  . Smokeless tobacco: Never Used  Substance and Sexual Activity  . Alcohol use: No  . Drug use: No  . Sexual activity: Not Currently  Lifestyle  . Physical activity    Days per week: Not on file    Minutes per session: Not on file  . Stress: Not on file  Relationships  . Social Herbalist on phone: Not on file    Gets together: Not on file    Attends religious service: Not on file    Active member of club or organization: Not on file    Attends meetings of clubs or organizations: Not on file    Relationship status: Not on file  . Intimate partner violence    Fear of current or ex partner: Not on file    Emotionally abused: Not on file    Physically abused: Not on file    Forced sexual activity: Not on file  Other Topics Concern  . Not on file  Social History Narrative   Lives alone   Caffeine     Family History: Family  History  Problem Relation Age of Onset  . Cancer Father      Review of Systems: All other systems reviewed and are otherwise negative except as noted above.  Physical Exam: Vitals:   03/29/19 1142  BP: (!) 148/76  Pulse: 60  Weight: 203 lb (92.1 kg)  Height: 5' 7.5" (1.715 m)     GEN- The patient is well appearing, alert and oriented x 3 today.   HEENT: normocephalic, atraumatic; sclera clear, conjunctiva pink; hearing intact; oropharynx clear; neck supple  Lungs- Clear to ausculation bilaterally, normal work of breathing.  No wheezes, rales, rhonchi Heart- Regular rate and rhythm, no murmurs, rubs or gallops  GI- soft, non-tender, non-distended, bowel sounds present  Extremities- no clubbing, cyanosis, or edema  MS- no significant deformity or atrophy Skin- warm and dry, no rash or lesion; PPM pocket well healed Psych- euthymic mood, full affect Neuro- strength and sensation are intact  PPM Interrogation- reviewed in detail today,  See PACEART report  EKG:  EKG is ordered today. The ekg ordered today shows AP/VP at 60 bpm personally reviewed  Recent Labs: 07/19/2018: TSH 1.757 10/18/2018: ALT 11; BUN 17; Creatinine, Ser 1.76; Hemoglobin 9.1; Platelet Count 115; Potassium 3.9; Sodium 140   Wt Readings from Last 3 Encounters:  03/29/19 203 lb (92.1 kg)  10/18/18 205 lb 12.8 oz (93.4 kg)  10/10/18 201 lb 12.8 oz (91.5 kg)     Other studies Reviewed: Additional studies/ records that were reviewed today include: Previous office notes, echo, cath, and lab results.    Assessment and Plan:  1.  Symptomatic bradycardia s/p Medtronic PPM  Normal PPM function See Pace Art report No changes today   2. Paroxysmal Afib This patients CHA2DS2-VASc Score and unadjusted Ischemic Stroke Rate (% per year) is at least 3.2 % stroke rate/year from a score of 3 (Pt has pre-diabetes that does not require medication, no recent Hgb A1c in system and previous low EF that has since  normalized with valve repair) We discussed risk of stroke at length today and pt and son adamantly decline consideration of blood thinner at this time. We  discussed his individual risk factor and CHA2DS2VASC score in detail. They want to defer for now.  Left atrial size not well visualized on TEE 08/22/2018 but reports as (severely dilated)  3. Bioprosthetic AVR 11/2016, s/p revision 09/2018 at [facility].  Stable  4. Sickle cell trait Puts patient at increased risk of VTE; discussed today. Recent Hgb have been low. Will recheck today.   Current medicines are reviewed at length with the patient today.   The patient does not have concerns regarding his medicines.  The following changes were made today:  none  Labs/ tests ordered today include:  Orders Placed This Encounter  Procedures  . CBC  . Basic metabolic panel  . CUP PACEART [place]  . EKG 12-Lead    Disposition:   Follow up with Dr. Curt Bears in 6 months. Pt and son know to call if they reconsider anticoagulation. We will also continue to monitor Afib burden on remote checks.   Jacalyn Lefevre, PA-C  03/29/2019 1:11 PM  [address]  [place] [address] [phone] (office) [phone] (fax)  Greater than 50% of the 25 minute visit was spent in counseling/coordination of care regarding disease state education, including risk of stroke, and medication compliance.

## 2019-03-29 NOTE — Patient Instructions (Signed)
Medication Instructions:  Your physician recommends that you continue on your current medications as directed. Please refer to the Current Medication list given to you today.  If you need a refill on your cardiac medications before your next appointment, please call your pharmacy.   Lab work:  CBC BMET   If you have labs (blood work) drawn today and your tests are completely normal, you will receive your results only by: Marland Kitchen MyChart Message (if you have MyChart) OR . A paper copy in the mail If you have any lab test that is abnormal or we need to change your treatment, we will call you to review the results.  Testing/Procedures:NONE ORDERED  TODAY   Follow-Up: At [facility], you and your health needs are our priority.  As part of our continuing mission to provide you with exceptional heart care, we have created designated Provider Care Teams.  These Care Teams include your primary Cardiologist (physician) and Advanced Practice Providers (APPs -  Physician Assistants and Nurse Practitioners) who all work together to provide you with the care you need, when you need it. You will need a follow up appointment in 6 months.  Please call our office 2 months in advance to schedule this appointment.  You may see Will [name], MD or one of the following Advanced Practice Providers on your designated Care Team:   [name], NP . Tommye Standard, PA-C . Donavan Burnet PA-C   Any Other Special Instructions Will Be Listed Below (If Applicable).

## 2019-03-30 LAB — CBC
Hematocrit: 29.5 % — ABNORMAL LOW (ref 37.5–51.0)
Hemoglobin: 10 g/dL — ABNORMAL LOW (ref 13.0–17.7)
MCH: 30.1 pg (ref 26.6–33.0)
MCHC: 33.9 g/dL (ref 31.5–35.7)
MCV: 89 fL (ref 79–97)
Platelets: 202 x10E3/uL (ref 150–450)
RBC: 3.32 x10E6/uL — ABNORMAL LOW (ref 4.14–5.80)
RDW: 13.3 % (ref 11.6–15.4)
WBC: 5.3 x10E3/uL (ref 3.4–10.8)

## 2019-03-30 LAB — BASIC METABOLIC PANEL
BUN/Creatinine Ratio: 11 (ref 10–24)
BUN: 25 mg/dL (ref 8–27)
CO2: 22 mmol/L (ref 20–29)
Calcium: 9.5 mg/dL (ref 8.6–10.2)
Chloride: 103 mmol/L (ref 96–106)
Creatinine, Ser: 2.28 mg/dL — ABNORMAL HIGH (ref 0.76–1.27)
GFR calc Af Amer: 31 mL/min/{1.73_m2} — ABNORMAL LOW (ref >59)
GFR calc non Af Amer: 26 mL/min/{1.73_m2} — ABNORMAL LOW (ref >59)
Glucose: 105 mg/dL — ABNORMAL HIGH (ref 65–99)
Potassium: 4.4 mmol/L (ref 3.5–5.2)
Sodium: 140 mmol/L (ref 134–144)

## 2019-05-17 DIAGNOSIS — Z8546 Personal history of malignant neoplasm of prostate: Secondary | ICD-10-CM | POA: Diagnosis not present

## 2019-05-17 DIAGNOSIS — G459 Transient cerebral ischemic attack, unspecified: Secondary | ICD-10-CM | POA: Diagnosis not present

## 2019-05-17 DIAGNOSIS — E1122 Type 2 diabetes mellitus with diabetic chronic kidney disease: Secondary | ICD-10-CM | POA: Diagnosis not present

## 2019-05-17 DIAGNOSIS — E139 Other specified diabetes mellitus without complications: Secondary | ICD-10-CM | POA: Diagnosis not present

## 2019-05-17 DIAGNOSIS — E782 Mixed hyperlipidemia: Secondary | ICD-10-CM | POA: Diagnosis not present

## 2019-05-17 DIAGNOSIS — N1831 Chronic kidney disease, stage 3a: Secondary | ICD-10-CM | POA: Diagnosis not present

## 2019-05-17 DIAGNOSIS — F0390 Unspecified dementia without behavioral disturbance: Secondary | ICD-10-CM | POA: Diagnosis not present

## 2019-05-17 DIAGNOSIS — E7800 Pure hypercholesterolemia, unspecified: Secondary | ICD-10-CM | POA: Diagnosis not present

## 2019-05-17 DIAGNOSIS — I10 Essential (primary) hypertension: Secondary | ICD-10-CM | POA: Diagnosis not present

## 2019-05-17 DIAGNOSIS — D649 Anemia, unspecified: Secondary | ICD-10-CM | POA: Diagnosis not present

## 2019-05-17 DIAGNOSIS — I2510 Atherosclerotic heart disease of native coronary artery without angina pectoris: Secondary | ICD-10-CM | POA: Diagnosis not present

## 2019-05-17 DIAGNOSIS — D598 Other acquired hemolytic anemias: Secondary | ICD-10-CM | POA: Diagnosis not present

## 2019-05-22 ENCOUNTER — Ambulatory Visit (INDEPENDENT_AMBULATORY_CARE_PROVIDER_SITE_OTHER): Payer: Medicare Other | Admitting: *Deleted

## 2019-05-22 DIAGNOSIS — I480 Paroxysmal atrial fibrillation: Secondary | ICD-10-CM

## 2019-05-22 DIAGNOSIS — I5042 Chronic combined systolic (congestive) and diastolic (congestive) heart failure: Secondary | ICD-10-CM

## 2019-05-23 LAB — CUP PACEART REMOTE DEVICE CHECK
Battery Remaining Longevity: 96 mo
Battery Voltage: 2.99 V
Brady Statistic AP VP Percent: 79.98 %
Brady Statistic AP VS Percent: 0 %
Brady Statistic AS VP Percent: 19.82 %
Brady Statistic AS VS Percent: 0.2 %
Brady Statistic RA Percent Paced: 80.03 %
Brady Statistic RV Percent Paced: 99.8 %
Date Time Interrogation Session: 20201007051739
Implantable Lead Implant Date: 20180409
Implantable Lead Implant Date: 20180409
Implantable Lead Location: 753859
Implantable Lead Location: 753860
Implantable Lead Model: 5076
Implantable Lead Model: 5076
Implantable Pulse Generator Implant Date: 20180409
Lead Channel Impedance Value: 247 Ohm
Lead Channel Impedance Value: 323 Ohm
Lead Channel Impedance Value: 361 Ohm
Lead Channel Impedance Value: 380 Ohm
Lead Channel Pacing Threshold Amplitude: 0.75 V
Lead Channel Pacing Threshold Amplitude: 0.75 V
Lead Channel Pacing Threshold Pulse Width: 0.4 ms
Lead Channel Pacing Threshold Pulse Width: 0.4 ms
Lead Channel Sensing Intrinsic Amplitude: 1.75 mV
Lead Channel Sensing Intrinsic Amplitude: 1.75 mV
Lead Channel Sensing Intrinsic Amplitude: 4.5 mV
Lead Channel Sensing Intrinsic Amplitude: 4.5 mV
Lead Channel Setting Pacing Amplitude: 2 V
Lead Channel Setting Pacing Amplitude: 2.5 V
Lead Channel Setting Pacing Pulse Width: 0.4 ms
Lead Channel Setting Sensing Sensitivity: 2.8 mV

## 2019-06-03 NOTE — Progress Notes (Signed)
Remote pacemaker transmission.   

## 2019-06-10 ENCOUNTER — Other Ambulatory Visit: Payer: Self-pay

## 2019-06-10 ENCOUNTER — Encounter (HOSPITAL_COMMUNITY): Payer: Self-pay | Admitting: Emergency Medicine

## 2019-06-10 ENCOUNTER — Emergency Department (HOSPITAL_COMMUNITY): Payer: Medicare Other

## 2019-06-10 ENCOUNTER — Inpatient Hospital Stay (HOSPITAL_COMMUNITY)
Admission: EM | Admit: 2019-06-10 | Discharge: 2019-06-13 | DRG: 065 | Disposition: A | Discharge status: Home Health | Payer: Medicare Other | Attending: Family Medicine | Admitting: Family Medicine

## 2019-06-10 DIAGNOSIS — I10 Essential (primary) hypertension: Secondary | ICD-10-CM | POA: Diagnosis not present

## 2019-06-10 DIAGNOSIS — Z923 Personal history of irradiation: Secondary | ICD-10-CM

## 2019-06-10 DIAGNOSIS — Z8673 Personal history of transient ischemic attack (TIA), and cerebral infarction without residual deficits: Secondary | ICD-10-CM

## 2019-06-10 DIAGNOSIS — R296 Repeated falls: Secondary | ICD-10-CM

## 2019-06-10 DIAGNOSIS — Z79899 Other long term (current) drug therapy: Secondary | ICD-10-CM

## 2019-06-10 DIAGNOSIS — Z8546 Personal history of malignant neoplasm of prostate: Secondary | ICD-10-CM

## 2019-06-10 DIAGNOSIS — R350 Frequency of micturition: Secondary | ICD-10-CM | POA: Diagnosis present

## 2019-06-10 DIAGNOSIS — Z20828 Contact with and (suspected) exposure to other viral communicable diseases: Secondary | ICD-10-CM | POA: Diagnosis present

## 2019-06-10 DIAGNOSIS — I639 Cerebral infarction, unspecified: Principal | ICD-10-CM

## 2019-06-10 DIAGNOSIS — R300 Dysuria: Secondary | ICD-10-CM | POA: Diagnosis not present

## 2019-06-10 DIAGNOSIS — E7800 Pure hypercholesterolemia, unspecified: Secondary | ICD-10-CM | POA: Diagnosis present

## 2019-06-10 DIAGNOSIS — N184 Chronic kidney disease, stage 4 (severe): Secondary | ICD-10-CM | POA: Diagnosis present

## 2019-06-10 DIAGNOSIS — I480 Paroxysmal atrial fibrillation: Secondary | ICD-10-CM

## 2019-06-10 DIAGNOSIS — Z03818 Encounter for observation for suspected exposure to other biological agents ruled out: Secondary | ICD-10-CM | POA: Diagnosis not present

## 2019-06-10 DIAGNOSIS — R260 Ataxic gait: Secondary | ICD-10-CM | POA: Diagnosis present

## 2019-06-10 DIAGNOSIS — M542 Cervicalgia: Secondary | ICD-10-CM | POA: Diagnosis not present

## 2019-06-10 DIAGNOSIS — R4189 Other symptoms and signs involving cognitive functions and awareness: Secondary | ICD-10-CM | POA: Diagnosis present

## 2019-06-10 DIAGNOSIS — Z952 Presence of prosthetic heart valve: Secondary | ICD-10-CM

## 2019-06-10 DIAGNOSIS — E785 Hyperlipidemia, unspecified: Secondary | ICD-10-CM | POA: Diagnosis present

## 2019-06-10 DIAGNOSIS — R471 Dysarthria and anarthria: Secondary | ICD-10-CM | POA: Diagnosis present

## 2019-06-10 DIAGNOSIS — I700 Atherosclerosis of aorta: Secondary | ICD-10-CM | POA: Diagnosis present

## 2019-06-10 DIAGNOSIS — I6350 Cerebral infarction due to unspecified occlusion or stenosis of unspecified cerebral artery: Secondary | ICD-10-CM

## 2019-06-10 DIAGNOSIS — Z953 Presence of xenogenic heart valve: Secondary | ICD-10-CM

## 2019-06-10 DIAGNOSIS — I442 Atrioventricular block, complete: Secondary | ICD-10-CM | POA: Diagnosis present

## 2019-06-10 DIAGNOSIS — R531 Weakness: Secondary | ICD-10-CM | POA: Diagnosis not present

## 2019-06-10 DIAGNOSIS — Z7982 Long term (current) use of aspirin: Secondary | ICD-10-CM

## 2019-06-10 DIAGNOSIS — I2510 Atherosclerotic heart disease of native coronary artery without angina pectoris: Secondary | ICD-10-CM | POA: Diagnosis present

## 2019-06-10 DIAGNOSIS — S3992XA Unspecified injury of lower back, initial encounter: Secondary | ICD-10-CM | POA: Diagnosis not present

## 2019-06-10 DIAGNOSIS — W19XXXA Unspecified fall, initial encounter: Secondary | ICD-10-CM | POA: Diagnosis present

## 2019-06-10 DIAGNOSIS — R4781 Slurred speech: Secondary | ICD-10-CM | POA: Diagnosis not present

## 2019-06-10 DIAGNOSIS — I429 Cardiomyopathy, unspecified: Secondary | ICD-10-CM | POA: Diagnosis present

## 2019-06-10 DIAGNOSIS — Z950 Presence of cardiac pacemaker: Secondary | ICD-10-CM

## 2019-06-10 DIAGNOSIS — M549 Dorsalgia, unspecified: Secondary | ICD-10-CM | POA: Diagnosis not present

## 2019-06-10 DIAGNOSIS — Z9181 History of falling: Secondary | ICD-10-CM

## 2019-06-10 DIAGNOSIS — F0390 Unspecified dementia without behavioral disturbance: Secondary | ICD-10-CM | POA: Diagnosis present

## 2019-06-10 DIAGNOSIS — I350 Nonrheumatic aortic (valve) stenosis: Secondary | ICD-10-CM | POA: Diagnosis present

## 2019-06-10 DIAGNOSIS — I129 Hypertensive chronic kidney disease with stage 1 through stage 4 chronic kidney disease, or unspecified chronic kidney disease: Secondary | ICD-10-CM | POA: Diagnosis present

## 2019-06-10 DIAGNOSIS — E119 Type 2 diabetes mellitus without complications: Secondary | ICD-10-CM

## 2019-06-10 DIAGNOSIS — Z87891 Personal history of nicotine dependence: Secondary | ICD-10-CM

## 2019-06-10 DIAGNOSIS — R4182 Altered mental status, unspecified: Secondary | ICD-10-CM | POA: Diagnosis not present

## 2019-06-10 DIAGNOSIS — E1122 Type 2 diabetes mellitus with diabetic chronic kidney disease: Secondary | ICD-10-CM | POA: Diagnosis present

## 2019-06-10 DIAGNOSIS — R29702 NIHSS score 2: Secondary | ICD-10-CM | POA: Diagnosis present

## 2019-06-10 DIAGNOSIS — I6523 Occlusion and stenosis of bilateral carotid arteries: Secondary | ICD-10-CM | POA: Diagnosis present

## 2019-06-10 DIAGNOSIS — S79912A Unspecified injury of left hip, initial encounter: Secondary | ICD-10-CM | POA: Diagnosis not present

## 2019-06-10 DIAGNOSIS — R29810 Facial weakness: Secondary | ICD-10-CM | POA: Diagnosis present

## 2019-06-10 DIAGNOSIS — E1151 Type 2 diabetes mellitus with diabetic peripheral angiopathy without gangrene: Secondary | ICD-10-CM | POA: Diagnosis present

## 2019-06-10 LAB — COMPREHENSIVE METABOLIC PANEL
ALT: 17 U/L (ref 0–44)
AST: 47 U/L — ABNORMAL HIGH (ref 15–41)
Albumin: 4.3 g/dL (ref 3.5–5.0)
Alkaline Phosphatase: 62 U/L (ref 38–126)
Anion gap: 10 (ref 5–15)
BUN: 16 mg/dL (ref 8–23)
CO2: 23 mmol/L (ref 22–32)
Calcium: 9.3 mg/dL (ref 8.9–10.3)
Chloride: 107 mmol/L (ref 98–111)
Creatinine, Ser: 1.98 mg/dL — ABNORMAL HIGH (ref 0.61–1.24)
GFR calc Af Amer: 36 mL/min — ABNORMAL LOW (ref >60)
GFR calc non Af Amer: 31 mL/min — ABNORMAL LOW (ref >60)
Glucose, Bld: 122 mg/dL — ABNORMAL HIGH (ref 70–99)
Potassium: 4 mmol/L (ref 3.5–5.1)
Sodium: 140 mmol/L (ref 135–145)
Total Bilirubin: 1.4 mg/dL — ABNORMAL HIGH (ref 0.3–1.2)
Total Protein: 7.2 g/dL (ref 6.5–8.1)

## 2019-06-10 LAB — DIFFERENTIAL
Abs Immature Granulocytes: 0.02 10*3/uL (ref 0.00–0.07)
Basophils Absolute: 0.1 10*3/uL (ref 0.0–0.1)
Basophils Relative: 1 %
Eosinophils Absolute: 0.1 10*3/uL (ref 0.0–0.5)
Eosinophils Relative: 2 %
Immature Granulocytes: 0 %
Lymphocytes Relative: 14 %
Lymphs Abs: 0.8 10*3/uL (ref 0.7–4.0)
Monocytes Absolute: 0.6 10*3/uL (ref 0.1–1.0)
Monocytes Relative: 11 %
Neutro Abs: 4.4 10*3/uL (ref 1.7–7.7)
Neutrophils Relative %: 72 %

## 2019-06-10 LAB — CBC
HCT: 26.9 % — ABNORMAL LOW (ref 39.0–52.0)
Hemoglobin: 8.9 g/dL — ABNORMAL LOW (ref 13.0–17.0)
MCH: 30.2 pg (ref 26.0–34.0)
MCHC: 33.1 g/dL (ref 30.0–36.0)
MCV: 91.2 fL (ref 80.0–100.0)
Platelets: 172 10*3/uL (ref 150–400)
RBC: 2.95 MIL/uL — ABNORMAL LOW (ref 4.22–5.81)
RDW: 14.5 % (ref 11.5–15.5)
WBC: 6 10*3/uL (ref 4.0–10.5)
nRBC: 0 % (ref 0.0–0.2)

## 2019-06-10 LAB — I-STAT CHEM 8, ED
BUN: 19 mg/dL (ref 8–23)
Calcium, Ion: 1.21 mmol/L (ref 1.15–1.40)
Chloride: 106 mmol/L (ref 98–111)
Creatinine, Ser: 1.9 mg/dL — ABNORMAL HIGH (ref 0.61–1.24)
Glucose, Bld: 117 mg/dL — ABNORMAL HIGH (ref 70–99)
HCT: 28 % — ABNORMAL LOW (ref 39.0–52.0)
Hemoglobin: 9.5 g/dL — ABNORMAL LOW (ref 13.0–17.0)
Potassium: 4.2 mmol/L (ref 3.5–5.1)
Sodium: 139 mmol/L (ref 135–145)
TCO2: 23 mmol/L (ref 22–32)

## 2019-06-10 LAB — APTT: aPTT: 33 seconds (ref 24–36)

## 2019-06-10 LAB — PROTIME-INR
INR: 1.2 (ref 0.8–1.2)
Prothrombin Time: 15.4 seconds — ABNORMAL HIGH (ref 11.4–15.2)

## 2019-06-10 MED ORDER — SODIUM CHLORIDE 0.9% FLUSH
3.0000 mL | Freq: Once | INTRAVENOUS | Status: DC
Start: 2019-06-10 — End: 2019-06-13

## 2019-06-10 NOTE — ED Triage Notes (Signed)
Patient arrived with EMS from home family reported slurred speech with increasing dementia/confusion . LSN 3 days ago , history of stroke with slurred speech , patient stated speech is baseline . No arm drift/no facial asymmetry . CBG= 138.

## 2019-06-10 NOTE — ED Notes (Addendum)
[place] in lobby [place] in lobby [phone]

## 2019-06-11 ENCOUNTER — Emergency Department (HOSPITAL_COMMUNITY): Payer: Medicare Other

## 2019-06-11 ENCOUNTER — Observation Stay (HOSPITAL_COMMUNITY): Payer: Medicare Other

## 2019-06-11 DIAGNOSIS — I10 Essential (primary) hypertension: Secondary | ICD-10-CM | POA: Diagnosis not present

## 2019-06-11 DIAGNOSIS — M542 Cervicalgia: Secondary | ICD-10-CM | POA: Diagnosis not present

## 2019-06-11 DIAGNOSIS — G459 Transient cerebral ischemic attack, unspecified: Secondary | ICD-10-CM | POA: Diagnosis not present

## 2019-06-11 DIAGNOSIS — S3992XA Unspecified injury of lower back, initial encounter: Secondary | ICD-10-CM | POA: Diagnosis not present

## 2019-06-11 DIAGNOSIS — I480 Paroxysmal atrial fibrillation: Secondary | ICD-10-CM | POA: Diagnosis not present

## 2019-06-11 DIAGNOSIS — F0390 Unspecified dementia without behavioral disturbance: Secondary | ICD-10-CM | POA: Diagnosis not present

## 2019-06-11 DIAGNOSIS — I639 Cerebral infarction, unspecified: Secondary | ICD-10-CM | POA: Diagnosis not present

## 2019-06-11 DIAGNOSIS — S79912A Unspecified injury of left hip, initial encounter: Secondary | ICD-10-CM | POA: Diagnosis not present

## 2019-06-11 DIAGNOSIS — M549 Dorsalgia, unspecified: Secondary | ICD-10-CM | POA: Diagnosis not present

## 2019-06-11 LAB — URINALYSIS, ROUTINE W REFLEX MICROSCOPIC
Bilirubin Urine: NEGATIVE
Glucose, UA: NEGATIVE mg/dL
Ketones, ur: NEGATIVE mg/dL
Leukocytes,Ua: NEGATIVE
Nitrite: NEGATIVE
Protein, ur: 100 mg/dL — AB
Specific Gravity, Urine: 1.011 (ref 1.005–1.030)
pH: 5 (ref 5.0–8.0)

## 2019-06-11 LAB — SARS CORONAVIRUS 2 (TAT 6-24 HRS): SARS Coronavirus 2: NEGATIVE

## 2019-06-11 LAB — TROPONIN I (HIGH SENSITIVITY)
Troponin I (High Sensitivity): 20 ng/L — ABNORMAL HIGH (ref <18)
Troponin I (High Sensitivity): 24 ng/L — ABNORMAL HIGH (ref <18)

## 2019-06-11 MED ORDER — SODIUM CHLORIDE 0.9 % IV SOLN
INTRAVENOUS | Status: DC
  Administered 2019-06-11 – 2019-06-12 (×2): via INTRAVENOUS

## 2019-06-11 MED ORDER — SENNOSIDES-DOCUSATE SODIUM 8.6-50 MG PO TABS
1.0000 | ORAL_TABLET | Freq: Every day | ORAL | Status: DC
  Administered 2019-06-11: 1 via ORAL
  Filled 2019-06-11: qty 1

## 2019-06-11 MED ORDER — AMLODIPINE BESYLATE 10 MG PO TABS: 10.0000 mg | ORAL_TABLET | Freq: Every day | ORAL | Status: DC

## 2019-06-11 MED ORDER — CARVEDILOL 6.25 MG PO TABS
6.2500 mg | ORAL_TABLET | Freq: Two times a day (BID) | ORAL | Status: DC
  Administered 2019-06-11 – 2019-06-13 (×4): 6.25 mg via ORAL
  Filled 2019-06-11 (×4): qty 1

## 2019-06-11 MED ORDER — ACETAMINOPHEN 650 MG RE SUPP: 650.0000 mg | RECTAL | Status: DC | PRN

## 2019-06-11 MED ORDER — ASPIRIN EC 81 MG PO TBEC
81.0000 mg | DELAYED_RELEASE_TABLET | Freq: Every day | ORAL | Status: DC
  Administered 2019-06-12 – 2019-06-13 (×2): 81 mg via ORAL
  Filled 2019-06-11 (×2): qty 1

## 2019-06-11 MED ORDER — ACETAMINOPHEN 160 MG/5ML PO SOLN: 650.0000 mg | ORAL | Status: DC | PRN

## 2019-06-11 MED ORDER — LOSARTAN POTASSIUM 50 MG PO TABS: 100.0000 mg | ORAL_TABLET | Freq: Every day | ORAL | Status: DC

## 2019-06-11 MED ORDER — TETRAHYDROZOLINE HCL 0.05 % OP SOLN
1.0000 [drp] | Freq: Every day | OPHTHALMIC | Status: DC | PRN
  Filled 2019-06-11: qty 15

## 2019-06-11 MED ORDER — ENOXAPARIN SODIUM 30 MG/0.3ML ~~LOC~~ SOLN: 30.0000 mg | SUBCUTANEOUS | Status: DC

## 2019-06-11 MED ORDER — STROKE: EARLY STAGES OF RECOVERY BOOK: Freq: Once | Status: DC

## 2019-06-11 MED ORDER — ACETAMINOPHEN 325 MG PO TABS: 650.0000 mg | ORAL_TABLET | ORAL | Status: DC | PRN

## 2019-06-11 MED ORDER — FOLIC ACID 1 MG PO TABS
3.0000 mg | ORAL_TABLET | Freq: Every day | ORAL | Status: DC
  Administered 2019-06-12 – 2019-06-13 (×2): 3 mg via ORAL
  Filled 2019-06-11 (×2): qty 3

## 2019-06-11 NOTE — ED Notes (Signed)
Attempted to give report and was told pt is busy and will call back

## 2019-06-11 NOTE — ED Notes (Signed)
Report given to [facility], RN on 3W

## 2019-06-11 NOTE — ED Notes (Signed)
James Curtis (son & power of attorney)- is going home. Would like to be updated. Number is in chart.

## 2019-06-11 NOTE — ED Notes (Signed)
ED TO INPATIENT HANDOFF REPORT  ED Nurse Name and Phone #: Lorrin Goodell [phone]  S Name/Age/Gender James Curtis 78 y.o. male Room/Bed: 019C/019C  Code Status   Code Status: Full Code  Home/SNF/Other Nursing Home Patient oriented to: self, place, time and situation Is this baseline? Yes   Triage Complete: Triage complete  Chief Complaint Speach issues, excessive urin  Triage Note Patient arrived with EMS from home family reported slurred speech with increasing dementia/confusion . LSN 3 days ago , history of stroke with slurred speech , patient stated speech is baseline . No arm drift/no facial asymmetry . CBG= 138.    Allergies No Known Allergies  Level of Care/Admitting Diagnosis ED Disposition    ED Disposition Condition [facility] Area: [place] [[id number]]  Level of Care: Telemetry Medical [104]  I expect the patient will be discharged within 24 hours: No (not a candidate for 5C-Observation unit)  Covid Evaluation: Asymptomatic Screening Protocol (No Symptoms)  Diagnosis: TIA (transient ischemic attack) [443154]  Admitting Physician: Neena Rhymes [[id number]]  Attending Physician: Adella Hare E [[id number]]  PT Class (Do Not Modify): Observation [104]  PT Acc Code (Do Not Modify): Observation [10022]       B Medical/Surgery History Past Medical History:  Diagnosis Date  . Aortic stenosis    a. 11/20/2016 Echo: mod AS;  b. 11/23/2016 TEE restricted mobility of noncoronary cusp;  c. 11/2016 Cath: mean grad 60mHg w/ dual lumen catheter, AVA 2.0cm^2; mean gradient by pullback = 135mg, AVA 2.3cm^2.  . Cardiomyopathy ([facility]   Echo 6/18: EF 40-45, inf/inf-septal HK-AK, Gr 1 DD, severe LVH, MAC, severe LAE, mildly reduced RVSF  . Carotid arterial disease ([facility]   a. 11/2016 Carotid U/S: bilat 1-39% ICA stenosis.  . Chronic kidney disease   . Complete heart block ([place]   a. 11/21/2016 s/p MDT AzSantina EvansR MRI SureScan (ser # [id number]).  .  Dementia ([facility] [place]   mild  . Dementia ([facility]  . Family history of adverse reaction to anesthesia    Son [name] a difficult time waking up  . Heart murmur   . Hemolytic anemia (HCC[place]  . Hypercholesteremia   . Hypertension    benign  . Incidental pulmonary nodule, greater than or equal to 73m1073m needs f/u scan 3 months 11/28/2016   14 x 8 mm (mean diameter of 11 mm) left lower lobe pulmonary nodule with some surrounding ground-glass attenuation (axial image 42 of series 6). 9 x 5 mm (mean diameter of 7 mm) subpleural nodule in the periphery of the right upper lobe (axial image 20 of series 6). A few patchy areas of peripheral predominant ground-glass attenuation and subpleural reticulation are noted, scattered randomly throu  . Left atrial mass   . 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 11/2009 TEE: EF 60-65%, restricted mobility of noncoronary AoV cusp, 16x4 mm mobile Ca2+ echodensity on the atrial basal surface of the posterior MV leaflet, mild MR, no LA/LAA thrombus.  . Non-obstructive CAD (coronary artery disease)    a. 11/2016 Cath: LM large, nl, LAD min irregs, LCX large, min irregs, OM3 40, RCA large, 76m30m. Paravalvular leak (prosthetic valve)    Echo 9/19: Severe concentric LVH, EF 55-60, normal wall motion, grade 2 diastolic dysfunction, bioprosthetic AVR with mild to moderate perivalvular regurgitation, moderate to severely calcified mitral valve annulus, severe LAE, mild to moderate TR, PASP 42  .  Paravalvular leak (prosthetic valve), subsequent encounter   . Pre-diabetes   . Prostate cancer [facility])    s/p radiation  . Renal mass of unknown nature - needs f/u MRI 11/28/2016   Multiple renal lesions are noted in the kidneys bilaterally. Although several of these lesions are low-attenuation, compatible with cysts, other lesions are intermediate to high attenuation, potentially enhancing  . S/P aortic valve replacement with bioprosthetic valve 12/02/2016   23 mm Edwards Intuity  rapid deployment bovine pericardial tissue valve  . S/P resection of left atrial mass 12/02/2016   Benign dystrophic fibrotic tissue with calcification attached to posterior mitral annulus by thin fibrotic stalk  . Stroke ([place])   . TIA (transient ischemic attack)    a. 11/2016 in setting of complete heart block.   Past Surgical History:  Procedure Laterality Date  . AORTIC VALVE REPLACEMENT N/A 12/02/2016   Procedure: AORTIC VALVE REPLACEMENT (AVR) using a 23 Edwards Intuity Elite Aortic Valve;  Surgeon: Rexene Alberts, MD;  Location: [facility];  Service: Open Heart Surgery;  Laterality: N/A;  . EXCISION OF ATRIAL MYXOMA N/A 12/02/2016   Procedure: Resection of left atrial mass ;  Surgeon: Rexene Alberts, MD;  Location: [facility];  Service: Open Heart Surgery;  Laterality: N/A;  . PACEMAKER IMPLANT N/A 11/21/2016   Procedure: Pacemaker Implant;  Surgeon: Will [name], MD;  Location: [place] CV LAB;  Service: Cardiovascular;  Laterality: N/A;  . RIGHT/LEFT HEART CATH AND CORONARY ANGIOGRAPHY N/A 11/25/2016   Procedure: Right/Left Heart Cath and Coronary Angiography;  Surgeon: Nelva Bush, MD;  Location: [place] CV LAB;  Service: Cardiovascular;  Laterality: N/A;  . TEE WITHOUT CARDIOVERSION N/A 11/23/2016   Procedure: TRANSESOPHAGEAL ECHOCARDIOGRAM (TEE);  Surgeon: Jerline Pain, MD;  Location: [facility];  Service: Cardiovascular;  Laterality: N/A;  . TEE WITHOUT CARDIOVERSION N/A 12/02/2016   Procedure: TRANSESOPHAGEAL ECHOCARDIOGRAM (TEE);  Surgeon: Rexene Alberts, MD;  Location: [facility];  Service: Open Heart Surgery;  Laterality: N/A;  . TEE WITHOUT CARDIOVERSION N/A 08/22/2018   Procedure: TRANSESOPHAGEAL ECHOCARDIOGRAM (TEE);  Surgeon: Josue Hector, MD;  Location: [facility] ENDOSCOPY;  Service: Cardiovascular;  Laterality: N/A;     A IV Location/Drains/Wounds Patient Lines/Drains/Airways Status   Active Line/Drains/Airways    Name:   Placement date:   Placement time:   Site:   Days:    Peripheral IV 06/10/19 Left Antecubital   06/10/19    -    Antecubital   1          Intake/Output Last 24 hours No intake or output data in the 24 hours ending 06/11/19 1641  Labs/Imaging Results for orders placed or performed during the hospital encounter of 06/10/19 (from the past 48 hour(s))  Protime-INR     Status: Abnormal   Collection Time: 06/10/19 10:16 PM  Result Value Ref Range   Prothrombin Time 15.4 (H) 11.4 - 15.2 seconds   INR 1.2 0.8 - 1.2    Comment: (NOTE) INR goal varies based on device and disease states. Performed at [facility], [place] [address] [place], [place] [address]   APTT     Status: None   Collection Time: 06/10/19 10:16 PM  Result Value Ref Range   aPTT 33 24 - 36 seconds    Comment: Performed at [facility] [address], , [place] [address]  CBC     Status: Abnormal   Collection Time: 06/10/19 10:16 PM  Result Value Ref Range  WBC 6.0 4.0 - 10.5 K/uL   RBC 2.95 (L) 4.22 - 5.81 MIL/uL   Hemoglobin 8.9 (L) 13.0 - 17.0 g/dL   HCT 26.9 (L) 39.0 - 52.0 %   MCV 91.2 80.0 - 100.0 fL   MCH 30.2 26.0 - 34.0 pg   MCHC 33.1 30.0 - 36.0 g/dL   RDW 14.5 11.5 - 15.5 %   Platelets 172 150 - 400 K/uL   nRBC 0.0 0.0 - 0.2 %    Comment: Performed at [facility], [place] [address], [place], [place] [address]  Differential     Status: None   Collection Time: 06/10/19 10:16 PM  Result Value Ref Range   Neutrophils Relative % 72 %   Neutro Abs 4.4 1.7 - 7.7 K/uL   Lymphocytes Relative 14 %   Lymphs Abs 0.8 0.7 - 4.0 K/uL   Monocytes Relative 11 %   Monocytes Absolute 0.6 0.1 - 1.0 K/uL   Eosinophils Relative 2 %   Eosinophils Absolute 0.1 0.0 - 0.5 K/uL   Basophils Relative 1 %   Basophils Absolute 0.1 0.0 - 0.1 K/uL   Immature Granulocytes 0 %   Abs Immature Granulocytes 0.02 0.00 - 0.07 K/uL    Comment: Performed at [facility] [address] [place], [place] [address]  Comprehensive metabolic panel     Status:  Abnormal   Collection Time: 06/10/19 10:16 PM  Result Value Ref Range   Sodium 140 135 - 145 mmol/L   Potassium 4.0 3.5 - 5.1 mmol/L   Chloride 107 98 - 111 mmol/L   CO2 23 22 - 32 mmol/L   Glucose, Bld 122 (H) 70 - 99 mg/dL   BUN 16 8 - 23 mg/dL   Creatinine, Ser 1.98 (H) 0.61 - 1.24 mg/dL   Calcium 9.3 8.9 - 10.3 mg/dL   Total Protein 7.2 6.5 - 8.1 g/dL   Albumin 4.3 3.5 - 5.0 g/dL   AST 47 (H) 15 - 41 U/L   ALT 17 0 - 44 U/L   Alkaline Phosphatase 62 38 - 126 U/L   Total Bilirubin 1.4 (H) 0.3 - 1.2 mg/dL   GFR calc non Af Amer 31 (L) >60 mL/min   GFR calc Af Amer 36 (L) >60 mL/min   Anion gap 10 5 - 15    Comment: Performed at [facility] [address], [place], [place] [address]  I-stat chem 8, ED     Status: Abnormal   Collection Time: 06/10/19 10:47 PM  Result Value Ref Range   Sodium 139 135 - 145 mmol/L   Potassium 4.2 3.5 - 5.1 mmol/L   Chloride 106 98 - 111 mmol/L   BUN 19 8 - 23 mg/dL   Creatinine, Ser 1.90 (H) 0.61 - 1.24 mg/dL   Glucose, Bld 117 (H) 70 - 99 mg/dL   Calcium, Ion 1.21 1.15 - 1.40 mmol/L   TCO2 23 22 - 32 mmol/L   Hemoglobin 9.5 (L) 13.0 - 17.0 g/dL   HCT 28.0 (L) 39.0 - 52.0 %  Urinalysis, Routine w reflex microscopic     Status: Abnormal   Collection Time: 06/11/19  9:01 AM  Result Value Ref Range   Color, Urine YELLOW YELLOW   APPearance CLEAR CLEAR   Specific Gravity, Urine 1.011 1.005 - 1.030   pH 5.0 5.0 - 8.0   Glucose, UA NEGATIVE NEGATIVE mg/dL   Hgb urine dipstick MODERATE (A) NEGATIVE   Bilirubin Urine NEGATIVE NEGATIVE  Ketones, ur NEGATIVE NEGATIVE mg/dL   Protein, ur 100 (A) NEGATIVE mg/dL   Nitrite NEGATIVE NEGATIVE   Leukocytes,Ua NEGATIVE NEGATIVE   RBC / HPF 0-5 0 - 5 RBC/hpf   WBC, UA 0-5 0 - 5 WBC/hpf   Bacteria, UA RARE (A) NONE SEEN   Hyaline Casts, UA PRESENT     Comment: Performed at [facility] [address] [place], [place] [address]  Troponin I (High Sensitivity)     Status: Abnormal    Collection Time: 06/11/19  9:01 AM  Result Value Ref Range   Troponin I (High Sensitivity) 20 (H) <18 ng/L    Comment: (NOTE) Elevated high sensitivity troponin I (hsTnI) values and significant  changes across serial measurements may suggest ACS but many other  chronic and acute conditions are known to elevate hsTnI results.  Refer to the "Links" section for chest pain algorithms and additional  guidance. Performed at [facility], [place] [address] [place], [place] [address]   Troponin I (High Sensitivity)     Status: Abnormal   Collection Time: 06/11/19 12:00 PM  Result Value Ref Range   Troponin I (High Sensitivity) 24 (H) <18 ng/L    Comment: (NOTE) Elevated high sensitivity troponin I (hsTnI) values and significant  changes across serial measurements may suggest ACS but many other  chronic and acute conditions are known to elevate hsTnI results.  Refer to the "Links" section for chest pain algorithms and additional  guidance. Performed at [facility], [place] [address] [place], [place] [address]    Dg Cervical Spine 2-3 Views  Result Date: 06/11/2019 CLINICAL DATA:  Posterior neck pain after fall EXAM: CERVICAL SPINE - 2-3 VIEW COMPARISON:  None. FINDINGS: Limited exam on lateral view secondary to overlapping structures/patient positioning. Straightening of the cervical lordosis. No fracture visualized. Dens and lateral masses appear aligned. Multilevel intervertebral disc height loss. No definite prevertebral soft tissue swelling, although evaluation is somewhat limited. IMPRESSION: Limited exam. No obvious fracture or malalignment of the cervical spine. If high clinical suspicion for fracture remains, CT should be considered. Electronically Signed   By: Davina Poke M.D.   On: 06/11/2019 10:16   Dg Lumbar Spine 2-3 Views  Result Date: 06/11/2019 CLINICAL DATA:  Back pain after fall EXAM: LUMBAR SPINE - 2-3 VIEW COMPARISON:  None. FINDINGS: Five lumbar type  vertebral segments. Vertebral body heights and alignment are maintained. No fracture identified. Intervertebral disc spaces are relatively preserved. Minimal degenerative endplate changes. Mild lower lumbar facet arthrosis. Degenerative change of the right SI joint. Abdominal aortic atherosclerotic calcifications. IMPRESSION: No acute osseous abnormality of the lumbar spine. Electronically Signed   By: Davina Poke M.D.   On: 06/11/2019 10:14   Ct Head Wo Contrast  Result Date: 06/10/2019 CLINICAL DATA:  Slurred speech and altered mental status EXAM: CT HEAD WITHOUT CONTRAST TECHNIQUE: Contiguous axial images were obtained from the base of the skull through the vertex without intravenous contrast. COMPARISON:  None. FINDINGS: Brain: There is no mass, hemorrhage or extra-axial collection. The size and configuration of the ventricles and extra-axial CSF spaces are normal. There is hypoattenuation of the white matter, most commonly indicating chronic small vessel disease. Vascular: No abnormal hyperdensity of the major intracranial arteries or dural venous sinuses. No intracranial atherosclerosis. Skull: The visualized skull base, calvarium and extracranial soft tissues are normal. Sinuses/Orbits: No fluid levels or advanced mucosal thickening of the visualized paranasal sinuses. No mastoid or middle ear effusion. The orbits  are normal. IMPRESSION: Chronic small vessel disease without acute intracranial abnormality. Electronically Signed   By: Ulyses Jarred M.D.   On: 06/10/2019 23:18   Mr Brain Wo Contrast  Result Date: 06/11/2019 CLINICAL DATA:  TIA.  Slurred speech.  Altered mental status. EXAM: MRI HEAD WITHOUT CONTRAST TECHNIQUE: Multiplanar, multiecho pulse sequences of the brain and surrounding structures were obtained without intravenous contrast. COMPARISON:  Head CT yesterday. FINDINGS: Brain: Diffusion imaging shows acute infarction affecting the right para median pons. No other acute finding.  Few old small vessel infarctions affecting the pons. Cerebral hemispheres show age related atrophy with mild to moderate chronic small-vessel ischemic change of the deep white matter. No cortical infarction. No mass, hemorrhage, hydrocephalus or extra-axial collection. Vascular: Major vessels at the base of the brain show flow. Skull and upper cervical spine: Negative Sinuses/Orbits: Clear/normal Other: None IMPRESSION: Acute infarction affecting the right para median pons. No swelling or hemorrhage. Mild to moderate chronic small-vessel ischemic changes elsewhere affecting the pons and the cerebral hemispheric white matter. Electronically Signed   By: Nelson Chimes M.D.   On: 06/11/2019 15:55   Dg Hip Unilat W Or Wo Pelvis 2-3 Views Left  Result Date: 06/11/2019 CLINICAL DATA:  Fall.  Back pain. EXAM: DG HIP (WITH OR WITHOUT PELVIS) 2-3V LEFT COMPARISON:  No recent. FINDINGS: Degenerative change lumbar spine and both hips. No acute bony abnormality. Peripheral vascular calcification. IMPRESSION: 1. Degenerative changes lumbar spine and both hips. No acute abnormality. 2.  Peripheral vascular disease. Electronically Signed   By: Marcello Moores  Register   On: 06/11/2019 10:12    Pending Labs Unresulted Labs (From admission, onward)    Start     Ordered   06/18/19 0500  Creatinine, serum  (enoxaparin (LOVENOX)    CrCl < 30 ml/min)  Weekly,   R    Comments: while on enoxaparin therapy.    06/11/19 1338   06/12/19 0500  Hemoglobin A1c  Tomorrow morning,   R     06/11/19 1337   06/11/19 1203  SARS CORONAVIRUS 2 (TAT 6-24 HRS) Nasopharyngeal Nasopharyngeal Swab  (Asymptomatic/Tier 2 Patients Labs)  Once,   STAT    Question Answer Comment  Is this test for diagnosis or screening Screening   Symptomatic for COVID-19 as defined by CDC No   Hospitalized for COVID-19 No   Admitted to ICU for COVID-19 No   Previously tested for COVID-19 No   Resident in a congregate (group) care setting No   Employed in  healthcare setting No      06/11/19 1202   06/11/19 1201  Urine Culture  Once,   R     06/11/19 1201          Vitals/Pain Today's Vitals   06/11/19 1215 06/11/19 1230 06/11/19 1301 06/11/19 1333  BP:  136/73 (!) 145/77   Pulse: 73  77   Resp: (!) 23  (!) 25   Temp:      TempSrc:      SpO2: 97%  96% 96%  PainSc:        Isolation Precautions No active isolations  Medications Medications  sodium chloride flush (NS) 0.9 % injection 3 mL (has no administration in time range)  aspirin EC tablet 81 mg (has no administration in time range)  amLODipine (NORVASC) tablet 10 mg (has no administration in time range)  carvedilol (COREG) tablet 6.25 mg (has no administration in time range)  losartan (COZAAR) tablet 100 mg (has no administration in time  range)  folic acid (FOLVITE) tablet 3 mg (has no administration in time range)  tetrahydrozoline 0.05 % ophthalmic solution 1 drop (has no administration in time range)   stroke: mapping our early stages of recovery book (has no administration in time range)  0.9 %  sodium chloride infusion (has no administration in time range)  acetaminophen (TYLENOL) tablet 650 mg (has no administration in time range)    Or  acetaminophen (TYLENOL) 160 MG/5ML solution 650 mg (has no administration in time range)    Or  acetaminophen (TYLENOL) suppository 650 mg (has no administration in time range)  senna-docusate (Senokot-S) tablet 1 tablet (has no administration in time range)  enoxaparin (LOVENOX) injection 30 mg (has no administration in time range)    Mobility walks with device Low fall risk   Focused Assessments Neuro Assessment Handoff:  Swallow screen pass? Yes  Cardiac Rhythm: Other (Comment)(v-paced) NIH Stroke Scale ( + Modified Stroke Scale Criteria)  Interval: Initial Level of Consciousness (1a.)   : Alert, keenly responsive LOC Questions (1b. )   +: Answers one question correctly LOC Commands (1c. )   + : Performs both tasks  correctly Best Gaze (2. )  +: Normal Visual (3. )  +: No visual loss Facial Palsy (4. )    : Minor paralysis Motor Arm, Left (5a. )   +: No drift Motor Arm, Right (5b. )   +: No drift Motor Leg, Left (6a. )   +: No drift Motor Leg, Right (6b. )   +: No drift Limb Ataxia (7. ): Absent Sensory (8. )   +: Mild-to-moderate sensory loss, patient feels pinprick is less sharp or is dull on the affected side, or there is a loss of superficial pain with pinprick, but patient is aware of being touched Best Language (9. )   +: Mild-to-moderate aphasia Dysarthria (10. ): Mild-to-moderate dysarthria, patient slurs at least some words and, at worst, can be understood with some difficulty Extinction/Inattention (11.)   +: No Abnormality Modified SS Total  +: 3 Complete NIHSS TOTAL: 5     Neuro Assessment: Exceptions to WDL Neuro Checks:   Initial (06/11/19 0816)  Last Documented NIHSS Modified Score: 3 (06/11/19 0816) Has TPA been given? No If patient is a Neuro Trauma and patient is going to OR before floor call report to [place] nurse: [phone] or [phone]     R Recommendations: See Admitting Provider Note  Report given to:   Additional Notes:

## 2019-06-11 NOTE — ED Notes (Signed)
Patient transported to MRI 

## 2019-06-11 NOTE — H&P (Signed)
History and Physical    James Curtis WNI:[id number] DOB: 07/07/1941 DOA: 06/10/2019  PCP: [name], MD (Confirm with patient/family/NH records and if not entered, this has to be entered at [facility] point of entry) Patient coming from: Coming from home  I have personally briefly reviewed patient's old medical records in [place]  Chief Complaint: fall and speech change  HPI: James Curtis is a 77 y.o. male with medical history significant of complete heart block, s/p bioprosthetic AoR, PAF who has previously declined long term anti-coagulation. Over the 24 hrs prior to admission the patient had a fall while struggling to get out of bed to the bathroom - a new problem. He had a second fall which he describes as the floor seemed to be moving and he could not maintain his balance and went down. He was awake and recalls hitting the floor with his buttock. He also struck his knee. His son was called to assist him and noticed that his speech pattern was changed: slurred words, slower speech and cognitive change. Due to these symptoms the son called EMS and he was transported to [facility]  ED Course: Hemodynamically stable,labs with normal WBC, stable chronic anemia, stable CKD IV with Creatiine 1.90, GFR 31, CT head negative, hip and back films negative. He had a non-focal neuro exam. He is referred to [facility] for observation to r/o TIA, MRI ordered and pending, and assess safety to return home.  Review of Systems: As per HPI otherwise 10 point review of systems negative.    Past Medical History:  Diagnosis Date  . Aortic stenosis    a. 11/20/2016 Echo: mod AS;  b. 11/23/2016 TEE restricted mobility of noncoronary cusp;  c. 11/2016 Cath: mean grad 62mHg w/ dual lumen catheter, AVA 2.0cm^2; mean gradient by pullback = 115mg, AVA 2.3cm^2.  . Cardiomyopathy ([place]   Echo 6/18: EF 40-45, inf/inf-septal HK-AK, Gr 1 DD, severe LVH, MAC, severe LAE, mildly reduced RVSF  . Carotid arterial disease ([facility]    a. 11/2016 Carotid U/S: bilat 1-39% ICA stenosis.  . Chronic kidney disease   . Complete heart block ([place]   a. 11/21/2016 s/p MDT AzSantina EvansR MRI SureScan (ser # [id number]).  . Dementia ([facility]   mild  . Dementia ([place]  . Family history of adverse reaction to anesthesia    Son [name] a difficult time waking up  . Heart murmur   . Hemolytic anemia ([facility]  . Hypercholesteremia   . Hypertension    benign  . Incidental pulmonary nodule, greater than or equal to 60m56m needs f/u scan 3 months 11/28/2016   14 x 8 mm (mean diameter of 11 mm) left lower lobe pulmonary nodule with some surrounding ground-glass attenuation (axial image 42 of series 6). 9 x 5 mm (mean diameter of 7 mm) subpleural nodule in the periphery of the right upper lobe (axial image 20 of series 6). A few patchy areas of peripheral predominant ground-glass attenuation and subpleural reticulation are noted, scattered randomly throu  . Left atrial mass   . 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 11/2009 TEE: EF 60-65%, restricted mobility of noncoronary AoV cusp, 16x4 mm mobile Ca2+ echodensity on the atrial basal surface of the posterior MV leaflet, mild MR, no LA/LAA thrombus.  . Non-obstructive CAD (coronary artery disease)    a. 11/2016 Cath: LM large, nl, LAD min irregs, LCX large, min irregs, OM3 40, RCA large, 31m72m.  Paravalvular leak (prosthetic valve)    Echo 9/19: Severe concentric LVH, EF 55-60, normal wall motion, grade 2 diastolic dysfunction, bioprosthetic AVR with mild to moderate perivalvular regurgitation, moderate to severely calcified mitral valve annulus, severe LAE, mild to moderate TR, PASP 42  . Paravalvular leak (prosthetic valve), subsequent encounter   . Pre-diabetes   . Prostate cancer [facility])    s/p radiation  . Renal mass of unknown nature - needs f/u MRI 11/28/2016   Multiple renal lesions are noted in the kidneys bilaterally. Although several of these lesions are low-attenuation,  compatible with cysts, other lesions are intermediate to high attenuation, potentially enhancing  . S/P aortic valve replacement with bioprosthetic valve 12/02/2016   23 mm Edwards Intuity rapid deployment bovine pericardial tissue valve  . S/P resection of left atrial mass 12/02/2016   Benign dystrophic fibrotic tissue with calcification attached to posterior mitral annulus by thin fibrotic stalk  . Stroke ([place])   . TIA (transient ischemic attack)    a. 11/2016 in setting of complete heart block.    Past Surgical History:  Procedure Laterality Date  . AORTIC VALVE REPLACEMENT N/A 12/02/2016   Procedure: AORTIC VALVE REPLACEMENT (AVR) using a 23 Edwards Intuity Elite Aortic Valve;  Surgeon: Rexene Alberts, MD;  Location: [place];  Service: Open Heart Surgery;  Laterality: N/A;  . EXCISION OF ATRIAL MYXOMA N/A 12/02/2016   Procedure: Resection of left atrial mass ;  Surgeon: Rexene Alberts, MD;  Location: [place];  Service: Open Heart Surgery;  Laterality: N/A;  . PACEMAKER IMPLANT N/A 11/21/2016   Procedure: Pacemaker Implant;  Surgeon: Will [name], MD;  Location: [place] CV LAB;  Service: Cardiovascular;  Laterality: N/A;  . RIGHT/LEFT HEART CATH AND CORONARY ANGIOGRAPHY N/A 11/25/2016   Procedure: Right/Left Heart Cath and Coronary Angiography;  Surgeon: Nelva Bush, MD;  Location: [place] CV LAB;  Service: Cardiovascular;  Laterality: N/A;  . TEE WITHOUT CARDIOVERSION N/A 11/23/2016   Procedure: TRANSESOPHAGEAL ECHOCARDIOGRAM (TEE);  Surgeon: Jerline Pain, MD;  Location: [place];  Service: Cardiovascular;  Laterality: N/A;  . TEE WITHOUT CARDIOVERSION N/A 12/02/2016   Procedure: TRANSESOPHAGEAL ECHOCARDIOGRAM (TEE);  Surgeon: Rexene Alberts, MD;  Location: [place];  Service: Open Heart Surgery;  Laterality: N/A;  . TEE WITHOUT CARDIOVERSION N/A 08/22/2018   Procedure: TRANSESOPHAGEAL ECHOCARDIOGRAM (TEE);  Surgeon: Josue Hector, MD;  Location[facility] ENDOSCOPY;  Service:  Cardiovascular;  Laterality: N/A;   Soc Hx -  HSG, Married for approximately 40 years and widowed. Two sons: one in [place]. ) who is POA, one son in [place], several grandchildren. 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 Moved to [place] approx 10 years ago.   reports that he quit smoking about 2 years ago. His smoking use included cigarettes. He smoked 0.25 packs per day. He has never used smokeless tobacco. He reports that he does not drink alcohol or use drugs.  No Known Allergies  Family History  Problem Relation Age of Onset  . Cancer Father      Prior to Admission medications   Medication Sig Start Date End Date Taking? Authorizing Provider  amLODipine (NORVASC) 10 MG tablet Take 10 mg by mouth daily. 10/25/16   [provider]  amoxicillin (AMOXIL) 500 MG tablet Take 4 tablets (2 grams) by mouth 30-60 minutes prior to dental visit. 08/24/17   End, [name], MD  aspirin EC 81 MG  tablet Take 81 mg by mouth daily.    [provider]  Capsicum, Cayenne, (CAYENNE PEPPER PO) Take by mouth. Takes 1 tablet po as directed. This is a complex tablet along with ginko biloba    [provider]  carvedilol (COREG) 6.25 MG tablet TAKE 1 TABLET BY MOUTH TWICE A DAY 05/28/18   End, [name], MD  cholecalciferol (VITAMIN D) 1000 units tablet Take 1,000 Units by mouth daily.    [provider]  folic acid (FOLVITE) 1 MG tablet TAKE 3 TABLETS BY MOUTH DAILY 12/17/18   Nicholas Lose, MD  Ginkgo Biloba 40 MG TABS Take by mouth. Unsure dose. Complex tablet along with cayenne pepper. Takes as directed.    [provider]  losartan (COZAAR) 100 MG tablet Take 1 tablet (100 mg total) by mouth daily. 03/29/17 10/10/18  [name] T, PA-C  Omega-3 Fatty Acids (OMEGA 3 PO) Take by mouth daily.    [provider]  Darlina Rumpf POWD by Does not apply route as directed. BID    [provider]  Tetrahydrozoline HCl (VISINE OP) Apply to eye as directed.    [provider]    Physical Exam: Vitals:   06/11/19 1200 06/11/19 1215 06/11/19 1230 06/11/19 1301  BP: (!) 158/97  136/73 (!) 145/77  Pulse: 76 73  77  Resp:  (!) 23  (!) 25  Temp:      TempSrc:      SpO2: 99% 97%  96%    Constitutional: NAD, calm, comfortable Vitals:   06/11/19 1200 06/11/19 1215 06/11/19 1230 06/11/19 1301  BP: (!) 158/97  136/73 (!) 145/77  Pulse: 76 73  77  Resp:  (!) 23  (!) 25  Temp:      TempSrc:      SpO2: 99% 97%  96%   General appearance Heavy set elderly man in no distress Eyes: PERRL, lids and conjunctivae normal ENMT: Mucous membranes are moist. Posterior pharynx clear of any exudate or lesions.  Neck: normal, supple, no masses, no thyromegaly Respiratory: clear to auscultation bilaterally, no wheezing, no crackles. Normal respiratory effort. No accessory muscle use.  Cardiovascular: Regular rate and rhythm. III/VI mm RSB,II/VI mm at apex. Pacer Left upper chest. No JVD,  Abdomen: no tenderness, no masses palpated. No hepatosplenomegaly. Bowel sounds positive.  Musculoskeletal: no clubbing / cyanosis. No joint deformity upper and lower extremities. Good ROM, no contractures. Normal muscle tone.  Skin: no rashes, lesions, ulcers. No induration Neurologic: CN 2-12 grossly intact with no facial droop but very mild flattening right nasolabial fold. Speech thick, words slurred. MAE to command. Strength - nl grip and UE strength, nl LE strength. DTR patellar tendon 1+. Normal sensation to light touch. No dysdiadochokinesia. Did not stand or ambulate  Psychiatric: Normal judgment and insight. Alert and oriented x 3. Memory not tested.  Normal mood.     Labs on Admission: I have personally reviewed following labs and imaging studies  CBC: Recent Labs  Lab 06/10/19 2216 06/10/19 2247  WBC 6.0  --   NEUTROABS 4.4  --   HGB 8.9* 9.5*  HCT 26.9* 28.0*  MCV 91.2  --   PLT  172  --    Basic Metabolic Panel: Recent Labs  Lab 06/10/19 2216 06/10/19 2247  NA 140 139  K 4.0 4.2  CL 107 106  CO2 23  --   GLUCOSE 122* 117*  BUN 16 19  CREATININE 1.98* 1.90*  CALCIUM 9.3  --  GFR: CrCl cannot be calculated (Unknown ideal weight.). Liver Function Tests: Recent Labs  Lab 06/10/19 2216  AST 47*  ALT 17  ALKPHOS 62  BILITOT 1.4*  PROT 7.2  ALBUMIN 4.3   No results for input(s): LIPASE, AMYLASE in the last 168 hours. No results for input(s): AMMONIA in the last 168 hours. Coagulation Profile: Recent Labs  Lab 06/10/19 2216  INR 1.2   Cardiac Enzymes: No results for input(s): CKTOTAL, CKMB, CKMBINDEX, TROPONINI in the last 168 hours. BNP (last 3 results) No results for input(s): PROBNP in the last 8760 hours. HbA1C: No results for input(s): HGBA1C in the last 72 hours. CBG: No results for input(s): GLUCAP in the last 168 hours. Lipid Profile: No results for input(s): CHOL, HDL, LDLCALC, TRIG, CHOLHDL, LDLDIRECT in the last 72 hours. Thyroid Function Tests: No results for input(s): TSH, T4TOTAL, FREET4, T3FREE, THYROIDAB in the last 72 hours. Anemia Panel: No results for input(s): VITAMINB12, FOLATE, FERRITIN, TIBC, IRON, RETICCTPCT in the last 72 hours. Urine analysis:    Component Value Date/Time   COLORURINE YELLOW 06/11/2019 0901   APPEARANCEUR CLEAR 06/11/2019 0901   LABSPEC 1.011 06/11/2019 0901   PHURINE 5.0 06/11/2019 0901   GLUCOSEU NEGATIVE 06/11/2019 0901   HGBUR MODERATE (A) 06/11/2019 0901   BILIRUBINUR NEGATIVE 06/11/2019 0901   KETONESUR NEGATIVE 06/11/2019 0901   PROTEINUR 100 (A) 06/11/2019 0901   NITRITE NEGATIVE 06/11/2019 0901   LEUKOCYTESUR NEGATIVE 06/11/2019 0901    Radiological Exams on Admission: Dg Cervical Spine 2-3 Views  Result Date: 06/11/2019 CLINICAL DATA:  Posterior neck pain after fall EXAM: CERVICAL SPINE - 2-3 VIEW COMPARISON:  None. FINDINGS: Limited exam on lateral view secondary to  overlapping structures/patient positioning. Straightening of the cervical lordosis. No fracture visualized. Dens and lateral masses appear aligned. Multilevel intervertebral disc height loss. No definite prevertebral soft tissue swelling, although evaluation is somewhat limited. IMPRESSION: Limited exam. No obvious fracture or malalignment of the cervical spine. If high clinical suspicion for fracture remains, CT should be considered. Electronically Signed   By: Davina Poke M.D.   On: 06/11/2019 10:16   Dg Lumbar Spine 2-3 Views  Result Date: 06/11/2019 CLINICAL DATA:  Back pain after fall EXAM: LUMBAR SPINE - 2-3 VIEW COMPARISON:  None. FINDINGS: Five lumbar type vertebral segments. Vertebral body heights and alignment are maintained. No fracture identified. Intervertebral disc spaces are relatively preserved. Minimal degenerative endplate changes. Mild lower lumbar facet arthrosis. Degenerative change of the right SI joint. Abdominal aortic atherosclerotic calcifications. IMPRESSION: No acute osseous abnormality of the lumbar spine. Electronically Signed   By: Davina Poke M.D.   On: 06/11/2019 10:14   Ct Head Wo Contrast  Result Date: 06/10/2019 CLINICAL DATA:  Slurred speech and altered mental status EXAM: CT HEAD WITHOUT CONTRAST TECHNIQUE: Contiguous axial images were obtained from the base of the skull through the vertex without intravenous contrast. COMPARISON:  None. FINDINGS: Brain: There is no mass, hemorrhage or extra-axial collection. The size and configuration of the ventricles and extra-axial CSF spaces are normal. There is hypoattenuation of the white matter, most commonly indicating chronic small vessel disease. Vascular: No abnormal hyperdensity of the major intracranial arteries or dural venous sinuses. No intracranial atherosclerosis. Skull: The visualized skull base, calvarium and extracranial soft tissues are normal. Sinuses/Orbits: No fluid levels or advanced mucosal  thickening of the visualized paranasal sinuses. No mastoid or middle ear effusion. The orbits are normal. IMPRESSION: Chronic small vessel disease without acute intracranial abnormality. Electronically Signed  By: Ulyses Jarred M.D.   On: 06/10/2019 23:18   Dg Hip Unilat W Or Wo Pelvis 2-3 Views Left  Result Date: 06/11/2019 CLINICAL DATA:  Fall.  Back pain. EXAM: DG HIP (WITH OR WITHOUT PELVIS) 2-3V LEFT COMPARISON:  No recent. FINDINGS: Degenerative change lumbar spine and both hips. No acute bony abnormality. Peripheral vascular calcification. IMPRESSION: 1. Degenerative changes lumbar spine and both hips. No acute abnormality. 2.  Peripheral vascular disease. Electronically Signed   By: Marcello Moores  Register   On: 06/11/2019 10:12    EKG: Independently reviewed. No EKG on the chart  Assessment/Plan Active Problems:   TIA (transient ischemic attack)   Essential hypertension   Dementia (HCC)   Type II diabetes mellitus (HCC)   S/P AVR (aortic valve replacement)   Paroxysmal atrial fibrillation (HCC)  (please populate well all problems here in Problem List. (For example, if patient is on BP meds at home and you resume or decide to hold them, it is a problem that needs to be her. Same for CAD, COPD, HLD and so on)   1. TIA - patient with risk factors of Age, gender, HTN, PAF, mild carotid plaque 2018 by U/S, calculated CHa2DS2-VASc 3.2% in August now with fall and speech change worrisome for TIA. CT head negative, MRI ordered - question of PPM as contra-indication. Plan Observation to tele  MRI pending  PT/OT eval - safety at home  Continue ASA 81 mg  Lovenox DVT prophy  May need NOAC at discharge: family reluctant but agrees if evidence of TIA more substantive.  2. PAF - in sinus rhythm at exam.  Plan Tele observation  3. HTN- continue home meds  4. Dementia - seems mild on exam. Formal testing not done. Takes herbal remedies at home including Cyanne, Ginko. Will defer medical therapy  to Dr. Joen Laura.   5. Pre-diabetes - last A1C April 2018 6.2%. More recent test may be in [facility] record.  Plan A1C  Heart healthy/low carb diet.   DVT prophylaxis: lovenox (Lovenox/Heparin/SCD's/anticoagulated/None (if comfort care) Code Status: full code (Full/Partial (specify details) Family Communication: spoke at length with son [name]. Reviewed stroke risk score and benefit of NOAC - remains reluctant. Agrees with plan (Specify name, relationship. Do not write "discussed with patient". Specify tel # if discussed over the phone) Disposition Plan: home 48 hrs (specify when and where you expect patient to be discharged) Consults called: none (with names) Admission status: obs/tele (inpatient / obs / tele / medical floor / SDU)   Adella Hare MD [facility] Pager [phone]  If 7PM-7AM, please contact night-coverage [email or web address] Password [email or web address]  06/11/2019, 1:38 PM

## 2019-06-11 NOTE — ED Notes (Signed)
Patient transported to X-ray 

## 2019-06-11 NOTE — ED Notes (Signed)
Patient transported to CT 

## 2019-06-11 NOTE — ED Provider Notes (Signed)
[facility] EMERGENCY DEPARTMENT Provider Note   CSN: [id number] Arrival date & time: 06/10/19  2104     History   Chief Complaint Chief Complaint  Patient presents with   Slurred Speech/Dysuria/Dementia    HPI James Curtis is a 78 y.o. male.     Patient is a 78 year old male with past medical history as below presents emergency department for evaluation of recurrent falls over the past 2 to 3 days.  He is also been reportedly having some dysuria and urinary frequency over the past 2 to 3 days.  Patient is accompanied by his son who is also his power of attorney and the history was provided by both the patient and his son.  Patient reports that he has having some pain in his left hip as well as his lumbar spine area as well as his cervical spine area after 2 falls that occurred in the past 2 days.  It is unclear if these falls were mechanical or related to syncopal episodes.  Patient son also reports that his speech has been slurred yesterday but has improved over the night.  At the time of my evaluation of patient he has been waiting to be seen for approximately 12 hours.  Patient son states that previously when patient has had episodes like this there were issues with his heart (patient has a pacemaker in place and is currently being paced.  Cardiology notes demonstrate the patient has history of complete heart block.  Also according the patient's son he was having more left-sided weakness last night that he is currently.  No significant Covid exposures recently.  No other exacerbating or alleviating factors to this patient's presenting symptoms.     Past Medical History:  Diagnosis Date   Aortic stenosis    a. 11/20/2016 Echo: mod AS;  b. 11/23/2016 TEE restricted mobility of noncoronary cusp;  c. 11/2016 Cath: mean grad 339mHg w/ dual lumen catheter, AVA 2.0cm^2; mean gradient by pullback = 1339mg, AVA 2.3cm^2.   Cardiomyopathy (HCTaopi   Echo 6/18: EF 40-45,  inf/inf-septal HK-AK, Gr 1 DD, severe LVH, MAC, severe LAE, mildly reduced RVSF   Carotid arterial disease ([place]   a. 11/2016 Carotid U/S: bilat 1-39% ICA stenosis.   Chronic kidney disease    Complete heart block (HCM[place]   a. 11/21/2016 s/p MDT AzSantina EvansR MRI SureScan (ser # [id number]).   Dementia ([facility]   mild   Dementia (HCC[place]   Family history of adverse reaction to anesthesia    Son [name] a difficult time waking up   Heart murmur    Hemolytic anemia (HCC)    Hypercholesteremia    Hypertension    benign   Incidental pulmonary nodule, greater than or equal to 39m50m needs f/u scan 3 months 11/28/2016   14 x 8 mm (mean diameter of 11 mm) left lower lobe pulmonary nodule with some surrounding ground-glass attenuation (axial image 42 of series 6). 9 x 5 mm (mean diameter of 7 mm) subpleural nodule in the periphery of the right upper lobe (axial image 20 of series 6). 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 11/2009 TEE: EF 60-65%, restricted mobility of noncoronary AoV cusp, 16x4 mm mobile Ca2+ echodensity on the atrial basal surface of the posterior MV leaflet, mild MR, no  LA/LAA thrombus.   Non-obstructive CAD (coronary artery disease)    a. 11/2016 Cath: LM large, nl, LAD min irregs, LCX large, min irregs, OM3 40, RCA large, 64m    Paravalvular leak (prosthetic valve)    Echo 9/19: Severe concentric LVH, EF 55-60, normal wall motion, grade 2 diastolic dysfunction, bioprosthetic AVR with mild to moderate perivalvular regurgitation, moderate to severely calcified mitral valve annulus, severe LAE, mild to moderate TR, PASP 42   Paravalvular leak (prosthetic valve), subsequent encounter    Pre-diabetes    Prostate cancer ([facility]    s/p radiation   Renal mass of unknown nature - needs f/u MRI 11/28/2016    Multiple renal lesions are noted in the kidneys bilaterally. Although several of these lesions are low-attenuation, compatible with cysts, other lesions are intermediate to high attenuation, potentially enhancing   S/P aortic valve replacement with bioprosthetic valve 12/02/2016   23 mm Edwards Intuity rapid deployment bovine pericardial tissue valve   S/P resection of left atrial mass 12/02/2016   Benign dystrophic fibrotic tissue with calcification attached to posterior mitral annulus by thin fibrotic stalk   Stroke (HCC)    TIA (transient ischemic attack)    a. 11/2016 in setting of complete heart block.    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   Valvular heart disease 08/25/2017   Hyperlipidemia 03/07/2017   Former smoker 03/07/2017   Chronic combined systolic and diastolic CHF (congestive heart failure) ([place] 02/11/2017   NICM (nonischemic cardiomyopathy) ([place]    S/P resection of left atrial mass 12/02/2016   S/P AVR (aortic valve replacement) 12/02/2016   Type II diabetes mellitus (HCC)    Chronic kidney disease, stage III (moderate) (HCC)    Incidental pulmonary nodule, greater than or equal to 829m- needs f/u scan 3 months 11/28/2016   Renal mass of unknown nature - needs f/u MRI 11/28/2016   Coronary artery disease involving native coronary artery of native heart without angina pectoris 11/26/2016   Mitral valve mass    Dementia (HCC)    TIA (transient ischemic attack)    Cardiac pacemaker in situ    Essential hypertension    Complete heart block (HCPear[place]04/01/2017    Past Surgical History:  Procedure Laterality Date   AORTIC VALVE REPLACEMENT N/A 12/02/2016   Procedure: AORTIC VALVE REPLACEMENT (AVR) using a 23 Edwards Intuity Elite Aortic Valve;  Surgeon: ClRexene AlbertsMD;  [facility] Service: Open Heart Surgery;  Laterality: N/A;   EXCISION OF ATRIAL MYXOMA N/A 12/02/2016   Procedure: Resection of left atrial mass ;  Surgeon: ClRexene AlbertsMD;  [place] [facility] [facility] [place] Service: Open Heart Surgery;  Laterality: N/A;   PACEMAKER IMPLANT N/A 11/21/2016   Procedure: Pacemaker Implant;  Surgeon: Will MaMeredith LeedsMD;  [place] [facility];  Service: Cardiovascular;  Laterality: N/A;   RIGHT/LEFT HEART CATH AND CORONARY ANGIOGRAPHY N/A 11/25/2016   Procedure: Right/Left Heart Cath and Coronary Angiography;  Surgeon: ChNelva BushMD;  Location: [facility];  Service: Cardiovascular;  Laterality: N/A;   TEE WITHOUT CARDIOVERSION N/A 11/23/2016   Procedure: TRANSESOPHAGEAL ECHOCARDIOGRAM (TEE);  Surgeon: MaJerline PainMD;  [place]: [facility] [place] Service: Cardiovascular;  Laterality: N/A;   TEE WITHOUT CARDIOVERSION N/A 12/02/2016   Procedure: TRANSESOPHAGEAL ECHOCARDIOGRAM (TEE);  Surgeon: Rexene Alberts, MD;  Location: [place];  Service: Open Heart Surgery;  Laterality: N/A;   TEE WITHOUT CARDIOVERSION N/A 08/22/2018   Procedure: TRANSESOPHAGEAL ECHOCARDIOGRAM (TEE);  Surgeon: Josue Hector, MD;  Location: [facility] ENDOSCOPY;  Service: Cardiovascular;  Laterality: N/A;        Home Medications    Prior to Admission medications   Medication Sig Start Date End Date Taking? Authorizing Provider  amLODipine (NORVASC) 10 MG tablet Take 10 mg by mouth daily. 10/25/16   [provider]  amoxicillin (AMOXIL) 500 MG tablet Take 4 tablets (2 grams) by mouth 30-60 minutes prior to dental visit. 08/24/17   End, [name], MD  aspirin EC 81 MG tablet Take 81 mg by mouth daily.    [provider]  Capsicum, Cayenne, (CAYENNE PEPPER PO) Take by mouth. Takes 1 tablet po as directed. This is a complex tablet along with ginko biloba    [provider]  carvedilol (COREG) 6.25 MG tablet TAKE 1 TABLET BY MOUTH TWICE A DAY  05/28/18   End, [name], MD  cholecalciferol (VITAMIN D) 1000 units tablet Take 1,000 Units by mouth daily.    [provider]  folic acid (FOLVITE) 1 MG tablet TAKE 3 TABLETS BY MOUTH DAILY 12/17/18   Nicholas Lose, MD  Ginkgo Biloba 40 MG TABS Take by mouth. Unsure dose. Complex tablet along with cayenne pepper. Takes as directed.    [provider]  losartan (COZAAR) 100 MG tablet Take 1 tablet (100 mg total) by mouth daily. 03/29/17 10/10/18  [name] T, PA-C  Omega-3 Fatty Acids (OMEGA 3 PO) Take by mouth daily.    [provider]  Darlina Rumpf POWD by Does not apply route as directed. BID    [provider]  Tetrahydrozoline HCl (VISINE OP) Apply to eye as directed.    [provider]    Family History Family History  Problem Relation Age of Onset   Cancer Father     Social History Social History   Tobacco Use   Smoking status: Former Smoker    Packs/day: 0.25    Types: Cigarettes    Quit date: 11/16/2016    Years since quitting: 2.5   Smokeless tobacco: Never Used  Substance Use Topics   Alcohol use: No   Drug use: No     Allergies   Patient has no known allergies.   Review of Systems Review of Systems  Constitutional: Negative for chills and fever.  HENT: Negative for ear pain and sore throat.   Eyes: Negative for pain and visual disturbance.  Respiratory: Negative for cough and shortness of breath.   Cardiovascular: Negative for chest pain and palpitations.  Gastrointestinal: Negative for abdominal pain and vomiting.  Genitourinary: Positive for dysuria, frequency and urgency. Negative for discharge, hematuria, penile pain, penile swelling, scrotal swelling and testicular pain.  Musculoskeletal: Negative for arthralgias and back pain.  Skin: Negative for color change and rash.  Neurological: Positive for dizziness, speech difficulty and weakness. Negative for seizures and syncope.  Psychiatric/Behavioral:  Positive for confusion.  All other systems reviewed and are negative.    Physical Exam Updated Vital Signs BP (!) 145/77    Pulse 77    Temp 98.7 F (37.1 C) (Oral)    Resp (!) 25    SpO2 96%   Physical Exam Vitals signs and nursing note reviewed.  Constitutional:      Appearance: He is well-developed. He is obese. He is ill-appearing (chronically).  HENT:     Head: Normocephalic and atraumatic.  Eyes:     Conjunctiva/sclera: Conjunctivae normal.  Neck:     Musculoskeletal: Neck supple. No neck rigidity.  Cardiovascular:     Rate and Rhythm: Normal rate and regular rhythm.     Heart sounds: No murmur.  Pulmonary:     Effort: Pulmonary effort is normal. No respiratory distress.     Breath sounds: Normal breath sounds.  Abdominal:     Palpations: Abdomen is soft.     Tenderness: There is no abdominal tenderness. There is no right CVA tenderness or left CVA tenderness.  Musculoskeletal:        General: Tenderness (R lateral neck TTP, L spine TTP, L pelvis TTP) present.  Skin:    General: Skin is warm and dry.     Capillary Refill: Capillary refill takes less than 2 seconds.  Neurological:     General: No focal deficit present.     Mental Status: He is alert and oriented to person, place, and time.     Cranial Nerves: No cranial nerve deficit.     Sensory: No sensory deficit.     Motor: No weakness (Strength 5 out of 5 in all extremities proximally and distally).     Coordination: Coordination normal.     Deep Tendon Reflexes: Reflexes normal.     Comments: Patient seems somewhat confused intermittently although he answers all orientation questions correctly.  Psychiatric:        Mood and Affect: Mood normal.      ED Treatments / Results  Labs (all labs ordered are listed, but only abnormal results are displayed) Labs Reviewed  PROTIME-INR - Abnormal; Notable for the following components:      Result Value   Prothrombin Time 15.4 (*)    All other components within  normal limits  CBC - Abnormal; Notable for the following components:   RBC 2.95 (*)    Hemoglobin 8.9 (*)    HCT 26.9 (*)    All other components within normal limits  COMPREHENSIVE METABOLIC PANEL - Abnormal; Notable for the following components:   Glucose, Bld 122 (*)    Creatinine, Ser 1.98 (*)    AST 47 (*)    Total Bilirubin 1.4 (*)    GFR calc non Af Amer 31 (*)    GFR calc Af Amer 36 (*)    All other components within normal limits  URINALYSIS, ROUTINE W REFLEX MICROSCOPIC - Abnormal; Notable for the following components:   Hgb urine dipstick MODERATE (*)    Protein, ur 100 (*)    Bacteria, UA RARE (*)    All other components within normal limits  I-STAT CHEM 8, ED - Abnormal; Notable for the following components:   Creatinine, Ser 1.90 (*)    Glucose, Bld 117 (*)    Hemoglobin 9.5 (*)    HCT 28.0 (*)    All other components within normal limits  TROPONIN I (HIGH SENSITIVITY) - Abnormal; Notable for the following components:   Troponin I (High Sensitivity) 20 (*)    All other components within normal limits  TROPONIN I (HIGH SENSITIVITY) - Abnormal; Notable for the following components:   Troponin I (High Sensitivity) 24 (*)    All other components within normal limits  SARS CORONAVIRUS 2 (TAT 6-24 HRS)  URINE CULTURE  APTT  DIFFERENTIAL  CBG MONITORING, ED    EKG None  Radiology Dg Cervical Spine 2-3 Views  Result Date: 06/11/2019 CLINICAL DATA:  Posterior neck pain after fall EXAM: CERVICAL SPINE - 2-3 VIEW COMPARISON:  None. FINDINGS: Limited exam on lateral view secondary to overlapping structures/patient positioning. Straightening of the cervical lordosis. No fracture visualized. Dens and lateral masses appear aligned. Multilevel intervertebral disc height loss. No definite prevertebral soft tissue swelling, although evaluation is somewhat limited. IMPRESSION: Limited exam. No obvious fracture or malalignment of the cervical spine. If high clinical suspicion  for fracture remains, CT should be considered. Electronically Signed   By: Davina Poke M.D.   On: 06/11/2019 10:16   Dg Lumbar Spine 2-3 Views  Result Date: 06/11/2019 CLINICAL DATA:  Back pain after fall EXAM: LUMBAR SPINE - 2-3 VIEW COMPARISON:  None. FINDINGS: Five lumbar type vertebral segments. Vertebral body heights and alignment are maintained. No fracture identified. Intervertebral disc spaces are relatively preserved. Minimal degenerative endplate changes. Mild lower lumbar facet arthrosis. Degenerative change of the right SI joint. Abdominal aortic atherosclerotic calcifications. IMPRESSION: No acute osseous abnormality of the lumbar spine. Electronically Signed   By: Davina Poke M.D.   On: 06/11/2019 10:14   Ct Head Wo Contrast  Result Date: 06/10/2019 CLINICAL DATA:  Slurred speech and altered mental status EXAM: CT HEAD WITHOUT CONTRAST TECHNIQUE: Contiguous axial images were obtained from the base of the skull through the vertex without intravenous contrast. COMPARISON:  None. FINDINGS: Brain: There is no mass, hemorrhage or extra-axial collection. The size and configuration of the ventricles and extra-axial CSF spaces are normal. There is hypoattenuation of the white matter, most commonly indicating chronic small vessel disease. Vascular: No abnormal hyperdensity of the major intracranial arteries or dural venous sinuses. No intracranial atherosclerosis. Skull: The visualized skull base, calvarium and extracranial soft tissues are normal. Sinuses/Orbits: No fluid levels or advanced mucosal thickening of the visualized paranasal sinuses. No mastoid or middle ear effusion. The orbits are normal. IMPRESSION: Chronic small vessel disease without acute intracranial abnormality. Electronically Signed   By: Ulyses Jarred M.D.   On: 06/10/2019 23:18   Dg Hip Unilat W Or Wo Pelvis 2-3 Views Left  Result Date: 06/11/2019 CLINICAL DATA:  Fall.  Back pain. EXAM: DG HIP (WITH OR WITHOUT  PELVIS) 2-3V LEFT COMPARISON:  No recent. FINDINGS: Degenerative change lumbar spine and both hips. No acute bony abnormality. Peripheral vascular calcification. IMPRESSION: 1. Degenerative changes lumbar spine and both hips. No acute abnormality. 2.  Peripheral vascular disease. Electronically Signed   By: Marcello Moores  Register   On: 06/11/2019 10:12    Procedures Procedures (including critical care time)  Medications Ordered in ED Medications  sodium chloride flush (NS) 0.9 % injection 3 mL (has no administration in time range)  aspirin EC tablet 81 mg (has no administration in time range)  amLODipine (NORVASC) tablet 10 mg (has no administration in time range)  carvedilol (COREG) tablet 6.25 mg (has no administration in time range)  losartan (COZAAR) tablet 100 mg (has no administration in time range)  folic acid (FOLVITE) tablet 3 mg (has no administration in time range)  tetrahydrozoline 0.05 % ophthalmic solution 1 drop (has no administration in time range)   stroke: mapping our early stages of recovery book (has no administration in time range)  0.9 %  sodium chloride infusion (has no administration in time range)  acetaminophen (TYLENOL) tablet 650 mg (has no administration in time range)    Or  acetaminophen (TYLENOL) 160 MG/5ML solution 650 mg (has no administration in time range)    Or  acetaminophen (TYLENOL) suppository 650 mg (has no  administration in time range)  senna-docusate (Senokot-S) tablet 1 tablet (has no administration in time range)  enoxaparin (LOVENOX) injection 30 mg (has no administration in time range)     Initial Impression / Assessment and Plan / ED Course  I have reviewed the triage vital signs and the nursing notes.  Pertinent labs & imaging results that were available during my care of the patient were reviewed by me and considered in my medical decision making (see chart for details).        Patient is a 78 year old male with history and physical  exam as above presents to the emergency department for evaluation of recurrent falls, dysuria, and slurred speech.  Labs and CT scan of the head were obtained prior to my initial evaluation of the patient.  They demonstrated hemoglobin of 8.9.  Other findings on initial labs were creatinine which is near baseline at 1.98 as well as a mildly elevated AST and T bili.  Notably patient has anemia at baseline.  He is complaining of pain in his left hip area as well as his lower back and his neck after sustaining 2 falls that are unclear if they were initiated by syncopal episodes or if they were mechanical falls.  X-rays of these areas demonstrated no emergent findings and no acute fractures.  Patient has some degree of dementia and this is difficult to delineate based on history.  Patient is a pacemaker implanted which was interrogated which demonstrated no acute events.  Urine studies pending at the time of admission.  Patient was admitted to hospitalist service for recurrent falls concerning for possible syncope.  He will also undergo MRI as CT head was negative for acute intracranial abnormality but concern remains for possible TIA given his reported left-sided weakness that has resolved at this time.  Patient was seen in conjunction with my attending physician, Dr. Sedonia Small  Final Clinical Impressions(s) / ED Diagnoses   Final diagnoses:  Recurrent falls  Dysuria    ED Discharge Orders    None       Romona Curls, MD 06/11/19 1515    Maudie Flakes, MD 06/12/19 1215

## 2019-06-11 NOTE — Progress Notes (Signed)
Patient arrived to 3w. A&O x4. Skin intact. States pain in his right foot that started 2 days ago after his fall. Tele has been placed and verified. Call bell within reach. Bed in lowest position. [place]

## 2019-06-12 ENCOUNTER — Observation Stay (HOSPITAL_COMMUNITY): Payer: Medicare Other

## 2019-06-12 ENCOUNTER — Encounter (HOSPITAL_COMMUNITY): Payer: Self-pay

## 2019-06-12 ENCOUNTER — Encounter (HOSPITAL_COMMUNITY): Payer: Medicare Other

## 2019-06-12 DIAGNOSIS — E7800 Pure hypercholesterolemia, unspecified: Secondary | ICD-10-CM | POA: Diagnosis present

## 2019-06-12 DIAGNOSIS — I700 Atherosclerosis of aorta: Secondary | ICD-10-CM | POA: Diagnosis present

## 2019-06-12 DIAGNOSIS — R296 Repeated falls: Secondary | ICD-10-CM | POA: Diagnosis present

## 2019-06-12 DIAGNOSIS — I361 Nonrheumatic tricuspid (valve) insufficiency: Secondary | ICD-10-CM

## 2019-06-12 DIAGNOSIS — R260 Ataxic gait: Secondary | ICD-10-CM | POA: Diagnosis present

## 2019-06-12 DIAGNOSIS — I639 Cerebral infarction, unspecified: Secondary | ICD-10-CM | POA: Diagnosis present

## 2019-06-12 DIAGNOSIS — I480 Paroxysmal atrial fibrillation: Secondary | ICD-10-CM | POA: Diagnosis present

## 2019-06-12 DIAGNOSIS — R350 Frequency of micturition: Secondary | ICD-10-CM | POA: Diagnosis present

## 2019-06-12 DIAGNOSIS — I6523 Occlusion and stenosis of bilateral carotid arteries: Secondary | ICD-10-CM | POA: Diagnosis present

## 2019-06-12 DIAGNOSIS — R300 Dysuria: Secondary | ICD-10-CM | POA: Diagnosis present

## 2019-06-12 DIAGNOSIS — F0390 Unspecified dementia without behavioral disturbance: Secondary | ICD-10-CM | POA: Diagnosis present

## 2019-06-12 DIAGNOSIS — I350 Nonrheumatic aortic (valve) stenosis: Secondary | ICD-10-CM | POA: Diagnosis present

## 2019-06-12 DIAGNOSIS — R29702 NIHSS score 2: Secondary | ICD-10-CM | POA: Diagnosis present

## 2019-06-12 DIAGNOSIS — E785 Hyperlipidemia, unspecified: Secondary | ICD-10-CM | POA: Diagnosis present

## 2019-06-12 DIAGNOSIS — I10 Essential (primary) hypertension: Secondary | ICD-10-CM | POA: Diagnosis not present

## 2019-06-12 DIAGNOSIS — R29810 Facial weakness: Secondary | ICD-10-CM | POA: Diagnosis present

## 2019-06-12 DIAGNOSIS — N184 Chronic kidney disease, stage 4 (severe): Secondary | ICD-10-CM | POA: Diagnosis present

## 2019-06-12 DIAGNOSIS — R471 Dysarthria and anarthria: Secondary | ICD-10-CM | POA: Diagnosis present

## 2019-06-12 DIAGNOSIS — I129 Hypertensive chronic kidney disease with stage 1 through stage 4 chronic kidney disease, or unspecified chronic kidney disease: Secondary | ICD-10-CM | POA: Diagnosis present

## 2019-06-12 DIAGNOSIS — Z20828 Contact with and (suspected) exposure to other viral communicable diseases: Secondary | ICD-10-CM | POA: Diagnosis present

## 2019-06-12 DIAGNOSIS — R4781 Slurred speech: Secondary | ICD-10-CM | POA: Diagnosis present

## 2019-06-12 DIAGNOSIS — I340 Nonrheumatic mitral (valve) insufficiency: Secondary | ICD-10-CM

## 2019-06-12 DIAGNOSIS — R29818 Other symptoms and signs involving the nervous system: Secondary | ICD-10-CM | POA: Diagnosis not present

## 2019-06-12 DIAGNOSIS — I442 Atrioventricular block, complete: Secondary | ICD-10-CM | POA: Diagnosis present

## 2019-06-12 DIAGNOSIS — E1122 Type 2 diabetes mellitus with diabetic chronic kidney disease: Secondary | ICD-10-CM | POA: Diagnosis present

## 2019-06-12 DIAGNOSIS — E1151 Type 2 diabetes mellitus with diabetic peripheral angiopathy without gangrene: Secondary | ICD-10-CM | POA: Diagnosis present

## 2019-06-12 DIAGNOSIS — W19XXXA Unspecified fall, initial encounter: Secondary | ICD-10-CM | POA: Diagnosis present

## 2019-06-12 DIAGNOSIS — R4189 Other symptoms and signs involving cognitive functions and awareness: Secondary | ICD-10-CM | POA: Diagnosis present

## 2019-06-12 DIAGNOSIS — I429 Cardiomyopathy, unspecified: Secondary | ICD-10-CM | POA: Diagnosis present

## 2019-06-12 LAB — URINE CULTURE

## 2019-06-12 LAB — HEMOGLOBIN A1C
Hgb A1c MFr Bld: 4.6 % — ABNORMAL LOW (ref 4.8–5.6)
Mean Plasma Glucose: 85.32 mg/dL

## 2019-06-12 LAB — GLUCOSE, CAPILLARY
Glucose-Capillary: 109 mg/dL — ABNORMAL HIGH (ref 70–99)
Glucose-Capillary: 95 mg/dL (ref 70–99)
Glucose-Capillary: 99 mg/dL (ref 70–99)
Glucose-Capillary: 106 mg/dL — ABNORMAL HIGH (ref 70–99)

## 2019-06-12 LAB — ECHOCARDIOGRAM COMPLETE

## 2019-06-12 NOTE — Progress Notes (Addendum)
  Electrophysiology Team   Informed of MRI for today.   Device system confirmed to be MRI conditional, with implant date > 6 weeks ago and no evidence of abandoned or epicardial leads in review of most recent CXR Interrogation from today reviewed, pt is currently AP-VP at 60 bpm Change device settings for MRI to DOO at 80 bpm  Program device back to pre-MRI settings after completion of exam.  Shirley Friar, PA-C  06/12/2019 3:17 PM

## 2019-06-12 NOTE — Progress Notes (Signed)
PROGRESS NOTE    James Curtis  PZW:[id number] DOB: [birth year] DOA: 06/10/2019 PCP: [name], MD   Brief Narrative:   James Curtis is a 78 y.o. male with medical history significant of complete heart block, s/p bioprosthetic AoR, PAF who has previously declined long term anti-coagulation. Over the 24 hrs prior to admission the patient had a fall while struggling to get out of bed to the bathroom - a new problem. He had a second fall which he describes as the floor seemed to be moving and he could not maintain his balance and went down. He was awake and recalls hitting the floor with his buttock. He also struck his knee. His son was called to assist him and noticed that his speech pattern was changed: slurred words, slower speech and cognitive change. Due to these symptoms the son called EMS and he was transported to [facility]  Upon arrival to ED, he was hemodynamically stable,labs with normal WBC, stable chronic anemia, stable CKD IV with Creatiine 1.90, GFR 31, CT head negative, hip and back films negative. He had a non-focal neuro exam.  He was admitted to hospital service for further work-up of possible stroke.  Assessment & Plan:   Active Problems:   TIA (transient ischemic attack)   Essential hypertension   Dementia (HCC)   Type II diabetes mellitus (HCC)   S/P AVR (aortic valve replacement)   Paroxysmal atrial fibrillation (HCC)  Acute ischemia infection of the right paramedian pons: Patient continues to have slurred speech however this was comprehensible for me.  I then met his son at the bedside in the late afternoon who stated that patient's speech also has improved but is still not back to baseline.  Patient did not have any other complaint or focal deficit.  Consulted neurology.  Further work-up and management per them.  Paroxysmal atrial fibrillation: In sinus rhythm.  He has declined anticoagulation in the past.  Monitor on telemetry.  Essential hypertension: Blood  pressure slightly elevated.  Per neurology note, no need to allow permissive hypertension.  He remains on Tylenol.  Dementia: Seems to be very mild.  According to son, he lives by himself and is quite capable of taking care of himself however lately he has some memory issues.  DVT prophylaxis: Lovenox Code Status: Full code Family Communication:  None present at bedside when I saw him this morning however his son came in later and I talked to him in the room.  He mentioned that he would not want his father to go to any nursing home and they have made arrangements for 24/7 care for him at home.  He is fine with patient being evaluated by PT OT though..  I answered all his questions. Disposition Plan: Potential discharge tomorrow.  Most likely home now that son does not want him to go to SNF.  Still to be evaluated by PT OT.  Estimated body mass index is 31.33 kg/m as calculated from the following:   Height as of 03/29/19: 5' 7.5" (1.715 m).   Weight as of 03/29/19: 92.1 kg.      Nutritional status:            Consultants:   Neurology  Procedures:   None  Antimicrobials:   None   Subjective: Patient seen and examined earlier this morning.  He did have some slurred speech and some left-sided facial droop (speech was quite comprehensible.  He in fact was alert and oriented x3.  Did not have any complaint.  Objective: Vitals:   06/11/19 2302 06/12/19 0356 06/12/19 0809 06/12/19 1214  BP: (!) 169/58 (!) 170/76 (!) 156/64 (!) 156/58  Pulse: 65 60 61 60  Resp: 18 18 18 18   Temp: 98.4 F (36.9 C) 98.9 F (37.2 C) 97.6 F (36.4 C) 97.7 F (36.5 C)  TempSrc: Oral Oral Oral Oral  SpO2: 99% 100% 99% 100%    Intake/Output Summary (Last 24 hours) at 06/12/2019 1606 Last data filed at 06/12/2019 0700 Gross per 24 hour  Intake 683.62 ml  Output 850 ml  Net -166.38 ml   There were no vitals filed for this visit.  Examination:  General exam: Appears calm and comfortable   Respiratory system: Clear to auscultation. Respiratory effort normal. Cardiovascular system: S1 & S2 heard, RRR. No JVD, murmurs, rubs, gallops or clicks. No pedal edema. Gastrointestinal system: Abdomen is nondistended, soft and nontender. No organomegaly or masses felt. Normal bowel sounds heard. Central nervous system: Alert and oriented x3.  Some slurred speech and left facial droop.  No focal deficit otherwise. Extremities: Symmetric 5 x 5 power. Skin: No rashes, lesions or ulcers Psychiatry: Judgement and insight appear poor. Mood & affect flat.   Data Reviewed: I have personally reviewed following labs and imaging studies  CBC: Recent Labs  Lab 06/10/19 2216 06/10/19 2247  WBC 6.0  --   NEUTROABS 4.4  --   HGB 8.9* 9.5*  HCT 26.9* 28.0*  MCV 91.2  --   PLT 172  --    Basic Metabolic Panel: Recent Labs  Lab 06/10/19 2216 06/10/19 2247  NA 140 139  K 4.0 4.2  CL 107 106  CO2 23  --   GLUCOSE 122* 117*  BUN 16 19  CREATININE 1.98* 1.90*  CALCIUM 9.3  --    GFR: CrCl cannot be calculated (Unknown ideal weight.). Liver Function Tests: Recent Labs  Lab 06/10/19 2216  AST 47*  ALT 17  ALKPHOS 62  BILITOT 1.4*  PROT 7.2  ALBUMIN 4.3   No results for input(s): LIPASE, AMYLASE in the last 168 hours. No results for input(s): AMMONIA in the last 168 hours. Coagulation Profile: Recent Labs  Lab 06/10/19 2216  INR 1.2   Cardiac Enzymes: No results for input(s): CKTOTAL, CKMB, CKMBINDEX, TROPONINI in the last 168 hours. BNP (last 3 results) No results for input(s): PROBNP in the last 8760 hours. HbA1C: Recent Labs    06/12/19 0610  HGBA1C 4.6*   CBG: Recent Labs  Lab 06/12/19 0414 06/12/19 0632 06/12/19 1211  GLUCAP 109* 95 99   Lipid Profile: No results for input(s): CHOL, HDL, LDLCALC, TRIG, CHOLHDL, LDLDIRECT in the last 72 hours. Thyroid Function Tests: No results for input(s): TSH, T4TOTAL, FREET4, T3FREE, THYROIDAB in the last 72  hours. Anemia Panel: No results for input(s): VITAMINB12, FOLATE, FERRITIN, TIBC, IRON, RETICCTPCT in the last 72 hours. Sepsis Labs: No results for input(s): PROCALCITON, LATICACIDVEN in the last 168 hours.  Recent Results (from the past 240 hour(s))  Urine Culture     Status: Abnormal   Collection Time: 06/11/19 12:01 PM   Specimen: Urine, Clean Catch  Result Value Ref Range Status   Specimen Description URINE, CLEAN CATCH  Final   Special Requests   Final    NONE Performed at [facility], [address], [place], [place] [address]    Culture MULTIPLE SPECIES PRESENT, SUGGEST RECOLLECTION (A)  Final   Report Status 06/12/2019 FINAL  Final  SARS CORONAVIRUS 2 (TAT 6-24 HRS)  Nasopharyngeal Nasopharyngeal Swab     Status: None   Collection Time: 06/11/19 12:55 PM   Specimen: Nasopharyngeal Swab  Result Value Ref Range Status   SARS Coronavirus 2 NEGATIVE NEGATIVE Final    Comment: (NOTE) SARS-CoV-2 target nucleic acids are NOT DETECTED. The SARS-CoV-2 RNA is generally detectable in upper and lower respiratory specimens during the acute phase of infection. Negative results do not preclude SARS-CoV-2 infection, do not rule out co-infections with other pathogens, and should not be used as the sole basis for treatment or other patient management decisions. Negative results must be combined with clinical observations, patient history, and epidemiological information. The expected result is Negative. Fact Sheet for Patients: SugarRoll.be Fact Sheet for Healthcare Providers: https://www.woods-mathews.com/ This test is not yet approved or cleared by the [place] FDA and  has been authorized for detection and/or diagnosis of SARS-CoV-2 by FDA under an Emergency Use Authorization (EUA). This EUA will remain  in effect (meaning this test can be used) for the duration of the COVID-19 declaration under Section 56 4(b)(1) of the Act, 21  U.S.C. section 360bbb-3(b)(1), unless the authorization is terminated or revoked sooner. Performed at [facility], [place] [address], [place], [place] [address]       Radiology Studies: Dg Cervical Spine 2-3 Views  Result Date: 06/11/2019 CLINICAL DATA:  Posterior neck pain after fall EXAM: CERVICAL SPINE - 2-3 VIEW COMPARISON:  None. FINDINGS: Limited exam on lateral view secondary to overlapping structures/patient positioning. Straightening of the cervical lordosis. No fracture visualized. Dens and lateral masses appear aligned. Multilevel intervertebral disc height loss. No definite prevertebral soft tissue swelling, although evaluation is somewhat limited. IMPRESSION: Limited exam. No obvious fracture or malalignment of the cervical spine. If high clinical suspicion for fracture remains, CT should be considered. Electronically Signed   By: Davina Poke M.D.   On: 06/11/2019 10:16   Dg Lumbar Spine 2-3 Views  Result Date: 06/11/2019 CLINICAL DATA:  Back pain after fall EXAM: LUMBAR SPINE - 2-3 VIEW COMPARISON:  None. FINDINGS: Five lumbar type vertebral segments. Vertebral body heights and alignment are maintained. No fracture identified. Intervertebral disc spaces are relatively preserved. Minimal degenerative endplate changes. Mild lower lumbar facet arthrosis. Degenerative change of the right SI joint. Abdominal aortic atherosclerotic calcifications. IMPRESSION: No acute osseous abnormality of the lumbar spine. Electronically Signed   By: Davina Poke M.D.   On: 06/11/2019 10:14   Ct Head Wo Contrast  Result Date: 06/10/2019 CLINICAL DATA:  Slurred speech and altered mental status EXAM: CT HEAD WITHOUT CONTRAST TECHNIQUE: Contiguous axial images were obtained from the base of the skull through the vertex without intravenous contrast. COMPARISON:  None. FINDINGS: Brain: There is no mass, hemorrhage or extra-axial collection. The size and configuration of the ventricles and  extra-axial CSF spaces are normal. There is hypoattenuation of the white matter, most commonly indicating chronic small vessel disease. Vascular: No abnormal hyperdensity of the major intracranial arteries or dural venous sinuses. No intracranial atherosclerosis. Skull: The visualized skull base, calvarium and extracranial soft tissues are normal. Sinuses/Orbits: No fluid levels or advanced mucosal thickening of the visualized paranasal sinuses. No mastoid or middle ear effusion. The orbits are normal. IMPRESSION: Chronic small vessel disease without acute intracranial abnormality. Electronically Signed   By: Ulyses Jarred M.D.   On: 06/10/2019 23:18   Mr Brain Wo Contrast  Result Date: 06/11/2019 CLINICAL DATA:  TIA.  Slurred speech.  Altered mental status. EXAM: MRI HEAD WITHOUT CONTRAST TECHNIQUE: Multiplanar, multiecho  pulse sequences of the brain and surrounding structures were obtained without intravenous contrast. COMPARISON:  Head CT yesterday. FINDINGS: Brain: Diffusion imaging shows acute infarction affecting the right para median pons. No other acute finding. Few old small vessel infarctions affecting the pons. Cerebral hemispheres show age related atrophy with mild to moderate chronic small-vessel ischemic change of the deep white matter. No cortical infarction. No mass, hemorrhage, hydrocephalus or extra-axial collection. Vascular: Major vessels at the base of the brain show flow. Skull and upper cervical spine: Negative Sinuses/Orbits: Clear/normal Other: None IMPRESSION: Acute infarction affecting the right para median pons. No swelling or hemorrhage. Mild to moderate chronic small-vessel ischemic changes elsewhere affecting the pons and the cerebral hemispheric white matter. Electronically Signed   By: Nelson Chimes M.D.   On: 06/11/2019 15:55   Dg Hip Unilat W Or Wo Pelvis 2-3 Views Left  Result Date: 06/11/2019 CLINICAL DATA:  Fall.  Back pain. EXAM: DG HIP (WITH OR WITHOUT PELVIS) 2-3V  LEFT COMPARISON:  No recent. FINDINGS: Degenerative change lumbar spine and both hips. No acute bony abnormality. Peripheral vascular calcification. IMPRESSION: 1. Degenerative changes lumbar spine and both hips. No acute abnormality. 2.  Peripheral vascular disease. Electronically Signed   By: [place]   On: 06/11/2019 10:12    Scheduled Meds:   stroke: mapping our early stages of recovery book   Does not apply Once   aspirin EC  81 mg Oral Daily   carvedilol  6.25 mg Oral BID   enoxaparin (LOVENOX) injection  30 mg Subcutaneous W10U   folic acid  3 mg Oral Daily   senna-docusate  1 tablet Oral QHS   sodium chloride flush  3 mL Intravenous Once   Continuous Infusions:  sodium chloride 50 mL/hr at 06/12/19 0400     LOS: 0 days   Time spent: 37 minutes   Darliss Cheney, MD [facility]  06/12/2019, 4:06 PM   To contact the attending provider between 7A-7P or the covering provider during after hours 7P-7A, please log into the web site [email or web address] and use password [email or web address].

## 2019-06-12 NOTE — Evaluation (Signed)
Speech Language Pathology Evaluation Patient Details Name: James Curtis MRN: [id number] DOB: 1941/05/05 Today's Date: 06/12/2019 Time: 1127-1203 SLP Time Calculation (min) (ACUTE ONLY): 36 min  Problem List:  Patient Active Problem List   Diagnosis Date Noted  . Paroxysmal atrial fibrillation ([place]) 03/29/2019  . Paravalvular leak (prosthetic valve)   . Hemolytic anemia, acute ([place])   . Hemispheric carotid artery syndrome 09/04/2017  . Valvular heart disease 08/25/2017  . Hyperlipidemia 03/07/2017  . Former smoker 03/07/2017  . Chronic combined systolic and diastolic CHF (congestive heart failure) ([place]) 02/11/2017  . NICM (nonischemic cardiomyopathy) ([place])   . S/P resection of left atrial mass 12/02/2016  . S/P AVR (aortic valve replacement) 12/02/2016  . Type II diabetes mellitus ([place])   . Chronic kidney disease, stage III (moderate) (HCC)   . Incidental pulmonary nodule, greater than or equal to 26m - needs f/u scan 3 months 11/28/2016  . Renal mass of unknown nature - needs f/u MRI 11/28/2016  . Coronary artery disease involving native coronary artery of native heart without angina pectoris 11/26/2016  . Mitral valve mass   . Dementia ([place]   . TIA (transient ischemic attack)   . Cardiac pacemaker in situ   . Essential hypertension   . Complete heart block ([place] 11/18/2016   Past Medical History:  Past Medical History:  Diagnosis Date  . Aortic stenosis    a. 11/20/2016 Echo: mod AS;  b. 11/23/2016 TEE restricted mobility of noncoronary cusp;  c. 11/2016 Cath: mean grad 259mg w/ dual lumen catheter, AVA 2.0cm^2; mean gradient by pullback = 1638m, AVA 2.3cm^2.  . Cardiomyopathy ([place]  Echo 6/18: EF 40-45, inf/inf-septal HK-AK, Gr 1 DD, severe LVH, MAC, severe LAE, mildly reduced RVSF  . Carotid arterial disease ([place]CC[place]  a. 11/2016 Carotid U/S: bilat 1-39% ICA stenosis.  . Chronic kidney disease   . Complete heart block ([place]CC[place]  a. 11/21/2016 s/p MDT AzuSantina Evans MRI  SureScan (ser # [id number].  . Dementia ([place]CC[place]  mild  . Dementia ([place]CC[place]wn[place] . Family history of adverse reaction to anesthesia    Son MicLegrand Comod a difficult time waking up  . Heart murmur   . Hemolytic anemia ([place]CC[place] . Hypercholesteremia   . Hypertension    benign  . Incidental pulmonary nodule, greater than or equal to 8mm21mneeds f/u scan 3 months 11/28/2016   14 x 8 mm (mean diameter of 11 mm) left lower lobe pulmonary nodule with some surrounding ground-glass attenuation (axial image 42 of series 6). 9 x 5 mm (mean diameter of 7 mm) subpleural nodule in the periphery of the right upper lobe (axial image 20 of series 6). A few patchy areas of peripheral predominant ground-glass attenuation and subpleural reticulation are noted, scattered randomly throu  . Left atrial mass   . 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 11/2009 TEE: EF 60-65%, restricted mobility of noncoronary AoV cusp, 16x4 mm mobile Ca2+ echodensity on the atrial basal surface of the posterior MV leaflet, mild MR, no LA/LAA thrombus.  . Non-obstructive CAD (coronary artery disease)    a. 11/2016 Cath: LM large, nl, LAD min irregs, LCX large, min irregs, OM3 40, RCA large, 27m,75m Paravalvular leak (prosthetic valve)    Echo 9/19: Severe concentric LVH, EF 55-60, normal wall motion, grade 2 diastolic dysfunction, bioprosthetic AVR with mild to moderate perivalvular regurgitation, moderate to severely calcified mitral valve annulus,  severe LAE, mild to moderate TR, PASP 42  . Paravalvular leak (prosthetic valve), subsequent encounter   . Pre-diabetes   . Prostate cancer [facility])    s/p radiation  . Renal mass of unknown nature - needs f/u MRI 11/28/2016   Multiple renal lesions are noted in the kidneys bilaterally. Although several of these lesions are low-attenuation, compatible with cysts, other lesions are intermediate to high attenuation, potentially enhancing  . S/P aortic valve replacement with bioprosthetic valve  12/02/2016   23 mm Edwards Intuity rapid deployment bovine pericardial tissue valve  . S/P resection of left atrial mass 12/02/2016   Benign dystrophic fibrotic tissue with calcification attached to posterior mitral annulus by thin fibrotic stalk  . Stroke ([place])   . TIA (transient ischemic attack)    a. 11/2016 in setting of complete heart block.   Past Surgical History:  Past Surgical History:  Procedure Laterality Date  . AORTIC VALVE REPLACEMENT N/A 12/02/2016   Procedure: AORTIC VALVE REPLACEMENT (AVR) using a 23 Edwards Intuity Elite Aortic Valve;  Surgeon: Rexene Alberts, MD;  Location: [place];  Service: Open Heart Surgery;  Laterality: N/A;  . EXCISION OF ATRIAL MYXOMA N/A 12/02/2016   Procedure: Resection of left atrial mass ;  Surgeon: Rexene Alberts, MD;  Location: [place];  Service: Open Heart Surgery;  Laterality: N/A;  . PACEMAKER IMPLANT N/A 11/21/2016   Procedure: Pacemaker Implant;  Surgeon: Will [name], MD;  Location: [place] CV LAB;  Service: Cardiovascular;  Laterality: N/A;  . RIGHT/LEFT HEART CATH AND CORONARY ANGIOGRAPHY N/A 11/25/2016   Procedure: Right/Left Heart Cath and Coronary Angiography;  Surgeon: Nelva Bush, MD;  Location: [place] CV LAB;  Service: Cardiovascular;  Laterality: N/A;  . TEE WITHOUT CARDIOVERSION N/A 11/23/2016   Procedure: TRANSESOPHAGEAL ECHOCARDIOGRAM (TEE);  Surgeon: Jerline Pain, MD;  Location: [place];  Service: Cardiovascular;  Laterality: N/A;  . TEE WITHOUT CARDIOVERSION N/A 12/02/2016   Procedure: TRANSESOPHAGEAL ECHOCARDIOGRAM (TEE);  Surgeon: Rexene Alberts, MD;  Location: [place];  Service: Open Heart Surgery;  Laterality: N/A;  . TEE WITHOUT CARDIOVERSION N/A 08/22/2018   Procedure: TRANSESOPHAGEAL ECHOCARDIOGRAM (TEE);  Surgeon: Josue Hector, MD;  [facility] ENDOSCOPY;  Service: Cardiovascular;  Laterality: N/A;   HPI:  James Curtis is a 78 y.o. male with medical history significant of complete heart block, s/p  bioprosthetic AoR, PAF who has previously declined long term anti-coagulation. Pt with 2 falls in last week and call his son to help him and noticed that his speech pattern was changed: slurred words, slower speech and cognitive change. 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   Assessment / Plan / Recommendation Clinical Impression  Pt was seen for a cognitive-linguistic evaluation in the setting of an acute R para median pons infarct.  Pt reported that he lives alone and that his son and additional family live near by.  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  Family was not available to establish cognitive baseline.  Pt was oriented to self, age, month, and day of the week.  He was not oriented to year, date, place, or city.  Pt presents with mild dysarthria c/b reduced speech intelligibility at the word, sentence, and conversation level.  Pt's speech is approximately 85-90% intelligible to an unfamiliar listener, increasing with use of over-articulation strategy.  Pt additionally presents with expressive/receptive language deficits c/b difficulty with complex command following, complex yes/no questions, sentence repetition, and complex confrontational naming.  During conversational speech, pt was observed to exhibit anomia and halting speech.  Pt additionally presents with cognitive deficits in the areas of short-term memory, attention, problem solving, and safety judgment; however, suspect that cognitive abilities are influenced by aphasia.   Recommend additional Speech Therapy and 24/7 supervision at time of discharge.  Pt will additionally require assistance with IADLs (medication, finances, etc.) at time of discharge.      SLP Assessment  SLP Recommendation/Assessment: Patient needs continued Speech Lanaguage Pathology Services SLP Visit  Diagnosis: Cognitive communication deficit (R41.841);Dysarthria and anarthria (R47.1)    Follow Up Recommendations  Home health SLP;24 hour supervision/assistance    Frequency and Duration min 2x/week  2 weeks      SLP Evaluation Cognition  Overall Cognitive Status: Impaired/Different from baseline Arousal/Alertness: Awake/alert Orientation Level: Oriented to person;Oriented to time;Disoriented to place Attention: Sustained Sustained Attention: Impaired Sustained Attention Impairment: Verbal basic Memory: Impaired Memory Impairment: Storage deficit;Retrieval deficit;Decreased short term memory Decreased Short Term Memory: Verbal basic Awareness: Impaired Problem Solving: Impaired Problem Solving Impairment: Verbal basic;Functional basic Executive Function: Sequencing;Organizing Sequencing: Impaired Organizing: Impaired Safety/Judgment: Impaired       Conservation officer, historic buildings Overall Auditory Comprehension: Impaired Yes/No Questions: Impaired Basic Biographical Questions: 76-100% accurate Basic Immediate Environment Questions: 75-100% accurate Complex Questions: 75-100% accurate Commands: Impaired One Step Basic Commands: 75-100% accurate Two Step Basic Commands: 75-100% accurate Multistep Basic Commands: 75-100% accurate Conversation: Complex EffectiveTechniques: Extra processing time;Repetition    Expression Expression Primary Mode of Expression: Verbal Verbal Expression Overall Verbal Expression: Impaired Repetition: Impaired Level of Impairment: Sentence level Naming: Impairment Confrontation: Impaired Pragmatics: No impairment Interfering Components: Attention Effective Techniques: Semantic cues;Phonemic cues Written Expression Dominant Hand: Right   Oral / Motor  Oral Motor/Sensory Function Overall Oral Motor/Sensory Function: Within functional limits Motor Speech Overall Motor Speech: Impaired Respiration: Within functional  limits Phonation: Normal Resonance: Within functional limits Articulation: Impaired Level of Impairment: Word Intelligibility: Intelligibility reduced Word: 75-100% accurate Phrase: 75-100% accurate Sentence: 75-100% accurate Conversation: 75-100% accurate Effective Techniques: Slow rate;Over-articulate    Bretta Bang, M.S., [place] [facility] Office: [phone]                    [place] 06/12/2019, 12:26 PM

## 2019-06-12 NOTE — Progress Notes (Signed)
  Echocardiogram 2D Echocardiogram has been performed.  Batul Diego A Aryah Doering 06/12/2019, 5:44 PM

## 2019-06-12 NOTE — Progress Notes (Signed)
Occupational Therapy Evaluation Patient Details Name: James Curtis MRN: [id number] DOB: [birth year] Today's Date: 06/12/2019    History of Present Illness James Curtis is a 78 y.o. male with medical history significant of complete heart block, s/p bioprosthetic AoR, PAF who has previously declined long term anti-coagulation. Pt with 2 falls in last week and call his son to help him and noticed that his speech pattern was changed: slurred words, slower speech and cognitive change. 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   Clinical Impression   PTA, pt was living at home alone with family available to assist with ADL/IADL prn. Pt reports he was independent with functional mobility and still drives. Pt currently requires minA for ADL and functional mobility. Pt demonstrates cognitive limitations impacting his safety and independence with ADL/IADL and functional mobility. Due to decline in current level of function, pt would benefit from acute OT to address established goals to facilitate safe D/C to venue listed below. At this time, recommend HHOT with 24/7 physical assistance follow-up. Will continue to follow acutely.     Follow Up Recommendations  Home health OT;Supervision/Assistance - 24 hour    Equipment Recommendations  3 in 1 bedside commode    Recommendations for Other Services       Precautions / Restrictions Precautions Precautions: Fall Restrictions Weight Bearing Restrictions: No      Mobility Bed Mobility Overal bed mobility: Needs Assistance Bed Mobility: Supine to Sit     Supine to sit: Min guard;HOB elevated     General bed mobility comments: minguard for safety and vc to use guard rails  Transfers Overall transfer level: Needs assistance Equipment used: Rolling walker (2 wheeled) Transfers: Sit to/from Stand Sit to Stand: Min assist         General transfer comment: minA in standing for stability;pt utlized RW while standing for stability  while performing pericare    Balance Overall balance assessment: Needs assistance Sitting-balance support: No upper extremity supported;Feet supported Sitting balance-Leahy Scale: Normal     Standing balance support: Single extremity supported;During functional activity Standing balance-Leahy Scale: Fair Standing balance comment: decreased stability during functional task requiring minA from therapist and single UE support on RW                            ADL either performed or assessed with clinical judgement   ADL Overall ADL's : Needs assistance/impaired Eating/Feeding: Set up;Sitting Eating/Feeding Details (indicate cue type and reason): required increased time and effort to open containers Grooming: Set up;Sitting   Upper Body Bathing: Set up;Sitting   Lower Body Bathing: Sit to/from stand;Minimal assistance   Upper Body Dressing : Set up;Sitting   Lower Body Dressing: Sit to/from stand;Minimal assistance   Toilet Transfer: Minimal assistance;RW;Stand-pivot Toilet Transfer Details (indicate cue type and reason): simulated to recliner Toileting- Clothing Manipulation and Hygiene: Sit to/from stand;Minimal assistance Toileting - Clothing Manipulation Details (indicate cue type and reason): pt completed pericare in standing with minA for stability     Functional mobility during ADLs: Minimal assistance;Rolling walker General ADL Comments: decreased stability and increased DoE 2/4 with ADL in standing     Vision Baseline Vision/History: No visual deficits Patient Visual Report: No change from baseline Vision Assessment?: Yes Eye Alignment: Within Functional Limits Ocular Range of Motion: Within Functional Limits Alignment/Gaze Preference: Within Defined Limits Tracking/Visual Pursuits: Requires cues, head turns, or add eye shifts to track Saccades: Undershoots;Additional head turns occurred during testing;Additional  eye shifts occurred during  testing Convergence: Impaired (comment)(L eye does not converge unless R eye occluded) Visual Fields: No apparent deficits     Perception     Praxis      Pertinent Vitals/Pain Pain Assessment: No/denies pain Faces Pain Scale: No hurt     Hand Dominance Right   Extremity/Trunk Assessment Upper Extremity Assessment Upper Extremity Assessment: LUE deficits/detail LUE Deficits / Details: grossly 4/5, mild fine motor deficit in hand;decreased in hand manipulation skills;able to use functionally to open containers with increased time and effort LUE Sensation: WNL LUE Coordination: decreased fine motor;decreased gross motor   Lower Extremity Assessment Lower Extremity Assessment: Defer to PT evaluation   Cervical / Trunk Assessment Cervical / Trunk Assessment: Normal   Communication Communication Communication: Expressive difficulties(word finding difficulties)   Cognition Arousal/Alertness: Awake/alert Behavior During Therapy: Flat affect Overall Cognitive Status: Impaired/Different from baseline Area of Impairment: Problem solving;Attention;Awareness                   Current Attention Level: Sustained       Awareness: Emergent Problem Solving: Slow processing;Requires verbal cues;Requires tactile cues;Difficulty sequencing General Comments: pt very delayed in processing and noted impaired comprehension of questions asked;demonstrated difficulty navigating the menu;difficulty sequencing   General Comments  VSS;condom cath not attatched securely, pt in soiled linens, stated he was going to wait for RN to come in to tell her    Exercises     Shoulder Instructions      Home Living Family/patient expects to be discharged to:: Private residence Living Arrangements: Alone Available Help at Discharge: Family;Available PRN/intermittently Type of Home: House Home Access: Level entry     Home Layout: One level     Bathroom Shower/Tub: Animal nutritionist: Standard     Home Equipment: Grab bars - tub/shower   Additional Comments: questionable accuracy of home set up. Pt unable to decribe entry stating he has steps but you don''t have to go up or down and unable to state if he had shower or tub/shower, pt with noted word finding difficulty and impaired comprehension of question, unsure if this is baseline or new   Lives With: Alone    Prior Functioning/Environment Level of Independence: Independent        Comments: pt states he still drives        OT Problem List: Decreased range of motion;Decreased activity tolerance;Impaired balance (sitting and/or standing);Decreased cognition;Decreased safety awareness;Decreased coordination;Impaired vision/perception;Decreased knowledge of precautions;Decreased knowledge of use of DME or AE      OT Treatment/Interventions: Self-care/ADL training;Therapeutic exercise;Energy conservation;DME and/or AE instruction;Therapeutic activities;Cognitive remediation/compensation;Visual/perceptual remediation/compensation;Patient/family education;Balance training    OT Goals(Current goals can be found in the care plan section) Acute Rehab OT Goals Patient Stated Goal: to go home OT Goal Formulation: With patient Time For Goal Achievement: 06/26/19 Potential to Achieve Goals: Good ADL Goals Pt Will Perform Grooming: with modified independence Pt Will Perform Upper Body Dressing: with modified independence;sitting Pt Will Perform Lower Body Dressing: with modified independence;sit to/from stand Pt Will Transfer to Toilet: with modified independence;ambulating Additional ADL Goal #1: Pt will complete 10 step ADL with min vc for sequencing and initiation.  OT Frequency: Min 2X/week   Barriers to D/C: Decreased caregiver support  pt lives alone       Co-evaluation              AM-PAC OT "6 Clicks" Daily Activity     Outcome Measure Help from another person  eating meals?: A  Little Help from another person taking care of personal grooming?: A Little Help from another person toileting, which includes using toliet, bedpan, or urinal?: A Little Help from another person bathing (including washing, rinsing, drying)?: A Little Help from another person to put on and taking off regular upper body clothing?: A Little Help from another person to put on and taking off regular lower body clothing?: A Little 6 Click Score: 18   End of Session Equipment Utilized During Treatment: Rolling walker;Gait belt Nurse Communication: Mobility status(codom cath off)  Activity Tolerance: Patient tolerated treatment well Patient left: in chair;with call bell/phone within reach;with chair alarm set  OT Visit Diagnosis: Unsteadiness on feet (R26.81);Other abnormalities of gait and mobility (R26.89);Muscle weakness (generalized) (M62.81);Cognitive communication deficit (R41.841) Symptoms and signs involving cognitive functions: Cerebral infarction                Time: 4859-2763 OT Time Calculation (min): 35 min Charges:  OT General Charges $OT Visit: 1 Visit OT Evaluation $OT Eval Moderate Complexity: 1 Mod OT Treatments $Self Care/Home Management : 8-22 mins  Dorinda Hill OTR/L Acute Rehabilitation Services Office: [facility] [place] 06/12/2019, 1:50 PM
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
## 2019-06-12 NOTE — Evaluation (Signed)
Physical Therapy Evaluation Patient Details Name: James Curtis MRN: [id number] DOB: [birth year] Today's Date: 06/12/2019   History of Present Illness  James Curtis is a 78 y.o. male with medical history significant of complete heart block, s/p bioprosthetic AoR, PAF who has previously declined long term anti-coagulation. Pt with 2 falls in last week and call his son to help him and noticed that his speech pattern was changed: slurred words, slower speech and cognitive change. 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  Clinical Impression  Pt admitted with above presenting with mild L sided weakness, word finding difficulty and slurred speech in addition to delayed responsive and impaired comprehension. Pt functioning at minA with mobility. Recommend 24/7 assist upon initial d/c home as pt is at increased falls risk and has impaired cognition. Acute PT to cont to follow.    Follow Up Recommendations Home health PT;Supervision/Assistance - 24 hour(24/7 initially, reports son can provide)    Equipment Recommendations  None recommended by PT    Recommendations for Other Services       Precautions / Restrictions Precautions Precautions: Fall Restrictions Weight Bearing Restrictions: No      Mobility  Bed Mobility               General bed mobility comments: pt sitting EOB upon PT arrival  Transfers Overall transfer level: Needs assistance Equipment used: None Transfers: Sit to/from Stand Sit to Stand: Min guard         General transfer comment: pt pushed up from bed, increased time, wide base of support  Ambulation/Gait Ambulation/Gait assistance: Min guard;Min assist Gait Distance (Feet): 130 Feet Assistive device: None Gait Pattern/deviations: Step-through pattern;Decreased stride length;Shuffle Gait velocity: dec Gait velocity interpretation: 1.31 - 2.62 ft/sec, indicative of limited community ambulator General Gait Details: pt initially unsteady  reaching for sink to hold onto requiring minA then transitioned to more steady gait pattern and min guard assist, pt reports "my legs feel weak"  Stairs            Wheelchair Mobility    Modified Rankin (Stroke Patients Only) Modified Rankin (Stroke Patients Only) Pre-Morbid Rankin Score: No significant disability Modified Rankin: Moderate disability     Balance Overall balance assessment: Needs assistance Sitting-balance support: No upper extremity supported;Feet supported Sitting balance-Leahy Scale: Normal Sitting balance - Comments: pt at breakfast at EOB   Standing balance support: No upper extremity supported Standing balance-Leahy Scale: Fair Standing balance comment: pt stood at sink to wash hands without UE support or leaning up against sink                             Pertinent Vitals/Pain Pain Assessment: No/denies pain    Home Living Family/patient expects to be discharged to:: Private residence Living Arrangements: Alone Available Help at Discharge: Family;Available PRN/intermittently(but states son can stay 24/7 for a short period of time) Type of Home: House Home Access: Level entry     Home Layout: One level Home Equipment: Grab bars - tub/shower Additional Comments: questionable accuracy of home set up. Pt unable to decribe entry stating he has steps but you don''t have to go up or down and unable to state if he had shower or tub/shower, pt with noted word finding difficulty and impaired comprehension of question, unsure if this is baseline or new     Prior Function Level of Independence: Independent         Comments: pt states  he still drives     Hand Dominance   Dominant Hand: Right    Extremity/Trunk Assessment   Upper Extremity Assessment Upper Extremity Assessment: LUE deficits/detail LUE Deficits / Details: grossly 4/5, mild fine motor deficit in hand    Lower Extremity Assessment Lower Extremity Assessment: LLE  deficits/detail LLE Deficits / Details: grossly 4/5 compared to R    Cervical / Trunk Assessment Cervical / Trunk Assessment: Normal  Communication   Communication: Expressive difficulties(word finding difficulties)  Cognition Arousal/Alertness: Awake/alert Behavior During Therapy: Flat affect Overall Cognitive Status: Impaired/Different from baseline Area of Impairment: Problem solving;Attention;Awareness                   Current Attention Level: Sustained       Awareness: Emergent Problem Solving: Slow processing;Requires verbal cues;Requires tactile cues General Comments: pt very delayed in processing and noted impaired comprehension of questions asked      General Comments General comments (skin integrity, edema, etc.): VSS    Exercises     Assessment/Plan    PT Assessment Patient needs continued PT services  PT Problem List Decreased strength;Decreased range of motion;Decreased activity tolerance;Decreased balance;Decreased mobility;Decreased coordination;Decreased cognition;Decreased safety awareness;Decreased knowledge of use of DME       PT Treatment Interventions DME instruction;Gait training;Stair training;Functional mobility training;Therapeutic activities;Therapeutic exercise;Balance training;Cognitive remediation;Neuromuscular re-education    PT Goals (Current goals can be found in the Care Plan section)  Acute Rehab PT Goals Patient Stated Goal: home PT Goal Formulation: With patient Time For Goal Achievement: 06/26/19 Potential to Achieve Goals: Good    Frequency Min 4X/week   Barriers to discharge        Co-evaluation               AM-PAC PT "6 Clicks" Mobility  Outcome Measure Help needed turning from your back to your side while in a flat bed without using bedrails?: None Help needed moving from lying on your back to sitting on the side of a flat bed without using bedrails?: None Help needed moving to and from a bed to a chair  (including a wheelchair)?: A Little Help needed standing up from a chair using your arms (e.g., wheelchair or bedside chair)?: A Little Help needed to walk in hospital room?: A Little Help needed climbing 3-5 steps with a railing? : A Lot 6 Click Score: 19    End of Session Equipment Utilized During Treatment: Gait belt Activity Tolerance: Patient tolerated treatment well Patient left: in chair;with nursing/sitter in room Nurse Communication: Mobility status PT Visit Diagnosis: Unsteadiness on feet (R26.81);Muscle weakness (generalized) (M62.81);Difficulty in walking, not elsewhere classified (R26.2)    Time: 2683-4196 PT Time Calculation (min) (ACUTE ONLY): 31 min   Charges:   PT Evaluation $PT Eval Moderate Complexity: 1 Mod PT Treatments $Gait Training: 8-22 mins        Kittie Plater, PT, DPT Acute Rehabilitation Services Pager #: [phone] Office #: [phone]   Berline Lopes 06/12/2019, 10:53 AM

## 2019-06-13 LAB — LIPID PANEL
Cholesterol: 161 mg/dL (ref 0–200)
HDL: 33 mg/dL — ABNORMAL LOW (ref >40)
LDL Cholesterol: 114 mg/dL — ABNORMAL HIGH (ref 0–99)
Total CHOL/HDL Ratio: 4.9 RATIO
Triglycerides: 68 mg/dL (ref <150)
VLDL: 14 mg/dL (ref 0–40)

## 2019-06-13 LAB — CBC WITH DIFFERENTIAL/PLATELET
Abs Immature Granulocytes: 0.02 10*3/uL (ref 0.00–0.07)
Basophils Absolute: 0 10*3/uL (ref 0.0–0.1)
Basophils Relative: 1 %
Eosinophils Absolute: 0.1 10*3/uL (ref 0.0–0.5)
Eosinophils Relative: 3 %
HCT: 23.8 % — ABNORMAL LOW (ref 39.0–52.0)
Hemoglobin: 8 g/dL — ABNORMAL LOW (ref 13.0–17.0)
Immature Granulocytes: 0 %
Lymphocytes Relative: 13 %
Lymphs Abs: 0.7 10*3/uL (ref 0.7–4.0)
MCH: 30.1 pg (ref 26.0–34.0)
MCHC: 33.6 g/dL (ref 30.0–36.0)
MCV: 89.5 fL (ref 80.0–100.0)
Monocytes Absolute: 0.5 10*3/uL (ref 0.1–1.0)
Monocytes Relative: 9 %
Neutro Abs: 3.9 10*3/uL (ref 1.7–7.7)
Neutrophils Relative %: 74 %
Platelets: 146 10*3/uL — ABNORMAL LOW (ref 150–400)
RBC: 2.66 MIL/uL — ABNORMAL LOW (ref 4.22–5.81)
RDW: 14.5 % (ref 11.5–15.5)
WBC: 5.3 10*3/uL (ref 4.0–10.5)
nRBC: 0 % (ref 0.0–0.2)

## 2019-06-13 LAB — BASIC METABOLIC PANEL
Anion gap: 9 (ref 5–15)
BUN: 22 mg/dL (ref 8–23)
CO2: 19 mmol/L — ABNORMAL LOW (ref 22–32)
Calcium: 8.7 mg/dL — ABNORMAL LOW (ref 8.9–10.3)
Chloride: 112 mmol/L — ABNORMAL HIGH (ref 98–111)
Creatinine, Ser: 1.95 mg/dL — ABNORMAL HIGH (ref 0.61–1.24)
GFR calc Af Amer: 37 mL/min — ABNORMAL LOW (ref >60)
GFR calc non Af Amer: 32 mL/min — ABNORMAL LOW (ref >60)
Glucose, Bld: 128 mg/dL — ABNORMAL HIGH (ref 70–99)
Potassium: 3.9 mmol/L (ref 3.5–5.1)
Sodium: 140 mmol/L (ref 135–145)

## 2019-06-13 LAB — GLUCOSE, CAPILLARY: Glucose-Capillary: 117 mg/dL — ABNORMAL HIGH (ref 70–99)

## 2019-06-13 MED ORDER — CLOPIDOGREL BISULFATE 75 MG PO TABS: 75.0000 mg | ORAL_TABLET | Freq: Every day | ORAL | 0 refills | Status: AC

## 2019-06-13 MED ORDER — ASPIRIN EC 81 MG PO TBEC: 81.0000 mg | DELAYED_RELEASE_TABLET | Freq: Every day | ORAL | 0 refills | Status: AC

## 2019-06-13 MED ORDER — ATORVASTATIN CALCIUM 40 MG PO TABS: 40.0000 mg | ORAL_TABLET | Freq: Every day | ORAL | Status: DC

## 2019-06-13 MED ORDER — ATORVASTATIN CALCIUM 40 MG PO TABS: 40.0000 mg | ORAL_TABLET | Freq: Every day | ORAL | 0 refills | Status: AC

## 2019-06-13 NOTE — TOC Transition Note (Signed)
Transition of Care [facility]) - CM/SW Discharge Note   Patient Details  Name: James Curtis MRN: [id number] Date of Birth: 1941/04/13  [facility] CM/SW Contact:  Pollie Friar, RN Phone Number: 06/13/2019, 12:14 PM   Clinical Narrative:    Pt discharging home with [facility] services through [place]. Cory with [facility] accepted the referral.  Pt's son states they already have a 3 n 1 at the home.  Son to provide 24 hour supervision at home and transportation to home.   Final next level of care: Home w Home Health Services Barriers to Discharge: No Barriers Identified   Patient Goals and CMS Choice   CMS Medicare.gov Compare Post Acute Care list provided to:: Patient Represenative (must comment) Choice offered to / list presented to : Adult Children(son)  Discharge Placement                       Discharge Plan and Services                          HH Arranged: PT, OT, RN, Speech Therapy HH Agency: [place] Date [place]: 06/13/19   Representative spoke with at [place]: [place] ([place]) Interventions     Readmission Risk Interventions No flowsheet data found.

## 2019-06-13 NOTE — Progress Notes (Signed)
Patient discharging home with home health. Son here for transport home. All discharge paperwork went over with son and patient. All questions and concerns addressed. IV taken out. Son for transport home. [place]

## 2019-06-13 NOTE — Progress Notes (Signed)
Physical Therapy Treatment Patient Details Name: James Curtis MRN: [id number] DOB: 1941-02-13 Today's Date: 06/13/2019    History of Present Illness Naveen Clardy is a 78 y.o. male with medical history significant of complete heart block, s/p bioprosthetic AoR, PAF who has previously declined long term anti-coagulation. Pt with 2 falls in last week and call his son to help him and noticed that his speech pattern was changed: slurred words, slower speech and cognitive change. 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    PT Comments    Patient is progressing well towards their physical therapy goals. Ambulating 300 feet with walker at a min guard assist level (walker mainly used for catheter management). Demonstrates mild gait abnormalities and left sided functional strength deficits. Focused on serial sit to stands without hand support to address. Pt continues with significant communication and cognitive deficits; will need 24/7 supervision upon discharge.     Follow Up Recommendations  Home health PT;Supervision/Assistance - 24 hour     Equipment Recommendations  None recommended by PT    Recommendations for Other Services       Precautions / Restrictions Precautions Precautions: Fall Restrictions Weight Bearing Restrictions: No    Mobility  Bed Mobility Overal bed mobility: Modified Independent                Transfers Overall transfer level: Needs assistance Equipment used: Rolling walker (2 wheeled);None Transfers: Sit to/from Stand Sit to Stand: Supervision         General transfer comment: Able to complete from bed and recliner with and without hand support  Ambulation/Gait Ambulation/Gait assistance: Min guard Gait Distance (Feet): 300 Feet Assistive device: Rolling walker (2 wheeled) Gait Pattern/deviations: Step-through pattern;Decreased stance time - left     General Gait Details: Requires max cues for feet staying inside of walker on  turns, able to negotiate obstacles with min guard assist   Stairs             Wheelchair Mobility    Modified Rankin (Stroke Patients Only) Modified Rankin (Stroke Patients Only) Pre-Morbid Rankin Score: No significant disability Modified Rankin: Moderately severe disability     Balance Overall balance assessment: Needs assistance Sitting-balance support: No upper extremity supported;Feet supported Sitting balance-Leahy Scale: Normal     Standing balance support: Single extremity supported;During functional activity Standing balance-Leahy Scale: Fair Standing balance comment: able to adjust mask with  supervision                            Cognition Arousal/Alertness: Awake/alert Behavior During Therapy: Flat affect Overall Cognitive Status: Impaired/Different from baseline Area of Impairment: Problem solving;Attention;Awareness;Memory;Orientation                 Orientation Level: Disoriented to;Place;Time;Situation Current Attention Level: Selective Memory: Decreased short-term memory     Awareness: Emergent Problem Solving: Slow processing;Requires verbal cues;Requires tactile cues;Difficulty sequencing General Comments: Pt A&Ox1, oriented to self only; assessment could be impacted by aphasia as well. Impaired short term memory noted; did not remember eating breakfast or what he had for breakfast. Working memory intact as he was able to dual task and way find in hallway.       Exercises Other Exercises Other Exercises: x10 Sit to stands with hands on knees for functional strength training    General Comments  HR peak 87 bpm      Pertinent Vitals/Pain Pain Assessment: Faces Faces Pain Scale: No hurt  Home Living                      Prior Function            PT Goals (current goals can now be found in the care plan section) Acute Rehab PT Goals Patient Stated Goal: to go home Potential to Achieve Goals: Good Progress  towards PT goals: Progressing toward goals    Frequency    Min 4X/week      PT Plan Current plan remains appropriate    Co-evaluation              AM-PAC PT "6 Clicks" Mobility   Outcome Measure  Help needed turning from your back to your side while in a flat bed without using bedrails?: None Help needed moving from lying on your back to sitting on the side of a flat bed without using bedrails?: None Help needed moving to and from a bed to a chair (including a wheelchair)?: A Little Help needed standing up from a chair using your arms (e.g., wheelchair or bedside chair)?: A Little Help needed to walk in hospital room?: A Little Help needed climbing 3-5 steps with a railing? : A Little 6 Click Score: 20    End of Session Equipment Utilized During Treatment: Gait belt Activity Tolerance: Patient tolerated treatment well Patient left: in chair;with call bell/phone within reach;with chair alarm set   PT Visit Diagnosis: Unsteadiness on feet (R26.81);Muscle weakness (generalized) (M62.81);Difficulty in walking, not elsewhere classified (R26.2)     Time: 4403-4742 PT Time Calculation (min) (ACUTE ONLY): 22 min  Charges:  $Therapeutic Activity: 8-22 mins                     Ellamae Sia, PT, DPT Acute Rehabilitation Services Pager [phone] Office [phone]    Willy Eddy 06/13/2019, 9:14 AM

## 2019-06-13 NOTE — Discharge Summary (Addendum)
Physician Discharge Summary  James Curtis IOX:[id number] DOB: 03-15-1941 DOA: 06/10/2019  PCP: [name], MD  Admit date: 06/10/2019 Discharge date: 06/13/2019  Admitted From: Home Disposition: Home  Recommendations for Outpatient Follow-up:  1. Follow up with PCP in 1-2 weeks 2. Follow-up with neurology in 2 weeks 3. Please obtain BMP/CBC in one week 4. Please follow up on the following pending results:  Home Health: Yes Equipment/Devices: Bedside commode  Discharge Condition: Stable CODE STATUS: Full code Diet recommendation: Cardiac  Subjective: Patient seen and examined.  No complaints.  Speech much improved compared yesterday.  No more left facial droop on exam.  Brief/Interim Summary: James Curtis a 78 y.o.malewith medical history significant ofcomplete heart block, s/p bioprosthetic AoR, PAF who has previously declined long term anti-coagulation. Over the 24 hrs prior to admission the patient had a fall while struggling to get out of bed to the bathroom - a new problem. He had a second fall which he describes as the floor seemed to be moving and he could not maintain his balance and went down. He was awake and recalls hitting the floor with his buttock. He also struck his knee. His son was called to assist him and noticed that his speech pattern was changed: slurred words, slower speech and cognitive change. Due to these symptoms the son called EMS and he was transported to [facility]  Upon arrival to ED, he was hemodynamically stable,labs with normal WBC, stable chronic anemia, stable CKD IV with Creatiine 1.90, GFR 31, CT head negative, hip and back films negative. He had a non-focal neuro exam.  He was admitted to hospital service for further work-up of possible stroke.  He underwent MRI which showed right-sided pontine stroke.  On my examination, patient did have slurred speech and some left facial droop.  He was evaluated by neurology.  Underwent transthoracic echo  which showed normal ejection fraction, no wall motion abnormality and no ASD or VSD.  Also underwent MR angiogram of the head which did not show any occlusion.  He was already on aspirin 81 mg.  He was started on Plavix and atorvastatin per neurology recommendation.  He was evaluated by PT OT and they recommended home health which has been arranged for patient in agreement with patient son [name] who is power of attorney as well.   I discussed the case with Dr. Leonie Man from neurology over the phone and per his recommendations, please note that patient is supposed to take both aspirin and Plavix for 3 weeks and then stop aspirin and continue Plavix afterwards.  He needs to see his PCP within a week and neurology in 2 to 3 weeks.  Discharge Diagnoses:  Active Problems:   Essential hypertension   Dementia (HCC)   Type II diabetes mellitus (HCC)   S/P AVR (aortic valve replacement)   Paroxysmal atrial fibrillation (HCC)   Acute ischemic stroke [facility])    Discharge Instructions  Discharge Instructions    Discharge patient   Complete by: As directed    Discharge disposition: 06-Home-Health Care Svc   Discharge patient date: 06/13/2019     Allergies as of 06/13/2019   No Known Allergies     Medication List    TAKE these medications   amLODipine 10 MG tablet Commonly known as: NORVASC Take 10 mg by mouth daily.   amoxicillin 500 MG tablet Commonly known as: AMOXIL Take 4 tablets (2 grams) by mouth 30-60 minutes prior to dental visit.   aspirin EC 81 MG tablet Take  1 tablet (81 mg total) by mouth daily for 21 days.   atorvastatin 40 MG tablet Commonly known as: LIPITOR Take 1 tablet (40 mg total) by mouth daily at 6 PM.   carvedilol 6.25 MG tablet Commonly known as: COREG TAKE 1 TABLET BY MOUTH TWICE A DAY   CAYENNE PEPPER PO Take 1 capsule by mouth daily. Takes 1 tablet po as directed. This is a complex tablet along with ginko biloba   cholecalciferol 1000 units  tablet Commonly known as: VITAMIN D Take 1,000 Units by mouth daily.   clopidogrel 75 MG tablet Commonly known as: Plavix Take 1 tablet (75 mg total) by mouth daily.   folic acid 1 MG tablet Commonly known as: FOLVITE TAKE 3 TABLETS BY MOUTH DAILY What changed:   how much to take  when to take this  additional instructions   Ginkgo Biloba 40 MG Tabs Take by mouth. Unsure dose. Complex tablet along with cayenne pepper. Takes as directed.   losartan 100 MG tablet Commonly known as: COZAAR Take 1 tablet (100 mg total) by mouth daily.   OMEGA 3 PO Take 1 capsule by mouth daily.   Sage Leaf Powd Take 3 capsules by mouth daily.   VISINE OP Place 1 drop into both eyes daily as needed (dry , itchy eyes).      Follow-up Information    [name], MD Follow up in 1 week(s).   Specialty: Internal Medicine Contact information: [address] [place] [address] [phone]        Sherren Mocha, MD .   Specialty: Cardiology Contact information: [phone] [address] [place] [address] [phone]        Constance Haw, MD .   Specialty: Cardiology Contact information: [place] [address] [phone]        [facility] Follow up in 2 week(s).          No Known Allergies  Consultations: Neurology   Procedures/Studies: Dg Cervical Spine 2-3 Views  Result Date: 06/11/2019 CLINICAL DATA:  Posterior neck pain after fall EXAM: CERVICAL SPINE - 2-3 VIEW COMPARISON:  None. FINDINGS: Limited exam on lateral view secondary to overlapping structures/patient positioning. Straightening of the cervical lordosis. No fracture visualized. Dens and lateral masses appear aligned. Multilevel intervertebral disc height loss. No definite prevertebral soft tissue swelling, although evaluation is somewhat limited. IMPRESSION: Limited exam. No obvious fracture or malalignment of the cervical spine. If  high clinical suspicion for fracture remains, CT should be considered. Electronically Signed   By: Davina Poke M.D.   On: 06/11/2019 10:16   Dg Lumbar Spine 2-3 Views  Result Date: 06/11/2019 CLINICAL DATA:  Back pain after fall EXAM: LUMBAR SPINE - 2-3 VIEW COMPARISON:  None. FINDINGS: Five lumbar type vertebral segments. Vertebral body heights and alignment are maintained. No fracture identified. Intervertebral disc spaces are relatively preserved. Minimal degenerative endplate changes. Mild lower lumbar facet arthrosis. Degenerative change of the right SI joint. Abdominal aortic atherosclerotic calcifications. IMPRESSION: No acute osseous abnormality of the lumbar spine. Electronically Signed   By: Davina Poke M.D.   On: 06/11/2019 10:14   Ct Head Wo Contrast  Result Date: 06/10/2019 CLINICAL DATA:  Slurred speech and altered mental status EXAM: CT HEAD WITHOUT CONTRAST TECHNIQUE: Contiguous axial images were obtained from the base of the skull through the vertex without intravenous contrast. COMPARISON:  None. FINDINGS: Brain: There is no mass, hemorrhage or extra-axial collection. The  size and configuration of the ventricles and extra-axial CSF spaces are normal. There is hypoattenuation of the white matter, most commonly indicating chronic small vessel disease. Vascular: No abnormal hyperdensity of the major intracranial arteries or dural venous sinuses. No intracranial atherosclerosis. Skull: The visualized skull base, calvarium and extracranial soft tissues are normal. Sinuses/Orbits: No fluid levels or advanced mucosal thickening of the visualized paranasal sinuses. No mastoid or middle ear effusion. The orbits are normal. IMPRESSION: Chronic small vessel disease without acute intracranial abnormality. Electronically Signed   By: Ulyses Jarred M.D.   On: 06/10/2019 23:18   Mr Angio Head Wo Contrast  Result Date: 06/12/2019 CLINICAL DATA:  Focal neuro deficit, greater than 6 hours,  stroke suspected. Additional history provided: Abnormal MRI, stroke, slurred speech EXAM: MRA HEAD WITHOUT CONTRAST TECHNIQUE: Angiographic images of the Circle of Willis were obtained using MRA technique without intravenous contrast. COMPARISON:  Brain MRI 06/11/2019, head CT 06/10/2019, MRI/MRA head 11/19/2016 FINDINGS: The intracranial internal carotid arteries are patent. Atherosclerotic irregularity of the cavernous/paraclinoid internal carotid arteries, predominantly on the right without significant stenosis. The right middle cerebral artery is patent without significant proximal stenosis. Apparent moderate focal stenosis versus artifact from vessel tortuosity at the origin of the right anterior cerebral artery. The left middle and anterior cerebral arteries are patent without significant proximal stenosis. 2 mm inferiorly projecting aneurysm versus infundibulum arising from the supraclinoid right internal carotid artery (series 3, image 72) (series 304, image 17). The intracranial vertebral arteries are patent without significant stenosis, as is the basilar artery. The bilateral posterior cerebral arteries are patent without significant proximal stenosis. Posterior communicating arteries are poorly delineated and may be hypoplastic or absent bilaterally. IMPRESSION: 1. No intracranial large vessel occlusion. 2. Moderate focal stenosis at the origin of the right anterior cerebral artery versus artifact from vessel tortuosity. Otherwise, no significant proximal stenosis. Mild atherosclerotic disease within the intracranial internal carotid arteries. 3. 2 mm in inferiorly projecting aneurysm versus infundibulum arising from the supraclinoid right ICA. Electronically Signed   By: Kellie Simmering DO   On: 06/12/2019 16:43   Mr Brain Wo Contrast  Result Date: 06/11/2019 CLINICAL DATA:  TIA.  Slurred speech.  Altered mental status. EXAM: MRI HEAD WITHOUT CONTRAST TECHNIQUE: Multiplanar, multiecho pulse sequences  of the brain and surrounding structures were obtained without intravenous contrast. COMPARISON:  Head CT yesterday. FINDINGS: Brain: Diffusion imaging shows acute infarction affecting the right para median pons. No other acute finding. Few old small vessel infarctions affecting the pons. Cerebral hemispheres show age related atrophy with mild to moderate chronic small-vessel ischemic change of the deep white matter. No cortical infarction. No mass, hemorrhage, hydrocephalus or extra-axial collection. Vascular: Major vessels at the base of the brain show flow. Skull and upper cervical spine: Negative Sinuses/Orbits: Clear/normal Other: None IMPRESSION: Acute infarction affecting the right para median pons. No swelling or hemorrhage. Mild to moderate chronic small-vessel ischemic changes elsewhere affecting the pons and the cerebral hemispheric white matter. Electronically Signed   By: Nelson Chimes M.D.   On: 06/11/2019 15:55   Dg Hip Unilat W Or Wo Pelvis 2-3 Views Left  Result Date: 06/11/2019 CLINICAL DATA:  Fall.  Back pain. EXAM: DG HIP (WITH OR WITHOUT PELVIS) 2-3V LEFT COMPARISON:  No recent. FINDINGS: Degenerative change lumbar spine and both hips. No acute bony abnormality. Peripheral vascular calcification. IMPRESSION: 1. Degenerative changes lumbar spine and both hips. No acute abnormality. 2.  Peripheral vascular disease. Electronically Signed   By: Marcello Moores  Register   On: 06/11/2019 10:12      Discharge Exam: Vitals:   06/13/19 0404 06/13/19 0808  BP:  (!) 146/59  Pulse:  68  Resp: 16 18  Temp:  98 F (36.7 C)  SpO2:  97%   Vitals:   06/13/19 0000 06/13/19 0400 06/13/19 0404 06/13/19 0808  BP: (!) 145/72 (!) 134/56  (!) 146/59  Pulse: (!) 58 60  68  Resp: 20 (!) 24 16 18   Temp: 97.7 F (36.5 C) 97.7 F (36.5 C)  98 F (36.7 C)  TempSrc: Oral Oral  Oral  SpO2: 100% 100%  97%    General: Pt is alert, awake, not in acute distress Cardiovascular: RRR, S1/S2 +, no rubs, no  gallops Respiratory: CTA bilaterally, no wheezing, no rhonchi Abdominal: Soft, NT, ND, bowel sounds + Extremities: no edema, no cyanosis    The results of significant diagnostics from this hospitalization (including imaging, microbiology, ancillary and laboratory) are listed below for reference.     Microbiology: Recent Results (from the past 240 hour(s))  Urine Culture     Status: Abnormal   Collection Time: 06/11/19 12:01 PM   Specimen: Urine, Clean Catch  Result Value Ref Range Status   Specimen Description URINE, CLEAN CATCH  Final   Special Requests   Final    NONE Performed at [facility], [address] [place], [place] [address]    Culture MULTIPLE SPECIES PRESENT, SUGGEST RECOLLECTION (A)  Final   Report Status 06/12/2019 FINAL  Final  SARS CORONAVIRUS 2 (TAT 6-24 HRS) Nasopharyngeal Nasopharyngeal Swab     Status: None   Collection Time: 06/11/19 12:55 PM   Specimen: Nasopharyngeal Swab  Result Value Ref Range Status   SARS Coronavirus 2 NEGATIVE NEGATIVE Final    Comment: (NOTE) SARS-CoV-2 target nucleic acids are NOT DETECTED. The SARS-CoV-2 RNA is generally detectable in upper and lower respiratory specimens during the acute phase of infection. Negative results do not preclude SARS-CoV-2 infection, do not rule out co-infections with other pathogens, and should not be used as the sole basis for treatment or other patient management decisions. Negative results must be combined with clinical observations, patient history, and epidemiological information. The expected result is Negative. Fact Sheet for Patients: SugarRoll.be Fact Sheet for Healthcare Providers: https://www.woods-mathews.com/ This test is not yet approved or cleared by the [place] FDA and  has been authorized for detection and/or diagnosis of SARS-CoV-2 by FDA under an Emergency Use Authorization (EUA). This EUA will remain  in effect (meaning  this test can be used) for the duration of the COVID-19 declaration under Section 56 4(b)(1) of the Act, 21 U.S.C. section 360bbb-3(b)(1), unless the authorization is terminated or revoked sooner. Performed at [facility], [place] [address], [place], [place] [address]      Labs: BNP (last 3 results) No results for input(s): BNP in the last 8760 hours. Basic Metabolic Panel: Recent Labs  Lab 06/10/19 2216 06/10/19 2247 06/13/19 0348  NA 140 139 140  K 4.0 4.2 3.9  CL 107 106 112*  CO2 23  --  19*  GLUCOSE 122* 117* 128*  BUN 16 19 22   CREATININE 1.98* 1.90* 1.95*  CALCIUM 9.3  --  8.7*   Liver Function Tests: Recent Labs  Lab 06/10/19 2216  AST 47*  ALT 17  ALKPHOS 62  BILITOT 1.4*  PROT 7.2  ALBUMIN 4.3   No results for input(s): LIPASE, AMYLASE in the last 168 hours. No results  for input(s): AMMONIA in the last 168 hours. CBC: Recent Labs  Lab 06/10/19 2216 06/10/19 2247 06/13/19 0348  WBC 6.0  --  5.3  NEUTROABS 4.4  --  3.9  HGB 8.9* 9.5* 8.0*  HCT 26.9* 28.0* 23.8*  MCV 91.2  --  89.5  PLT 172  --  146*   Cardiac Enzymes: No results for input(s): CKTOTAL, CKMB, CKMBINDEX, TROPONINI in the last 168 hours. BNP: Invalid input(s): POCBNP CBG: Recent Labs  Lab 06/12/19 0414 06/12/19 0632 06/12/19 1211 06/12/19 1723 06/13/19 0054  GLUCAP 109* 95 99 106* 117*   D-Dimer No results for input(s): DDIMER in the last 72 hours. Hgb A1c Recent Labs    06/12/19 0610  HGBA1C 4.6*   Lipid Profile Recent Labs    06/13/19 0348  CHOL 161  HDL 33*  LDLCALC 114*  TRIG 68  CHOLHDL 4.9   Thyroid function studies No results for input(s): TSH, T4TOTAL, T3FREE, THYROIDAB in the last 72 hours.  Invalid input(s): FREET3 Anemia work up No results for input(s): VITAMINB12, FOLATE, FERRITIN, TIBC, IRON, RETICCTPCT in the last 72 hours. Urinalysis    Component Value Date/Time   COLORURINE YELLOW 06/11/2019 0901   APPEARANCEUR CLEAR 06/11/2019  0901   LABSPEC 1.011 06/11/2019 0901   PHURINE 5.0 06/11/2019 0901   GLUCOSEU NEGATIVE 06/11/2019 0901   HGBUR MODERATE (A) 06/11/2019 0901   BILIRUBINUR NEGATIVE 06/11/2019 0901   KETONESUR NEGATIVE 06/11/2019 0901   PROTEINUR 100 (A) 06/11/2019 0901   NITRITE NEGATIVE 06/11/2019 0901   LEUKOCYTESUR NEGATIVE 06/11/2019 0901   Sepsis Labs Invalid input(s): PROCALCITONIN,  WBC,  LACTICIDVEN Microbiology Recent Results (from the past 240 hour(s))  Urine Culture     Status: Abnormal   Collection Time: 06/11/19 12:01 PM   Specimen: Urine, Clean Catch  Result Value Ref Range Status   Specimen Description URINE, CLEAN CATCH  Final   Special Requests   Final    NONE Performed at [facility], [place] [address], [place], [place] [address]    Culture MULTIPLE SPECIES PRESENT, SUGGEST RECOLLECTION (A)  Final   Report Status 06/12/2019 FINAL  Final  SARS CORONAVIRUS 2 (TAT 6-24 HRS) Nasopharyngeal Nasopharyngeal Swab     Status: None   Collection Time: 06/11/19 12:55 PM   Specimen: Nasopharyngeal Swab  Result Value Ref Range Status   SARS Coronavirus 2 NEGATIVE NEGATIVE Final    Comment: (NOTE) SARS-CoV-2 target nucleic acids are NOT DETECTED. The SARS-CoV-2 RNA is generally detectable in upper and lower respiratory specimens during the acute phase of infection. Negative results do not preclude SARS-CoV-2 infection, do not rule out co-infections with other pathogens, and should not be used as the sole basis for treatment or other patient management decisions. Negative results must be combined with clinical observations, patient history, and epidemiological information. The expected result is Negative. Fact Sheet for Patients: SugarRoll.be Fact Sheet for Healthcare Providers: https://www.woods-mathews.com/ This test is not yet approved or cleared by the [place] FDA and  has been authorized for detection and/or diagnosis of SARS-CoV-2  by FDA under an Emergency Use Authorization (EUA). This EUA will remain  in effect (meaning this test can be used) for the duration of the COVID-19 declaration under Section 56 4(b)(1) of the Act, 21 U.S.C. section 360bbb-3(b)(1), unless the authorization is terminated or revoked sooner. Performed at [facility], [place] [address], [place], [place] [address]      Time coordinating discharge: Over 30 minutes  SIGNED:   Einar Grad  Tilly Pernice, MD  [facility] 06/13/2019, 9:31 AM  If 7PM-7AM, please contact night-coverage [email or web address] Password [email or web address]

## 2019-06-13 NOTE — Discharge Instructions (Signed)
Ischemic Stroke  An ischemic stroke is the sudden death of brain tissue. Blood carries oxygen to all areas of the body. This type of stroke happens when your blood does not flow to your brain like normal. Your brain cannot get the oxygen it needs. This is an emergency. It must be treated right away. Symptoms of a stroke usually happen all of a sudden. You may notice them when you wake up. They can include:  Weakness or loss of feeling in your face, arm, or leg. This often happens on one side of the body.  Trouble walking.  Trouble moving your arms or legs.  Loss of balance or coordination.  Feeling confused.  Trouble talking or understanding what people are saying.  Slurred speech.  Trouble seeing.  Seeing two of one object (double vision).  Feeling dizzy.  Feeling sick to your stomach (nauseous) and throwing up (vomiting).  A very bad headache for no reason. Get help as soon as any of these problems start. This is important. Some treatments work better if they are given right away. These include:  Aspirin.  Medicines to control blood pressure.  A shot (injection) of medicine to break up the blood clot.  Treatments given in the blood vessel (artery) to take out the clot or break it up. Other treatments may include:  Oxygen.  Fluids given through an IV tube.  Medicines to thin out your blood.  Procedures to help your blood flow better. What increases the risk? Certain things may make you more likely to have a stroke. Some of these are things that you can change, such as:  Being very overweight (obesity).  Smoking.  Taking birth control pills.  Not being active.  Drinking too much alcohol.  Using drugs. Other risk factors include:  High blood pressure.  High cholesterol.  Diabetes.  Heart disease.  Being [place] American, Native American, Hispanic, or [place] Native.  Being over age 70.  Family history of stroke.  Having had blood clots,  stroke, or warning stroke (transient ischemic attack, TIA) in the past.  Sickle cell disease.  Being a woman with a history of high blood pressure in pregnancy (preeclampsia).  Migraine headache.  Sleep apnea.  Having an irregular heartbeat (atrial fibrillation).  Long-term (chronic) diseases that cause soreness and swelling (inflammation).  Disorders that affect how your blood clots. Follow these instructions at home: Medicines  Take over-the-counter and prescription medicines only as told by your doctor.  If you were told to take aspirin or another medicine to thin your blood, take it exactly as told by your doctor. ? Taking too much of the medicine can cause bleeding. ? If you do not take enough, it may not work as well.  Know the side effects of your medicines. If you are taking a blood thinner, make sure you: ? Hold pressure over any cuts for longer than usual. ? Tell your dentist and other doctors that you take this medicine. ? Avoid activities that may cause damage or injury to your body. Eating and drinking  Follow instructions from your doctor about what you cannot eat or drink.  Eat healthy foods.  If you have trouble with swallowing, do these things to avoid choking: ? Take small bites when eating. ? Eat foods that are soft or pureed. Safety  Follow instructions from your health care team about physical activity.  Use a walker or cane as told by your doctor.  Keep your home safe so you do not fall.  This may include: ? Having experts look at your home to make sure it is safe. ? Putting grab bars in the bedroom and bathroom. ? Using raised toilets. ? Putting a seat in the shower. General instructions  Do not use any tobacco products. ? Examples of these are cigarettes, chewing tobacco, and e-cigarettes. ? If you need help quitting, ask your doctor.  Limit how much alcohol you drink. This means no more than 1 drink a day for nonpregnant women and 2 drinks  a day for men. One drink equals 12 oz of beer, 5 oz of wine, or 1 oz of hard liquor.  If you need help to stop using drugs or alcohol, ask your doctor to refer you to a program or specialist.  Stay active. Exercise as told by your doctor.  Keep all follow-up visits as told by your doctor. This is important. Get help right away if:   You have any signs of a stroke. "BE FAST" is an easy way to remember the main warning signs: ? B - Balance. Signs are dizziness, sudden trouble walking, or loss of balance. ? E - Eyes. Signs are trouble seeing or a change in how you see. ? F - Face. Signs are sudden weakness or loss of feeling of the face, or the face or eyelid drooping on one side. ? A - Arms. Signs are weakness or loss of feeling in an arm. This happens suddenly and usually on one side of the body. ? S - Speech. Signs are sudden trouble speaking, slurred speech, or trouble understanding what people say. ? T - Time. Time to call emergency services. Write down what time symptoms started.  You have other signs of a stroke, such as: ? A sudden, very bad headache with no known cause. ? Feeling sick to your stomach (nausea). ? Throwing up (vomiting). ? Jerky movements you cannot control (seizure). These symptoms may be an emergency. Do not wait to see if the symptoms will go away. Get medical help right away. Call your local emergency services (911 in the [place] Do not drive yourself to the hospital. Summary  An ischemic stroke is the sudden death of brain tissue.  Symptoms of a stroke usually happen all of a sudden. You may notice them when you wake up.  Get help if you have any warning signs of a stroke. This is important. Some treatments work better if they are given right away. This information is not intended to replace advice given to you by your health care provider. Make sure you discuss any questions you have with your health care provider. Document Released: 07/21/2011 Document  Revised: 01/10/2018 Document Reviewed: 10/28/2015 Elsevier Patient Education  [place].

## 2019-06-13 NOTE — Progress Notes (Signed)
STROKE TEAM PROGRESS NOTE   INTERVAL HISTORY His son and HCPOA is at the bedside.  Plans are for d/c today. New pontine infarct.  Patient presented with dysarthria and gait ataxia which is improved somewhat but is not back to his baseline.  I personally reviewed history of presenting illness, electronic medical records and imaging films in PACS  Vitals:   06/13/19 0000 06/13/19 0400 06/13/19 0404 06/13/19 0808  BP: (!) 145/72 (!) 134/56  (!) 146/59  Pulse: (!) 58 60  68  Resp: 20 (!) 24 16 18   Temp: 97.7 F (36.5 C) 97.7 F (36.5 C)  98 F (36.7 C)  TempSrc: Oral Oral  Oral  SpO2: 100% 100%  97%    CBC:  Recent Labs  Lab 06/10/19 2216 06/10/19 2247 06/13/19 0348  WBC 6.0  --  5.3  NEUTROABS 4.4  --  3.9  HGB 8.9* 9.5* 8.0*  HCT 26.9* 28.0* 23.8*  MCV 91.2  --  89.5  PLT 172  --  146*    Basic Metabolic Panel:  Recent Labs  Lab 06/10/19 2216 06/10/19 2247 06/13/19 0348  NA 140 139 140  K 4.0 4.2 3.9  CL 107 106 112*  CO2 23  --  19*  GLUCOSE 122* 117* 128*  BUN 16 19 22   CREATININE 1.98* 1.90* 1.95*  CALCIUM 9.3  --  8.7*   Lipid Panel:     Component Value Date/Time   CHOL 161 06/13/2019 0348   TRIG 68 06/13/2019 0348   HDL 33 (L) 06/13/2019 0348   CHOLHDL 4.9 06/13/2019 0348   VLDL 14 06/13/2019 0348   LDLCALC 114 (H) 06/13/2019 0348   HgbA1c:  Lab Results  Component Value Date   HGBA1C 4.6 (L) 06/12/2019   Urine Drug Screen: No results found for: LABOPIA, COCAINSCRNUR, LABBENZ, AMPHETMU, THCU, LABBARB  Alcohol Level No results found for: [facility]  IMAGING Mr Angio Head Wo Contrast  Result Date: 06/12/2019 CLINICAL DATA:  Focal neuro deficit, greater than 6 hours, stroke suspected. Additional history provided: Abnormal MRI, stroke, slurred speech EXAM: MRA HEAD WITHOUT CONTRAST TECHNIQUE: Angiographic images of the Circle of Willis were obtained using MRA technique without intravenous contrast. COMPARISON:  Brain MRI 06/11/2019, head CT 06/10/2019,  MRI/MRA head 11/19/2016 FINDINGS: The intracranial internal carotid arteries are patent. Atherosclerotic irregularity of the cavernous/paraclinoid internal carotid arteries, predominantly on the right without significant stenosis. The right middle cerebral artery is patent without significant proximal stenosis. Apparent moderate focal stenosis versus artifact from vessel tortuosity at the origin of the right anterior cerebral artery. The left middle and anterior cerebral arteries are patent without significant proximal stenosis. 2 mm inferiorly projecting aneurysm versus infundibulum arising from the supraclinoid right internal carotid artery (series 3, image 72) (series 304, image 17). The intracranial vertebral arteries are patent without significant stenosis, as is the basilar artery. The bilateral posterior cerebral arteries are patent without significant proximal stenosis. Posterior communicating arteries are poorly delineated and may be hypoplastic or absent bilaterally. IMPRESSION: 1. No intracranial large vessel occlusion. 2. Moderate focal stenosis at the origin of the right anterior cerebral artery versus artifact from vessel tortuosity. Otherwise, no significant proximal stenosis. Mild atherosclerotic disease within the intracranial internal carotid arteries. 3. 2 mm in inferiorly projecting aneurysm versus infundibulum arising from the supraclinoid right ICA. Electronically Signed   By: Kellie Simmering DO   On: 06/12/2019 16:43   Mr Brain Wo Contrast  Result Date: 06/11/2019 CLINICAL DATA:  TIA.  Slurred speech.  Altered mental status. EXAM: MRI HEAD WITHOUT CONTRAST TECHNIQUE: Multiplanar, multiecho pulse sequences of the brain and surrounding structures were obtained without intravenous contrast. COMPARISON:  Head CT yesterday. FINDINGS: Brain: Diffusion imaging shows acute infarction affecting the right para median pons. No other acute finding. Few old small vessel infarctions affecting the pons.  Cerebral hemispheres show age related atrophy with mild to moderate chronic small-vessel ischemic change of the deep white matter. No cortical infarction. No mass, hemorrhage, hydrocephalus or extra-axial collection. Vascular: Major vessels at the base of the brain show flow. Skull and upper cervical spine: Negative Sinuses/Orbits: Clear/normal Other: None IMPRESSION: Acute infarction affecting the right para median pons. No swelling or hemorrhage. Mild to moderate chronic small-vessel ischemic changes elsewhere affecting the pons and the cerebral hemispheric white matter. Electronically Signed   By: Nelson Chimes M.D.   On: 06/11/2019 15:55    PHYSICAL EXAM Obese elderly male not in distress. . Afebrile. Head is nontraumatic. Neck is supple without bruit.    Cardiac exam no murmur or gallop. Lungs are clear to auscultation. Distal pulses are well felt. Neurological Exam :  Awake alert oriented x 3 normal speech and language. Mild left lower face asymmetry. Tongue midline. No drift. Mild diminished fine finger movements on left. Orbits right over left upper extremity. Mild left grip weak.. Normal sensation . Normal coordination.  Gait deferred ASSESSMENT/PLAN James Curtis is a 78 y.o. male with history of TIA, stroke, status post resection of left atrial mass, status post aortic valve replacement with bio prosthetic valve, perivalvular leak with prosthetic valve, mitral valve mass, left atrial mass, hypertension, hypercholesterolemia, dementia, complete heart block, carotid artery disease, cardiomyopathy, and aortic stenosis presenting with recurrent falls over the past 2 to 3 days prior to hospitalization. Found to have L facial droop, dysarthria. Admitted to rule out stroke.   Stroke:  R paramedian pontine infarct secondary to small vessel disease source  CT head chronic small vessel disease.   MRI  R paramedian pontine infarct. Small vessel disease.   MRA  No LVO. Moderate stenosis origin R  ACA vs artifact. Mild intracranial ICA atherosclerosis. R ICA supraclinoid aneurysm vs infundibulum  2D Echo EF 55-60%. No source of embolus   LDL 114  HgbA1c 4.6  aspirin 81 mg daily prior to admission, now on aspirin 81 mg daily. Continue DAPT x 3 weeks then aspirin alone.   Therapy recommendations:  HH PT, OT, SLP  Disposition:  Return home w/ son  Atrial Fibrillation  Home anticoagulation:  none   Pt previously declined long-term AC   Hypertension  Stable . Permissive hypertension (OK if < 220/120) but gradually normalize in 5-7 days . Long-term BP goal normotensive  Hyperlipidemia  Home meds:  Omega 3  For d/c on lipitor 40  LDL 114, goal < 70  Continue statin at discharge  Diabetes, type II, controlled  HgbA1c 4.6, goal < 7.0   Other Stroke Risk Factors  Advanced age  Former Cigarette smoker  Obesity, There is no height or weight on file to calculate BMI., recommend weight loss, diet and exercise as appropriate. Son plans to start a meat free, whole food diet at d/c.  Hx stroke/TIA  11/2016 - TIA in setting of CHB  Possible Obstructive sleep apnea, evaluate as an OP  Aortic stenosis s/p replacement  Resection LA mass  Hx MV mass  Cardiomyopathy   Other Active Problems  Baseline dementia  Hospital day # 1  I have personally obtained history,examined  this patient, reviewed notes, independently viewed imaging studies, participated in medical decision making and plan of care.ROS completed by me personally and pertinent positives fully documented  I have made any additions or clarifications directly to the above note.  James Curtis presented with dysarthria and gait ataxia secondary to right pontine infarct from small vessel disease.  Recommend aspirin and Plavix for 3 weeks followed by aspirin alone and aggressive risk factor modification.  Patient may have possible obstructive sleep apnea but this can be evaluated as an outpatient.  James Curtis will return for  follow-up in the stroke clinic in 6 weeks or call earlier if necessary.  Discussed with patient, son and Dr. Einar Grad.  Greater than 50% time during this 25-minute visit was spent on counseling and coordination of care about his lacunar stroke and answering questions  Antony Contras, MD Medical Director [place] Pager: [phone] 06/13/2019 5:00 PM   To contact Stroke Continuity provider, please refer to [email or web address] After hours, contact General Neurology

## 2019-06-13 NOTE — Progress Notes (Signed)
  Speech Language Pathology Treatment: Cognitive-Linquistic  Patient Details Name: James Curtis MRN: [id number] DOB: June 13, 1941 Today's Date: 06/13/2019 Time: 1020-1036 SLP Time Calculation (min) (ACUTE ONLY): 16 min  Assessment / Plan / Recommendation Clinical Impression  Pt was encountered awake/alert in chair and he was agreeable to ST treatment session.  RN reported that pt will be discharging home later today with 24/7 assistance from son.  Pt exhibited improved, but still mildly dysarthric speech at the word, sentence, and conversation level.  Pt was educated regarding speech intelligibility strategies and he verbalized and demonstrated understanding during sentence repetition tasks given min-mod verbal cues.  Pt was able to repeat short phrases and sentences with 100% accuracy; however, he was unable to repeat sentences that were longer than 6 words.  Pt continues to present with anomia in conversational speech.  He answered basic and complex yes/no questions and completed responsive naming tasks with 100% accuracy independently given extra time.  Pt additionally was oriented to self, year, date, day, and time given the use of a clock and calendar.  Pt answered safety judgement questions with 75% accuracy independently, improving to 100% accuracy given minimal verbal cues.  He demonstrated the most difficulty with problem solving questions relating to money management, answering basic questions with 66% accuracy independently given extra time.  Recommend home health ST and 24/7 supervision and assistance with IADLs (medications, finances, etc.) at time of discharge.  Pt was educated regarding all recommendations and he verbalized understanding.     HPI HPI: James Curtis is a 78 y.o. male with medical history significant of complete heart block, s/p bioprosthetic AoR, PAF who has previously declined long term anti-coagulation. Pt with 2 falls in last week and call his son to help him and noticed  that his speech pattern was changed: slurred words, slower speech and cognitive change. 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      SLP Plan  Continue with current plan of care       Recommendations                   Plan: Continue with current plan of care       Bretta Bang, M.S., [place] Office: [phone]               [place] 06/13/2019, 10:38 AM

## 2019-06-18 ENCOUNTER — Other Ambulatory Visit: Payer: Self-pay | Admitting: *Deleted

## 2019-06-18 NOTE — Patient Outreach (Signed)
[facility]) Care Management  06/18/2019  James Curtis Jan 07, 1941 [id number]   Subjective: Telephone call to patient's home / mobile number, spoke with patient's son / designated party release James Curtis), states patient's name, date of birth, and address.  Discussed [facility] Care Management Medicare EMMI Stroke Red Flag Alert follow up, son voiced understanding, and is in agreement to follow up on patient's behalf.  Son states patient is doing better overall and getting some rest. States patient has not been very active since hospitalization, sleeping a lot, going to bed early, getting up in the early afternoon, and asked RNCM if this was normal.   Discussed signs/ symptoms to report, how to reach provider if needed after hours, when to go to ED, and / or call 82, son voices understanding, and states he will follow up with MD regarding his concerns with patient's sleeping pattern.  RNCM advised son to follow up with MD regarding his concerns as soon as possible.   Son states he is remember receiving EMMI automated calls on patient's behalf, was annoyed with the call, hung up the phone, system called at a bad time, and system did not capture correct answer.  Son states home care agency has attempted to make start of care visit, son asked agency to postpone because patient was needing rest, and agency has postponed to to staffing.  States the will follow up with home health agency today to verify start of care and services ordered.  States  patient has a follow up appointment with primary MD on 06/25/2019 , his other MD's offices are to call with follow up appointment dates and times.  RNCM verbally reviewed discharge instruction to do list with son,  son given contact numbers for the following providers ( Cardiologist [phone], [facility] [phone], Neurologist [phone]).  Son states he will follow up with providers to verify appointments scheduled if he has not  received confirmation by end of business on 06/19/2019.  Son states patient  is not able to manage self care and has assistance as needed.  Son states he has recently moved in with patient to provide patient with 24 hour care, his family is assisting, son's life has been altered, needing caregiver resources, is in agreement to referral to [place] Management Social Worker for caregiver resources.  Discussed [facility] Care Management services, son voiced  understanding,  Son states patient  does not have any education material, EMMI follow up, care coordination, care management, disease monitoring, transportation, or pharmacy needs at this time.   States he is very appreciative of the follow up, is in agreement to receive [place] Management information, EMMI follow up calls as needed and  [facility] [place] [facility] Management services.      Objective:  Per KPN (Knowledge Performance Now, point of care tool) and chart review, patient hospitalized 06/10/2019 - 06/13/2019 for Acute ischemic stroke, status post fall.    Patient has a history of hypertension, diabetes, dementia, status post  AVR (aortic valve replacement),  Paroxysmal atrial fibrillation, Aortic stenosis, Cardiomyopathy, Carotid arterial disease , Chronic kidney disease Stage 4, Complete heart block , Heart murmur, Hypercholesteremia, Left atrial mass, Paravalvular leak (prosthetic valve), TIA (transient ischemic attack), and stroke.      Assessment: Received Medicare EMMI Stroke Red Flag Alert follow up referral on 06/17/2019.   Red Flag Alert Trigger, Day #1, patient answered no to the following question: Scheduled a follow-up appointment? EMMI follow up completed and will refer patient to [place]  Management Education officer, museum for caregiver resources.     Plan: RNCM will send patient successful outreach letter, [facility] pamphlet, and magnet, per son's request. RNCM will refer patient to [place] Management Social Worker for caregiver resources.    Rahmir Beever H. Annia Friendly, BSN, [place] Management [facility] Telephonic CM Phone: [phone] Fax: [phone]

## 2019-06-20 ENCOUNTER — Other Ambulatory Visit: Payer: Self-pay

## 2019-06-20 ENCOUNTER — Other Ambulatory Visit: Payer: Self-pay | Admitting: *Deleted

## 2019-06-20 NOTE — Patient Outreach (Signed)
[facility]) Care Management  06/20/2019  Mckade Gurka 07/02/1941 [id number]   Subjective:  Received voicemail message times 2 from patient's son / power of attorney  Ren Aspinall), states he has ran into some problems with [facility] home care agency regarding services that were ordered, signing of paperwork, and no longer has a relationship with this company, requested call back.   Telephone call to patient's home / mobile number, spoke with patient's son/ power of attorney/ designated party release Sharen Hones), stated patient's name, date of birth, and address.  Son states he remembers speaking with this RNCM in the past and needing assistance with obtaining new home health agency.  States he has also has spoken with [place] today regarding home health issues and need for caregiver resources.  Son states services with [facility] did not work out due to the following reason:  Home health agency denied orders received for ( occupational therapy, nursing, speech therapy), physical therapist that did start of care was very unprofessional, there was an issue with signing of paperwork, son's questions/ concerns regarding paperwork were not answered per his request, son spoke with Teacher, early years/pre at [place] regarding his concerns, and agreed upon outcome was son does not wish to receive any further services from [place].   Son states he has contacted patient's past home health agency [facility], [phone]) he spoke with Deana (office manager), he advised Deana that he would like to obtain services (nursing, physical therapy, occupational therapy, nursing)  for patient, as soon as possible, and he verified the employment status of patient's past home care providers with this agency.  States he was instructed by [facility] to have patient's primary MD to send orders to their agency regarding requested services and they would  follow for start of care with patient / patient's son.   States he was also advised by [facility] that there may be a  2 week delay in physical therapy start of care due to staffing, voiced understanding, and is in agreement.  States patient has a follow up appointment with cardiologist on 06/21/2019 and he was also see if cardiologist will write home health orders.   RNCM advised will follow up with primary MD's office to see if they will send orders to new agency per son's request prior to patient's 06/25/2019 MD/ hospital follow up visit.   RNCM advised with also follow up with [facility] to see if they well send orders to new agency as a customer service and this may assist with expediting start of care with new agency.  Son voices understanding, is in agreement, and states he is appreciative of follow up.   Telephone call to [facility] at Dr. [name] office, transferred to assistant's voicemail, left detailed voicemail for Shaquille Murdy (Dr. Lina Sar assistant), advised patient in need of new home health orders to be sent to [facility], advised RNCM will be out of the office on 06/21/2019, and requested call back on 06/24/2019.   Telephone call to [place] at [facility], states she remembers speaking with patient's son, and verified that they can provide services for patient once orders received, and insurance verified.  States they also running approximately 2 weeks behind in providing occupational therapy services.   States the referral can be faxed to [phone] or called to intake number at [phone] by the MD's office.  States once required information has been received they will reach out to patient's  son to schedule start of care. Telephone call to [place] at [facility], states she is with the answering services, and the office is closed for the day, no message left.  Telephone call to patient's home / mobile number, spoke with patient's son/ power of attorney/ designated party release  Sharen Hones), stated patient's name, date of birth, and address.  RNCM advised son, of above conversations, and updates.   States he is in agreement to continue to pursue patient's home health services with [facility], even if some of the services will have a delayed start date due to staffing, and he will also follow up with cardiologist office to see if they will order the services.  Son advised to refer cardiologist to 06/13/2019 [facility] / Epic transition of care note from [place] physical therapist Robb Matar) with [facility], stated patient did great with initial physical therapist visit, and son does not feel delay in service will impact patient's recovery.  Son states patient is moving around, doing great overall, strength is improving, but memory still has not improved, since hospital discharge. Son states he is aware of signs/ symptoms to report, how to reach provider if needed after hours, when to have patient to go to ED, and / or call 911.   Son states he continues to provide patient with medication administration and 24 hour care as needed.  RNCM advised will be out of the office on 06/21/2019 and will follow up on 06/24/2019, son voiced understanding, is in agreement, and appreciative of follow up.   Objective:  Per KPN (Knowledge Performance Now, point of care tool) and chart review, patient hospitalized 06/10/2019 - 06/13/2019 for Acute ischemic stroke, status post fall.    Patient has a history of hypertension, diabetes, dementia, status post  AVR (aortic valve replacement),  Paroxysmal atrial fibrillation, Aortic stenosis, Cardiomyopathy, Carotid arterial disease , Chronic kidney disease Stage 4, Complete heart block , Heart murmur, Hypercholesteremia, Left atrial mass, Paravalvular leak (prosthetic valve), TIA (transient ischemic attack), and stroke.      Assessment: Received Medicare EMMI Stroke Red Flag Alert follow up referral on 06/17/2019.   Red Flag Alert Trigger, Day  #1, patient answered no to the following question: Scheduled a follow-up appointment? EMMI follow up completed and referred patient to [place] Management Social Worker for caregiver resources.   RNCM will follow up with patient's son regarding home health services agency change per son's request.      Plan: RNCM will follow up with patient's son, primary MD, and home health agency regarding status of home health agency provider change within 4 business days.    Makinsley Schiavi H. Annia Friendly, BSN, [place] Management [facility] Telephonic CM Phone: [phone] Fax: [phone]

## 2019-06-20 NOTE — Patient Outreach (Addendum)
[facility]) Care Management  06/20/2019  Deland Slocumb 04-Apr-1941 [id number]   Social Work referral received from [facility], Sonda Rumble for caregiver resources. "Son states he has recently moved in with patient to provide patient with 24 hour care, his family is assisting, son's life has been altered, and is needing caregiver resources. " Unsuccessful outreach to son today.  Left voicemail message and mailed Unsuccessful Outreach Letter.  Will attempt to reach again within four business days.   Addendum: Received return call from patient's son.  Discussed need for in-home aide services and informed him that Medicare only covers cost of service if in conjunction with skilled service.  Son inquired about whether or not service is covered by patient's "GHI" plan.  Encouraged him to contact customer service to inquire. Informed son about respite program through [employer] [facility] and provided him with contact information for [employer] to further inquire about patient's eligibility.   Also informed patient about [facility]ing through [employer] and encouraged him to schedule session when he calls about respite program. Son reports that patient is not receiving [place] services with [facility]; he was displeased with PT staff.  Informed him that patent's PCP should be able to order services through another agency.  Offered to call provider to assist with this but son stated that he would call.   Will follow up with son next week.    Ronn Melena, BSW Social Worker [phone]

## 2019-06-21 ENCOUNTER — Ambulatory Visit: Payer: Medicare Other

## 2019-06-21 ENCOUNTER — Ambulatory Visit (INDEPENDENT_AMBULATORY_CARE_PROVIDER_SITE_OTHER): Payer: Medicare Other | Admitting: Physician Assistant

## 2019-06-21 ENCOUNTER — Other Ambulatory Visit: Payer: Self-pay

## 2019-06-21 ENCOUNTER — Encounter: Payer: Self-pay | Admitting: Physician Assistant

## 2019-06-21 VITALS — BP 128/68 | HR 60 | Ht 67.5 in | Wt 197.0 lb

## 2019-06-21 DIAGNOSIS — I480 Paroxysmal atrial fibrillation: Secondary | ICD-10-CM

## 2019-06-21 DIAGNOSIS — I2510 Atherosclerotic heart disease of native coronary artery without angina pectoris: Secondary | ICD-10-CM

## 2019-06-21 DIAGNOSIS — Z8673 Personal history of transient ischemic attack (TIA), and cerebral infarction without residual deficits: Secondary | ICD-10-CM

## 2019-06-21 DIAGNOSIS — Z952 Presence of prosthetic heart valve: Secondary | ICD-10-CM | POA: Diagnosis not present

## 2019-06-21 DIAGNOSIS — I639 Cerebral infarction, unspecified: Secondary | ICD-10-CM | POA: Diagnosis not present

## 2019-06-21 DIAGNOSIS — N1832 Chronic kidney disease, stage 3b: Secondary | ICD-10-CM

## 2019-06-21 DIAGNOSIS — I5020 Unspecified systolic (congestive) heart failure: Secondary | ICD-10-CM

## 2019-06-21 DIAGNOSIS — T8203XD Leakage of heart valve prosthesis, subsequent encounter: Secondary | ICD-10-CM | POA: Diagnosis not present

## 2019-06-21 DIAGNOSIS — I10 Essential (primary) hypertension: Secondary | ICD-10-CM | POA: Diagnosis not present

## 2019-06-21 NOTE — Patient Instructions (Signed)
Medication Instructions:   Your physician recommends that you continue on your current medications as directed. Please refer to the Current Medication list given to you today.  *If you need a refill on your cardiac medications before your next appointment, please call your pharmacy*  Lab Work:  Your physician recommends that you return for lab work on 08/02/19 at 12PM  If you have labs (blood work) drawn today and your tests are completely normal, you will receive your results only by: [facility] Kitchen MyChart Message (if you have MyChart) OR . A paper copy in the mail If you have any lab test that is abnormal or we need to change your treatment, we will call you to review the results.  Testing/Procedures:  None ordered today  Follow-Up:  On 08/02/19 at 12PM for lab work. I will have Dr. Antionette Char nurse call you to set up 3 month follow up.

## 2019-06-21 NOTE — Progress Notes (Signed)
Cardiology Office Note:    Date:  06/21/2019   ID:  James Curtis, DOB Nov 09, 1940, MRN [id number]  PCP:  [name], MD  Cardiologist:  Sherren Mocha, MD  Electrophysiologist:  Constance Haw, MD   Referring MD: [name], MD   Chief Complaint  Patient presents with   Hospitalization Follow-up    admx with CVA    History of Present Illness:    James Curtis is a 78 y.o. male with:  - Papillary Fibroelastoma     - S/p L atrial mass resection + bioprosthetic AVR 4/18       - C/b paravalvular leak (hemolytic anemia)       - S/p aortic valvuloplasty 09/2018 [facility], Dr. 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 underwent left atrial mass resection and bioprosthetic AVR in April 2018.  He developed mild to moderate paravalvular leak which caused hemolytic anemia.  He ultimately underwent balloon valvuloplasty with Dr. Albertine Patricia at [facility] in [place] in February 2020.  He was last seen in the office by Leanor Kail, PA-C in fibber 2020 for posthospitalization follow-up.  His pacemaker indicated evidence of paroxysmal atrial fibrillation.  He was seen by Oda Kilts, PA-C for EP in 03/2019.  The patient declined anticoagulation at that time.  He was most recently admitted 10/26-10/29 with a right pontine stroke.  Neurology recommended aspirin plus Plavix for 3 weeks, then Plavix  alone.  He returns for follow up.  He is here with his son.  He fell when he had his stroke and is still having some back pain.  He has not had much shortness of breath.  He has not had chest pain, leg swelling, syncope.  He did have some nausea while in the exam room and ultimately vomited.  It seems like he had a vagal episode.    Prior CV studies:   The following studies were reviewed today:  Echocardiogram 06/12/2019 EF 55-60, moderate LVH, grade 1 diastolic dysfunction, normal RV SF, severe LAE, severe MAC, mild MR, mild TR, mild to moderate AI, paravalvular regurgitation, trivial PI  TEE ([place]) 09/26/2018   Transesophageal echocardiogram 08/22/2018 - Left ventricle: Systolic function was normal. The estimated   ejection fraction was in the range of 55% to 60%. - Aortic valve: Intuity Edwards bioprosthetic valve 23 mm The valve   is not ideally visualized due to shadowing from the stents. 3D   imaging also suboptimal Moderate appearing AR. I reviewed his TTE   as well. Particularly on 3D imaging # 34 the regurgitation seems   to be more from a tear in the base of one of the leaflets and not   PVL. The trans-gastric gradients are also elevated mean 21 mmHg   peak 39 mmHg. The annulus appeared stable with no  obvious gaps.   On SA images there may be two areas of regurgitation one at 9:00   and on 3D images at 12:00 which appears to be more within the   annulus at the base of a leaflet - Mitral valve: There was mild regurgitation. - Left atrium: The LAA not well visualized Op note does not report   it being excluded The atrium was severely dilated. - Right ventricle: Pacing wires seen in RA/RV. - Right atrium: The atrium was dilated. - Atrial septum: No defect or patent foramen ovale was identified.  Chest CT 06/11/18 IMPRESSION: 1. Sub solid left lower lobe nodule is stable to minimally larger than on baseline examination 11/26/2016. Findings remain worrisome for  low-grade adenocarcinoma. 2. Subpleural right upper lobe 6 mm nodule, stable from baseline 11/26/2016 and considered benign. 3. Pulmonary fibrotic changes may be due to nonspecific interstitial pneumonitis. 4.  Emphysema (ICD10-J43.9). 5.  Aortic atherosclerosis (ICD10-170.0). 6. Enlarged pulmonary arteries, indicative of pulmonary arterial hypertension   Echo 04/17/18 Severe conc LVH, EF 55-60, no RWMA, Gr 2 DD, AVR with mild to mod perivalvular regurgitation, mod to severe MAC, severe LAE, mild to mod TR, PASP 42   Echo 01/26/17 EF 40-45, inferior and inferoseptal hypokinesis/akinesis, severe LVH, grade 1 diastolic dysfunction, s/p AVR with mean 16, MAC, severe LAE, mildly reduced RVSF   Intraoperative TEE 12/02/16 Severe concentric LVH, EF 60-65, mild to moderate aortic stenosis, mild MR   R/L heart cath 11/25/16 LAD irregularities LCx mild disease; OM3 40 RCA proximal 50 RA 4, mean PA 19, PCWP mean 9, PVR 1.3 WU Mild to mod AS - Mean AV gradient 22, AVA 2 cm; mean gradient on pullback 16, AVA 2.3 cm   ABIs 11/25/16 ABI&'s of the right lower extremity indicate mild/boarderline moderate peripheral arterial disease with monophasic flow. ABI&'s of the left lower extremity are within normal limits however the dorsal pedis demonstrates dampened monophasic flow.  R 0.8' L 1.06   TEE 11/23/16 Severe concentric LVH, EF 60-65, MAC, 16 x 4 mm mobile echodensity on atrial basal surface of posterior mitral valve, mild MR   Carotid US 11/20/16 Bilateral ICA 1-39   Echo 11/20/16 Mild concentric LVH, vigorous LVF, EF 65-70, normal wall motion, grade 1 diastolic dysfunction, moderate aortic stenosis (mean 26, peak 62), severe MAC, moderate mitral stenosis (mean 5), mild MR, 12 x 7 mm mobile echodensity attached to the posterior mitral valve leaflet, mild LAE, mild TR   Past Medical History:  Diagnosis Date   Aortic stenosis    a. 11/20/2016 Echo: mod AS;  b. 11/23/2016 TEE restricted mobility of  noncoronary cusp;  c. 11/2016 Cath: mean grad 51mHg w/ dual lumen catheter, AVA 2.0cm^2; mean gradient by pullback = 128mg, AVA 2.3cm^2.   Cardiomyopathy (HCUniversal   Echo 6/18: EF 40-45, inf/inf-septal HK-AK, Gr 1 DD, severe LVH, MAC, severe LAE, mildly reduced RVSF   Carotid arterial disease (HCFunk[place]   a. 11/2016 Carotid U/S: bilat 1-39% ICA stenosis.   Chronic kidney disease    Complete heart block ([facility] [place]   a. 11/21/2016 s/p MDT AzSantina EvansR MRI SureScan (ser # [id number]).   Dementia (HCUlmer   mild   Dementia (HCHamps[place]   Family history of adverse reaction to anesthesia    Son [name] a difficult time waking up   Heart murmur    Hemolytic anemia (HCC)    Hypercholesteremia    Hypertension    benign   Incidental pulmonary nodule,  greater than or equal to 74m - needs f/u scan 3 months 11/28/2016   14 x 8 mm (mean diameter of 11 mm) left lower lobe pulmonary nodule with some surrounding ground-glass attenuation (axial image 42 of series 6). 9 x 5 mm (mean diameter of 7 mm) subpleural nodule in the periphery of the right upper lobe (axial image 20 of series 6). 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 11/2009 TEE: EF 60-65%, restricted mobility of noncoronary AoV cusp, 16x4 mm mobile Ca2+ echodensity on the atrial basal surface of the posterior MV leaflet, mild MR, no LA/LAA thrombus.   Non-obstructive CAD (coronary artery disease)    a. 11/2016 Cath: LM large, nl, LAD min irregs, LCX large, min irregs, OM3 40, RCA large, 543m   Paravalvular leak (prosthetic valve)    Echo 9/19: Severe concentric LVH, EF 55-60, normal wall motion, grade 2 diastolic dysfunction, bioprosthetic AVR with mild to moderate perivalvular regurgitation, moderate to severely calcified mitral valve annulus, severe LAE, mild to moderate TR, PASP 42    Paravalvular leak (prosthetic valve), subsequent encounter    Pre-diabetes    Prostate cancer ([facility]   s/p radiation   Renal mass of unknown nature - needs f/u MRI 11/28/2016   Multiple renal lesions are noted in the kidneys bilaterally. Although several of these lesions are low-attenuation, compatible with cysts, other lesions are intermediate to high attenuation, potentially enhancing   S/P aortic valve replacement with bioprosthetic valve 12/02/2016   23 mm Edwards Intuity rapid deployment bovine pericardial tissue valve   S/P resection of left atrial mass 12/02/2016   Benign dystrophic fibrotic tissue with calcification attached to posterior mitral annulus by thin fibrotic stalk   Stroke (HCC)    TIA (transient ischemic attack)    a. 11/2016 in setting of complete heart block.   Surgical Hx: The patient  has a past surgical history that includes PACEMAKER IMPLANT (N/A, 11/21/2016); TEE without cardioversion (N/A, 11/23/2016); RIGHT/LEFT HEART CATH AND CORONARY ANGIOGRAPHY (N/A, 11/25/2016); Excision of atrial myxoma (N/A, 12/02/2016); TEE without cardioversion (N/A, 12/02/2016); Aortic valve replacement (N/A, 12/02/2016); and TEE without cardioversion (N/A, 08/22/2018).   Current Medications: Current Meds  Medication Sig   amLODipine (NORVASC) 10 MG tablet Take 10 mg by mouth daily.   amoxicillin (AMOXIL) 500 MG tablet Take 4 tablets (2 grams) by mouth 30-60 minutes prior to dental visit.   aspirin EC 81 MG tablet Take 1 tablet (81 mg total) by mouth daily for 21 days.   atorvastatin (LIPITOR) 40 MG tablet Take 1 tablet (40 mg total) by mouth daily at 6 PM.   Capsicum, Cayenne, (CAYENNE PEPPER PO) Take 1 capsule by mouth daily. Takes 1 tablet po as directed. This is a complex tablet along with ginko biloba    carvedilol (COREG) 6.25 MG tablet TAKE 1 TABLET BY MOUTH TWICE A DAY   cholecalciferol (VITAMIN D) 1000 units tablet Take 1,000 Units by mouth daily.   clopidogrel (PLAVIX) 75  MG tablet Take 1 tablet (75 mg total) by mouth daily.   folic acid (FOLVITE) 1 MG tablet TAKE 3 TABLETS BY MOUTH DAILY (Patient taking differently: Take 1 mg by mouth See admin instructions. 2 tabs in the Am and 1 tablet in the evening)   Ginkgo Biloba 40 MG TABS Take by mouth. Unsure dose. Complex tablet along  with cayenne pepper. Takes as directed.   losartan (COZAAR) 100 MG tablet Take 1 tablet (100 mg total) by mouth daily.   Omega-3 Fatty Acids (OMEGA 3 PO) Take 1 capsule by mouth daily.    Sage Leaf POWD Take 3 capsules by mouth daily.    Tetrahydrozoline HCl (VISINE OP) Place 1 drop into both eyes daily as needed (dry , itchy eyes).      Allergies:   Patient has no known allergies.   Social History   Tobacco Use   Smoking status: Former Smoker    Packs/day: 0.25    Types: Cigarettes    Quit date: 11/16/2016    Years since quitting: 2.5   Smokeless tobacco: Never Used  Substance Use Topics   Alcohol use: No   Drug use: No     Family Hx: The patient's family history includes Cancer in his father.  ROS:   Please see the history of present illness.    ROS All other systems reviewed and are negative.   EKGs/Labs/Other Test Reviewed:    EKG:  EKG is not ordered today.  The ekg ordered today demonstrates n/a  Recent Labs: 07/19/2018: TSH 1.757 06/10/2019: ALT 17 06/13/2019: BUN 22; Creatinine, Ser 1.95; Hemoglobin 8.0; Platelets 146; Potassium 3.9; Sodium 140   Recent Lipid Panel Lab Results  Component Value Date/Time   CHOL 161 06/13/2019 03:48 AM   TRIG 68 06/13/2019 03:48 AM   HDL 33 (L) 06/13/2019 03:48 AM   CHOLHDL 4.9 06/13/2019 03:48 AM   LDLCALC 114 (H) 06/13/2019 03:48 AM    Physical Exam:    VS:  BP 128/68    Pulse 60    Ht 5' 7.5" (1.715 m)    Wt 197 lb (89.4 kg)    SpO2 98%    BMI 30.40 kg/m     Wt Readings from Last 3 Encounters:  06/21/19 197 lb (89.4 kg)  03/29/19 203 lb (92.1 kg)  10/18/18 205 lb 12.8 oz (93.4 kg)     Physical Exam   Constitutional: He is oriented to person, place, and time. He appears well-developed and well-nourished. No distress.  HENT:  Head: Normocephalic and atraumatic.  Eyes: No scleral icterus.  Neck: No JVD present. No thyromegaly present.  Cardiovascular: Normal rate and regular rhythm.  Murmur heard.  Systolic murmur is present with a grade of 2/6 at the upper right sternal border. Pulmonary/Chest: Effort normal and breath sounds normal. He has no rales.  Abdominal: Soft. There is no hepatomegaly.  Musculoskeletal:        General: No edema.  Lymphadenopathy:    He has no cervical adenopathy.  Neurological: He is alert and oriented to person, place, and time.  Skin: Skin is warm and dry.  Psychiatric: He has a normal mood and affect.    ASSESSMENT & PLAN:    1. Paroxysmal atrial fibrillation (HCC) Noted recently on device interrogation.  The patient has declined anticoagulation in the past.  I did explain to the patient and his son today that his risk of stroke is significant and he would benefit from anticoagulation.  If he changes his mind, he should let us know.  At that point, I suspect we could place him on rivaroxaban or apixaban and take him off of clopidogrel.  2. Heart failure with reduced EF w/ recovered LV function Most recent echocardiogram with EF 55-60.  3. Coronary artery disease Nonobstructive coronary disease by cardiac catheterization April 2018.  He is not having anginal symptoms.  Continue antiplatelet and statin therapy.  4. S/P AVR (aortic valve replacement) 5. Paravalvular leak of prosthetic heart valve  Status post bioprosthetic AVR in April 2018.  He underwent valvuloplasty with Dr. Albertine Patricia at [facility] in [place] due to paravalvular leak.  Most recent echocardiogram with mild to moderate aortic insufficiency.  On exam today, I am not able to detect a diastolic murmur.  I will review with Dr. Roxy Manns to determine if he needs any further follow-up in his office.  I will  also make sure he has follow-up with Dr. Burt Knack in 3 months.  6. Essential hypertension The patient's blood pressure is controlled on his current regimen.  Continue current therapy.   7. Stage 3b chronic kidney disease Recent creatinine stable.  8. History of stroke The patient's son tells me today that they had difficulty with the home health agency and preferred to be switched to a different provider.  He also has not heard from the neurology office to schedule a follow-up.  We will touch base with Dr. Clydene Fake office to make sure the patient has follow-up and also asked that they arrange a different referral for home health services.   Dispo:  Return in about 3 months (around 09/21/2019) for Routine Follow Up, w/ Dr. Burt Knack, (virtual or in-person).   Medication Adjustments/Labs and Tests Ordered: Current medicines are reviewed at length with the patient today.  Concerns regarding medicines are outlined above.  Tests Ordered: Orders Placed This Encounter  Procedures   Lipid Profile   Comp Met (CMET)   Medication Changes: No orders of the defined types were placed in this encounter.   Signed, [name], PA-C  06/21/2019 1:43 PM    [facility] [address], [place], [place]  [address] Phone: [phone]; Fax: [phone]

## 2019-06-24 ENCOUNTER — Telehealth: Payer: Self-pay | Admitting: Physician Assistant

## 2019-06-24 ENCOUNTER — Other Ambulatory Visit: Payer: Self-pay | Admitting: *Deleted

## 2019-06-24 ENCOUNTER — Other Ambulatory Visit: Payer: Self-pay

## 2019-06-24 NOTE — Telephone Encounter (Signed)
Please tell patient I reviewed his case with Dr. Roxy Manns.  The patient needs a follow up Chest CT to continue to monitor his lung nodules. PLAN:  1. Please schedule a Chest CT without contrast Dx: lung nodules Thanks, [name], PA-C    06/24/2019 5:22 PM

## 2019-06-24 NOTE — Progress Notes (Signed)
Compared images to previous (pre-intervention). AI appears mild-moderate at most now, I think less significant than prior. As long as hemolytic anemia is stable I'd be inclined for continued follow-up.

## 2019-06-24 NOTE — Patient Outreach (Signed)
[name]  06/24/2019  James Curtis 1940-12-25 [id number]   Contacted patient's son today for follow up, however, he requested call back on 06/26/19.  Ronn Melena, BSW Social Worker [phone]

## 2019-06-24 NOTE — Patient Outreach (Signed)
[facility]) Care Curtis  06/24/2019  James Curtis 23-Mar-1941 [id number]   Subjective: Telephone call to patient's home / mobile Curtis, spoke with patient's son/ power of attorney/ designated party release James Curtis), stated patient's name, date of birth, and address. Son states he remembers speaking with this RNCM in the past. Son states he is currently dealing with some issues, unable to talk at this time, had not spoken with secondary insurance regarding home health benefits, is planning to follow up in the near future, and requested call back at a later time.   Telephone call to [place] at [place], transferred to [facility] copy), advised of this RNCM's role assisting patient with obtaining new home health agency per patient's son request and to obtain agency in a timely fashion to avoid further delays in care.   James Curtis states they have declined to provide patient with services due to patient's son refusing to sign home health paperwork and staff member treatment by son during home visit.    RNCM advised per patient's son, son declined further home health services from [place] due issue of paperwork signing and home health providers unprofessionalism.  RNCM advised not present during patient's home health start of care visit and unable to mediate the disagreement / miscommunication amongst parties involved in patient's 06/19/2019 start of care home visit.  Discussed how to expedite patient receiving services from new home [place] confirmed that her agency has a order for the following services physical therapy, occupational, and speech therapy.  James Curtis states she does not have a an order for home health nursing.  RNCM given contact Curtis for James Curtis, [phone]) to verify if he received an order for this service from referral source,  that he received prior to patient's hospital discharge.   James Curtis states RNCM can  contact James Curtis to verify status of home health nursing order and call [facility]'s main Curtis, they will fax home health orders to home health agency of patient's son choice.   Telephone call to James Curtis at [facility] Chief Operating Officer), left HIPAA compliant voicemail message, and requested call back.  Telephone call from James Curtis at [place], advised of this RNCM role, and attempting to expedite patient receiving home health services.   States he did receive order for the following services for patient on 06/13/2019 ( nursing, physical therapy, occupational therapy, and speech therapy), he is aware of issues with patient's son, and requested this RNCM to send orders to new home health agency.   RNCM advised James Curtis staff can not send acute MD orders for home health services to agencies, orders must sent by acute hospital CM, or from patient's MD.   James Curtis states he does not understanding the rationale for why RNCM can not send home health orders to new home health agency, RNCM advise will follow up with supervisor James Curtis, and call him back with an update. Telephone call to James Curtis advised of above conversation, discussed case overview, and is in agreement that this RNCM is unable to fax home health orders from acute hospitalization to home health company.   RNCM will make additional follow up call to patient's primary MD to request home health orders be sent to patient's son home health vendor of choice.  Telephone call to James Curtis at [place], advised of my conversation with supervisor and will follow up with primary MD Curtis to obtain new home health orders. New home health  agency contact information not disclosed.  Telephone call to James Curtis, left detailed message for James Curtis (Dr. Lina Sar assistance), advised of patient's son request for new home health agency, advised of home health agency name, referral intake Curtis, fax Curtis, and requested  call back to confirm.   RNCM also advised patient has a follow up appointment with James Curtis on 06/25/2019.   Telephone call from James Curtis at James Curtis, states she has received RNCM's voice mails, voicemail from patient's son,  and home health orders have been sent to agency per patient's son request.      Objective:Per KPN (Knowledge Performance Now, point of care tool) and chart review,patient hospitalized 06/10/2019 - 06/13/2019 forAcute ischemic stroke, status post fall. Patient has a history of hypertension, diabetes, dementia, status post AVR (aortic valve replacement),Paroxysmal atrial fibrillation,Aortic stenosis,Cardiomyopathy,Carotid arterial disease,Chronic kidney diseaseStage 4,Complete heart block,Heart murmur,Hypercholesteremia,Left atrial mass,Paravalvular leak (prosthetic valve),TIA (transient ischemic attack), and stroke.      Assessment: Received Medicare EMMI Stroke Red Flag Alert follow up referral on 06/17/2019. Red Flag Alert Trigger, Day #1, patient answered no to the following question: Scheduled a follow-up appointment? EMMI follow up completed and[facility] Social Worker for caregiver resources.   RNCM will follow up with patient's son regarding home health services agency change per son's request.      Plan:RNCM will follow up with patient's son and new home health agency regarding start of care, within 4 business days, and determine if any additional needs.      [name] H. Annia Friendly, BSN, Raoul Curtis [facility] Telephonic CM Phone: [phone] Fax: [phone]

## 2019-06-25 ENCOUNTER — Other Ambulatory Visit: Payer: Self-pay | Admitting: Thoracic Surgery (Cardiothoracic Vascular Surgery)

## 2019-06-25 DIAGNOSIS — Z952 Presence of prosthetic heart valve: Secondary | ICD-10-CM | POA: Diagnosis not present

## 2019-06-25 DIAGNOSIS — I639 Cerebral infarction, unspecified: Secondary | ICD-10-CM | POA: Diagnosis not present

## 2019-06-25 DIAGNOSIS — I10 Essential (primary) hypertension: Secondary | ICD-10-CM | POA: Diagnosis not present

## 2019-06-25 DIAGNOSIS — F0390 Unspecified dementia without behavioral disturbance: Secondary | ICD-10-CM | POA: Diagnosis not present

## 2019-06-25 DIAGNOSIS — E1122 Type 2 diabetes mellitus with diabetic chronic kidney disease: Secondary | ICD-10-CM | POA: Diagnosis not present

## 2019-06-25 DIAGNOSIS — D649 Anemia, unspecified: Secondary | ICD-10-CM | POA: Diagnosis not present

## 2019-06-25 DIAGNOSIS — R911 Solitary pulmonary nodule: Secondary | ICD-10-CM

## 2019-06-26 ENCOUNTER — Other Ambulatory Visit: Payer: Self-pay

## 2019-06-26 ENCOUNTER — Other Ambulatory Visit: Payer: Self-pay | Admitting: *Deleted

## 2019-06-26 NOTE — Patient Outreach (Signed)
[facility]) Care Management  06/26/2019  James Curtis 24-Jan-1941 [id number]   Follow up call to patient's son today.  Son has received confirmation from patient's insurance providers that aide services are only covered in conjunction with skilled services.  Son was told by a friend that family can be financially compensated for providing care to patient.  Informed him this is only an option if patient has Medicaid.  Discussed Medicaid eligibility again.  Son understands that I cannot say with absolute certainty whether or not patient would qualify but he does feel it is very unlikely due to information provided.  Did encourage him to contact Cresenciano Lick with [employer] about eligibility for respite program. Social work case is being closed at this time but encouraged son to call if additional needs arise.  Ronn Melena, BSW Social Worker [phone]

## 2019-06-26 NOTE — Telephone Encounter (Signed)
S/w pt's son per (DPR) pt's son stopped at Dr.Owen's office yesterday, son scheduled CT scan and f/u appt with Dr.Owen.  Will send to [place] to [place].

## 2019-06-26 NOTE — Patient Outreach (Signed)
[facility]) Care Management  06/26/2019  James Curtis November 15, 1940 [id number]   Subjective: Telephone call to patient's home / mobile number, spoke with patient's son/ power of attorney/ designated party release(Michael Popoca), stated patient's name, date of birth, and address. Son states he remembers speaking with this RNCM in the past.  Son states patient is doing well, had a hospital follow up appointment with primary MD on 06/25/2019 and cardiologist physician assistant on 06/21/2019, both appointments went well.   States he has spoken with patient's secondary insurance and followed up with [place] [facility] Social Worker, received answers to his questions.   RNCM gave son a brief verbal update regarding follow up on home health service request, advised verified services have been ordered, and confirmed types of service ordered by primary MD.  States he has not received a call from new home health agency, aware there may be a 2 week delay for physical therapy and occupational therapy start of care, he will follow up with new agency if he has not received a call by noon on 06/18/2019.   Son states patient does not have any education material, transition of care, care coordination, disease management, disease monitoring, transportation, community resource, or pharmacy needs at this time.  States he is very appreciative of the follow up and is in agreement to receive [facility] EMMI follow up calls if needed in the future.    Objective:Per KPN (Knowledge Performance Now, point of care tool) and chart review,patient hospitalized 06/10/2019 - 06/13/2019 forAcute ischemic stroke, status post fall. Patient has a history of hypertension, diabetes, dementia, status post AVR (aortic valve replacement),Paroxysmal atrial fibrillation,Aortic stenosis,Cardiomyopathy,Carotid arterial disease,Chronic kidney diseaseStage 4,Complete heart block,Heart  murmur,Hypercholesteremia,Left atrial mass,Paravalvular leak (prosthetic valve),TIA (transient ischemic attack), and stroke.      Assessment: Received Medicare EMMI Stroke Red Flag Alert follow up referral on 06/17/2019. Red Flag Alert Trigger, Day #1, patient answered no to the following question: Scheduled a follow-up appointment? EMMI follow up completed andreferredpatientto [facility] Social Worker for caregiver resources.RNCM  follow up with patient's son regarding home health services agency change per son's request completed and no further needs.      Plan:RNCM will complete case closure due to follow up completed / no care management needs.  RNCM will send MD case closure letter.       Cherry Turlington H. Annia Friendly, BSN, [place] [facility] Telephonic CM Phone: [phone] Fax: [phone]

## 2019-07-04 DIAGNOSIS — Z950 Presence of cardiac pacemaker: Secondary | ICD-10-CM | POA: Diagnosis not present

## 2019-07-04 DIAGNOSIS — N1832 Chronic kidney disease, stage 3b: Secondary | ICD-10-CM | POA: Diagnosis not present

## 2019-07-04 DIAGNOSIS — T8203XS Leakage of heart valve prosthesis, sequela: Secondary | ICD-10-CM | POA: Diagnosis not present

## 2019-07-04 DIAGNOSIS — E1122 Type 2 diabetes mellitus with diabetic chronic kidney disease: Secondary | ICD-10-CM | POA: Diagnosis not present

## 2019-07-04 DIAGNOSIS — Z9181 History of falling: Secondary | ICD-10-CM | POA: Diagnosis not present

## 2019-07-04 DIAGNOSIS — D598 Other acquired hemolytic anemias: Secondary | ICD-10-CM | POA: Diagnosis not present

## 2019-07-04 DIAGNOSIS — I69322 Dysarthria following cerebral infarction: Secondary | ICD-10-CM | POA: Diagnosis not present

## 2019-07-04 DIAGNOSIS — Z7902 Long term (current) use of antithrombotics/antiplatelets: Secondary | ICD-10-CM | POA: Diagnosis not present

## 2019-07-04 DIAGNOSIS — I69392 Facial weakness following cerebral infarction: Secondary | ICD-10-CM | POA: Diagnosis not present

## 2019-07-04 DIAGNOSIS — I130 Hypertensive heart and chronic kidney disease with heart failure and stage 1 through stage 4 chronic kidney disease, or unspecified chronic kidney disease: Secondary | ICD-10-CM | POA: Diagnosis not present

## 2019-07-04 DIAGNOSIS — I480 Paroxysmal atrial fibrillation: Secondary | ICD-10-CM | POA: Diagnosis not present

## 2019-07-04 DIAGNOSIS — I2510 Atherosclerotic heart disease of native coronary artery without angina pectoris: Secondary | ICD-10-CM | POA: Diagnosis not present

## 2019-07-04 DIAGNOSIS — I5020 Unspecified systolic (congestive) heart failure: Secondary | ICD-10-CM | POA: Diagnosis not present

## 2019-07-04 DIAGNOSIS — D573 Sickle-cell trait: Secondary | ICD-10-CM | POA: Diagnosis not present

## 2019-07-08 ENCOUNTER — Other Ambulatory Visit: Payer: Self-pay

## 2019-07-08 ENCOUNTER — Encounter: Payer: Self-pay | Admitting: Thoracic Surgery (Cardiothoracic Vascular Surgery)

## 2019-07-08 ENCOUNTER — Ambulatory Visit
Admission: RE | Admit: 2019-07-08 | Discharge: 2019-07-08 | Disposition: A | Discharge status: Home / Self Care | Payer: Medicare Other | Source: Ambulatory Visit | Attending: Thoracic Surgery (Cardiothoracic Vascular Surgery) | Admitting: Thoracic Surgery (Cardiothoracic Vascular Surgery)

## 2019-07-08 ENCOUNTER — Ambulatory Visit (INDEPENDENT_AMBULATORY_CARE_PROVIDER_SITE_OTHER): Payer: Medicare Other | Admitting: Thoracic Surgery (Cardiothoracic Vascular Surgery)

## 2019-07-08 VITALS — BP 109/70 | HR 60 | Temp 97.7°F | Resp 20 | Ht 67.5 in | Wt 189.0 lb

## 2019-07-08 DIAGNOSIS — R911 Solitary pulmonary nodule: Secondary | ICD-10-CM

## 2019-07-08 DIAGNOSIS — I639 Cerebral infarction, unspecified: Secondary | ICD-10-CM

## 2019-07-08 NOTE — Patient Instructions (Addendum)
Continue all previous medications without any changes at this time  Consider daily iron supplements and follow-up with your primary care physician to recheck anemia (blood count)

## 2019-07-08 NOTE — Progress Notes (Signed)
[address]       [place], [address]             [phone]     CARDIOTHORACIC SURGERY OFFICE NOTE  Primary Cardiologist is Sherren Mocha, MD PCP is [name], MD   HPI:  Patient is a 78 year old African-American male with history of aortic stenosis status post aortic valve replacement using a 23 mm Edwards Intuity rapid deployment stented bovine pericardial tissue valve on 73/22/0254 that was complicated by paravalvular leak, long-standing hypertension, hyperlipidemia, tobacco abuse, type 2 diabetes mellitus, and dementia who returns to the office today for follow-upof slowly enlarging left lower lobe lung nodule originally discovered on routine preoperative CT scan performed more than 2 years ago.  Patient was last seen here in our office on August 31, 2018 at which time he was noted to have a fairly large perivalvular leak causing severe hemolysis.  He was referred to Dr. Albertine Patricia at [facility] in [place] who performed balloon aortic valvuloplasty which significantly improved the severity of paravalvular leak.  The patient has apparently remained fairly stable since that time, although he was hospitalized last month with right pontine stroke.  Previous interrogation of his pacemaker has indicated evidence of paroxysmal atrial fibrillation.  Apparently the patient and his son have been resistant to the idea of using warfarin or DOAC, and the patient was recently started on oral Plavix by neurology following his hospitalization for his stroke.  The patient has remained stable from a cardiac standpoint and was seen recently by [name] at [facility].  He returns her office today with his son present for review of his follow-up CT scan.  Patient specifically denies any symptoms of exertional shortness of breath or chest discomfort.  He is not active physically.  Apparently his dementia has slowly gotten worse.   Current Outpatient Medications  Medication Sig  Dispense Refill  . amLODipine (NORVASC) 10 MG tablet Take 10 mg by mouth daily.  11  . amoxicillin (AMOXIL) 500 MG tablet Take 4 tablets (2 grams) by mouth 30-60 minutes prior to dental visit. 12 tablet 1  . atorvastatin (LIPITOR) 40 MG tablet Take 1 tablet (40 mg total) by mouth daily at 6 PM. 30 tablet 0  . Capsicum, Cayenne, (CAYENNE PEPPER PO) Take 1 capsule by mouth daily. Takes 1 tablet po as directed. This is a complex tablet along with ginko biloba     . carvedilol (COREG) 6.25 MG tablet TAKE 1 TABLET BY MOUTH TWICE A DAY 180 tablet 2  . cholecalciferol (VITAMIN D) 1000 units tablet Take 1,000 Units by mouth daily.    . clopidogrel (PLAVIX) 75 MG tablet Take 1 tablet (75 mg total) by mouth daily. 30 tablet 0  . folic acid (FOLVITE) 1 MG tablet TAKE 3 TABLETS BY MOUTH DAILY (Patient taking differently: Take 1 mg by mouth See admin instructions. 2 tabs in the Am and 1 tablet in the evening) 270 tablet 1  . Ginkgo Biloba 40 MG TABS Take by mouth. Unsure dose. Complex tablet along with cayenne pepper. Takes as directed.    Marland Kitchen losartan (COZAAR) 100 MG tablet Take 1 tablet (100 mg total) by mouth daily. 90 tablet 3  . Omega-3 Fatty Acids (OMEGA 3 PO) Take 1 capsule by mouth daily.     Lia Hopping Leaf POWD Take 3 capsules by mouth daily.     . Tetrahydrozoline HCl (VISINE OP) Place 1 drop into both eyes daily as needed (dry ,  itchy eyes).      No current facility-administered medications for this visit.       Physical Exam:   BP 109/70   Pulse 60   Temp 97.7 F (36.5 C) (Skin)   Resp 20   Ht 5' 7.5" (1.715 m)   Wt 189 lb (85.7 kg)   SpO2 96%   BMI 29.16 kg/m   General:  Well-appearing  Chest:   Clear to auscultation  CV:   Regular rate and rhythm with systolic murmur  Incisions:  n/a  Abdomen:  Soft nontender  Extremities:  Warm and well-perfused  Diagnostic Tests:    ECHOCARDIOGRAM REPORT       Patient Name:   James Curtis Date of Exam: 06/12/2019 Medical Rec #:   [id number]      Height:       67.5 in Accession #:    [id number]     Weight:       203.0 lb Date of Birth:  Jan 22, 1941      BSA:          2.05 m Patient Age:    78 years       BP:           156/58 mmHg Patient Gender: M              HR:           60 bpm. Exam Location:  Inpatient  Procedure: 2D Echo  Indications:    Stroke 434.91 / I163.9   History:        Patient has prior history of Echocardiogram examinations, most                 recent 04/17/2018. NICM (nonischemic cardiomyopathy), CAD, S/P AVR                 (aortic valve replacement) and Pacemaker, TIA; Arrythmias:Atrial                 Fibrillation Risk Factors:Hypertension, Diabetes and                 Dyslipidemia. Aortic Valve: A 72mm Edwards Intuity S/P resection                 of left atrial mass.   Sonographer:    Talmage Coin Referring Phys: [place]    1. Left ventricular ejection fraction, by visual estimation, is 55 to 60%. The left ventricle has normal function. Left ventricular septal wall thickness was moderately increased. There is moderately increased left ventricular hypertrophy.  2. Left ventricular diastolic parameters are consistent with Grade I diastolic dysfunction (impaired relaxation).  3. Global right ventricle has normal systolic function.The right ventricular size is normal. No increase in right ventricular wall thickness.  4. Left atrial size was severely dilated.  5. Right atrial size was normal.  6. Severe mitral annular calcification.  7. The mitral valve is normal in structure. Mild mitral valve regurgitation. No evidence of mitral stenosis.  8. The tricuspid valve is normal in structure. Tricuspid valve regurgitation is mild.  9. Aortic valve regurgitation is mild to moderate. 10. The aortic valve is normal in structure. Aortic valve regurgitation is mild to moderate. No evidence of aortic valve sclerosis or stenosis. 11. Perivalvular regurgitation. 12. The  pulmonic valve was normal in structure. Pulmonic valve regurgitation is trivial. 13. Normal pulmonary artery systolic pressure. 14. A pacer wire is visualized. 15. The inferior vena cava is normal  in size with greater than 50% respiratory variability, suggesting right atrial pressure of 3 mmHg.  FINDINGS  Left Ventricle: Left ventricular ejection fraction, by visual estimation, is 55 to 60%. The left ventricle has normal function. There is moderately increased left ventricular hypertrophy. Left ventricular diastolic parameters are consistent with Grade I  diastolic dysfunction (impaired relaxation). Normal left atrial pressure.  Right Ventricle: The right ventricular size is normal. No increase in right ventricular wall thickness. Global RV systolic function is has normal systolic function. The tricuspid regurgitant velocity is 1.75 m/s, and with an assumed right atrial pressure  of 3 mmHg, the estimated right ventricular systolic pressure is normal at 15.2 mmHg.  Left Atrium: Left atrial size was severely dilated.  Right Atrium: Right atrial size was normal in size  Pericardium: There is no evidence of pericardial effusion.  Mitral Valve: The mitral valve is normal in structure. Severe mitral annular calcification. No evidence of mitral valve stenosis by observation. Mild mitral valve regurgitation.  Tricuspid Valve: The tricuspid valve is normal in structure. Tricuspid valve regurgitation is mild.  Aortic Valve: The aortic valve is normal in structure. Aortic valve regurgitation is mild to moderate. The aortic valve is structurally normal, with no evidence of sclerosis or stenosis. Aortic valve mean gradient measures 10.2 mmHg. Aortic valve peak  gradient measures 18.9 mmHg. Aortic valve area, by VTI measures 2.34 cm. 52mm Edwards Intuity valve is present in the aortic position. Perivalvular regurgitation.  Pulmonic Valve: The pulmonic valve was normal in structure. Pulmonic  valve regurgitation is trivial.  Aorta: The aortic root, ascending aorta and aortic arch are all structurally normal, with no evidence of dilitation or obstruction.  Venous: The inferior vena cava is normal in size with greater than 50% respiratory variability, suggesting right atrial pressure of 3 mmHg.  IAS/Shunts: No atrial level shunt detected by color flow Doppler. No ventricular septal defect is seen or detected. There is no evidence of an atrial septal defect.   Additional Comments: A pacer wire is visualized.   LEFT VENTRICLE PLAX 2D LVIDd:         4.70 cm LVIDs:         3.20 cm LV PW:         1.40 cm LV IVS:        1.40 cm LVOT diam:     2.00 cm LV SV:         61 ml LV SV Index:   28.92 LVOT Area:     3.14 cm    RIGHT VENTRICLE RV S prime:     5.00 cm/s TAPSE (M-mode): 0.9 cm  LEFT ATRIUM              Index LA diam:        4.50 cm  2.20 cm/m LA Vol (A2C):   108.0 ml 52.78 ml/m LA Vol (A4C):   87.4 ml  42.71 ml/m LA Biplane Vol: 97.7 ml  47.74 ml/m  AORTIC VALVE AV Area (Vmax):    2.33 cm AV Area (Vmean):   1.92 cm AV Area (VTI):     2.34 cm AV Vmax:           217.50 cm/s AV Vmean:          148.250 cm/s AV VTI:            0.441 m AV Peak Grad:      18.9 mmHg AV Mean Grad:      10.2 mmHg LVOT Vmax:  161.00 cm/s LVOT Vmean:        90.700 cm/s LVOT VTI:          0.328 m LVOT/AV VTI ratio: 0.74   AORTA Ao Root diam: 3.10 cm  MITRAL VALVE                         TRICUSPID VALVE MV Area (PHT): 2.13 cm              TR Peak grad:   12.2 mmHg MV PHT:        103.24 msec           TR Vmax:        175.00 cm/s MV Decel Time: 356 msec MV E velocity: 146.00 cm/s 103 cm/s  SHUNTS MV A velocity: 175.00 cm/s 70.3 cm/s Systemic VTI:  0.33 m MV E/A ratio:  0.83        1.5       Systemic Diam: 2.00 cm    Skeet Latch MD Electronically signed by Skeet Latch MD Signature Date/Time: 06/12/2019/6:43:00 PM    Results for Aboubacar, Matsuo Tyee  (MRN [id number]) as of 07/08/2019 15:46  Ref. Range 10/18/2018 09:56 03/29/2019 12:11 06/10/2019 22:16 06/10/2019 22:47 06/13/2019 03:48  Hemoglobin Latest Ref Range: 13.0 - 17.0 g/dL 9.1 (L) 10.0 (L) 8.9 (L) 9.5 (L) 8.0 (L)  HCT Latest Ref Range: 39.0 - 52.0 % 27.5 (L) 29.5 (L) 26.9 (L) 28.0 (L) 23.8 (L)     CT CHEST WITHOUT CONTRAST  TECHNIQUE: Multidetector CT imaging of the chest was performed following the standard protocol without IV contrast.  COMPARISON:  Chest CT 08/30/2018, as well as older prior examinations.  FINDINGS: Cardiovascular: Heart size is normal. There is no significant pericardial fluid, thickening or pericardial calcification. There is aortic atherosclerosis, as well as atherosclerosis of the great vessels of the mediastinum and the coronary arteries, including calcified atherosclerotic plaque in the left main, left anterior descending, left circumflex and right coronary arteries. Severe calcifications of the mitral annulus. Status post TAVR. Left-sided pacemaker device in place with lead tip terminating in the right atrial appendage and right ventricular apex. Status post median sternotomy.  Mediastinum/Nodes: No pathologically enlarged mediastinal or hilar lymph nodes. Please note that accurate exclusion of hilar adenopathy is limited on noncontrast CT scans. Esophagus is unremarkable in appearance. No axillary lymphadenopathy.  Lungs/Pleura: Again noted is a mixed solid and subsolid lesion in the left lower lobe (axial image 88 of series 8) with a ground-glass attenuation component measuring 1.9 x 1.2 cm, and a central solid component measuring up to 11 mm in length. This is essentially stable compared to the prior chest CT 06/03/2018. No other definite suspicious appearing pulmonary nodules or masses are noted. Patchy areas of ground-glass attenuation, septal thickening and subpleural reticulation are noted in the lungs bilaterally, most evident  throughout the mid to lower lungs. Scattered areas of mild cylindrical bronchiectasis are also noted, most evident in the right lower lobe. No acute consolidative airspace disease. No pleural effusions.  Upper Abdomen: Aortic atherosclerosis.  Musculoskeletal: Median sternotomy wires. There are no aggressive appearing lytic or blastic lesions noted in the visualized portions of the skeleton.  IMPRESSION: 1. Mixed solid and subsolid nodule in the left lower lobe is essentially stable on today's examination, measuring 1.9 x 1.2 cm with central solid component measuring up to 11 mm in length. Given the low-level hypermetabolism on prior PET-CT, this remains concerning for potential adenocarcinoma, however, given the  background of apparent interstitial lung disease (categorized as probable usual interstitial pneumonia (UIP) per current ATS guidelines), this may simply represent a focal area of fibrosis. Continued attention on future follow-up imaging is recommended if surgical resection is not considered at this time. 2. There are calcifications of the mitral annulus. Echocardiographic correlation for evaluation of potential valvular dysfunction may be warranted if clinically indicated. 3. Aortic atherosclerosis, in addition to left main and 3 vessel coronary artery disease. 4. Status post TAVR.   Electronically Signed   By: Vinnie Langton M.D.   On: 07/08/2019 14:07   Impression:  Small slowly enlarging nodule in the left lower lobe which may represent slow-growing malignancy versus inflammatory process.  Over the past 2 years the maximum transverse diameter of this nodule has increased from 1.2 to 1.9 cm.  The low uptake noted on previous PET imaging could be consistent with either inflammatory process or slowly growing malignancy, such as adenocarcinoma.  The patient would not be considered a candidate for surgical resection.  Targeted SBRT could be considered, although  the nodule is probably not large enough for tissue diagnosis at this time.    Plan:  I discussed the results of the patient's CT scan performed today at length with the patient and his son.  We discussed the significant possibility that this nodule could represent a slowly growing malignancy.  Alternative treatment strategies been discussed including referral for radiation therapy with or without an attempt at tissue biopsy versus continued intermittent CT imaging for long-term follow-up.  At the request of the patient's son, we will stop follow-up imaging.  The patient expresses some degree of understanding and is agreeable with his son's plan.  We also discussed the possibility that the patient might benefit from iron supplementation due to the presence of chronic anemia which may or may not still be related to some degree of low level hemolysis.  All the questions have been addressed.  In the future the patient will call and return to see [place] here only should specific problems or questions arise.  I spent in excess of 10 minutes during the conduct of this office consultation and >50% of this time involved direct face-to-face encounter with the patient for counseling and/or coordination of their care.   Valentina Gu. Roxy Manns, MD 07/08/2019 3:19 PM

## 2019-07-12 DIAGNOSIS — I480 Paroxysmal atrial fibrillation: Secondary | ICD-10-CM | POA: Diagnosis not present

## 2019-07-12 DIAGNOSIS — I130 Hypertensive heart and chronic kidney disease with heart failure and stage 1 through stage 4 chronic kidney disease, or unspecified chronic kidney disease: Secondary | ICD-10-CM | POA: Diagnosis not present

## 2019-07-12 DIAGNOSIS — I69392 Facial weakness following cerebral infarction: Secondary | ICD-10-CM | POA: Diagnosis not present

## 2019-07-12 DIAGNOSIS — I69322 Dysarthria following cerebral infarction: Secondary | ICD-10-CM | POA: Diagnosis not present

## 2019-07-12 DIAGNOSIS — E1122 Type 2 diabetes mellitus with diabetic chronic kidney disease: Secondary | ICD-10-CM | POA: Diagnosis not present

## 2019-07-12 DIAGNOSIS — I2510 Atherosclerotic heart disease of native coronary artery without angina pectoris: Secondary | ICD-10-CM | POA: Diagnosis not present

## 2019-07-16 DIAGNOSIS — I2510 Atherosclerotic heart disease of native coronary artery without angina pectoris: Secondary | ICD-10-CM | POA: Diagnosis not present

## 2019-07-16 DIAGNOSIS — I480 Paroxysmal atrial fibrillation: Secondary | ICD-10-CM | POA: Diagnosis not present

## 2019-07-16 DIAGNOSIS — I130 Hypertensive heart and chronic kidney disease with heart failure and stage 1 through stage 4 chronic kidney disease, or unspecified chronic kidney disease: Secondary | ICD-10-CM | POA: Diagnosis not present

## 2019-07-16 DIAGNOSIS — I69392 Facial weakness following cerebral infarction: Secondary | ICD-10-CM | POA: Diagnosis not present

## 2019-07-16 DIAGNOSIS — I69322 Dysarthria following cerebral infarction: Secondary | ICD-10-CM | POA: Diagnosis not present

## 2019-07-16 DIAGNOSIS — E1122 Type 2 diabetes mellitus with diabetic chronic kidney disease: Secondary | ICD-10-CM | POA: Diagnosis not present

## 2019-07-17 DIAGNOSIS — I69392 Facial weakness following cerebral infarction: Secondary | ICD-10-CM | POA: Diagnosis not present

## 2019-07-17 DIAGNOSIS — E1122 Type 2 diabetes mellitus with diabetic chronic kidney disease: Secondary | ICD-10-CM | POA: Diagnosis not present

## 2019-07-17 DIAGNOSIS — I480 Paroxysmal atrial fibrillation: Secondary | ICD-10-CM | POA: Diagnosis not present

## 2019-07-17 DIAGNOSIS — I69322 Dysarthria following cerebral infarction: Secondary | ICD-10-CM | POA: Diagnosis not present

## 2019-07-17 DIAGNOSIS — I2510 Atherosclerotic heart disease of native coronary artery without angina pectoris: Secondary | ICD-10-CM | POA: Diagnosis not present

## 2019-07-17 DIAGNOSIS — I130 Hypertensive heart and chronic kidney disease with heart failure and stage 1 through stage 4 chronic kidney disease, or unspecified chronic kidney disease: Secondary | ICD-10-CM | POA: Diagnosis not present

## 2019-07-19 DIAGNOSIS — I130 Hypertensive heart and chronic kidney disease with heart failure and stage 1 through stage 4 chronic kidney disease, or unspecified chronic kidney disease: Secondary | ICD-10-CM | POA: Diagnosis not present

## 2019-07-19 DIAGNOSIS — I69392 Facial weakness following cerebral infarction: Secondary | ICD-10-CM | POA: Diagnosis not present

## 2019-07-19 DIAGNOSIS — I480 Paroxysmal atrial fibrillation: Secondary | ICD-10-CM | POA: Diagnosis not present

## 2019-07-19 DIAGNOSIS — I69322 Dysarthria following cerebral infarction: Secondary | ICD-10-CM | POA: Diagnosis not present

## 2019-07-19 DIAGNOSIS — I2510 Atherosclerotic heart disease of native coronary artery without angina pectoris: Secondary | ICD-10-CM | POA: Diagnosis not present

## 2019-07-19 DIAGNOSIS — E1122 Type 2 diabetes mellitus with diabetic chronic kidney disease: Secondary | ICD-10-CM | POA: Diagnosis not present

## 2019-07-23 DIAGNOSIS — I130 Hypertensive heart and chronic kidney disease with heart failure and stage 1 through stage 4 chronic kidney disease, or unspecified chronic kidney disease: Secondary | ICD-10-CM | POA: Diagnosis not present

## 2019-07-23 DIAGNOSIS — I69392 Facial weakness following cerebral infarction: Secondary | ICD-10-CM | POA: Diagnosis not present

## 2019-07-23 DIAGNOSIS — I69322 Dysarthria following cerebral infarction: Secondary | ICD-10-CM | POA: Diagnosis not present

## 2019-07-23 DIAGNOSIS — I480 Paroxysmal atrial fibrillation: Secondary | ICD-10-CM | POA: Diagnosis not present

## 2019-07-23 DIAGNOSIS — I2510 Atherosclerotic heart disease of native coronary artery without angina pectoris: Secondary | ICD-10-CM | POA: Diagnosis not present

## 2019-07-23 DIAGNOSIS — E1122 Type 2 diabetes mellitus with diabetic chronic kidney disease: Secondary | ICD-10-CM | POA: Diagnosis not present

## 2019-07-24 DIAGNOSIS — I2510 Atherosclerotic heart disease of native coronary artery without angina pectoris: Secondary | ICD-10-CM | POA: Diagnosis not present

## 2019-07-24 DIAGNOSIS — I69392 Facial weakness following cerebral infarction: Secondary | ICD-10-CM | POA: Diagnosis not present

## 2019-07-24 DIAGNOSIS — I130 Hypertensive heart and chronic kidney disease with heart failure and stage 1 through stage 4 chronic kidney disease, or unspecified chronic kidney disease: Secondary | ICD-10-CM | POA: Diagnosis not present

## 2019-07-24 DIAGNOSIS — E1122 Type 2 diabetes mellitus with diabetic chronic kidney disease: Secondary | ICD-10-CM | POA: Diagnosis not present

## 2019-07-24 DIAGNOSIS — I480 Paroxysmal atrial fibrillation: Secondary | ICD-10-CM | POA: Diagnosis not present

## 2019-07-24 DIAGNOSIS — I69322 Dysarthria following cerebral infarction: Secondary | ICD-10-CM | POA: Diagnosis not present

## 2019-07-26 ENCOUNTER — Ambulatory Visit: Payer: Medicare Other | Admitting: Cardiovascular Disease

## 2019-07-30 DIAGNOSIS — E1122 Type 2 diabetes mellitus with diabetic chronic kidney disease: Secondary | ICD-10-CM | POA: Diagnosis not present

## 2019-07-30 DIAGNOSIS — I2510 Atherosclerotic heart disease of native coronary artery without angina pectoris: Secondary | ICD-10-CM | POA: Diagnosis not present

## 2019-07-30 DIAGNOSIS — I69322 Dysarthria following cerebral infarction: Secondary | ICD-10-CM | POA: Diagnosis not present

## 2019-07-30 DIAGNOSIS — I480 Paroxysmal atrial fibrillation: Secondary | ICD-10-CM | POA: Diagnosis not present

## 2019-07-30 DIAGNOSIS — I130 Hypertensive heart and chronic kidney disease with heart failure and stage 1 through stage 4 chronic kidney disease, or unspecified chronic kidney disease: Secondary | ICD-10-CM | POA: Diagnosis not present

## 2019-07-30 DIAGNOSIS — I69392 Facial weakness following cerebral infarction: Secondary | ICD-10-CM | POA: Diagnosis not present

## 2019-08-02 ENCOUNTER — Other Ambulatory Visit: Payer: Self-pay

## 2019-08-02 ENCOUNTER — Other Ambulatory Visit: Payer: Medicare Other | Admitting: *Deleted

## 2019-08-02 DIAGNOSIS — Z952 Presence of prosthetic heart valve: Secondary | ICD-10-CM

## 2019-08-02 DIAGNOSIS — I2510 Atherosclerotic heart disease of native coronary artery without angina pectoris: Secondary | ICD-10-CM

## 2019-08-02 LAB — COMPREHENSIVE METABOLIC PANEL
ALT: 12 IU/L (ref 0–44)
AST: 32 IU/L (ref 0–40)
Albumin/Globulin Ratio: 2 (ref 1.2–2.2)
Albumin: 4.5 g/dL (ref 3.7–4.7)
Alkaline Phosphatase: 69 IU/L (ref 39–117)
BUN/Creatinine Ratio: 6 — ABNORMAL LOW (ref 10–24)
BUN: 16 mg/dL (ref 8–27)
Bilirubin Total: 0.8 mg/dL (ref 0.0–1.2)
CO2: 19 mmol/L — ABNORMAL LOW (ref 20–29)
Calcium: 9.5 mg/dL (ref 8.6–10.2)
Chloride: 107 mmol/L — ABNORMAL HIGH (ref 96–106)
Creatinine, Ser: 2.59 mg/dL — ABNORMAL HIGH (ref 0.76–1.27)
GFR calc Af Amer: 26 mL/min/{1.73_m2} — ABNORMAL LOW (ref >59)
GFR calc non Af Amer: 23 mL/min/{1.73_m2} — ABNORMAL LOW (ref >59)
Globulin, Total: 2.3 g/dL (ref 1.5–4.5)
Glucose: 107 mg/dL — ABNORMAL HIGH (ref 65–99)
Potassium: 5 mmol/L (ref 3.5–5.2)
Sodium: 140 mmol/L (ref 134–144)
Total Protein: 6.8 g/dL (ref 6.0–8.5)

## 2019-08-02 LAB — LIPID PANEL
Chol/HDL Ratio: 4.4 ratio (ref 0.0–5.0)
Cholesterol, Total: 153 mg/dL (ref 100–199)
HDL: 35 mg/dL — ABNORMAL LOW (ref >39)
LDL Chol Calc (NIH): 99 mg/dL (ref 0–99)
Triglycerides: 100 mg/dL (ref 0–149)
VLDL Cholesterol Cal: 19 mg/dL (ref 5–40)

## 2019-08-03 ENCOUNTER — Other Ambulatory Visit: Payer: Self-pay | Admitting: Hematology and Oncology

## 2019-08-03 DIAGNOSIS — Z9181 History of falling: Secondary | ICD-10-CM | POA: Diagnosis not present

## 2019-08-03 DIAGNOSIS — T8203XS Leakage of heart valve prosthesis, sequela: Secondary | ICD-10-CM | POA: Diagnosis not present

## 2019-08-03 DIAGNOSIS — D573 Sickle-cell trait: Secondary | ICD-10-CM | POA: Diagnosis not present

## 2019-08-03 DIAGNOSIS — I5020 Unspecified systolic (congestive) heart failure: Secondary | ICD-10-CM | POA: Diagnosis not present

## 2019-08-03 DIAGNOSIS — N1832 Chronic kidney disease, stage 3b: Secondary | ICD-10-CM | POA: Diagnosis not present

## 2019-08-03 DIAGNOSIS — I2510 Atherosclerotic heart disease of native coronary artery without angina pectoris: Secondary | ICD-10-CM | POA: Diagnosis not present

## 2019-08-03 DIAGNOSIS — D598 Other acquired hemolytic anemias: Secondary | ICD-10-CM | POA: Diagnosis not present

## 2019-08-03 DIAGNOSIS — E1122 Type 2 diabetes mellitus with diabetic chronic kidney disease: Secondary | ICD-10-CM | POA: Diagnosis not present

## 2019-08-03 DIAGNOSIS — I69392 Facial weakness following cerebral infarction: Secondary | ICD-10-CM | POA: Diagnosis not present

## 2019-08-03 DIAGNOSIS — Z950 Presence of cardiac pacemaker: Secondary | ICD-10-CM | POA: Diagnosis not present

## 2019-08-03 DIAGNOSIS — I69322 Dysarthria following cerebral infarction: Secondary | ICD-10-CM | POA: Diagnosis not present

## 2019-08-03 DIAGNOSIS — I130 Hypertensive heart and chronic kidney disease with heart failure and stage 1 through stage 4 chronic kidney disease, or unspecified chronic kidney disease: Secondary | ICD-10-CM | POA: Diagnosis not present

## 2019-08-03 DIAGNOSIS — I480 Paroxysmal atrial fibrillation: Secondary | ICD-10-CM | POA: Diagnosis not present

## 2019-08-03 DIAGNOSIS — Z7902 Long term (current) use of antithrombotics/antiplatelets: Secondary | ICD-10-CM | POA: Diagnosis not present

## 2019-08-06 DIAGNOSIS — I69322 Dysarthria following cerebral infarction: Secondary | ICD-10-CM | POA: Diagnosis not present

## 2019-08-06 DIAGNOSIS — I480 Paroxysmal atrial fibrillation: Secondary | ICD-10-CM | POA: Diagnosis not present

## 2019-08-06 DIAGNOSIS — I130 Hypertensive heart and chronic kidney disease with heart failure and stage 1 through stage 4 chronic kidney disease, or unspecified chronic kidney disease: Secondary | ICD-10-CM | POA: Diagnosis not present

## 2019-08-06 DIAGNOSIS — I2510 Atherosclerotic heart disease of native coronary artery without angina pectoris: Secondary | ICD-10-CM | POA: Diagnosis not present

## 2019-08-06 DIAGNOSIS — E1122 Type 2 diabetes mellitus with diabetic chronic kidney disease: Secondary | ICD-10-CM | POA: Diagnosis not present

## 2019-08-06 DIAGNOSIS — I69392 Facial weakness following cerebral infarction: Secondary | ICD-10-CM | POA: Diagnosis not present

## 2019-08-12 ENCOUNTER — Inpatient Hospital Stay: Payer: Medicare Other | Admitting: Adult Health

## 2019-08-12 NOTE — Progress Notes (Deleted)
[facility] [address] [place]. [place] [address] [phone]       HOSPITAL FOLLOW UP NOTE  [name] Date of Birth:  [birth year] Medical Record Number:  [id number]   Reason for Referral:  hospital stroke follow up    CHIEF COMPLAINT:  No chief complaint on file.   HPI: James Curtis being seen today for in office hospital follow-up regarding right paramedian pontine infarct secondary to small vessel disease on 06/12/2019.  History obtained from *** and chart review. Reviewed all radiology images and labs personally.  James Curtis is a 78 y.o. male with history of TIA, stroke, status post resection of left atrial mass, status post aortic valve replacement with bio prosthetic valve, perivalvular leak with prosthetic valve, mitral valve mass, left atrial mass, hypertension, hypercholesterolemia, dementia, complete heart block, carotid artery disease, cardiomyopathy, and aortic stenosis  presented on 06/12/2019 with recurrent falls over the past 2 to 3 days prior to hospitalization with findings of L facial droop and dysarthria.  He was evaluated by Dr. Leonie Man and stroke team with stroke work-up revealing right paramedian pontine infarct as evidenced on MRI secondary to small vessel disease source.  MRA negative for LVO but did show moderate stenosis origin right ACA versus artifact, mild intracranial ICA arthrosclerosis, and right ICA supraclinoid aneurysm versus infundibulum.  2D echo EF of 55 to 60% without cardiac source of embolus identified.  Previously on aspirin and recommended DAPT for 3 weeks then Plavix alone.  History of atrial fibrillation with patient declining long-term AC.  HTN stable.  LDL 114 and recommended initiating atorvastatin 40 mg daily.  Controlled DM with A1c 4.6.  Other stroke risk factors include advanced age, former tobacco use, obesity, prior history of TIA, possible OSA and recommended evaluation outpatient, aortic stenosis  status post replacement, resection LA mass, history of MV mass and cardiomyopathy.  Other active problems include baseline dementia.  He was discharged home in stable condition recommendation home health therapy.      ROS:   14 system review of systems performed and negative with exception of ***  PMH:  Past Medical History:  Diagnosis Date  . Aortic stenosis    a. 11/20/2016 Echo: mod AS;  b. 11/23/2016 TEE restricted mobility of noncoronary cusp;  c. 11/2016 Cath: mean grad 90mHg w/ dual lumen catheter, AVA 2.0cm^2; mean gradient by pullback = 139mg, AVA 2.3cm^2.  . Cardiomyopathy ([facility]   Echo 6/18: EF 40-45, inf/inf-septal HK-AK, Gr 1 DD, severe LVH, MAC, severe LAE, mildly reduced RVSF  . Carotid arterial disease ([facility]   a. 11/2016 Carotid U/S: bilat 1-39% ICA stenosis.  . Chronic kidney disease   . Complete heart block ([facility]   a. 11/21/2016 s/p MDT AzSantina EvansR MRI SureScan (ser # [id number]).  . Dementia ([facility]   mild  . Dementia ([facility]  . Family history of adverse reaction to anesthesia    Son [name] a difficult time waking up  . Heart murmur   . Hemolytic anemia ([facility]  . Hypercholesteremia   . Hypertension    benign  . Incidental pulmonary nodule, greater than or equal to 51m66m needs f/u scan 3 months 11/28/2016   14 x 8 mm (mean diameter of 11 mm) left lower lobe pulmonary nodule with some surrounding ground-glass attenuation (axial image 42 of series 6). 9 x 5 mm (mean diameter of 7 mm) subpleural nodule in the periphery of the right upper lobe (axial  image 20 of series 6). A few patchy areas of peripheral predominant ground-glass attenuation and subpleural reticulation are noted, scattered randomly throu  . Left atrial mass   . 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 11/2009 TEE: EF 60-65%, restricted mobility of noncoronary AoV cusp, 16x4 mm mobile Ca2+ echodensity on the atrial basal surface of the posterior MV leaflet, mild MR, no LA/LAA thrombus.  .  Non-obstructive CAD (coronary artery disease)    a. 11/2016 Cath: LM large, nl, LAD min irregs, LCX large, min irregs, OM3 40, RCA large, 85m   . Paravalvular leak (prosthetic valve)    Echo 9/19: Severe concentric LVH, EF 55-60, normal wall motion, grade 2 diastolic dysfunction, bioprosthetic AVR with mild to moderate perivalvular regurgitation, moderate to severely calcified mitral valve annulus, severe LAE, mild to moderate TR, PASP 42  . Paravalvular leak (prosthetic valve), subsequent encounter   . Pre-diabetes   . Prostate cancer ([facility]    s/p radiation  . Renal mass of unknown nature - needs f/u MRI 11/28/2016   Multiple renal lesions are noted in the kidneys bilaterally. Although several of these lesions are low-attenuation, compatible with cysts, other lesions are intermediate to high attenuation, potentially enhancing  . S/P aortic valve replacement with bioprosthetic valve 12/02/2016   23 mm Edwards Intuity rapid deployment bovine pericardial tissue valve  . S/P resection of left atrial mass 12/02/2016   Benign dystrophic fibrotic tissue with calcification attached to posterior mitral annulus by thin fibrotic stalk  . Stroke ([facility]   . TIA (transient ischemic attack)    a. 11/2016 in setting of complete heart block.    PSH:  Past Surgical History:  Procedure Laterality Date  . AORTIC VALVE REPLACEMENT N/A 12/02/2016   Procedure: AORTIC VALVE REPLACEMENT (AVR) using a 23 Edwards Intuity Elite Aortic Valve;  Surgeon: CRexene Alberts MD;  [place] [facility]  Service: Open Heart Surgery;  Laterality: N/A;  . EXCISION OF ATRIAL MYXOMA N/A 12/02/2016   Procedure: Resection of left atrial mass ;  Surgeon: CRexene Alberts MD;  [place] [facility]  Service: Open Heart Surgery;  Laterality: N/A;  . PACEMAKER IMPLANT N/A 11/21/2016   Procedure: Pacemaker Implant;  Surgeon: Will MMeredith Leeds MD;  [facility];  Service: Cardiovascular;  Laterality: N/A;  . RIGHT/LEFT HEART CATH AND  CORONARY ANGIOGRAPHY N/A 11/25/2016   Procedure: Right/Left Heart Cath and Coronary Angiography;  Surgeon: CNelva Bush MD;  [place];  Service: Cardiovascular;  Laterality: N/A;  . TEE WITHOUT CARDIOVERSION N/A 11/23/2016   Procedure: TRANSESOPHAGEAL ECHOCARDIOGRAM (TEE);  Surgeon: MJerline Pain MD;  [place] [facility]  Service: Cardiovascular;  Laterality: N/A;  . TEE WITHOUT CARDIOVERSION N/A 12/02/2016   Procedure: TRANSESOPHAGEAL ECHOCARDIOGRAM (TEE);  Surgeon: CRexene Alberts MD;  [place] [facility]  Service: Open Heart Surgery;  Laterality: N/A;  . TEE WITHOUT CARDIOVERSION N/A 08/22/2018   Procedure: TRANSESOPHAGEAL ECHOCARDIOGRAM (TEE);  Surgeon: NJosue Hector MD;  [facility];  Service: Cardiovascular;  Laterality: N/A;    Social History:  Social History   Socioeconomic History  . Marital status: Widowed    Spouse name: Not on file  . Number of children: 2  . Years of education: 172 . Highest education level: Not on file  Occupational History    Comment: retired  Tobacco Use  . Smoking status: Former Smoker    Packs/day: 0.25    Types: Cigarettes    Quit date:  11/16/2016    Years since quitting: 2.7  . Smokeless tobacco: Never Used  Substance and Sexual Activity  . Alcohol use: No  . Drug use: No  . Sexual activity: Not Currently  Other Topics Concern  . Not on file  Social History Narrative   Lives alone   Caffeine    Social Determinants of Health   Financial Resource Strain:   . Difficulty of Paying Living Expenses: Not on file  Food Insecurity:   . Worried About Charity fundraiser in the Last Year: Not on file  . Ran Out of Food in the Last Year: Not on file  Transportation Needs:   . Lack of Transportation (Medical): Not on file  . Lack of Transportation (Non-Medical): Not on file  Physical Activity:   . Days of Exercise per Week: Not on file  . Minutes of Exercise per Session: Not on file  Stress:   . Feeling of  Stress : Not on file  Social Connections:   . Frequency of Communication with Friends and Family: Not on file  . Frequency of Social Gatherings with Friends and Family: Not on file  . Attends Religious Services: Not on file  . Active Member of Clubs or Organizations: Not on file  . Attends Archivist Meetings: Not on file  . Marital Status: Not on file  Intimate Partner Violence:   . Fear of Current or Ex-Partner: Not on file  . Emotionally Abused: Not on file  . Physically Abused: Not on file  . Sexually Abused: Not on file    Family History:  Family History  Problem Relation Age of Onset  . Cancer Father     Medications:   Current Outpatient Medications on File Prior to Visit  Medication Sig Dispense Refill  . amLODipine (NORVASC) 10 MG tablet Take 10 mg by mouth daily.  11  . amoxicillin (AMOXIL) 500 MG tablet Take 4 tablets (2 grams) by mouth 30-60 minutes prior to dental visit. 12 tablet 1  . atorvastatin (LIPITOR) 40 MG tablet Take 1 tablet (40 mg total) by mouth daily at 6 PM. 30 tablet 0  . Capsicum, Cayenne, (CAYENNE PEPPER PO) Take 1 capsule by mouth daily. Takes 1 tablet po as directed. This is a complex tablet along with ginko biloba     . carvedilol (COREG) 6.25 MG tablet TAKE 1 TABLET BY MOUTH TWICE A DAY 180 tablet 2  . cholecalciferol (VITAMIN D) 1000 units tablet Take 1,000 Units by mouth daily.    . folic acid (FOLVITE) 1 MG tablet TAKE 3 TABLETS BY MOUTH EVERY DAY 270 tablet 1  . Ginkgo Biloba 40 MG TABS Take by mouth. Unsure dose. Complex tablet along with cayenne pepper. Takes as directed.    Marland Kitchen losartan (COZAAR) 100 MG tablet Take 1 tablet (100 mg total) by mouth daily. 90 tablet 3  . Omega-3 Fatty Acids (OMEGA 3 PO) Take 1 capsule by mouth daily.     Lia Hopping Leaf POWD Take 3 capsules by mouth daily.     . Tetrahydrozoline HCl (VISINE OP) Place 1 drop into both eyes daily as needed (dry , itchy eyes).      No current facility-administered medications  on file prior to visit.    Allergies:  No Known Allergies   Physical Exam  There were no vitals filed for this visit. There is no height or weight on file to calculate BMI. No exam data present  No flowsheet data found.  General: well developed, well nourished, seated, in no evident distress Head: head normocephalic and atraumatic.   Neck: supple with no carotid or supraclavicular bruits Cardiovascular: regular rate and rhythm, no murmurs Musculoskeletal: no deformity Skin:  no rash/petichiae Vascular:  Normal pulses all extremities   Neurologic Exam Mental Status: Awake and fully alert. Oriented to place and time. Recent and remote memory intact. Attention span, concentration and fund of knowledge appropriate. Mood and affect appropriate.  Cranial Nerves: Fundoscopic exam reveals sharp disc margins. Pupils equal, briskly reactive to light. Extraocular movements full without nystagmus. Visual fields full to confrontation. Hearing intact. Facial sensation intact. Face, tongue, palate moves normally and symmetrically.  Motor: Normal bulk and tone. Normal strength in all tested extremity muscles. Sensory.: intact to touch , pinprick , position and vibratory sensation.  Coordination: Rapid alternating movements normal in all extremities. Finger-to-nose and heel-to-shin performed accurately bilaterally. Gait and Station: Arises from chair without difficulty. Stance is normal. Gait demonstrates normal stride length and balance Reflexes: 1+ and symmetric. Toes downgoing.     NIHSS  *** Modified Rankin  *** CHA2DS2-VASc 8 HAS-BLED ***   Diagnostic Data (Labs, Imaging, Testing)  CT HEAD WO CONTRAST 06/10/2019 IMPRESSION: Chronic small vessel disease without acute intracranial abnormality.  CT ANGIO HEAD W OR WO CONTRAST CT ANGIO NECK W OR WO CONTRAST ***  MR BRAIN WO CONTRAST 06/11/2019 IMPRESSION: Acute infarction affecting the right para median pons. No swelling or  hemorrhage. Mild to moderate chronic small-vessel ischemic changes elsewhere affecting the pons and the cerebral hemispheric white matter.  MR MRA HEAD  06/12/2019 IMPRESSION: 1. No intracranial large vessel occlusion. 2. Moderate focal stenosis at the origin of the right anterior cerebral artery versus artifact from vessel tortuosity. Otherwise, no significant proximal stenosis. Mild atherosclerotic disease within the intracranial internal carotid arteries. 3. 2 mm in inferiorly projecting aneurysm versus infundibulum arising from the supraclinoid right ICA.  ECHOCARDIOGRAM 06/12/2019 IMPRESSIONS  1. Left ventricular ejection fraction, by visual estimation, is 55 to 60%. The left ventricle has normal function. Left ventricular septal wall thickness was moderately increased. There is moderately increased left ventricular hypertrophy.  2. Left ventricular diastolic parameters are consistent with Grade I diastolic dysfunction (impaired relaxation).  3. Global right ventricle has normal systolic function.The right ventricular size is normal. No increase in right ventricular wall thickness.  4. Left atrial size was severely dilated.  5. Right atrial size was normal.  6. Severe mitral annular calcification.  7. The mitral valve is normal in structure. Mild mitral valve regurgitation. No evidence of mitral stenosis.  8. The tricuspid valve is normal in structure. Tricuspid valve regurgitation is mild.  9. Aortic valve regurgitation is mild to moderate. 10. The aortic valve is normal in structure. Aortic valve regurgitation is mild to moderate. No evidence of aortic valve sclerosis or stenosis. 11. Perivalvular regurgitation. 12. The pulmonic valve was normal in structure. Pulmonic valve regurgitation is trivial. 13. Normal pulmonary artery systolic pressure. 14. A pacer wire is visualized. 15. The inferior vena cava is normal in size with greater than 50% respiratory variability, suggesting  right atrial pressure of 3 mmHg.    ASSESSMENT: James Curtis is a 78 y.o. year old male presented with recurrent falls, left facial droop and dysarthria on 06/10/2019 with stroke work-up revealing right paramedian pontine infarct secondary to small vessel disease. Vascular risk factors include prior history of TIA, HTN, HLD, DM, former tobacco use, atrial fibrillation declining AC, history of prostate cancer s/p radiation, obesity, possible  OSA, aortic stenosis status post replacement, resection LA mass, history MV mass and cardiomyopathy.  Also noted for baseline dementia.    PLAN:  1. Right paramedian pontine infarct: Continue {anticoagulants:31417}  and atorvastatin for secondary stroke prevention. Maintain strict control of hypertension with blood pressure goal below 130/90, diabetes with hemoglobin A1c goal below 6.5% and cholesterol with LDL cholesterol (bad cholesterol) goal below 70 mg/dL.  I also advised the patient to eat a healthy diet with plenty of whole grains, cereals, fruits and vegetables, exercise regularly with at least 30 minutes of continuous activity daily and maintain ideal body weight. 2. HTN: Advised to continue current treatment regimen.  Today's BP ***.  Advised to continue to monitor at home along with continued follow-up with PCP for management 3. HLD: Advised to continue current treatment regimen along with continued follow-up with PCP for future prescribing and monitoring of lipid panel 4. DMII: Advised to continue to monitor glucose levels at home along with continued follow-up with PCP for management and monitoring 5. Atrial fibrillation: Continues to follow with cardiology for ongoing monitoring and management.  Declines long-term AC 6.     Follow up in *** or call earlier if needed   Greater than 50% of time during this 45 minute visit was spent on counseling, explanation of diagnosis of ***, reviewing risk factor management of ***, planning of further  management along with potential future management, and discussion with patient and family answering all questions.    Frann Rider, AGNP-BC  [facility] [address] [place], [place] [address]  Phone [phone] Fax [phone] Note: This document was prepared with digital dictation and possible smart phrase technology. Any transcriptional errors that result from this process are unintentional.

## 2019-08-13 DIAGNOSIS — I130 Hypertensive heart and chronic kidney disease with heart failure and stage 1 through stage 4 chronic kidney disease, or unspecified chronic kidney disease: Secondary | ICD-10-CM | POA: Diagnosis not present

## 2019-08-13 DIAGNOSIS — I2510 Atherosclerotic heart disease of native coronary artery without angina pectoris: Secondary | ICD-10-CM | POA: Diagnosis not present

## 2019-08-13 DIAGNOSIS — I69322 Dysarthria following cerebral infarction: Secondary | ICD-10-CM | POA: Diagnosis not present

## 2019-08-13 DIAGNOSIS — E1122 Type 2 diabetes mellitus with diabetic chronic kidney disease: Secondary | ICD-10-CM | POA: Diagnosis not present

## 2019-08-13 DIAGNOSIS — I69392 Facial weakness following cerebral infarction: Secondary | ICD-10-CM | POA: Diagnosis not present

## 2019-08-13 DIAGNOSIS — I480 Paroxysmal atrial fibrillation: Secondary | ICD-10-CM | POA: Diagnosis not present

## 2019-08-14 ENCOUNTER — Encounter: Payer: Self-pay | Admitting: Adult Health

## 2019-08-21 ENCOUNTER — Ambulatory Visit (INDEPENDENT_AMBULATORY_CARE_PROVIDER_SITE_OTHER): Payer: Medicare Other | Admitting: *Deleted

## 2019-08-21 DIAGNOSIS — I480 Paroxysmal atrial fibrillation: Secondary | ICD-10-CM | POA: Diagnosis not present

## 2019-08-22 LAB — CUP PACEART REMOTE DEVICE CHECK
Battery Remaining Longevity: 90 mo
Battery Voltage: 2.98 V
Brady Statistic AP VP Percent: 81.98 %
Brady Statistic AP VS Percent: 0 %
Brady Statistic AS VP Percent: 17.98 %
Brady Statistic AS VS Percent: 0.04 %
Brady Statistic RA Percent Paced: 81.96 %
Brady Statistic RV Percent Paced: 99.96 %
Date Time Interrogation Session: 20210106001750
Implantable Lead Implant Date: 20180409
Implantable Lead Implant Date: 20180409
Implantable Lead Location: 753859
Implantable Lead Location: 753860
Implantable Lead Model: 5076
Implantable Lead Model: 5076
Implantable Pulse Generator Implant Date: 20180409
Lead Channel Impedance Value: 266 Ohm
Lead Channel Impedance Value: 304 Ohm
Lead Channel Impedance Value: 323 Ohm
Lead Channel Impedance Value: 361 Ohm
Lead Channel Pacing Threshold Amplitude: 0.5 V
Lead Channel Pacing Threshold Amplitude: 0.625 V
Lead Channel Pacing Threshold Pulse Width: 0.4 ms
Lead Channel Pacing Threshold Pulse Width: 0.4 ms
Lead Channel Sensing Intrinsic Amplitude: 1 mV
Lead Channel Sensing Intrinsic Amplitude: 1 mV
Lead Channel Sensing Intrinsic Amplitude: 4.5 mV
Lead Channel Sensing Intrinsic Amplitude: 4.5 mV
Lead Channel Setting Pacing Amplitude: 2 V
Lead Channel Setting Pacing Amplitude: 2.5 V
Lead Channel Setting Pacing Pulse Width: 0.4 ms
Lead Channel Setting Sensing Sensitivity: 2.8 mV

## 2019-09-27 ENCOUNTER — Ambulatory Visit: Payer: PRIVATE HEALTH INSURANCE | Admitting: Cardiovascular Disease

## 2019-11-20 ENCOUNTER — Ambulatory Visit (INDEPENDENT_AMBULATORY_CARE_PROVIDER_SITE_OTHER): Payer: Medicare Other | Admitting: *Deleted

## 2019-11-20 DIAGNOSIS — I480 Paroxysmal atrial fibrillation: Secondary | ICD-10-CM | POA: Diagnosis not present

## 2019-11-20 LAB — CUP PACEART REMOTE DEVICE CHECK
Battery Remaining Longevity: 89 mo
Battery Voltage: 2.98 V
Brady Statistic AP VP Percent: 74.6 %
Brady Statistic AP VS Percent: 0.04 %
Brady Statistic AS VP Percent: 25.06 %
Brady Statistic AS VS Percent: 0.3 %
Brady Statistic RA Percent Paced: 71.6 %
Brady Statistic RV Percent Paced: 99.67 %
Date Time Interrogation Session: 20210407011858
Implantable Lead Implant Date: 20180409
Implantable Lead Implant Date: 20180409
Implantable Lead Location: 753859
Implantable Lead Location: 753860
Implantable Lead Model: 5076
Implantable Lead Model: 5076
Implantable Pulse Generator Implant Date: 20180409
Lead Channel Impedance Value: 228 Ohm
Lead Channel Impedance Value: 304 Ohm
Lead Channel Impedance Value: 361 Ohm
Lead Channel Impedance Value: 399 Ohm
Lead Channel Pacing Threshold Amplitude: 0.625 V
Lead Channel Pacing Threshold Amplitude: 1 V
Lead Channel Pacing Threshold Pulse Width: 0.4 ms
Lead Channel Pacing Threshold Pulse Width: 0.4 ms
Lead Channel Sensing Intrinsic Amplitude: 1.25 mV
Lead Channel Sensing Intrinsic Amplitude: 1.25 mV
Lead Channel Sensing Intrinsic Amplitude: 15.625 mV
Lead Channel Sensing Intrinsic Amplitude: 15.625 mV
Lead Channel Setting Pacing Amplitude: 2 V
Lead Channel Setting Pacing Amplitude: 2.5 V
Lead Channel Setting Pacing Pulse Width: 0.4 ms
Lead Channel Setting Sensing Sensitivity: 2.8 mV

## 2019-11-20 NOTE — Progress Notes (Signed)
PPM Remote  

## 2020-02-19 ENCOUNTER — Ambulatory Visit (INDEPENDENT_AMBULATORY_CARE_PROVIDER_SITE_OTHER): Payer: Medicare Other | Admitting: *Deleted

## 2020-02-19 DIAGNOSIS — I428 Other cardiomyopathies: Secondary | ICD-10-CM | POA: Diagnosis not present

## 2020-02-19 LAB — CUP PACEART REMOTE DEVICE CHECK
Battery Remaining Longevity: 84 mo
Battery Voltage: 2.98 V
Brady Statistic AP VP Percent: 76.95 %
Brady Statistic AP VS Percent: 0.02 %
Brady Statistic AS VP Percent: 22.82 %
Brady Statistic AS VS Percent: 0.21 %
Brady Statistic RA Percent Paced: 77.02 %
Brady Statistic RV Percent Paced: 99.77 %
Date Time Interrogation Session: 20210707011913
Implantable Lead Implant Date: 20180409
Implantable Lead Implant Date: 20180409
Implantable Lead Location: 753859
Implantable Lead Location: 753860
Implantable Lead Model: 5076
Implantable Lead Model: 5076
Implantable Pulse Generator Implant Date: 20180409
Lead Channel Impedance Value: 228 Ohm
Lead Channel Impedance Value: 266 Ohm
Lead Channel Impedance Value: 342 Ohm
Lead Channel Impedance Value: 399 Ohm
Lead Channel Pacing Threshold Amplitude: 0.75 V
Lead Channel Pacing Threshold Amplitude: 1 V
Lead Channel Pacing Threshold Pulse Width: 0.4 ms
Lead Channel Pacing Threshold Pulse Width: 0.4 ms
Lead Channel Sensing Intrinsic Amplitude: 1.125 mV
Lead Channel Sensing Intrinsic Amplitude: 1.125 mV
Lead Channel Sensing Intrinsic Amplitude: 15.625 mV
Lead Channel Sensing Intrinsic Amplitude: 15.625 mV
Lead Channel Setting Pacing Amplitude: 2 V
Lead Channel Setting Pacing Amplitude: 2.5 V
Lead Channel Setting Pacing Pulse Width: 0.4 ms
Lead Channel Setting Sensing Sensitivity: 2.8 mV

## 2020-02-25 ENCOUNTER — Telehealth: Payer: Self-pay | Admitting: Cardiology

## 2020-02-25 NOTE — Telephone Encounter (Signed)
James Curtis is calling stating he needs to speak with Dr. Curt Bears about a personal medical matter and stated he does not want a nurse to callback. Please advise.

## 2020-03-04 ENCOUNTER — Telehealth: Payer: Self-pay

## 2020-03-04 NOTE — Telephone Encounter (Signed)
[facility] received for Multiple alerts for RA/RV lead polarity switch/impedance. 10 VT events show noise RA/RV on 03/02/20. Presenting EGM's shows no signal on RA or RV EGM's.   Spoke to patients son James Curtis). James Curtis expressed frustration he thought Dr. Curt Bears was returning his phone call from last week. James Curtis advised that patient passed away 03/07/20. States he was cremated, returned patients ashes and device back to their house today. Wyona Almas that since pacemaker was near the box it could have sent a transmission update?. He has several questions to ask Dr. Curt Bears and would like a returned phone call from Dr. Curt Bears. I answered what questions I could to the best of my ability. Expressed concerns and apologized for patients passing.   Will forward this information to Dr. Curt Bears.

## 2020-03-05 NOTE — Telephone Encounter (Signed)
Dr. Curt Bears did speak w/ son today.

## 2020-03-12 NOTE — Telephone Encounter (Signed)
Spoke to the patient's son and discussed with him his father's pacemaker interrogation.  The patient died a few weeks ago.  No evidence on his pacemaker interrogation of arrhythmia.  Patient son thanked me for the information.

## 2020-03-15 DIAGNOSIS — 419620001 Death: Secondary | SNOMED CT | POA: Diagnosis not present

## 2020-03-15 NOTE — Progress Notes (Signed)
Remote pacemaker transmission.   

## 2020-03-15 DEATH — deceased
